# Patient Record
Sex: Male | Born: 1965 | Hispanic: Yes | Marital: Married | Smoking: Never smoker
Health system: Southern US, Community
[De-identification: ages and names within clinical notes are randomized; demographics above are authoritative.]

## PROBLEM LIST (undated history)

## (undated) DIAGNOSIS — I272 Pulmonary hypertension, unspecified: Secondary | ICD-10-CM

## (undated) DIAGNOSIS — E871 Hypo-osmolality and hyponatremia: Secondary | ICD-10-CM

## (undated) DIAGNOSIS — Z5111 Encounter for antineoplastic chemotherapy: Secondary | ICD-10-CM

## (undated) DIAGNOSIS — R918 Other nonspecific abnormal finding of lung field: Secondary | ICD-10-CM

## (undated) DIAGNOSIS — C419 Malignant neoplasm of bone and articular cartilage, unspecified: Secondary | ICD-10-CM

## (undated) DIAGNOSIS — I314 Cardiac tamponade: Secondary | ICD-10-CM

## (undated) DIAGNOSIS — Z923 Personal history of irradiation: Secondary | ICD-10-CM

## (undated) DIAGNOSIS — I313 Pericardial effusion (noninflammatory): Secondary | ICD-10-CM

## (undated) DIAGNOSIS — C3490 Malignant neoplasm of unspecified part of unspecified bronchus or lung: Secondary | ICD-10-CM

## (undated) DIAGNOSIS — E119 Type 2 diabetes mellitus without complications: Secondary | ICD-10-CM

## (undated) DIAGNOSIS — C7951 Secondary malignant neoplasm of bone: Secondary | ICD-10-CM

## (undated) DIAGNOSIS — E46 Unspecified protein-calorie malnutrition: Secondary | ICD-10-CM

## (undated) HISTORY — DX: Encounter for antineoplastic chemotherapy: Z51.11

---

## 2009-07-09 ENCOUNTER — Emergency Department (HOSPITAL_COMMUNITY): Admission: EM | Admit: 2009-07-09 | Discharge: 2009-07-09 | Payer: Self-pay | Admitting: Emergency Medicine

## 2014-10-13 ENCOUNTER — Ambulatory Visit (INDEPENDENT_AMBULATORY_CARE_PROVIDER_SITE_OTHER): Payer: Self-pay | Admitting: Internal Medicine

## 2014-10-13 ENCOUNTER — Encounter: Payer: Self-pay | Admitting: Internal Medicine

## 2014-10-13 ENCOUNTER — Other Ambulatory Visit: Payer: Self-pay

## 2014-10-13 VITALS — BP 100/58 | HR 64 | Temp 98.0°F | Ht 68.0 in | Wt 193.6 lb

## 2014-10-13 DIAGNOSIS — R918 Other nonspecific abnormal finding of lung field: Secondary | ICD-10-CM | POA: Insufficient documentation

## 2014-10-13 HISTORY — DX: Other nonspecific abnormal finding of lung field: R91.8

## 2014-10-13 NOTE — Progress Notes (Signed)
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    10/13/2014 1st [facility] office visit/ Steven Roberts   Chief Complaint  Patient presents with  . Pulmonary Consult    Referred by Dr. Herbert Moors. Pt c/o cough x 5 month- non prod. He went to ED at [facility] this am due to left side CP and was dxed with PNA. He also c/o SOB when he lies down.   onset of symptoms was insidious, pattern is progressive dry cough and then cp first post then ant on L assoc with about 15 lb wt loss  Prev neg resp to rx for gerd per outpt records   No obvious other patterns in day to day or daytime variabilty or assoc sob or chest tightness, subjective wheeze overt sinus or hb symptoms. No unusual exp hx or h/o childhood pna/ asthma or knowledge of premature birth.  Sleeping ok without nocturnal  or early am exacerbation  of respiratory  c/o's or need for noct saba. Also denies any obvious fluctuation of symptoms with weather or environmental changes or other aggravating or alleviating factors except as outlined above   Current Medications, Allergies, Complete Past Medical History, Past Surgical History, Family History, and Social History were reviewed in [employer] record.             Review of Systems  Constitutional: Negative for fever, chills, activity change, appetite change and unexpected weight change.  HENT: Positive for sore throat. Negative for congestion, dental problem, postnasal drip, rhinorrhea, sneezing, trouble swallowing and voice change.   Eyes: Negative for visual  disturbance.  Respiratory: Positive for cough and shortness of breath. Negative for choking.   Cardiovascular: Positive for chest pain. Negative for leg swelling.  Gastrointestinal: Negative for nausea, vomiting and abdominal pain.  Genitourinary: Negative for difficulty urinating.  Musculoskeletal: Negative for arthralgias.  Skin: Negative for rash.  Psychiatric/Behavioral: Negative for behavioral problems and confusion.       Objective:   Physical Exam   amb [place] male nad  Wt Readings from Last 3 Encounters:  10/13/14 193 lb 9.6 oz (87.816 kg)    Vital signs reviewed    HEENT: nl dentition, turbinates, and orophanx. Nl external ear canals without cough reflex   NECK :  without JVD/Nodes/TM/ nl carotid upstrokes bilaterally   LUNGS: no acc muscle use, clear to A and P bilaterally without cough on insp or exp maneuvers   CV:  RRR  no s3 or murmur or increase in P2, no edema   ABD:  soft and nontender with nl excursion in the supine position. No bruits or organomegaly, bowel sounds nl  MS:  warm without deformities, calf tenderness, cyanosis or clubbing  SKIN: warm and dry without lesions    NEURO:  alert, approp, no deficits     See pulmonary infiltrates for outside cxr / CT reports reviewed s discs avail at [facility]  Assessment & Plan:

## 2014-10-13 NOTE — Patient Instructions (Addendum)
Please remember to go to the lab  department downstairs for your tests - we will call you with the results when they are available.  Finish the zmax/ take the pain pill will also work as a cough medication   Return in one week with CD from [place]

## 2014-10-14 ENCOUNTER — Encounter: Payer: Self-pay | Admitting: Internal Medicine

## 2014-10-14 NOTE — Assessment & Plan Note (Addendum)
It is difficult to believe that he could have a neoplasm arise in an area where he had a normal chest x-ray in June of this year and he is a never smoker. My concern is that he is developed tuberculosis in the upper lobe and needs first to rule this out. Since he does not have any sputum production the best option therefore is to do a QuantiFERON Gold now and if positive refer to the health department. If not, proceed with bronchoscopy.  In the interim is certainly reasonable to suppress the cough and chest pain with narcotic containing cough medications  Discussed in detail all the  indications, usual  risks and alternatives  relative to the benefits with patient who agrees to proceed with w/u as outlined and need to wear mask in public until we sort this out

## 2014-10-15 LAB — QUANTIFERON TB GOLD ASSAY (BLOOD)
INTERFERON GAMMA RELEASE ASSAY: NEGATIVE
Mitogen value: >10 IU/mL
QUANTIFERON TB AG MINUS NIL: -0.01 [IU]/mL
Quantiferon Nil Value: 0.03 IU/mL
TB AG VALUE: 0.02 [IU]/mL

## 2014-10-16 NOTE — Progress Notes (Signed)
Quick Note:  LVM for pt to return call ______ 

## 2014-10-20 ENCOUNTER — Encounter: Payer: Self-pay | Admitting: Internal Medicine

## 2014-10-20 ENCOUNTER — Ambulatory Visit (INDEPENDENT_AMBULATORY_CARE_PROVIDER_SITE_OTHER)
Admission: RE | Admit: 2014-10-20 | Discharge: 2014-10-20 | Disposition: A | Discharge status: Home / Self Care | Payer: Self-pay | Source: Ambulatory Visit | Attending: Internal Medicine | Admitting: Internal Medicine

## 2014-10-20 ENCOUNTER — Ambulatory Visit (INDEPENDENT_AMBULATORY_CARE_PROVIDER_SITE_OTHER): Payer: Self-pay | Admitting: Internal Medicine

## 2014-10-20 DIAGNOSIS — R918 Other nonspecific abnormal finding of lung field: Secondary | ICD-10-CM

## 2014-10-20 MED ORDER — ACETAMINOPHEN-CODEINE #3 300-30 MG PO TABS: 1.0000 | ORAL_TABLET | ORAL | Status: DC | PRN

## 2014-10-20 NOTE — Assessment & Plan Note (Signed)
-   symptom onset march 2016 - Baseline cxr on July 20 2014 reported to be nl  > then CT chest 10/13/2014 LUL as dz  Assoc with sup LUL nodular density/ mild adenopathy - Quantiferon Gold 10/13/2014 > neg  Cough since 04/2014 with nl cxr 07/20/14 but now progressive vol loss LUL suggests endobronchial process now with obst of the LUL with cp worrisome for lung ca, though he has never smoked .  Discussed in detail all the  indications, usual  risks and alternatives  relative to the benefits with patient who agrees to proceed with bronchoscopy with biopsy as the next step.

## 2014-10-20 NOTE — Progress Notes (Signed)
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   History of Present Illness  10/13/2014 1st [name] office visit/ Wert   Chief Complaint  Patient presents with  . Pulmonary Consult    Referred by Dr. Herbert Moors. Pt c/o cough x 5 month- non prod. He went to ED at [facility] this am due to left side CP and was dxed with PNA. He also c/o SOB when he lies down.   onset of symptoms was insidious, pattern is progressive dry cough and then cp first post then ant on L assoc with about 15 lb wt loss Prev neg resp to rx for gerd per outpt records  Please remember to go to the lab  department [place] the zmax/ take the pain pill will also work as a cough medication  Return in one week with CD from [facility]    10/20/2014 f/u ov/Wert re: new L perihilar infiltrates since nl cxr 07/20/14  Chief Complaint  Patient presents with  . Follow-up    Pt states cough and CP are unchanged. He also still c/o HA.     Min sweats / cough is dry/ doe x across the parking lot. All better on tylenol #3   No obvious day to day or daytime variability or assoc   chest tightness, subjective wheeze or overt sinus or hb symptoms. No unusual exp hx or h/o childhood pna/ asthma or knowledge of premature birth.  Sleeping ok without nocturnal  or early am exacerbation  of respiratory  c/o's or need for noct saba. Also denies any obvious fluctuation of symptoms with weather or environmental changes or other aggravating or alleviating factors except as outlined above   Current  Medications, Allergies, Complete Past Medical History, Past Surgical History, Family History, and Social History were reviewed in [employer] record.  ROS  The following are not active complaints unless bolded sore throat, dysphagia, dental problems, itching, sneezing,  nasal congestion or excess/ purulent secretions, ear ache,   fever, chills, sweats, unintended wt loss, classically   exertional cp, hemoptysis,  orthopnea pnd or leg swelling, presyncope, palpitations, abdominal pain, anorexia, nausea, vomiting, diarrhea  or change in bowel or bladder habits, change in stools or urine, dysuria,hematuria,  rash, arthralgias, visual complaints, headache, numbness, weakness or ataxia or problems with walking or coordination,  change in mood/affect or memory.                                Objective:   Physical Exam   amb [place] male nad  Wt Readings from Last 3 Encounters:  10/20/14 189 lb (85.73 kg)  10/13/14 193 lb 9.6 oz (87.816 kg)    Vital signs reviewed    HEENT: nl dentition, turbinates, and orophanx. Nl external ear canals without cough reflex   NECK :  without JVD/Nodes/TM/ nl carotid upstrokes bilaterally  LUNGS: no acc muscle use, clear to A and P bilaterally without cough on insp or exp maneuvers   CV:  RRR  no s3 or murmur or increase in P2, no edema   ABD:  soft and nontender with nl excursion in the supine position. No bruits or organomegaly, bowel sounds nl  MS:  warm without deformities, calf tenderness, cyanosis or clubbing  SKIN: warm and dry without lesions    NEURO:  alert, approp, no deficits   CxR  07/20/14 completely nl per report   Outside CT reviewed 10/13/14 C/w L perihilar infitrates vs mass    CXR PA and Lateral:   10/20/2014 :     I personally reviewed images and agree with radiology impression as follows:    abnormal perihilar masslike density. There is surrounding atelectatic left upper lobe. The left lower  lobe is clear. The right lung is clear. The heart and pulmonary vascularity are normal. The trachea is midline. The bony thorax exhibits no acute abnormality.             Assessment & Plan:

## 2014-10-20 NOTE — Patient Instructions (Addendum)
For pain or cough take Tylenol #3 one every 4 hours as needed   Please remember to go to the   x-ray department downstairs for your tests - we will call you with the results when they are available.     Para el dolor o la tos tomar Tylenol # 3 uno cada 4 horas segn sea necesario  Por favor, recuerde que ir al departamento de rayos X de la planta [place] para las pruebas - nosotros le llamamos con los resultados cuando estn disponibles

## 2014-10-22 NOTE — H&P (Signed)
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   History of Present Illness  10/13/2014 1st [facility] office visit/ Jadence Kinlaw  Chief Complaint  Patient presents with  . Pulmonary Consult    Referred by Dr. Herbert Moors. Pt c/o cough x 5 month- non prod. He went to ED at [facility] this am due to left side CP and was dxed with PNA. He also c/o SOB when he lies down.   onset of symptoms was insidious, pattern is progressive dry cough and then cp first post then ant on L assoc with about 15 lb wt loss Prev neg resp to rx for gerd per outpt records  Please remember to go to the lab department [place] the zmax/ take the pain pill will also work as a cough medication  Return in one week with CD from [facility]    10/20/2014 f/u ov/Khaiden Segreto re: new L perihilar infiltrates since nl cxr 07/20/14  Chief Complaint  Patient presents with  . Follow-up    Pt states cough and CP are unchanged. He also still c/o HA.     Min sweats / cough is dry/ doe x across the parking lot. All better on tylenol #3   No obvious day to day or daytime variability or assoc chest tightness, subjective wheeze or overt sinus or hb symptoms. No unusual exp hx or h/o childhood pna/ asthma or knowledge of premature birth.  Sleeping ok without nocturnal or early am exacerbation of respiratory c/o's or need for noct saba. Also denies any obvious fluctuation of symptoms with weather or environmental changes or other aggravating or alleviating factors except as outlined above   Current Medications, Allergies, Complete Past Medical History, Past Surgical History,  Family History, and Social History were reviewed in [employer] record.  ROS The following are not active complaints unless bolded sore throat, dysphagia, dental problems, itching, sneezing, nasal congestion or excess/ purulent secretions, ear ache, fever, chills, sweats, unintended wt loss, classically exertional cp, hemoptysis, orthopnea pnd or leg swelling, presyncope, palpitations, abdominal pain, anorexia, nausea, vomiting, diarrhea or change in bowel or bladder habits, change in stools or urine, dysuria,hematuria, rash, arthralgias, visual complaints, headache, numbness, weakness or ataxia or problems with walking or coordination, change in mood/affect or memory.                    Objective:  Physical Exam   amb [place] male nad  Wt Readings from Last 3 Encounters:  10/20/14 189 lb (85.73 kg)  10/13/14 193 lb 9.6 oz (87.816 kg)    Vital signs reviewed    HEENT: nl dentition, turbinates, and orophanx. Nl external ear canals without cough reflex   NECK : without JVD/Nodes/TM/ nl carotid upstrokes bilaterally   LUNGS: no acc muscle use, clear to A and P bilaterally without cough on insp or exp maneuvers   CV: RRR no s3 or murmur or increase in P2, no edema   ABD: soft and nontender with nl excursion in the supine position. No bruits or organomegaly, bowel sounds nl  MS: warm without deformities, calf  tenderness, cyanosis or clubbing  SKIN: warm and dry without lesions   NEURO: alert, approp, no deficits   CxR 07/20/14 completely nl per report   Outside CT reviewed 10/13/14 C/w L perihilar infitrates vs mass    CXR PA and Lateral: 10/20/2014 :  I personally reviewed images and agree with radiology impression as follows:  abnormal perihilar masslike density. There is surrounding atelectatic left upper lobe. The left lower lobe is clear. The right lung is clear. The heart and pulmonary  vascularity are normal. The trachea is midline. The bony thorax exhibits no acute abnormality.         Assessment & Plan:              Pulmonary infiltrates - Tanda Rockers, MD at 10/20/2014 8:48 PM     Status: Written Related Problem: Pulmonary infiltrates   Expand All Collapse All   - symptom onset march 2016 - Baseline cxr on July 20 2014 reported to be nl > then CT chest 10/13/2014 LUL as dz Assoc with sup LUL nodular density/ mild adenopathy - Quantiferon Gold 10/13/2014 > neg  Cough since 04/2014 with nl cxr 07/20/14 but now progressive vol loss LUL suggests endobronchial process now with obst of the LUL with cp worrisome for lung ca, though he has never smoked .  Discussed in detail all the indications, usual risks and alternatives relative to the benefits with patient who agrees to proceed with bronchoscopy with biopsy as the next step.          10/23/2014   Day of fob/ no change hx or exam   Christinia Gully, MD Pulmonary and [place] [phone] After 5:30 PM or weekends, call [phone]

## 2014-10-22 NOTE — Progress Notes (Signed)
Quick Note:  Called and spoke with pt. Reviewed results and recs. Pt voiced understanding and had no further question. ______

## 2014-10-23 ENCOUNTER — Ambulatory Visit (HOSPITAL_COMMUNITY)
Admission: RE | Admit: 2014-10-23 | Discharge: 2014-10-23 | Disposition: A | Discharge status: Home / Self Care | Payer: Self-pay | Source: Ambulatory Visit | Attending: Internal Medicine | Admitting: Internal Medicine

## 2014-10-23 ENCOUNTER — Other Ambulatory Visit: Payer: Self-pay

## 2014-10-23 ENCOUNTER — Encounter (HOSPITAL_COMMUNITY)
Admission: RE | Disposition: A | Discharge status: Home / Self Care | Payer: Self-pay | Source: Ambulatory Visit | Attending: Internal Medicine

## 2014-10-23 DIAGNOSIS — R918 Other nonspecific abnormal finding of lung field: Secondary | ICD-10-CM | POA: Diagnosis present

## 2014-10-23 DIAGNOSIS — C3490 Malignant neoplasm of unspecified part of unspecified bronchus or lung: Secondary | ICD-10-CM

## 2014-10-23 DIAGNOSIS — C3412 Malignant neoplasm of upper lobe, left bronchus or lung: Secondary | ICD-10-CM | POA: Insufficient documentation

## 2014-10-23 DIAGNOSIS — R0602 Shortness of breath: Secondary | ICD-10-CM

## 2014-10-23 HISTORY — DX: Malignant neoplasm of unspecified part of unspecified bronchus or lung: C34.90

## 2014-10-23 HISTORY — PX: VIDEO BRONCHOSCOPY: SHX5072

## 2014-10-23 LAB — GLUCOSE, CAPILLARY: GLUCOSE-CAPILLARY: 145 mg/dL — AB (ref 65–99)

## 2014-10-23 SURGERY — VIDEO BRONCHOSCOPY WITHOUT FLUORO
Anesthesia: Moderate Sedation | Laterality: Bilateral

## 2014-10-23 MED ORDER — MEPERIDINE HCL 25 MG/ML IJ SOLN
INTRAMUSCULAR | Status: DC | PRN
  Administered 2014-10-23 (×2): 50 mg via INTRAVENOUS

## 2014-10-23 MED ORDER — MIDAZOLAM HCL 10 MG/2ML IJ SOLN
INTRAMUSCULAR | Status: DC | PRN
  Administered 2014-10-23: 5 mg via INTRAVENOUS
  Administered 2014-10-23 (×2): 2.5 mg via INTRAVENOUS

## 2014-10-23 MED ORDER — SODIUM CHLORIDE 0.9 % IV SOLN
INTRAVENOUS | Status: DC
  Administered 2014-10-23: 08:00:00 via INTRAVENOUS

## 2014-10-23 MED ORDER — MEPERIDINE HCL 100 MG/ML IJ SOLN
INTRAMUSCULAR | Status: AC
  Filled 2014-10-23: qty 2

## 2014-10-23 MED ORDER — MEPERIDINE HCL 100 MG/ML IJ SOLN: 100.0000 mg | Freq: Once | INTRAMUSCULAR | Status: DC

## 2014-10-23 MED ORDER — LIDOCAINE HCL 2 % EX GEL
CUTANEOUS | Status: DC | PRN
  Administered 2014-10-23: 1

## 2014-10-23 MED ORDER — NALOXONE HCL 0.4 MG/ML IJ SOLN
INTRAMUSCULAR | Status: DC | PRN
  Administered 2014-10-23: 0.4 mg via INTRAVENOUS

## 2014-10-23 MED ORDER — PHENYLEPHRINE HCL 0.25 % NA SOLN
NASAL | Status: DC | PRN
Start: 2014-10-23 — End: 2014-10-23
  Administered 2014-10-23: 2 via NASAL

## 2014-10-23 MED ORDER — LIDOCAINE HCL 1 % IJ SOLN
INTRAMUSCULAR | Status: DC | PRN
  Administered 2014-10-23: 6 mL via RESPIRATORY_TRACT

## 2014-10-23 MED ORDER — MIDAZOLAM HCL 5 MG/ML IJ SOLN: 1.0000 mg | Freq: Once | INTRAMUSCULAR | Status: DC

## 2014-10-23 MED ORDER — LIDOCAINE HCL 2 % EX GEL: 1.0000 "application " | Freq: Once | CUTANEOUS | Status: DC

## 2014-10-23 MED ORDER — PHENYLEPHRINE HCL 0.25 % NA SOLN: 1.0000 | Freq: Four times a day (QID) | NASAL | Status: DC | PRN

## 2014-10-23 MED ORDER — MIDAZOLAM HCL 5 MG/ML IJ SOLN
INTRAMUSCULAR | Status: AC
  Filled 2014-10-23: qty 2

## 2014-10-23 NOTE — Progress Notes (Signed)
Video bronchoscopy performed Intervention bronchial washings Intervention bronchial biopsies  Kathie Dike RRT

## 2014-10-23 NOTE — Op Note (Signed)
Given narcan p procedure for desats assoc with upper airway obst requiring jaw lift

## 2014-10-23 NOTE — Progress Notes (Signed)
Following bronchoscopy pt begin obstructing airway at [phone]. SpO2 fell to 45% on 4L [place]. Jaw lift performed & 100% NRB placed on pt. SpO2 began to improve, but pt was still unarousable. Dr Melvyn Novas present in the room made decision to reverse with Narcan. Narcan given at [phone] & pt began to maintain SpO2 96% on his own, but still not very arousable. Dr Melvyn Novas already gone to office was called about pt status & questioned if pt needed Romazicon as well. Dr Elsworth Soho was called by Dr Melvyn Novas to come assess. Dr Elsworth Soho arrived at 0904 & decided Romazicon not needed as pt was coughing well & maintaining SpO2. O2 slowly weaned down to RA. At 0930 pt much more arousable & transported to Endo for recovery in stable condition

## 2014-10-23 NOTE — Discharge Instructions (Signed)
Broncoscopia flexible: cuidados posteriores (Flexible Bronchoscopy, Care After) Estas indicaciones le proporcionan informacin general acerca de cmo deber cuidarse despus del procedimiento. El mdico tambin podr darle instrucciones especficas. Comunquese con el mdico si tiene algn problema o tiene preguntas despus del procedimiento. QU ESPERAR DESPUS DEL PROCEDIMIENTO Es normal tener ms tos o ronquera despus del procedimiento. CUIDADOS EN EL HOGAR  No coma ni beba nada durante las 2horas posteriores al procedimiento. Si trata de comer o beber antes de que desaparezca ese efecto, la comida o la bebida podran pasar a los pulmones. Tambin podra quemarse.  Despus de que hayan transcurrido 2horas y cuando pueda toser y sienta nuseas normalmente, podr comer alimentos blandos y beber lquido lentamente.  El da despus del estudio, podr retomar su dieta habitual.  Podr hacer sus actividades habituales.  Cumpla con los controles mdicos segn las indicaciones. SOLICITE AYUDA DE INMEDIATO SI:  La dificultad para respirar es [place].  Se siente mareado.  Siente como si se fuera a desmayar.  Siente dolor en el pecho.  Experimenta problemas nuevos que lo preocupan.  Escupe ms que una pequea cantidad de sangre al toser.  Escupe ms sangre que antes al toser. ASEGRESE DE QUE:  Comprende estas instrucciones.  Controlar su afeccin.  Recibir ayuda de inmediato si no mejora o si empeora. Document Released: 03/04/2010 Document Revised: 11/20/2012 [facility] Patient Information 2015 [place]. This information is not intended to replace advice given to you by your health care provider. Make sure you discuss any questions you have with your health care provider.  Do not eat or drink until after 1100 today 10/23/2014

## 2014-10-23 NOTE — Op Note (Signed)
Bronchoscopy Procedure Note  Date of Operation: 10/23/2014   Pre-op Diagnosis: pulmonary infiltrates  Post-op Diagnosis: same  Surgeon: Christinia Gully  Anesthesia: Monitored Local Anesthesia with Sedation  Operation: Video Flexible fiberoptic bronchoscopy, diagnostic   Findings: severe cobblestoning and swelling of LUL orifices esp beyond the lingula  Specimen: washings/endobronchial bx's  Estimated Blood Loss: min  Complications:severe coughing requiring 100 demerol/10 mg IV versed to complete the procedure and 02 requirements post   Indications and History: See updated H and P same date. The risks, benefits, complications, treatment options and expected outcomes were discussed with the patient.  The possibilities of reaction to medication, pulmonary aspiration, perforation of a viscus, bleeding, failure to diagnose a condition and creating a complication requiring transfusion or operation were discussed with the patient who freely signed the consent.    Description of Procedure: The patient was re-examined in the bronchoscopy suite and the site of surgery properly noted/marked.  The patient was identified  and the procedure verified as Flexible Fiberoptic Bronchoscopy.  A Time Out was held and the above information confirmed.   After the induction of topical nasopharyngeal anesthesia, the patient was positioned  and the bronchoscope was passed through the R naris. The vocal cords were visualized and  1% buffered lidocaine 5 ml was topically placed onto the cords. The cords were nl . The scope was then passed into the trachea.  1% buffered lidocaine given topically. Airways inspected bilaterally to the subsegmental level with the following findings:  Airways nl except for  severe cobblestoning and swelling of LUL orifices esp beyond the lingula   Interventions:  Washings rul Endobronchial bx x 6      The Patient was taken to the Endoscopy Recovery area in satisfactory  condition.  Attestation: I performed the procedure.  Christinia Gully, MD Pulmonary and [place] [phone] After 5:30 PM or weekends, call [phone]

## 2014-10-26 ENCOUNTER — Telehealth: Payer: Self-pay | Admitting: Internal Medicine

## 2014-10-26 DIAGNOSIS — C3490 Malignant neoplasm of unspecified part of unspecified bronchus or lung: Secondary | ICD-10-CM

## 2014-10-26 MED ORDER — HYDROCODONE-ACETAMINOPHEN 5-325 MG PO TABS: 1.0000 | ORAL_TABLET | ORAL | Status: DC | PRN

## 2014-10-26 NOTE — Telephone Encounter (Signed)
I scheduled the PET scan for 9/20 and scheduled him with MW for 9/21. Pt's wife is aware of both appointments.

## 2014-10-26 NOTE — Telephone Encounter (Signed)
vicodin 5 1-2 q 4h prn cough or pain #40  Needs PET scan dx adenoca lung and f/u with me one day later

## 2014-10-26 NOTE — Telephone Encounter (Signed)
Per 10/20/14 OV: Patient Instructions       For pain or cough take Tylenol #3 one every 4 hours as needed   ---  Called spoke with spouse Marcela. She reports pt took Tylenol #3 and had rash on neck. She though it was b/c pt was sweating. Pt then took this again on Thursday and then Friday night. Then the worse showed up on neck/arm/stomach. Pt stopped the medication. Pt is still coughing a lot and is worse from visit. Please advise MW thanks

## 2014-10-26 NOTE — Telephone Encounter (Signed)
Called and spoke to pt's wife. Informed her of the recs per Dr. Melvyn Novas. Rx placed on Dr. Gustavus Bryant desk to be signed then will need to be placed up front for pick up. Will forward to [place] to follow as this is a controlled medication.   PCC's please advise when PET scan is scheduled, pt needs f/u OV with MW.

## 2014-10-26 NOTE — Telephone Encounter (Signed)
Rx signed and placed up front for pick up. Nothing further needed at this time.

## 2014-10-27 ENCOUNTER — Encounter (HOSPITAL_COMMUNITY): Payer: Self-pay | Admitting: Internal Medicine

## 2014-10-29 ENCOUNTER — Telehealth: Payer: Self-pay | Admitting: Acute Care

## 2014-10-29 NOTE — Telephone Encounter (Signed)
Opened in error

## 2014-10-30 ENCOUNTER — Encounter (HOSPITAL_COMMUNITY): Payer: Self-pay

## 2014-11-02 ENCOUNTER — Encounter (HOSPITAL_COMMUNITY): Payer: Self-pay

## 2014-11-03 ENCOUNTER — Ambulatory Visit (HOSPITAL_COMMUNITY)
Admission: RE | Admit: 2014-11-03 | Discharge: 2014-11-03 | Disposition: A | Discharge status: Home / Self Care | Payer: Self-pay | Source: Ambulatory Visit | Attending: Internal Medicine | Admitting: Internal Medicine

## 2014-11-03 DIAGNOSIS — R918 Other nonspecific abnormal finding of lung field: Secondary | ICD-10-CM

## 2014-11-03 DIAGNOSIS — R591 Generalized enlarged lymph nodes: Secondary | ICD-10-CM | POA: Insufficient documentation

## 2014-11-03 DIAGNOSIS — I313 Pericardial effusion (noninflammatory): Secondary | ICD-10-CM | POA: Insufficient documentation

## 2014-11-03 DIAGNOSIS — C3412 Malignant neoplasm of upper lobe, left bronchus or lung: Secondary | ICD-10-CM | POA: Insufficient documentation

## 2014-11-03 DIAGNOSIS — C3490 Malignant neoplasm of unspecified part of unspecified bronchus or lung: Secondary | ICD-10-CM

## 2014-11-03 DIAGNOSIS — M899 Disorder of bone, unspecified: Secondary | ICD-10-CM | POA: Insufficient documentation

## 2014-11-03 LAB — GLUCOSE, CAPILLARY: Glucose-Capillary: 151 mg/dL — ABNORMAL HIGH (ref 65–99)

## 2014-11-03 MED ORDER — FLUDEOXYGLUCOSE F - 18 (FDG) INJECTION
9.4200 | Freq: Once | INTRAVENOUS | Status: DC | PRN
  Administered 2014-11-03: 9.42 via INTRAVENOUS
  Filled 2014-11-03: qty 9.42

## 2014-11-04 ENCOUNTER — Ambulatory Visit: Payer: Self-pay | Admitting: Internal Medicine

## 2014-11-04 ENCOUNTER — Other Ambulatory Visit: Payer: Self-pay | Admitting: Internal Medicine

## 2014-11-04 DIAGNOSIS — C801 Malignant (primary) neoplasm, unspecified: Secondary | ICD-10-CM

## 2014-11-04 MED ORDER — ACETAMINOPHEN-CODEINE #3 300-30 MG PO TABS: 1.0000 | ORAL_TABLET | ORAL | Status: DC | PRN

## 2014-11-05 ENCOUNTER — Other Ambulatory Visit: Payer: Self-pay | Admitting: Internal Medicine

## 2014-11-05 ENCOUNTER — Encounter: Payer: Self-pay | Admitting: *Deleted

## 2014-11-05 ENCOUNTER — Telehealth: Payer: Self-pay | Admitting: Internal Medicine

## 2014-11-05 DIAGNOSIS — R918 Other nonspecific abnormal finding of lung field: Secondary | ICD-10-CM

## 2014-11-05 NOTE — Progress Notes (Signed)
Oncology Nurse Navigator Documentation  Oncology Nurse Navigator Flowsheets 11/05/2014  Referral date to RadOnc/MedOnc 11/05/2014  Navigator Encounter Type Other  Interventions Coordination of Care/I received a referral today from Dr. Gustavus Bryant office.  I asked Tiffany to schedule next week 11/12/14, labs ordered.   Coordination of Care MD Appointments  Time Spent with Patient 15

## 2014-11-05 NOTE — Telephone Encounter (Signed)
New patient appt-s/w patient wife and gave np appt for 09/29 @ 12:45 w/Dr. Julien Nordmann

## 2014-11-12 ENCOUNTER — Encounter: Payer: Self-pay | Admitting: Internal Medicine

## 2014-11-12 ENCOUNTER — Telehealth: Payer: Self-pay | Admitting: Internal Medicine

## 2014-11-12 ENCOUNTER — Other Ambulatory Visit (HOSPITAL_BASED_OUTPATIENT_CLINIC_OR_DEPARTMENT_OTHER): Payer: Self-pay

## 2014-11-12 ENCOUNTER — Ambulatory Visit (HOSPITAL_BASED_OUTPATIENT_CLINIC_OR_DEPARTMENT_OTHER): Payer: Self-pay | Admitting: Internal Medicine

## 2014-11-12 ENCOUNTER — Ambulatory Visit: Payer: Self-pay

## 2014-11-12 VITALS — BP 116/67 | HR 92 | Temp 98.8°F | Resp 16 | Ht 68.0 in | Wt 180.7 lb

## 2014-11-12 DIAGNOSIS — C3492 Malignant neoplasm of unspecified part of left bronchus or lung: Secondary | ICD-10-CM

## 2014-11-12 DIAGNOSIS — R918 Other nonspecific abnormal finding of lung field: Secondary | ICD-10-CM

## 2014-11-12 DIAGNOSIS — C3490 Malignant neoplasm of unspecified part of unspecified bronchus or lung: Secondary | ICD-10-CM

## 2014-11-12 DIAGNOSIS — C3412 Malignant neoplasm of upper lobe, left bronchus or lung: Secondary | ICD-10-CM | POA: Insufficient documentation

## 2014-11-12 HISTORY — DX: Malignant neoplasm of unspecified part of unspecified bronchus or lung: C34.90

## 2014-11-12 LAB — CBC WITH DIFFERENTIAL/PLATELET
BASO%: 0.5 % (ref 0.0–2.0)
BASOS ABS: 0 10*3/uL (ref 0.0–0.1)
EOS ABS: 0.2 10*3/uL (ref 0.0–0.5)
EOS%: 1.8 % (ref 0.0–7.0)
HEMATOCRIT: 38.6 % (ref 38.4–49.9)
HEMOGLOBIN: 12.8 g/dL — AB (ref 13.0–17.1)
LYMPH#: 1.2 10*3/uL (ref 0.9–3.3)
LYMPH%: 13.6 % — ABNORMAL LOW (ref 14.0–49.0)
MCH: 27.7 pg (ref 27.2–33.4)
MCHC: 33.1 g/dL (ref 32.0–36.0)
MCV: 83.6 fL (ref 79.3–98.0)
MONO#: 0.8 10*3/uL (ref 0.1–0.9)
MONO%: 9.3 % (ref 0.0–14.0)
NEUT#: 6.8 10*3/uL — ABNORMAL HIGH (ref 1.5–6.5)
NEUT%: 74.8 % (ref 39.0–75.0)
Platelets: 429 10*3/uL — ABNORMAL HIGH (ref 140–400)
RBC: 4.62 10*6/uL (ref 4.20–5.82)
RDW: 12.9 % (ref 11.0–14.6)
WBC: 9 10*3/uL (ref 4.0–10.3)

## 2014-11-12 LAB — COMPREHENSIVE METABOLIC PANEL (CC13)
ALBUMIN: 3 g/dL — AB (ref 3.5–5.0)
ALK PHOS: 119 U/L (ref 40–150)
ALT: 29 U/L (ref 0–55)
AST: 18 U/L (ref 5–34)
Anion Gap: 8 mEq/L (ref 3–11)
BILIRUBIN TOTAL: 0.48 mg/dL (ref 0.20–1.20)
BUN: 11 mg/dL (ref 7.0–26.0)
CALCIUM: 9.6 mg/dL (ref 8.4–10.4)
CO2: 28 mEq/L (ref 22–29)
Chloride: 100 mEq/L (ref 98–109)
Creatinine: 0.9 mg/dL (ref 0.7–1.3)
EGFR: >90 mL/min/{1.73_m2} (ref >90)
Glucose: 296 mg/dl — ABNORMAL HIGH (ref 70–140)
POTASSIUM: 3.9 meq/L (ref 3.5–5.1)
Sodium: 136 mEq/L (ref 136–145)
TOTAL PROTEIN: 7.3 g/dL (ref 6.4–8.3)

## 2014-11-12 MED ORDER — ERLOTINIB HCL 150 MG PO TABS: 150.0000 mg | ORAL_TABLET | Freq: Every day | ORAL | Status: DC

## 2014-11-12 NOTE — Telephone Encounter (Signed)
Pt confirmed labs/ov per 09/29 POF, gave pt AVS and Calendar.Cherylann Banas, scheduled for chemo edu, gave information for Dr. Enrique Sack and sent msg to have radiation to call to schedule.Marland KitchenMarland Kitchen

## 2014-11-12 NOTE — Progress Notes (Signed)
[facility] Telephone:(336) [phone]   Fax:(336) [phone]  CONSULT NOTE  REFERRING PHYSICIAN: Dr. Christinia Gully  REASON FOR CONSULTATION:  49 years old Hispanic male recently diagnosed with lung cancer.  HPI Steven Roberts is a 49 y.o. male a never smoker with no significant past medical history. The patient mentioned that 6 months ago he started having persistent dry cough. He was seen by his primary care physician for evaluation. He was treated for questionable pneumonia with a course of antibiotics with minimal improvement. CT scan of the chest performed at that time showed left upper lobe lung mass with mediastinal lymphadenopathy. He was referred to Dr. Melvyn Novas for evaluation of his persistent cough and left-sided chest pain. Chest x-ray was performed on 10/20/2014 and it showed findings worrisome for a left hilar or suprahilar mass with postobstructive atelectatic changes in the left upper lobe. On 10/23/2014 the patient underwent a video flexible fiberoptic bronchoscopy under the care of Dr. Melvyn Novas with washing and endobronchial biopsy of the left upper lobe. The final pathology (Accession: [id number]) showed adenocarcinoma. The molecular study showed positive EGFR mutation (L858R) in exon 21 but negative for ALK gene translocation and ROS 1. A PET scan performed on 11/03/2014 showed the dominant left upper lobe mass and extensive adenopathy involving the mediastinum, hilar and supraclavicular stations are oral hypermetabolic consistent with metastatic disease. There was a dominant sub-solid left lower lobe nodule that was hypermetabolic questionable for inflammatory artery second focus of adenocarcinoma. There was numerous small pulmonary nodules bilaterally too small to characterize with PET. There was suspected osseous metastases in the left sixth rib, the right acetabulum and right femoral neck. When seen today the patient continues to complain of the dry cough as well as pain on  the left side of the chest and right hip area. He lost around 30 pounds over the last 3 months. He also complains of persistent headache and shortness of breath with exertion. He denied having any significant nausea or vomiting except after severe cough. Family history significant for a father who diagnosed with prostate cancer in his 59s, mother still alive at age 72 has diabetes mellitus and hypertension. The patient is married and has one son. He was accompanied by his wife Steven Roberts. 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 He has no history of smoking, alcohol or drug abuse.  HPI  History reviewed. No pertinent past medical history.  Past Surgical History  Procedure Laterality Date  . Video bronchoscopy Bilateral 10/23/2014    Procedure: VIDEO BRONCHOSCOPY WITHOUT FLUORO;  Surgeon: Tanda Rockers, MD;  Location: [facility];  Service: Cardiopulmonary;  Laterality: Bilateral;    History reviewed. No pertinent family history.  Social History Social History  Substance Use Topics  . Smoking status: Never Smoker   . Smokeless tobacco: Never Used  . Alcohol Use: No    No Known Allergies  Current Outpatient Prescriptions  Medication Sig Dispense Refill  . acetaminophen-codeine (TYLENOL #3) 300-30 MG per tablet TAKE ONE TABLET BY MOUTH EVERY 4 HOURS AS NEEDED FOR MODERATE PAIN 40 tablet 0  . HYDROcodone-acetaminophen (NORCO/VICODIN) 5-325 MG per tablet Take 1-2 tablets by mouth every 4 (four) hours as needed for moderate pain (or cough). 40 tablet 0  . ibuprofen (ADVIL,MOTRIN) 200 MG tablet Take 200 mg by mouth every 6 (six) hours as needed.     No current facility-administered medications for this visit.  Review of Systems  Constitutional: positive for anorexia, fatigue and weight loss Eyes: negative Ears, nose, mouth, throat, and face: negative Respiratory: positive for cough, dyspnea on exertion and  pleurisy/chest pain Cardiovascular: negative Gastrointestinal: negative Genitourinary:negative Integument/breast: negative Hematologic/lymphatic: negative Musculoskeletal:positive for bone pain Neurological: positive for headaches Behavioral/Psych: negative Endocrine: negative Allergic/Immunologic: negative  Physical Exam  PJA:SNKNL, healthy, no distress, well nourished and well developed SKIN: skin color, texture, turgor are normal, no rashes or significant lesions HEAD: Normocephalic, No masses, lesions, tenderness or abnormalities EYES: normal, PERRLA, Conjunctiva are pink and non-injected EARS: External ears normal, Canals clear OROPHARYNX:no exudate, no erythema and lips, buccal mucosa, and tongue normal  NECK: supple, no adenopathy, no JVD LYMPH:  no palpable lymphadenopathy, no hepatosplenomegaly LUNGS: expiratory wheezes bilaterally, scattered rales bilaterally HEART: regular rate & rhythm, no murmurs and no gallops ABDOMEN:abdomen soft, non-tender, normal bowel sounds and no masses or organomegaly BACK: Back symmetric, no curvature., No CVA tenderness EXTREMITIES:no joint deformities, effusion, or inflammation, no edema, no skin discoloration  NEURO: alert & oriented x 3 with fluent speech, no focal motor/sensory deficits  PERFORMANCE STATUS: ECOG 1  LABORATORY DATA: Lab Results  Component Value Date   WBC 9.0 11/12/2014   HGB 12.8* 11/12/2014   HCT 38.6 11/12/2014   MCV 83.6 11/12/2014   PLT 429* 11/12/2014      Chemistry      Component Value Date/Time   NA 136 11/12/2014 1241   K 3.9 11/12/2014 1241   CO2 28 11/12/2014 1241   BUN 11.0 11/12/2014 1241   CREATININE 0.9 11/12/2014 1241      Component Value Date/Time   CALCIUM 9.6 11/12/2014 1241   ALKPHOS 119 11/12/2014 1241   AST 18 11/12/2014 1241   ALT 29 11/12/2014 1241   BILITOT 0.48 11/12/2014 1241       RADIOGRAPHIC STUDIES: Dg Chest 2 View  10/20/2014   CLINICAL DATA:  Nonproductive cough  since March of 2016, shortness of breath, left-sided chest pain  EXAM: CHEST  2 VIEW  COMPARISON:  None in PACs  FINDINGS: There is an abnormal perihilar masslike density. There is surrounding atelectatic left upper lobe. The left lower lobe is clear. The right lung is clear. The heart and pulmonary vascularity are normal. The trachea is midline. The bony thorax exhibits no acute abnormality.  IMPRESSION: Findings worrisome for a left hilar or suprahilar mass with postobstructive atelectatic change in the left upper lobe. Uncomplicated pneumonia is felt less likely. Chest CT scanning now is recommended.  These results will be called to the ordering clinician or representative by the Radiologist Assistant, and communication documented in the PACS or zVision Dashboard.   Electronically Signed   By: David  Martinique M.D.   On: 10/20/2014 16:21   Nm Pet Image Initial (pi) Skull Base To Thigh  11/03/2014   CLINICAL DATA:  Initial treatment strategy for left upper lobe adenocarcinoma.  EXAM: NUCLEAR MEDICINE PET SKULL BASE TO THIGH  TECHNIQUE: 9.42 mCi F-18 FDG was injected intravenously. Full-ring PET imaging was performed from the skull base to thigh after the radiotracer. CT data was obtained and used for attenuation correction and anatomic localization.  FASTING BLOOD GLUCOSE:  Value: 151 mg/dl  COMPARISON:  Outside chest CT 10/23/2014-no report. Chest radiographs 10/20/2014.  FINDINGS: NECK  There are bilateral hypermetabolic enlarged level 4 lymph nodes bilaterally. On the left, these measure up to 2.4 cm on image 49 and have an SUV max of 15.2. No hypermetabolic upper cervical lymph nodes identified.  There is mildly prominent activity within the lymphoid tissue of Waldeyer's ring and the soft palate, but no definite focal lesions of the pharyngeal mucosal space.  CHEST  There is extensive hypermetabolic mediastinal and hilar adenopathy bilaterally. Approximately 1.6 cm subcarinal node has an SUV max of 9.1. The  large central left upper lobe mass is also hypermetabolic with an SUV max of 14.6. This hypermetabolic activity is fairly homogeneous without definite necrosis. A 2.0 x 1.3 cm sub solid left lower lobe nodule on image 45 is hypermetabolic with an SUV max of 4.8. There are several other small pulmonary nodules bilaterally, too small to characterize with PET. There is a small pericardial effusion without abnormal metabolic activity. No significant pleural effusion.  ABDOMEN/PELVIS  There is no hypermetabolic activity within the liver, adrenal glands, spleen or pancreas. There is no hypermetabolic nodal activity.  SKELETON  There is a small lucent hypermetabolic lesion laterally in the left sixth rib, demonstrating an SUV max of 5.2, suspicious for metastatic disease. There is also prominent metabolic activity around the right hip (SUV max 10.5), primarily within the acetabulum, but also within the anterior aspect of the greater trochanter. Some of this activity could be synovial although there appears to be a small lucent lesion in the femoral neck. No focal lesion identified within the acetabulum on the CT images.  IMPRESSION: 1. The dominant left upper lobe mass and extensive adenopathy involving the mediastinum, hilar and supraclavicular stations are all hypermetabolic consistent with metastatic disease. 2. Dominant sub solid left lower lobe nodule is hypermetabolic, and not clearly seen on recent outside CT, probably inflammatory rather than a second focus of adenocarcinoma. 3. Numerous small pulmonary nodules bilaterally, too small to characterize with PET, although suspicious for metastatic disease. 4. Suspected osseous metastases in the left 6th rib, the right acetabulum and right femoral neck. The activity around the right hip is somewhat atypical and potentially stress mediated or secondary to synovitis. This activity could be more definitively characterized with MRI to include post-contrast imaging if  clinically warranted.   Electronically Signed   By: Richardean Sale M.D.   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    PLAN: I had a lengthy discussion with the patient and his wife today about his current disease stage, prognosis and treatment options. I will complete the staging workup by ordering a MRI of the brain to rule out brain metastasis. I reviewed the imaging studies with the patient and his wife. I discussed with the patient has treatment options including palliative care and hospice referral versus consideration of treatment with targeted therapy with single agent Tarceva as the patient has positive EGFR mutation in exon 21 versus systemic chemotherapy. After discussion of further options, the patient is interested in proceeding with treatment with the targeted therapy. He will be treated with Tarceva 150 mg by mouth daily. I discussed with the patient the adverse effect of this treatment including but not limited to skin rash, diarrhea, interstitial lung disease, liver, or renal dysfunction. The patient does not have insurance coverage for medication. We will send a prescription for assistant program to help him with his medication. For  the metastatic bone disease, I would consider the patient for treatment with Xgeva. I referred him for dental evaluation before starting this treatment. For pain management, I referred the patient to radiation oncology for consideration of palliative radiotherapy to the metastatic bone lesions. He will continue on Vicodin for pain management. The patient would come back for  follow-up visit in 3 weeks for reevaluation and repeat blood work. He was advised to call immediately if he has any concerning symptoms in the interval. I gave the patient and his wife the time to ask questions and I answered them completely to their satisfaction. The patient voices understanding of current disease status and treatment options and is in agreement with the current care plan.  All questions were answered. The patient knows to call the clinic with any problems, questions or concerns. We can certainly see the patient much sooner if necessary.  Thank you so much for allowing me to participate in the care of Deliah Boston. I will continue to follow up the patient with you and assist in his care.  I spent 55 minutes counseling the patient face to face. The total time spent in the appointment was 80 minutes.  Disclaimer: This note was dictated with voice recognition software. Similar sounding words can inadvertently be transcribed and may not be corrected upon review.   MOHAMED,MOHAMED K. November 12, 2014, 1:53 PM

## 2014-11-12 NOTE — Progress Notes (Signed)
Applied to Steven Roberts for assistance for [name].  Faxed application today, waiting for approval.

## 2014-11-17 ENCOUNTER — Encounter: Payer: Self-pay | Admitting: Internal Medicine

## 2014-11-17 NOTE — Progress Notes (Signed)
Called [employer] to check status on pt's application.  Rep mailed pt a form to fill out.  Once they receive it they will continue processing the application.

## 2014-11-18 ENCOUNTER — Encounter (HOSPITAL_COMMUNITY): Payer: Self-pay | Admitting: Dentistry

## 2014-11-18 ENCOUNTER — Other Ambulatory Visit: Payer: Self-pay | Admitting: Medical Oncology

## 2014-11-18 ENCOUNTER — Ambulatory Visit (HOSPITAL_COMMUNITY): Payer: Self-pay | Admitting: Dentistry

## 2014-11-18 ENCOUNTER — Telehealth: Payer: Self-pay | Admitting: Internal Medicine

## 2014-11-18 VITALS — BP 115/58 | HR 76 | Temp 98.6°F

## 2014-11-18 DIAGNOSIS — K08409 Partial loss of teeth, unspecified cause, unspecified class: Secondary | ICD-10-CM

## 2014-11-18 DIAGNOSIS — K036 Deposits [accretions] on teeth: Secondary | ICD-10-CM

## 2014-11-18 DIAGNOSIS — M264 Malocclusion, unspecified: Secondary | ICD-10-CM

## 2014-11-18 DIAGNOSIS — K0530 Chronic periodontitis, unspecified: Secondary | ICD-10-CM

## 2014-11-18 DIAGNOSIS — Z01818 Encounter for other preprocedural examination: Secondary | ICD-10-CM

## 2014-11-18 DIAGNOSIS — C3492 Malignant neoplasm of unspecified part of left bronchus or lung: Secondary | ICD-10-CM

## 2014-11-18 MED ORDER — HYDROCODONE-ACETAMINOPHEN 5-325 MG PO TABS: 1.0000 | ORAL_TABLET | ORAL | Status: DC | PRN

## 2014-11-18 NOTE — Progress Notes (Signed)
DENTAL CONSULTATION  Date of Consultation:  11/18/2014 Patient Name:   Steven Roberts Date of Birth:   [birth year] Medical Record Number: [id number]  VITALS: BP 115/58 mmHg  Pulse 76  Temp(Src) 98.6 F (37 C) (Oral)  CHIEF COMPLAINT: Patient referred by Dr. Julien Nordmann for a medically necessary Pre-Xgeva dental consultation.  HPI: Steven Roberts is a 49 year old male recently diagnosed with non-small cell lung cancer. Patient with anticipated Tarceva therapy along with possible Xgeva therapy for the bony metastases. The patient is now seen as part of a medically necessary pre-Xgeva dental protocol examination.  The patient currently denies acute toothaches, swellings, or abscesses. The patient was last seen by a dentist in [place] 5-6 years ago. Patient had several fillings placed at that time. Patient does not have a primary dentist in [place], [place]. Patient has been in [place] for 23 years. Patient denies having any partial dentures.  PROBLEM LIST: Patient Active Problem List   Diagnosis Date Noted  . Non-small cell carcinoma of lung, stage 4 ([place]) 11/12/2014    Priority: High  . Pulmonary infiltrates  c/w [place]/ Stage IV  10/13/2014    PMH: History reviewed. No pertinent past medical history.  PSH: Past Surgical History  Procedure Laterality Date  . Video bronchoscopy Bilateral 10/23/2014    Procedure: VIDEO BRONCHOSCOPY WITHOUT FLUORO;  Surgeon: Tanda Rockers, MD;  Location: [facility];  Service: Cardiopulmonary;  Laterality: Bilateral;    ALLERGIES: No Known Allergies  MEDICATIONS: Current Outpatient Prescriptions  Medication Sig Dispense Refill  . acetaminophen-codeine (TYLENOL #3) 300-30 MG per tablet TAKE ONE TABLET BY MOUTH EVERY 4 HOURS AS NEEDED FOR MODERATE PAIN 40 tablet 0  . HYDROcodone-acetaminophen (NORCO/VICODIN) 5-325 MG tablet Take 1-2 tablets by mouth every 4 (four) hours as needed for moderate pain (or cough). 40 tablet 0  . ibuprofen  (ADVIL,MOTRIN) 200 MG tablet Take 200 mg by mouth every 6 (six) hours as needed.    . erlotinib (TARCEVA) 150 MG tablet Take 1 tablet (150 mg total) by mouth daily. Take on an empty stomach 1 hour before meals or 2 hours after. (Patient not taking: Reported on 11/18/2014) 30 tablet 2   No current facility-administered medications for this visit.    LABS: Lab Results  Component Value Date   WBC 9.0 11/12/2014   HGB 12.8* 11/12/2014   HCT 38.6 11/12/2014   MCV 83.6 11/12/2014   PLT 429* 11/12/2014      Component Value Date/Time   NA 136 11/12/2014 1241   K 3.9 11/12/2014 1241   CO2 28 11/12/2014 1241   GLUCOSE 296* 11/12/2014 1241   BUN 11.0 11/12/2014 1241   CREATININE 0.9 11/12/2014 1241   CALCIUM 9.6 11/12/2014 1241   No results found for: INR, PROTIME No results found for: PTT  SOCIAL HISTORY: Social History   Social History  . Marital Status: Married    Spouse Name: N/A  . Number of Children: 1  . Years of Education: N/A   Occupational History  . [employer]    Social History Main Topics  . Smoking status: Never Smoker   . Smokeless tobacco: Never Used  . Alcohol Use: No  . Drug Use: No  . Sexual Activity: Not on file   Other Topics Concern  . Not on file   Social History Narrative    FAMILY HISTORY: Family History  Problem Relation Age of Onset  . Cancer Father     Prostate cancer  . Diabetes Mother  REVIEW OF SYSTEMS: Constitutional: positive for anorexia, fatigue and weight loss Eyes: negative Ears, nose, mouth, throat, and face: negative Respiratory: positive for cough, dyspnea on exertion and pleurisy/chest pain Cardiovascular: negative Gastrointestinal: negative Genitourinary:negative Integument/breast: negative Hematologic/lymphatic: negative Musculoskeletal:positive for bone pain Neurological: positive for headaches Behavioral/Psych: negative Endocrine: History of diabetes mellitus with previous metformin therapy. Patient now using  diet to control his diabetes.  Allergic/Immunologic: negative  DENTAL HISTORY: CHIEF COMPLAINT: Patient referred by Dr. Julien Nordmann for a medically necessary Pre-Xgeva dental consultation.  HPI: Steven Roberts is a 49 year old male recently diagnosed with non-small cell lung cancer. Patient with anticipated Tarceva therapy along with possible Xgeva therapy for the bony metastases. The patient is now seen as part of a medically necessary pre-Xgeva dental protocol examination.  The patient currently denies acute toothaches, swellings, or abscesses. The patient was last seen by a dentist in [place] 5-6 years ago. Patient had several fillings placed at that time. Patient does not have a primary dentist in [place], [place]. Patient has been in [place] for 23 years. Patient denies having any partial dentures.  DENTAL EXAMINATION: GENERAL: The patient is a well-developed, well-nourished male in no acute distress. HEAD AND NECK: There is no palpable submandibular lymphadenopathy. The patient denies acute TMJ symptoms. INTRAORAL EXAM: The patient has normal saliva. The patient has a deep palatal vault. Patient has small bilateral mandibular tori. There is no evidence of oral abscess formation. DENTITION: Patient is missing tooth numbers 3, 16, 19, and 30. Tooth #32 is lingually inclined. There is supra-eruption and drifting of the unopposed teeth into the edentulous areas. PERIODONTAL: Patient has chronic periodontitis with plaque and calculus Relations, selective areas of gingival recession, and no obvious tooth mobility. DENTAL CARIES/SUBOPTIMAL RESTORATIONS: No obvious dental caries are noted. ENDODONTIC: Patient currently denies acute pulpitis symptoms. I do not see any evidence of periapical pathology. CROWN AND BRIDGE: There are no crown or bridge restorations. PROSTHODONTIC: There are no partial dentures. OCCLUSION: Patient has a poor occlusal scheme secondary to multiple missing teeth,  supra-eruption and drifting of the unopposed teeth into the edentulous areas, and lack of replacement of all missing teeth with dental prostheses.  RADIOGRAPHIC INTERPRETATION: An orthopantogram was taken and supplemented with a full series of dental radiographs. There are multiple missing teeth. There is supra-eruption and drifting of the unopposed teeth into the edentulous areas. Multiple amalgam restorations are noted. There is incipient bone loss noted. Multiple diastemas are noted. The maxillary sinuses are noted and are well aerated.  ASSESSMENTS: 1. Non-small cell lung cancer with bony metastases 2. Prechemotherapy with Tarceva 3. Pre-Xgeva therapy dental protocol 4. Chronic periodontitis with bone loss 5. Accretions 6. Selective areas of gingival recession 7. Multiple missing teeth 8. Supra-eruption and drifting of the unopposed teeth into the edentulous areas 9. Multiple diastemas 10. Poor occlusal scheme but a stable occlusion 11. Potential Risk for drug-induced osteonecrosis of the jaw with invasive dental procedures with Xgeva therapy  PLAN/RECOMMENDATIONS: 1. I discussed the risks, benefits, and complications of various treatment options with the patient in relationship to his medical and dental conditions, anticipated Tarceva therapy, and anticipated Xgeva therapy and the potential for drug-induced osteonecrosis of the jaw with invasive dental procedures. We discussed various treatment options to include no treatment, multiple extractions with alveoloplasty, pre-prosthetic surgery as indicated, periodontal therapy, dental restorations, root canal therapy, crown and bridge therapy, implant therapy, and replacement of missing teeth as indicated. The patient currently wishes to defer any dental extractions at this time. We  did discuss possible extraction of tooth numbers 1 and 32 by an oral surgeon, but the patient refused this referral as well. Patient did agree to proceed with full  mouth debridement of remaining dentition tomorrow. Patient will then follow-up with the primary dentist of his choice for routine periodontal maintenance and oral examinations.   2. Discussion of findings with medical team and coordination of future medical and dental care as needed.  I spent in excess of  120 minutes during the conduct of this consultation and >50% of this time involved direct face-to-face encounter for counseling and/or coordination of the patient's care.    Lenn Cal, DDS

## 2014-11-18 NOTE — Patient Instructions (Signed)
Return to clinic as scheduled for gross debridement or remaining dentition. Dr. Enrique Sack

## 2014-11-18 NOTE — Telephone Encounter (Signed)
Spoke with the pt's spouse  She was requesting refill on tylenol # 3  I see in the chart he had been also taking viocodin  She states he is out of vicodin, but still has 2 tabs left of tylenol # 3  Per MW- ok to refill which ever med helps control pain the most, and then have Dr Julien Nordmann refill from now on  She chose to have vicodin refilled  MW signed rx and she is aware to have Mohamed refill from now on  Nothing further needed

## 2014-11-19 ENCOUNTER — Ambulatory Visit (HOSPITAL_COMMUNITY): Payer: Self-pay | Admitting: Dentistry

## 2014-11-19 ENCOUNTER — Encounter (HOSPITAL_COMMUNITY): Payer: Self-pay | Admitting: Dentistry

## 2014-11-19 ENCOUNTER — Ambulatory Visit (HOSPITAL_COMMUNITY)
Admission: RE | Admit: 2014-11-19 | Discharge: 2014-11-19 | Disposition: A | Discharge status: Home / Self Care | Payer: Self-pay | Source: Ambulatory Visit | Attending: Internal Medicine | Admitting: Internal Medicine

## 2014-11-19 VITALS — BP 115/66 | HR 78 | Temp 98.4°F

## 2014-11-19 DIAGNOSIS — Q283 Other malformations of cerebral vessels: Secondary | ICD-10-CM | POA: Insufficient documentation

## 2014-11-19 DIAGNOSIS — C3492 Malignant neoplasm of unspecified part of left bronchus or lung: Secondary | ICD-10-CM

## 2014-11-19 DIAGNOSIS — K0530 Chronic periodontitis, unspecified: Secondary | ICD-10-CM

## 2014-11-19 DIAGNOSIS — Z01818 Encounter for other preprocedural examination: Secondary | ICD-10-CM

## 2014-11-19 DIAGNOSIS — K036 Deposits [accretions] on teeth: Secondary | ICD-10-CM

## 2014-11-19 MED ORDER — GADOBENATE DIMEGLUMINE 529 MG/ML IV SOLN
20.0000 mL | Freq: Once | INTRAVENOUS | Status: AC | PRN
  Administered 2014-11-19: 17 mL via INTRAVENOUS

## 2014-11-19 NOTE — Patient Instructions (Addendum)
Plan/recommendations 1. Patient to brush after meals and at bedtime. Floss at bedtime. 2. Follow-up with a new primary dentist for an exam and cleaning every 4-6 month as indicated. 3. Patient is currently cleared for use of Xgeva therapy.  Lenn Cal, DDS

## 2014-11-19 NOTE — Progress Notes (Signed)
11/19/2014  Patient:            Steven Roberts Date of Birth:  03/28/1965 MRN:                [id number]   BP 115/66 mmHg  Pulse 78  Temp(Src) 98.4 F (36.9 C) (Oral)   [name] presents for periodontal therapy. Discussed risks, benefits, complications of the proposed treatment. Patient agrees to proceed with treatment as planned.  Procedure: Adult prophylaxis  of remaining dentition with a KaVo scaler. Further accretions removed with a series of curettes and scaler's. KaVo sonic scaler used again to further refine removal of accretions. [place] Oral hygiene instructions. Floss. The patient tolerated the procedure well.  Plan/recommendations 1. Patient to brush after meals and at bedtime. Floss at bedtime. 2. Follow-up with a new primary dentist for an exam and cleaning every 4-6 month as indicated. 3. Patient is currently cleared for use of Xgeva therapy.  Lenn Cal, DDS

## 2014-11-20 LAB — FUNGUS CULTURE W SMEAR
Fungal Smear: NONE SEEN
SPECIAL REQUESTS: NORMAL

## 2014-11-23 ENCOUNTER — Encounter: Payer: Self-pay | Admitting: Radiation Oncology

## 2014-11-23 ENCOUNTER — Encounter: Payer: Self-pay | Admitting: Internal Medicine

## 2014-11-23 NOTE — Progress Notes (Signed)
Pt is approved thru [place] to receive Tarceva.  A shipment of Tarceva will be coordinated with the pt.  The approval will remain effective for 1 year or until therapy is discontinued, the pt obtains insurance coverage for Tarceva, or the pt fails to meet other program requirements.

## 2014-11-23 NOTE — Progress Notes (Addendum)
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 Legrand Como Wert,MD Endobronchial biopsy, LUL orifice - ADENOCARCINOMA.Diagnosis BRONCHIAL WASHING, LUL (SPECIMEN 1 OF 1 COLLECTED 10/23/14): MALIGNANT CELLS CONSISTENT WITH ADENOCARCINOMA  Sites of Visceral and Bony Metastatic Disease: Left 6th rib, right hip-acetabulum and right femoral neck.  Location(s) of Symptomatic Metastases: patient had a progressive dry cough since March which became worse in August.  Left post CP x 2 months and new pleuritic pain anteriorly, new night sweats and unintended weight loss > 30 lbs and SOB.  Past/Anticipated chemotherapy by medical oncology, if any: Dr. Julien Nordmann to start [place] tomorrow and Delton See, for metastatic bone disease. Referral to Dr. Enrique Sack for pre-Xgeva on 11/18/14.  Pain on a scale of 0-10 is: 5/10 in left shoulder and right hip.  Taking Vicodin q 4 hours and needs a refill.  Ambulatory status? Walker? Wheelchair?:  Ambulatory - does have trouble walking due to right hip pain.  SAFETY ISSUES:  Prior radiation? no  Pacemaker/ICD? No  Current Complaints / other details:  Married, 1 son, non smoker, no alcohol or drug use, lost over 30 lbs over last 3 months.  His father had prostate cancer,(60's) Mother =HTN,DM  Allergies:NKA  Needs a refill on Vicodin - previously filled by Dr. Melvyn Novas.  BP 111/72 mmHg  Pulse 89  Temp(Src) 99 F (37.2 C) (Oral)  Resp 20  Ht '5\' 8"'$  (1.727 m)  Wt 180 lb (81.647 kg)  BMI 27.38 kg/m2  SpO2 96%

## 2014-11-25 ENCOUNTER — Ambulatory Visit
Admission: RE | Admit: 2014-11-25 | Discharge: 2014-11-25 | Disposition: A | Discharge status: Home / Self Care | Payer: Medicaid Other | Source: Ambulatory Visit | Attending: Radiation Oncology | Admitting: Radiation Oncology

## 2014-11-25 VITALS — BP 111/72 | HR 89 | Temp 99.0°F | Resp 20 | Ht 68.0 in | Wt 180.0 lb

## 2014-11-25 DIAGNOSIS — Z833 Family history of diabetes mellitus: Secondary | ICD-10-CM | POA: Insufficient documentation

## 2014-11-25 DIAGNOSIS — C3412 Malignant neoplasm of upper lobe, left bronchus or lung: Secondary | ICD-10-CM | POA: Diagnosis present

## 2014-11-25 DIAGNOSIS — Z809 Family history of malignant neoplasm, unspecified: Secondary | ICD-10-CM | POA: Insufficient documentation

## 2014-11-25 DIAGNOSIS — Z510 Encounter for antineoplastic radiation therapy: Secondary | ICD-10-CM | POA: Diagnosis present

## 2014-11-25 DIAGNOSIS — C3492 Malignant neoplasm of unspecified part of left bronchus or lung: Secondary | ICD-10-CM

## 2014-11-25 DIAGNOSIS — C7951 Secondary malignant neoplasm of bone: Secondary | ICD-10-CM

## 2014-11-25 HISTORY — DX: Malignant neoplasm of bone and articular cartilage, unspecified: C41.9

## 2014-11-25 HISTORY — DX: Secondary malignant neoplasm of bone: C79.51

## 2014-11-25 HISTORY — DX: Malignant neoplasm of unspecified part of unspecified bronchus or lung: C34.90

## 2014-11-25 MED ORDER — HYDROCODONE-ACETAMINOPHEN 5-325 MG PO TABS: 1.0000 | ORAL_TABLET | ORAL | Status: DC | PRN

## 2014-11-25 NOTE — Addendum Note (Signed)
Encounter addended by: Jacqulyn Liner, RN on: 11/25/2014  3:37 PM<BR>     Documentation filed: Charges VN

## 2014-11-25 NOTE — Progress Notes (Signed)
Radiation Oncology         [phone] ________________________________  Name: Steven Roberts MRN: [id number]  Date: 11/25/2014  DOB: [birth year]  CC:Pcp Not In System  Curt Bears, MD     REFERRING PHYSICIAN: Curt Bears, MD   DIAGNOSIS: The primary encounter diagnosis was Non-small cell carcinoma of lung, stage 4, left ([place]). A diagnosis of Bone metastasis ([place]) was also pertinent to this visit.   HISTORY OF PRESENT ILLNESS::Steven Roberts is a 50 y.o. 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 Since March 2016, the patient began experiencing a dry persistent cough. He was subsequently treated with antibiotics from his primary care physician with minimal improvement in symptoms.   At that time, a CT scan of his chest was performed and revealed a left upper lobe lung mass with mediastinal lymphadenopathy. He was referred to Dr. Melvyn Novas for evaluation of his persistent cough and left-sided chest pain. On 10/20/14, a chest X-ray showed worrisome findings for a left hilar or suprahilar mass with postobstructive atelectatic change in the left upper lobe. Dr. Melvyn Novas, on 10/23/14, performed video flexible fiberoptic bronchoscopy with washing and endobronchial biopsy of the left upper lobe.The final pathology (Accession: [id number]) showed adenocarcinoma.   PET scan on 11/03/14 showed hypermetabolic dominant left upper lobe mass and extensive adenopathy involving the mediastinum, hilar and supraclavicular stations. A sub solid left lower lobe nodule was also hypermetabolic however this was not seen on outside CT, likely inflammatory rather than second focus of adenocarcinoma. Numerous small pulmonary nodules bilaterally where seen but imaging was too small to characterize this as additional metastatic disease. There was suspected osseous metastases in the left sixth rib, the right acetabulum and right femoral  neck.  The patient presents with current symptoms of dry cough with pain on the left side of the chest and right hip area, shortness of breath, headaches, night sweats and unintended weight loss of 30 pounds over 3 months. Patient notes that hip pain started about 3 weeks ago. 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 Patient takes Vicodin with some alleviation of hip pain and cough. Patient has also noted left shoulder pain.   Family history significant for a father who diagnosed with prostate cancer in his 42s, mother still alive at age 68 has diabetes mellitus and hypertension.     PREVIOUS RADIATION THERAPY: No  PAST MEDICAL HISTORY:  has a past medical history of Bone cancer ([place]) (11/03/14 Pet scan) and Lung cancer ([place]) (10/23/14).     PAST SURGICAL HISTORY: Past Surgical History  Procedure Laterality Date  . Video bronchoscopy Bilateral 10/23/2014    Procedure: VIDEO BRONCHOSCOPY WITHOUT FLUORO;  Surgeon: Tanda Rockers, MD;  Location: [facility];  Service: Cardiopulmonary;  Laterality: Bilateral;     FAMILY HISTORY: family history includes Cancer in his father; Diabetes in his mother.   SOCIAL HISTORY:  reports that he has never smoked. He has never used smokeless tobacco. He reports that he does not drink alcohol or use illicit drugs.   ALLERGIES: Review of patient's allergies indicates no known allergies.   MEDICATIONS:  Current Outpatient Prescriptions  Medication Sig Dispense Refill  . HYDROcodone-acetaminophen (NORCO/VICODIN) 5-325 MG tablet Take 1-2 tablets by mouth every 4 (four) hours as needed for moderate pain (or cough). 90 tablet 0  . erlotinib (TARCEVA) 150 MG tablet Take 1 tablet (150  mg total) by mouth daily. Take on an empty stomach 1 hour before meals or 2 hours after. (Patient not taking: Reported on 11/18/2014) 30 tablet 2  . ibuprofen (ADVIL,MOTRIN) 200 MG tablet Take 200 mg by mouth every 6 (six) hours as needed.      No current facility-administered medications for this encounter.     REVIEW OF SYSTEMS:  A 15 point review of systems is documented in the electronic medical record. This was obtained by the nursing staff. However, I reviewed this with the patient to discuss relevant findings and make appropriate changes.  Pertinent items are noted in HPI.    PHYSICAL EXAM:  height is '5\' 8"'$  (1.727 m) and weight is 180 lb (81.647 kg). His oral temperature is 99 F (37.2 C). His blood pressure is 111/72 and his pulse is 89. His respiration is 20 and oxygen saturation is 96%.   Lungs: Clear to auscultation bilaterally Heart: Regular rate and rhythm   Patient was tender in left clavicular region.   ECOG = 2  0 - Asymptomatic (Fully active, able to carry on all predisease activities without restriction)  1 - Symptomatic but completely ambulatory (Restricted in physically strenuous activity but ambulatory and able to carry out work of a light or sedentary nature. For example, light housework, office work)  2 - Symptomatic, <50% in bed during the day (Ambulatory and capable of all self care but unable to carry out any work activities. Up and about more than 50% of waking hours)  3 - Symptomatic, >50% in bed, but not bedbound (Capable of only limited self-care, confined to bed or chair 50% or more of waking hours)  4 - Bedbound (Completely disabled. Cannot carry on any self-care. Totally confined to bed or chair)  5 - Death   Eustace Pen MM, Creech RH, Tormey DC, et al. [phone]). "Toxicity and response criteria of the [facility] Group". [place] Oncol. 5 (6): 649-55  _   LABORATORY DATA:  Lab Results  Component Value Date   WBC 9.0 11/12/2014   HGB 12.8* 11/12/2014   HCT 38.6 11/12/2014   MCV 83.6 11/12/2014   PLT 429* 11/12/2014   Lab Results  Component Value Date   NA 136 11/12/2014   K 3.9 11/12/2014   CO2 28 11/12/2014   Lab Results  Component Value Date   ALT 29  11/12/2014   AST 18 11/12/2014   ALKPHOS 119 11/12/2014   BILITOT 0.48 11/12/2014      RADIOGRAPHY: Mr Jeri Cos Wo Contrast  11/19/2014  CLINICAL DATA:  Stage IV non-small cell lung cancer. Left-sided headaches for 3 months. EXAM: MRI HEAD WITHOUT AND WITH CONTRAST TECHNIQUE: Multiplanar, multiecho pulse sequences of the brain and surrounding structures were obtained without and with intravenous contrast. CONTRAST:  17 mL MultiHance. COMPARISON:  None. FINDINGS: No acute infarct, hemorrhage, or mass lesion is present. The ventricles are of normal size. No significant extraaxial fluid collection is present. No significant white matter changes are present. The internal auditory canals and brainstem are normal. Flow is present in the major intracranial arteries. The globes orbits are intact. Mild mucosal thickening is present in the maxillary sinuses and ethmoid air cells bilaterally. There is minimal mucosal thickening in the right frontal sinus. The sphenoid sinuses and mastoid air cells are clear. Skullbase is within normal limits.  Midline images are unremarkable. Postcontrast images demonstrate no pathologic enhancement to suggest metastatic disease the brain or meninges. Linear enhancement in the right temporal  lobe is compatible with a developmental venous anomaly. IMPRESSION: 1. No evidence for metastatic disease to the brain or meninges. 2. 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 3. Minimal sinus disease. Electronically Signed   By: San Morelle M.D.   On: 11/19/2014 09:03   Nm Pet Image Initial (pi) Skull Base To Thigh  11/03/2014  CLINICAL DATA:  Initial treatment strategy for left upper lobe adenocarcinoma. EXAM: NUCLEAR MEDICINE PET SKULL BASE TO THIGH TECHNIQUE: 9.42 mCi F-18 FDG was injected intravenously. Full-ring PET imaging was performed from the skull base to thigh after the radiotracer. CT data was obtained and used for attenuation correction and  anatomic localization. FASTING BLOOD GLUCOSE:  Value: 151 mg/dl COMPARISON:  Outside chest CT 10/23/2014-no report. Chest radiographs 10/20/2014. FINDINGS: NECK There are bilateral hypermetabolic enlarged level 4 lymph nodes bilaterally. On the left, these measure up to 2.4 cm on image 49 and have an SUV max of 15.2. No hypermetabolic upper cervical lymph nodes identified. There is mildly prominent activity within the lymphoid tissue of Waldeyer's ring and the soft palate, but no definite focal lesions of the pharyngeal mucosal space. CHEST There is extensive hypermetabolic mediastinal and hilar adenopathy bilaterally. Approximately 1.6 cm subcarinal node has an SUV max of 9.1. The large central left upper lobe mass is also hypermetabolic with an SUV max of 14.6. This hypermetabolic activity is fairly homogeneous without definite necrosis. A 2.0 x 1.3 cm sub solid left lower lobe nodule on image 45 is hypermetabolic with an SUV max of 4.8. There are several other small pulmonary nodules bilaterally, too small to characterize with PET. There is a small pericardial effusion without abnormal metabolic activity. No significant pleural effusion. ABDOMEN/PELVIS There is no hypermetabolic activity within the liver, adrenal glands, spleen or pancreas. There is no hypermetabolic nodal activity. SKELETON There is a small lucent hypermetabolic lesion laterally in the left sixth rib, demonstrating an SUV max of 5.2, suspicious for metastatic disease. There is also prominent metabolic activity around the right hip (SUV max 10.5), primarily within the acetabulum, but also within the anterior aspect of the greater trochanter. Some of this activity could be synovial although there appears to be a small lucent lesion in the femoral neck. No focal lesion identified within the acetabulum on the CT images. IMPRESSION: 1. The dominant left upper lobe mass and extensive adenopathy involving the mediastinum, hilar and supraclavicular  stations are all hypermetabolic consistent with metastatic disease. 2. Dominant sub solid left lower lobe nodule is hypermetabolic, and not clearly seen on recent outside CT, probably inflammatory rather than a second focus of adenocarcinoma. 3. Numerous small pulmonary nodules bilaterally, too small to characterize with PET, although suspicious for metastatic disease. 4. Suspected osseous metastases in the left 6th rib, the right acetabulum and right femoral neck. The activity around the right hip is somewhat atypical and potentially stress mediated or secondary to synovitis. This activity could be more definitively characterized with MRI to include post-contrast imaging if clinically warranted. Electronically Signed   By: Richardean Sale M.D.   On: 11/03/2014 15:36      IMPRESSION:  The patient has been diagnosed with non-small cell lung cancer (LUL) with bone metastases. The patient is experiencing significant pain in the left side of the chest and right hip area with persistent dry cough. The patient would benefit from palliative radiation treatment to the right hip and left chest/rib. Upon review of imaging, left lung tumor extends towards chest wall and upper chest area  but no evidence of disease is seen in his left shoulder where patient has noted additional pain.    PLAN: We discussed the possible side effects and risks of treatment in addition to the possible benefits of treatment. We discussed the protocol for radiation treatment.  All of the patient's questions were answered. The patient does wish to proceed with this treatment. A simulation will be scheduled such that we can proceed with treatment planning as soon as possible. I anticipate a 3 week course of treatment.   I refilled patient's pain medication.   Patient to start Tarceva tomorrow with Dr.Mohamed.       ________________________________   Jodelle Gross, MD, PhD   **Disclaimer: This note was dictated with voice  recognition software. Similar sounding words can inadvertently be transcribed and this note may contain transcription errors which may not have been corrected upon publication of note.**  This document serves as a record of services personally performed by Kyung Rudd, MD. It was created on his behalf by Derek Mound, a trained medical scribe. The creation of this record is based on the scribe's personal observations and the provider's statements to them. This document has been checked and approved by the attending provider.

## 2014-11-25 NOTE — Progress Notes (Signed)
Please see the Nurse Progress Note in the MD Initial Consult Encounter for this patient. 

## 2014-11-26 ENCOUNTER — Encounter: Payer: Self-pay | Admitting: *Deleted

## 2014-11-26 ENCOUNTER — Other Ambulatory Visit: Payer: Self-pay

## 2014-11-26 NOTE — Progress Notes (Signed)
Patient a no show for education class.  Chris, oral navigator with get in touch with patient to discuss Tarceva.

## 2014-11-27 ENCOUNTER — Ambulatory Visit
Admission: RE | Admit: 2014-11-27 | Discharge: 2014-11-27 | Disposition: A | Discharge status: Home / Self Care | Payer: Medicaid Other | Source: Ambulatory Visit | Attending: Radiation Oncology | Admitting: Radiation Oncology

## 2014-11-27 ENCOUNTER — Encounter: Payer: Self-pay | Admitting: Pharmacist

## 2014-11-27 DIAGNOSIS — C7951 Secondary malignant neoplasm of bone: Secondary | ICD-10-CM

## 2014-11-27 DIAGNOSIS — Z5111 Encounter for antineoplastic chemotherapy: Secondary | ICD-10-CM | POA: Insufficient documentation

## 2014-11-27 DIAGNOSIS — Z510 Encounter for antineoplastic radiation therapy: Secondary | ICD-10-CM | POA: Diagnosis not present

## 2014-11-27 NOTE — Progress Notes (Signed)
Oral Chemotherapy Pharmacist Encounter  I spoke with patient's wife Electrical engineer) over the phone for overview of new oral chemotherapy medication for Lung cancer: Tarceva (Erlotinib). Pt is doing well. The prescription is being covered through [place] assistance program as patient does not have insurance. Pt is doing well with no complaints  Pt received and started the medication on 11/25/14  Counseled patient on administration, dosing, side effects, safe handling, and monitoring. Side effects include but not limited to: fatigue, GI toxicity, diarrhea, rash, cough, lung disease, and infection.  Patient's wife, Linton Ham, voiced understanding and appreciation.   All questions answered.  Will follow up in 1-2 weeks for adherence and toxicity management.   Thank you,  Montel Clock, PharmD, [facility]

## 2014-11-30 ENCOUNTER — Ambulatory Visit: Payer: Medicaid Other

## 2014-12-01 ENCOUNTER — Ambulatory Visit: Payer: Self-pay | Admitting: Internal Medicine

## 2014-12-01 ENCOUNTER — Ambulatory Visit: Payer: Medicaid Other

## 2014-12-01 ENCOUNTER — Other Ambulatory Visit: Payer: Self-pay

## 2014-12-02 ENCOUNTER — Other Ambulatory Visit (HOSPITAL_BASED_OUTPATIENT_CLINIC_OR_DEPARTMENT_OTHER): Payer: Self-pay

## 2014-12-02 ENCOUNTER — Telehealth: Payer: Self-pay

## 2014-12-02 ENCOUNTER — Ambulatory Visit (HOSPITAL_COMMUNITY)
Admission: RE | Admit: 2014-12-02 | Discharge: 2014-12-02 | Disposition: A | Discharge status: Home / Self Care | Payer: Self-pay | Source: Ambulatory Visit | Attending: Nurse Practitioner | Admitting: Nurse Practitioner

## 2014-12-02 ENCOUNTER — Encounter: Payer: Self-pay | Admitting: Skilled Nursing Facility1

## 2014-12-02 ENCOUNTER — Ambulatory Visit (HOSPITAL_BASED_OUTPATIENT_CLINIC_OR_DEPARTMENT_OTHER): Payer: Self-pay | Admitting: Nurse Practitioner

## 2014-12-02 ENCOUNTER — Ambulatory Visit: Payer: Medicaid Other

## 2014-12-02 VITALS — BP 120/74 | HR 115 | Temp 98.4°F | Resp 20 | Ht 68.0 in | Wt 189.8 lb

## 2014-12-02 DIAGNOSIS — R1084 Generalized abdominal pain: Secondary | ICD-10-CM

## 2014-12-02 DIAGNOSIS — C3490 Malignant neoplasm of unspecified part of unspecified bronchus or lung: Secondary | ICD-10-CM

## 2014-12-02 DIAGNOSIS — R609 Edema, unspecified: Secondary | ICD-10-CM

## 2014-12-02 DIAGNOSIS — R0681 Apnea, not elsewhere classified: Secondary | ICD-10-CM

## 2014-12-02 DIAGNOSIS — R740 Nonspecific elevation of levels of transaminase and lactic acid dehydrogenase [LDH]: Secondary | ICD-10-CM

## 2014-12-02 DIAGNOSIS — R739 Hyperglycemia, unspecified: Secondary | ICD-10-CM

## 2014-12-02 DIAGNOSIS — E46 Unspecified protein-calorie malnutrition: Secondary | ICD-10-CM

## 2014-12-02 DIAGNOSIS — R7401 Elevation of levels of liver transaminase levels: Secondary | ICD-10-CM | POA: Insufficient documentation

## 2014-12-02 DIAGNOSIS — R600 Localized edema: Secondary | ICD-10-CM | POA: Insufficient documentation

## 2014-12-02 DIAGNOSIS — E8809 Other disorders of plasma-protein metabolism, not elsewhere classified: Secondary | ICD-10-CM

## 2014-12-02 DIAGNOSIS — R109 Unspecified abdominal pain: Secondary | ICD-10-CM | POA: Insufficient documentation

## 2014-12-02 DIAGNOSIS — R1030 Lower abdominal pain, unspecified: Secondary | ICD-10-CM | POA: Insufficient documentation

## 2014-12-02 DIAGNOSIS — L271 Localized skin eruption due to drugs and medicaments taken internally: Secondary | ICD-10-CM

## 2014-12-02 DIAGNOSIS — Z510 Encounter for antineoplastic radiation therapy: Secondary | ICD-10-CM | POA: Diagnosis not present

## 2014-12-02 DIAGNOSIS — R21 Rash and other nonspecific skin eruption: Secondary | ICD-10-CM | POA: Insufficient documentation

## 2014-12-02 DIAGNOSIS — E860 Dehydration: Secondary | ICD-10-CM

## 2014-12-02 HISTORY — DX: Other disorders of phosphorus metabolism: E83.39

## 2014-12-02 HISTORY — DX: Other disorders of plasma-protein metabolism, not elsewhere classified: E88.09

## 2014-12-02 HISTORY — DX: Unspecified protein-calorie malnutrition: E46

## 2014-12-02 LAB — CBC WITH DIFFERENTIAL/PLATELET
BASO%: 0.6 % (ref 0.0–2.0)
BASOS ABS: 0.1 10*3/uL (ref 0.0–0.1)
EOS ABS: 0.1 10*3/uL (ref 0.0–0.5)
EOS%: 0.8 % (ref 0.0–7.0)
HEMATOCRIT: 29.3 % — AB (ref 38.4–49.9)
HEMOGLOBIN: 9.8 g/dL — AB (ref 13.0–17.1)
LYMPH#: 1.3 10*3/uL (ref 0.9–3.3)
LYMPH%: 12.5 % — ABNORMAL LOW (ref 14.0–49.0)
MCH: 27.9 pg (ref 27.2–33.4)
MCHC: 33.3 g/dL (ref 32.0–36.0)
MCV: 83.7 fL (ref 79.3–98.0)
MONO#: 0.9 10*3/uL (ref 0.1–0.9)
MONO%: 8.5 % (ref 0.0–14.0)
NEUT#: 8.1 10*3/uL — ABNORMAL HIGH (ref 1.5–6.5)
NEUT%: 77.6 % — AB (ref 39.0–75.0)
Platelets: 629 10*3/uL — ABNORMAL HIGH (ref 140–400)
RBC: 3.5 10*6/uL — ABNORMAL LOW (ref 4.20–5.82)
RDW: 13.6 % (ref 11.0–14.6)
WBC: 10.4 10*3/uL — ABNORMAL HIGH (ref 4.0–10.3)

## 2014-12-02 LAB — COMPREHENSIVE METABOLIC PANEL (CC13)
ALBUMIN: 2.9 g/dL — AB (ref 3.5–5.0)
ALK PHOS: 293 U/L — AB (ref 40–150)
ALT: 140 U/L — ABNORMAL HIGH (ref 0–55)
AST: 38 U/L — ABNORMAL HIGH (ref 5–34)
Anion Gap: 9 mEq/L (ref 3–11)
BUN: 17.3 mg/dL (ref 7.0–26.0)
CALCIUM: 9.6 mg/dL (ref 8.4–10.4)
CHLORIDE: 100 meq/L (ref 98–109)
CO2: 25 mEq/L (ref 22–29)
Creatinine: 0.9 mg/dL (ref 0.7–1.3)
Glucose: 267 mg/dl — ABNORMAL HIGH (ref 70–140)
POTASSIUM: 4.6 meq/L (ref 3.5–5.1)
Sodium: 134 mEq/L — ABNORMAL LOW (ref 136–145)
Total Bilirubin: 1.07 mg/dL (ref 0.20–1.20)
Total Protein: 6.8 g/dL (ref 6.4–8.3)

## 2014-12-02 MED ORDER — ONDANSETRON HCL 8 MG PO TABS: 8.0000 mg | ORAL_TABLET | Freq: Three times a day (TID) | ORAL | Status: DC | PRN

## 2014-12-02 NOTE — Progress Notes (Signed)
VASCULAR LAB PRELIMINARY  PRELIMINARY  PRELIMINARY  PRELIMINARY  Left lower extremity venous duplex completed.    Preliminary report:  Left:  No evidence of DVT, superficial thrombosis, or Baker's cyst.  Warner Laduca, RVS 12/02/2014, 3:49 PM

## 2014-12-02 NOTE — Progress Notes (Signed)
Subjective:     Patient ID: Steven Roberts, male   DOB: April 22, 1965, 49 y.o.   MRN: [id number]  HPI   Review of Systems     Objective:   Physical Exam To assist the pt in identifying some dietary strategies to gain some lost weight back.    Assessment:     Pt identified as being malnourished due to weight loss. Pt was contacted via the telephone at [phone]. Pt was unavailable. Pts wife called Ernestene Kiel and states her husband is very sick so she will be bringing him in to see her today (12/02/2014).    Plan:     Dietitian left a message prompting the pt to call Ernestene Kiel CSO, RD,LDN at [phone].

## 2014-12-02 NOTE — Assessment & Plan Note (Addendum)
Patient initiated Tarceva oral chemotherapy on 11/26/2014.  His wife reports that patient is having periods of apnea; specifically  At night when he is asleep.  Patient's wife states that patient awakes from sleep coughing ; and sit straight up in the bed.  He appears to gag when he has coughing; and has a tendency 15 second period of apnea. During this episode- patient does not appear aware of his surroundings.  Patient's wife states that she will pat him on the back; and he will awaken and take a deep breath.  This is happening for the last 3 nights in a row.   Patient does deal with chronic pain; and takes hydrocodone 2 tablets every 4 hours on a regular basis.    exam reveals lung sounds clear bilaterally; with no wheeze.  Patient does have a dry cough.    blood counts obtained today revealed a WBC of 10.4, ANC 8.1, hemoglobin 9.8, and platelet count 629.    Dr. Julien Nordmann and to review all details with the patient and his wife as well; and he feels that these episodes may be secondary to the increased pain medication. Patient was advised to try taking the pain medications every 6-8 hours instead; and also try alternating with ibuprofen.    Patient was advised to call/return if symptoms continue; as patient may very well need to be evaluated per his pulmonologist Dr. Melvyn Novas.   Also, patient is scheduled to initiate radiation treatments on Monday, 12/07/2014.   Patient was advised to return to the [facility] for labs and a follow-up  Visit next Wednesday, 12/09/2014.

## 2014-12-02 NOTE — Telephone Encounter (Signed)
Steven Roberts called stating that for the past three days Steven Roberts is more SOB.  He can only take 2-4 steps and is very SOB. She also noticed beginning on Sunday 11-29-14 that he is having periods of apnea and she has to shake him to start breathing.  He some times is just starring and not breathing.  These episodes are occuring during the day and at night.  He is in a recliner while these episodes occur. Wife would like for him to be seen and evaluated. Reviewed with Dr Julien Nordmann.  Will bring Steven Roberts  In to see Selena Lesser, NP at 1300.  Wife verbalized understanding.

## 2014-12-03 ENCOUNTER — Ambulatory Visit: Payer: Medicaid Other

## 2014-12-03 ENCOUNTER — Telehealth: Payer: Self-pay | Admitting: *Deleted

## 2014-12-03 ENCOUNTER — Telehealth: Payer: Self-pay | Admitting: Nurse Practitioner

## 2014-12-03 ENCOUNTER — Encounter: Payer: Self-pay | Admitting: Nurse Practitioner

## 2014-12-03 NOTE — Assessment & Plan Note (Signed)
Patient complains of some generalized abdominal discomfort within the past several days.  He says he feels slightly bloated as well.  He states he had a somewhat normal; but dry bowel movement earlier today.  Patient denies any nausea or vomiting.  Whatsoever. He also denies any recent fevers or chills.  Patient states that he takes 2 hydrocodone tablets every 4 hours on an around-the-clock basis. He does not take any stool softeners or laxity.  Exam reveals bowel sounds positive in all 4 quads; and no [place] tenderness with palpation.  There is also no rebound tenderness.   KUB obtained today revealed: ADDENDUM REPORT: 12/02/2014 16:24 ADDENDUM: Not mentioned above is small-moderate amount of stool in the ascending colon. No evidence of rectal fecal impaction. Electronically Signed  By: Kathreen Devoid  On: 12/02/2014 16:24     Study Result     CLINICAL DATA: The mid and low abdominal pain  EXAM: ABDOMEN - 1 VIEW  COMPARISON: PET-CT 11/03/2014  FINDINGS: The bowel gas pattern is normal. No radio-opaque calculi or other significant radiographic abnormality are seen.  IMPRESSION: Negative.    Most likely,  Patient is moderately constipated.  Patient was advised to take stool softeners twice daily; and also try Miralax laxative.

## 2014-12-03 NOTE — Assessment & Plan Note (Signed)
Patient with mild hyponatremia with sodium 134 today.  Patient was encouraged to push fluids at home is much as possible.

## 2014-12-03 NOTE — Assessment & Plan Note (Signed)
Liver enzymes elevated with AST 38, and ALT 140 today.   Most likely, elevated liver enzymes are secondary to the increased Tylenol patient is consuming with his hydrocodone.We'll continue to monitor closely.

## 2014-12-03 NOTE — Assessment & Plan Note (Signed)
Alkaline phosphatase has increased from 119 up to 293.  Will continue to monitor closely.

## 2014-12-03 NOTE — Telephone Encounter (Signed)
TC to pt wife to give xray and doppler results. Pt wife advised to give stool softeners BID. Wife verbalized an understanding.

## 2014-12-03 NOTE — Assessment & Plan Note (Signed)
Patient has +1 bilateral peripheral edema 2.  His lower ankles and feet. The left ankle is slightly more edematous than the right.  All  Pulses are palpable in all extremities are warm.  There is no calf tenderness.   Doppler ultrasound obtained today of the left lower extremity was negative for DVT.  Patient was advised to elevate his legs above the level of his heart as often as possible; and to remain active.

## 2014-12-03 NOTE — Assessment & Plan Note (Signed)
Albumen has decreased to 2.9.  Patient was encouraged to push protein in his diet is much as possible in the form of meat, , protein shakes, or protein bars.

## 2014-12-03 NOTE — Telephone Encounter (Signed)
Wife is aware of 10/27 appointment as Clarise Cruz is full 10/26

## 2014-12-03 NOTE — Progress Notes (Signed)
SYMPTOM MANAGEMENT CLINIC   HPI: Steven Roberts 49 y.o. male diagnosed with lung cancer with bone metastasis.  Currently undergoing Tarceva oral therapy.plans to initiate radiation treatments on 12/07/2014.  Patient initiated Tarceva oral chemotherapy on 11/26/2014.  His wife reports that patient is having periods of apnea; specifically  At night when he is asleep.  Patient's wife states that patient awakes from sleep coughing ; and sit straight up in the bed.  He appears to gag when he has coughing; and has a tendency 15 second period of apnea. During this episode- patient does not appear aware of his surroundings.  Patient's wife states that she will pat him on the back; and he will awaken and take a deep breath.  This is happening for the last 3 nights in a row.   Patient does deal with chronic pain; and takes hydrocodone 2 tablets every 4 hours on a regular basis.   HPI  ROS  Past Medical History  Diagnosis Date  . Bone cancer ([place]) 11/03/14 Pet scan    left 6th rib, right hip  . Lung cancer ([place]) 10/23/14    LUL adenocarcinoma, non-small cell    Past Surgical History  Procedure Laterality Date  . Video bronchoscopy Bilateral 10/23/2014    Procedure: VIDEO BRONCHOSCOPY WITHOUT FLUORO;  Surgeon: Tanda Rockers, MD;  Location: [facility];  Service: Cardiopulmonary;  Laterality: Bilateral;    has Pulmonary infiltrates  c/w [place]/ Stage IV ; Non-small cell carcinoma of lung, stage 4 (HCC); Bone metastasis ([place]); Encounter for chemotherapy management; Hyperglycemia; Transaminitis; Hyperphosphatemia; Peripheral edema; Rash; Dehydration; Hypoalbuminemia due to protein-calorie malnutrition ([place]); and Pain in the abdomen on his problem list.    has No Known Allergies.    Medication List       This list is accurate as of: 12/02/14 11:59 PM.  Always use your most recent med list.               erlotinib 150 MG tablet  Commonly known as:  TARCEVA  Take 1 tablet (150 mg total) by  mouth daily. Take on an empty stomach 1 hour before meals or 2 hours after.     HYDROcodone-acetaminophen 5-325 MG tablet  Commonly known as:  NORCO/VICODIN  Take 1-2 tablets by mouth every 4 (four) hours as needed for moderate pain (or cough).     ibuprofen 200 MG tablet  Commonly known as:  ADVIL,MOTRIN  Take 200 mg by mouth every 6 (six) hours as needed.     ondansetron 8 MG tablet  Commonly known as:  ZOFRAN  Take 1 tablet (8 mg total) by mouth every 8 (eight) hours as needed for nausea or vomiting.         PHYSICAL EXAMINATION  Oncology Vitals 12/02/2014 11/25/2014 11/19/2014 11/18/2014 11/12/2014 10/23/2014 10/23/2014  Height 173 cm 173 cm - - 173 cm - -  Weight 86.093 kg 81.647 kg - - 81.965 kg - -  Weight (lbs) 189 lbs 13 oz 180 lbs - - 180 lbs 11 oz - -  BMI (kg/m2) 28.86 kg/m2 27.37 kg/m2 - - 27.48 kg/m2 - -  Temp 98.4 99 98.4 98.6 98.8 - -  Pulse 115 89 78 76 92 82 80  Resp 20 20 - - 16 21 26   SpO2 97 96 - - 99 94 94  BSA (m2) 2.03 m2 1.98 m2 - - 1.98 m2 - -   BP Readings from Last 3 Encounters:  12/02/14 120/74  11/25/14 111/72  11/19/14  115/66    Physical Exam  Constitutional: He is oriented to person, place, and time and well-developed, well-nourished, and in no distress.  HENT:  Head: Normocephalic and atraumatic.  Mouth/Throat: Oropharynx is clear and moist.  Eyes: Conjunctivae and EOM are normal. Pupils are equal, round, and reactive to light. Right eye exhibits no discharge. Left eye exhibits no discharge. No scleral icterus.  Neck: Normal range of motion. Neck supple. No JVD present. No tracheal deviation present. No thyromegaly present.  Cardiovascular: Normal rate, regular rhythm, normal heart sounds and intact distal pulses.   Pulmonary/Chest: Effort normal and breath sounds normal. No respiratory distress. He has no wheezes. He has no rales. He exhibits no tenderness.  Dry cough.  Abdominal: Soft. Bowel sounds are normal. He exhibits no distension and no  mass. There is no tenderness. There is no rebound and no guarding.  Patient with no abdominal distention; but, abdomen feels slightly firm.  Musculoskeletal: Normal range of motion. He exhibits edema. He exhibits no tenderness.  +1 edema to bilateral lower extremities; with the left slightly greater than the right.  Lymphadenopathy:    He has no cervical adenopathy.  Neurological: He is alert and oriented to person, place, and time.  Skin: Skin is warm and dry. Rash noted. No erythema.  Trace rash beginning to face and upper portion of anterior chest.  Psychiatric: Affect normal.  Nursing note and vitals reviewed.   LABORATORY DATA:. Appointment on 12/02/2014  Component Date Value Ref Range Status  . WBC 12/02/2014 10.4* 4.0 - 10.3 10e3/uL Final  . NEUT# 12/02/2014 8.1* 1.5 - 6.5 10e3/uL Final  . HGB 12/02/2014 9.8* 13.0 - 17.1 g/dL Final  . HCT 12/02/2014 29.3* 38.4 - 49.9 % Final  . Platelets 12/02/2014 629* 140 - 400 10e3/uL Final  . MCV 12/02/2014 83.7  79.3 - 98.0 fL Final  . MCH 12/02/2014 27.9  27.2 - 33.4 pg Final  . MCHC 12/02/2014 33.3  32.0 - 36.0 g/dL Final  . RBC 12/02/2014 3.50* 4.20 - 5.82 10e6/uL Final  . RDW 12/02/2014 13.6  11.0 - 14.6 % Final  . lymph# 12/02/2014 1.3  0.9 - 3.3 10e3/uL Final  . MONO# 12/02/2014 0.9  0.1 - 0.9 10e3/uL Final  . Eosinophils Absolute 12/02/2014 0.1  0.0 - 0.5 10e3/uL Final  . Basophils Absolute 12/02/2014 0.1  0.0 - 0.1 10e3/uL Final  . NEUT% 12/02/2014 77.6* 39.0 - 75.0 % Final  . LYMPH% 12/02/2014 12.5* 14.0 - 49.0 % Final  . MONO% 12/02/2014 8.5  0.0 - 14.0 % Final  . EOS% 12/02/2014 0.8  0.0 - 7.0 % Final  . BASO% 12/02/2014 0.6  0.0 - 2.0 % Final  . Sodium 12/02/2014 134* 136 - 145 mEq/L Final  . Potassium 12/02/2014 4.6  3.5 - 5.1 mEq/L Final  . Chloride 12/02/2014 100  98 - 109 mEq/L Final  . CO2 12/02/2014 25  22 - 29 mEq/L Final  . Glucose 12/02/2014 267* 70 - 140 mg/dl Final   Glucose reference range is for nonfasting  patients. Fasting glucose reference range is 70- 100.  Marland Kitchen BUN 12/02/2014 17.3  7.0 - 26.0 mg/dL Final  . Creatinine 12/02/2014 0.9  0.7 - 1.3 mg/dL Final  . Total Bilirubin 12/02/2014 1.07  0.20 - 1.20 mg/dL Final  . Alkaline Phosphatase 12/02/2014 293* 40 - 150 U/L Final  . AST 12/02/2014 38* 5 - 34 U/L Final  . ALT 12/02/2014 140* 0 - 55 U/L Final  . Total Protein 12/02/2014  6.8  6.4 - 8.3 g/dL Final  . Albumin 12/02/2014 2.9* 3.5 - 5.0 g/dL Final  . Calcium 12/02/2014 9.6  8.4 - 10.4 mg/dL Final  . Anion Gap 12/02/2014 9  3 - 11 mEq/L Final  . EGFR 12/02/2014 >90  >90 ml/min/1.73 m2 Final   eGFR is calculated using the CKD-EPI Creatinine Equation (2009)   Doppler US: VASCULAR LAB PRELIMINARY PRELIMINARY PRELIMINARY PRELIMINARY  Left lower extremity venous duplex completed.   Preliminary report: Left: No evidence of DVT, superficial thrombosis, or Baker's cyst.  RADIOGRAPHIC STUDIES: Dg Abd 1 View  12/02/2014  ADDENDUM REPORT: 12/02/2014 16:24 ADDENDUM: Not mentioned above is small-moderate amount of stool in the ascending colon. No evidence of rectal fecal impaction. Electronically Signed   By: Kathreen Devoid   On: 12/02/2014 16:24  12/02/2014  CLINICAL DATA:  The mid and low abdominal pain EXAM: ABDOMEN - 1 VIEW COMPARISON:  PET-CT 11/03/2014 FINDINGS: The bowel gas pattern is normal. No radio-opaque calculi or other significant radiographic abnormality are seen. IMPRESSION: Negative. Electronically Signed: By: Kathreen Devoid On: 12/02/2014 15:39    ASSESSMENT/PLAN:    Non-small cell carcinoma of lung, stage 4 ([place]) Patient initiated Tarceva oral chemotherapy on 11/26/2014.  His wife reports that patient is having periods of apnea; specifically  At night when he is asleep.  Patient's wife states that patient awakes from sleep coughing ; and sit straight up in the bed.  He appears to gag when he has coughing; and has a tendency 15 second period of apnea. During this episode-  patient does not appear aware of his surroundings.  Patient's wife states that she will pat him on the back; and he will awaken and take a deep breath.  This is happening for the last 3 nights in a row.   Patient does deal with chronic pain; and takes hydrocodone 2 tablets every 4 hours on a regular basis.    exam reveals lung sounds clear bilaterally; with no wheeze.  Patient does have a dry cough.    blood counts obtained today revealed a WBC of 10.4, ANC 8.1, hemoglobin 9.8, and platelet count 629.    Dr. Julien Nordmann and to review all details with the patient and his wife as well; and he feels that these episodes may be secondary to the increased pain medication. Patient was advised to try taking the pain medications every 6-8 hours instead; and also try alternating with ibuprofen.    Patient was advised to call/return if symptoms continue; as patient may very well need to be evaluated per his pulmonologist Dr. Melvyn Novas.   Also, patient is scheduled to initiate radiation treatments on Monday, 12/07/2014.   Patient was advised to return to the [place] for labs and a follow-up  Visit next Wednesday, 12/09/2014.  Hyperglycemia  Blood sugar was elevated to 267 today.  Previously, blood sugar was elevated to 296. Patient does not take any dexamethasone or other steroidal medication at this time. Patient's wife states that he has been diagnosed as prediabetic in the past. We'll continue to monitor blood sugars closely.  Patient may need to follow with his primary care provider for glucose control if hyperglycemia continues.   Transaminitis  Liver enzymes elevated with AST 38, and ALT 140 today.   Most likely, elevated liver enzymes are secondary to the increased Tylenol patient is consuming with his hydrocodone.We'll continue to monitor closely.  Hyperphosphatemia  Alkaline phosphatase has increased from 119 up to 293.  Will continue to monitor  closely.  Peripheral edema  Patient has +1  bilateral peripheral edema 2.  His lower ankles and feet. The left ankle is slightly more edematous than the right.  All  Pulses are palpable in all extremities are warm.  There is no calf tenderness.   Doppler ultrasound obtained today of the left lower extremity was negative for DVT.  Patient was advised to elevate his legs above the level of his heart as often as possible; and to remain active.  Rash  Patient is on day number 7 of his Tarceva oral therapy.  He has very faint initial rash to his face and his upper anterior chest. Patient was advised that rash is a common side effect with Tarceva; and to let us know if it continues or worsens.  Dehydration  Patient with mild hyponatremia with sodium 134 today.  Patient was encouraged to push fluids at home is much as possible.  Hypoalbuminemia due to protein-calorie malnutrition ([place])  Albumen has decreased to 2.9.  Patient was encouraged to push protein in his diet is much as possible in the form of meat, , protein shakes, or protein bars.  Pain in the abdomen  Patient complains of some generalized abdominal discomfort within the past several days.  He says he feels slightly bloated as well.  He states he had a somewhat normal; but dry bowel movement earlier today.  Patient denies any nausea or vomiting.  Whatsoever. He also denies any recent fevers or chills.  Patient states that he takes 2 hydrocodone tablets every 4 hours on an around-the-clock basis. He does not take any stool softeners or laxity.  Exam reveals bowel sounds positive in all 4 quads; and no [place] tenderness with palpation.  There is also no rebound tenderness.   KUB obtained today revealed: ADDENDUM REPORT: 12/02/2014 16:24 ADDENDUM: Not mentioned above is small-moderate amount of stool in the ascending colon. No evidence of rectal fecal impaction. Electronically Signed  By: Kathreen Devoid  On: 12/02/2014 16:24     Study Result     CLINICAL DATA: The mid  and low abdominal pain  EXAM: ABDOMEN - 1 VIEW  COMPARISON: PET-CT 11/03/2014  FINDINGS: The bowel gas pattern is normal. No radio-opaque calculi or other significant radiographic abnormality are seen.  IMPRESSION: Negative.    Most likely,  Patient is moderately constipated.  Patient was advised to take stool softeners twice daily; and also try Miralax laxative.     Patient stated understanding of all instructions; and was in agreement with this plan of care. The patient knows to call the clinic with any problems, questions or concerns.    This was a shared visit with Dr. Julien Nordmann.   Total time spent with patient was 40 minutes;  with greater than 75 percent of that time spent in face to face counseling regarding patient's symptoms,  and coordination of care and follow up.  Disclaimer:This dictation was prepared with Dragon/digital dictation along with [employer]. Any transcriptional errors that result from this process are unintentional.  Drue Second, NP 12/03/2014   ADDENDUM: Hematology/Oncology Attending: I had a face to face encounter with the patient today. I recommended his care plan. This is a very pleasant 49 years old Hispanic male recently diagnosed with metastatic non-small cell lung cancer, adenocarcinoma with positive EGFR mutation. The patient was started on treatment with targeted therapy with Tarceva 150 mg by mouth daily on 11/26/2014. He is tolerating his treatment well except for mild skin rash on the face. He denied  having any significant diarrhea. He came today to the clinic for evaluation of few episodes of apnea specially during sleep. This was felt to be secondary to overmedication with his pain medicine. I strongly recommended for the patient not to take his pain medication on a scheduled basis and take it only as needed every 6-8 hours. He was also advised to take some ibuprofen if needed for pain management. He also has edema of  the lower extremity and Doppler of the lower extremity showed no evidence for the venous thrombosis. We'll continue to monitor him closely and the patient would come back for follow-up visit next week for reevaluation and repeat blood work. He was advised to call immediately if he has any concerning symptoms in the interval.  Disclaimer: This note was dictated with voice recognition software. Similar sounding words can inadvertently be transcribed and may be missed upon review. Eilleen Kempf., MD 12/03/2014

## 2014-12-03 NOTE — Assessment & Plan Note (Signed)
Patient is on day number 7 of his Tarceva oral therapy.  He has very faint initial rash to his face and his upper anterior chest. Patient was advised that rash is a common side effect with Tarceva; and to let us know if it continues or worsens.

## 2014-12-03 NOTE — Assessment & Plan Note (Signed)
Blood sugar was elevated to 267 today.  Previously, blood sugar was elevated to 296. Patient does not take any dexamethasone or other steroidal medication at this time. Patient's wife states that he has been diagnosed as prediabetic in the past. We'll continue to monitor blood sugars closely.  Patient may need to follow with his primary care provider for glucose control if hyperglycemia continues.

## 2014-12-04 ENCOUNTER — Ambulatory Visit
Admission: RE | Admit: 2014-12-04 | Discharge: 2014-12-04 | Disposition: A | Discharge status: Home / Self Care | Payer: Medicaid Other | Source: Ambulatory Visit | Attending: Radiation Oncology | Admitting: Radiation Oncology

## 2014-12-04 ENCOUNTER — Ambulatory Visit: Admission: RE | Admit: 2014-12-04 | Payer: Medicaid Other | Source: Ambulatory Visit | Admitting: Radiation Oncology

## 2014-12-04 ENCOUNTER — Ambulatory Visit: Payer: Medicaid Other

## 2014-12-07 ENCOUNTER — Encounter: Payer: Self-pay | Admitting: Radiation Oncology

## 2014-12-07 ENCOUNTER — Ambulatory Visit
Admission: RE | Admit: 2014-12-07 | Discharge: 2014-12-07 | Disposition: A | Discharge status: Home / Self Care | Payer: Self-pay | Source: Ambulatory Visit | Attending: Radiation Oncology | Admitting: Radiation Oncology

## 2014-12-07 ENCOUNTER — Ambulatory Visit
Admission: RE | Admit: 2014-12-07 | Discharge: 2014-12-07 | Disposition: A | Discharge status: Home / Self Care | Payer: Medicaid Other | Source: Ambulatory Visit | Attending: Radiation Oncology | Admitting: Radiation Oncology

## 2014-12-07 VITALS — BP 109/73 | HR 103 | Temp 98.3°F | Resp 20

## 2014-12-07 DIAGNOSIS — C3490 Malignant neoplasm of unspecified part of unspecified bronchus or lung: Secondary | ICD-10-CM

## 2014-12-07 DIAGNOSIS — Z510 Encounter for antineoplastic radiation therapy: Secondary | ICD-10-CM | POA: Diagnosis not present

## 2014-12-07 DIAGNOSIS — C7951 Secondary malignant neoplasm of bone: Secondary | ICD-10-CM

## 2014-12-07 NOTE — Progress Notes (Signed)
  Radiation Oncology         [phone] ________________________________  Name: Steven Roberts MRN: [id number]  Date: 11/27/2014  DOB: 28-Oct-1965  SIMULATION AND TREATMENT PLANNING NOTE  DIAGNOSIS:  Metastatic lung cancer with bony metastasis  Site:   1.  Right hip 2.  Left lung/rib metastasis   NARRATIVE:  The patient was brought to the [place].  Identity was confirmed.  All relevant records and images related to the planned course of therapy were reviewed.   Written consent to proceed with treatment was confirmed which was freely given after reviewing the details related to the planned course of therapy had been reviewed with the patient.  Then, the patient was set-up in a stable reproducible  supine position for radiation therapy.  CT images were obtained.  Surface markings were placed.    Medically necessary complex treatment device(s) for immobilization:   Customized  vac lock bag .   The CT images were loaded into the planning software.  Then the target and avoidance structures were contoured.  Treatment planning then occurred.  The radiation prescription was entered and confirmed.  A total of 6plex treatment devices were fabricated which relate to the designed radiation treatment fields:  2 APPA fields for the right hip and 4 fields for the left lung/rib including a reduced field to improve dose homogeneity. Each of these customized fields/ complex treatment devices will be used on a daily basis during the radiation course. I have requested : 3D Simulation  I have requested a DVH of the following structures: Target volume, spinal cord, lungs.   The patient will undergo daily image guidance to ensure accurate localization of the target, and adequate minimize dose to the normal surrounding structures in close proximity to the target.   PLAN:  The patient will receive 37.5y in 15 fractions.    Special treatment procedure The patient will also receive concurrent  chemotherapy during the treatment. The patient may therefore experience increased toxicity or side effects and the patient will be monitored for such problems. This may require extra lab work as necessary. This therefore constitutes a special treatment procedure.  ________________________________   Jodelle Gross, MD, PhD

## 2014-12-07 NOTE — Progress Notes (Signed)
Department of Radiation Oncology  Phone:  [phone] Fax:        [phone]  Weekly Treatment Note    Name: Steven Roberts Date: 12/07/2014 MRN: [id number] DOB: November 07, 1965   Current dose: 2.5 Gy  Current fraction: 1   MEDICATIONS: Current Outpatient Prescriptions  Medication Sig Dispense Refill  . docusate sodium (COLACE) 100 MG capsule Take 100 mg by mouth 2 (two) times daily.    Marland Kitchen erlotinib (TARCEVA) 150 MG tablet Take 1 tablet (150 mg total) by mouth daily. Take on an empty stomach 1 hour before meals or 2 hours after. 30 tablet 2  . HYDROcodone-acetaminophen (NORCO/VICODIN) 5-325 MG tablet Take 1-2 tablets by mouth every 4 (four) hours as needed for moderate pain (or cough). 90 tablet 0  . ibuprofen (ADVIL,MOTRIN) 200 MG tablet Take 200 mg by mouth every 6 (six) hours as needed.    . ondansetron (ZOFRAN) 8 MG tablet Take 1 tablet (8 mg total) by mouth every 8 (eight) hours as needed for nausea or vomiting. 20 tablet 2   No current facility-administered medications for this encounter.    ALLERGIES: Review of patient's allergies indicates no known allergies.  LABORATORY DATA:  Lab Results  Component Value Date   WBC 10.4* 12/02/2014   HGB 9.8* 12/02/2014   HCT 29.3* 12/02/2014   MCV 83.7 12/02/2014   PLT 629* 12/02/2014   Lab Results  Component Value Date   NA 134* 12/02/2014   K 4.6 12/02/2014   CO2 25 12/02/2014   Lab Results  Component Value Date   ALT 140* 12/02/2014   AST 38* 12/02/2014   ALKPHOS 293* 12/02/2014   BILITOT 1.07 12/02/2014    NARRATIVE: Steven Roberts was seen today for weekly treatment management. The chart was checked and the patient's films were reviewed.  Patient has had 1 / 20 rad tx left rung/rib area abd right hip,c/o sob and left leg swelling and bloating abdomen, took vitals 100% room air, taking dulcolax for constipation however is not very effective, abdomnen taut ,says small bowel movements, nop apin, no nausea, will do  patient education tomorrow. Last bowel movement yesterday, a small amount. Spends most of his day in his wheelchair.   PHYSICAL EXAMINATION: oral temperature is 98.3 F (36.8 C). His blood pressure is 109/73 and his pulse is 103. His respiration is 20 and oxygen saturation is 100%.       General: Well-developed, in no acute distress HEENT: Normocephalic, atraumatic Cardiovascular: Regular rate and rhythm Respiratory: Clear to auscultation bilaterally GI: Some apparent bloating in the abdomen. Pain upon soft palpation. Tenderness in epigastric region.  Extremities: 2 + edema present in lower extremities bilaterally   ASSESSMENT: The patient is experiencing significant swelling, constipation and bloating.   PLAN: Patient is having swelling which multifactorial. Patient is unable to raise his legs due to SOB, we discussed that this will improve as he proceeds with treatment  We will continue with the patient's radiation treatment as planned. I advised the patient to begin taking Miralax twice a day and a stool softener. Advised the patient to elevate his feet and reduce the amount of time he spends sitting. Stressed importance of protein in his diet.    ------------------------------------------------  Jodelle Gross, MD, PhD  This document serves as a record of services personally performed by Kyung Rudd, MD. It was created on his behalf by Derek Mound, a trained medical scribe. The creation of this record is based on the scribe's personal observations and  the provider's statements to them. This document has been checked and approved by the attending provider.

## 2014-12-07 NOTE — Progress Notes (Signed)
Patient has had 1 / 20 rad tx left rung/rib area abd right hip,c/o sob and left leg swelling and bloating abdomen, took vitals 100% room air, , taking dulcolax for constipation, abdomnen taut , says small bowel movements, nop apin, no nuaaea, will do patient education tomorrow BP 109/73 mmHg  Pulse 103  Temp(Src) 98.3 F (36.8 C) (Oral)  Resp 20  SpO2 100%  Wt Readings from Last 3 Encounters:  12/02/14 189 lb 12.8 oz (86.093 kg)  11/25/14 180 lb (81.647 kg)  11/12/14 180 lb 11.2 oz (81.965 kg)

## 2014-12-08 ENCOUNTER — Ambulatory Visit
Admission: RE | Admit: 2014-12-08 | Discharge: 2014-12-08 | Disposition: A | Discharge status: Home / Self Care | Payer: Self-pay | Source: Ambulatory Visit | Attending: Radiation Oncology | Admitting: Radiation Oncology

## 2014-12-08 ENCOUNTER — Ambulatory Visit
Admission: RE | Admit: 2014-12-08 | Discharge: 2014-12-08 | Disposition: A | Discharge status: Home / Self Care | Payer: Medicaid Other | Source: Ambulatory Visit | Attending: Radiation Oncology | Admitting: Radiation Oncology

## 2014-12-08 DIAGNOSIS — D6481 Anemia due to antineoplastic chemotherapy: Secondary | ICD-10-CM | POA: Insufficient documentation

## 2014-12-08 DIAGNOSIS — C7951 Secondary malignant neoplasm of bone: Secondary | ICD-10-CM | POA: Insufficient documentation

## 2014-12-08 DIAGNOSIS — R609 Edema, unspecified: Secondary | ICD-10-CM | POA: Insufficient documentation

## 2014-12-08 DIAGNOSIS — Z510 Encounter for antineoplastic radiation therapy: Secondary | ICD-10-CM | POA: Diagnosis not present

## 2014-12-08 DIAGNOSIS — E46 Unspecified protein-calorie malnutrition: Secondary | ICD-10-CM | POA: Insufficient documentation

## 2014-12-08 DIAGNOSIS — E860 Dehydration: Secondary | ICD-10-CM | POA: Insufficient documentation

## 2014-12-08 DIAGNOSIS — C3490 Malignant neoplasm of unspecified part of unspecified bronchus or lung: Secondary | ICD-10-CM | POA: Insufficient documentation

## 2014-12-08 MED ORDER — RADIAPLEXRX EX GEL
Freq: Once | CUTANEOUS | Status: AC
  Administered 2014-12-08: 13:00:00 via TOPICAL

## 2014-12-08 NOTE — Progress Notes (Signed)
Pt education done, radiation therapy and you book, my business card, radiaplex gel cream given,  with patient and wife interpreting, discussed ways to manage side effects, fatigue, wesophus changes, skin irritation,pain, diff swallowing,/burning in throat, , loss appetite, may need to eat smaller meals and snacks, drink plenty water, eat foods hig in calries and protein,,verbal understanding,teach backgiven by wife and patient 1:14 PM

## 2014-12-09 ENCOUNTER — Ambulatory Visit
Admission: RE | Admit: 2014-12-09 | Discharge: 2014-12-09 | Disposition: A | Discharge status: Home / Self Care | Payer: Medicaid Other | Source: Ambulatory Visit | Attending: Radiation Oncology | Admitting: Radiation Oncology

## 2014-12-09 DIAGNOSIS — Z510 Encounter for antineoplastic radiation therapy: Secondary | ICD-10-CM | POA: Diagnosis not present

## 2014-12-10 ENCOUNTER — Telehealth: Payer: Self-pay | Admitting: *Deleted

## 2014-12-10 ENCOUNTER — Encounter: Payer: Self-pay | Admitting: Physician Assistant

## 2014-12-10 ENCOUNTER — Emergency Department (HOSPITAL_COMMUNITY): Payer: Medicaid Other

## 2014-12-10 ENCOUNTER — Other Ambulatory Visit: Payer: Self-pay

## 2014-12-10 ENCOUNTER — Ambulatory Visit (HOSPITAL_BASED_OUTPATIENT_CLINIC_OR_DEPARTMENT_OTHER): Payer: Self-pay | Admitting: Nurse Practitioner

## 2014-12-10 ENCOUNTER — Ambulatory Visit (HOSPITAL_BASED_OUTPATIENT_CLINIC_OR_DEPARTMENT_OTHER): Payer: Self-pay | Admitting: Physician Assistant

## 2014-12-10 ENCOUNTER — Other Ambulatory Visit (HOSPITAL_BASED_OUTPATIENT_CLINIC_OR_DEPARTMENT_OTHER): Payer: Self-pay

## 2014-12-10 ENCOUNTER — Encounter (HOSPITAL_COMMUNITY): Payer: Self-pay | Admitting: Emergency Medicine

## 2014-12-10 ENCOUNTER — Ambulatory Visit: Admission: RE | Admit: 2014-12-10 | Payer: Medicaid Other | Source: Ambulatory Visit

## 2014-12-10 ENCOUNTER — Telehealth: Payer: Self-pay | Admitting: Physician Assistant

## 2014-12-10 ENCOUNTER — Inpatient Hospital Stay (HOSPITAL_COMMUNITY): Payer: Medicaid Other

## 2014-12-10 ENCOUNTER — Ambulatory Visit
Admission: RE | Admit: 2014-12-10 | Discharge: 2014-12-10 | Disposition: A | Discharge status: Home / Self Care | Payer: Medicaid Other | Source: Ambulatory Visit | Attending: Radiation Oncology | Admitting: Radiation Oncology

## 2014-12-10 ENCOUNTER — Inpatient Hospital Stay (HOSPITAL_COMMUNITY)
Admission: EM | Admit: 2014-12-10 | Discharge: 2014-12-20 | DRG: 270 | Disposition: A | Discharge status: Home / Self Care | Payer: Medicaid Other | Attending: Internal Medicine | Admitting: Internal Medicine

## 2014-12-10 VITALS — BP 160/79 | HR 75 | Temp 97.7°F | Resp 18 | Ht 68.0 in | Wt 164.8 lb

## 2014-12-10 DIAGNOSIS — D62 Acute posthemorrhagic anemia: Secondary | ICD-10-CM | POA: Diagnosis not present

## 2014-12-10 DIAGNOSIS — E860 Dehydration: Secondary | ICD-10-CM | POA: Diagnosis present

## 2014-12-10 DIAGNOSIS — E861 Hypovolemia: Secondary | ICD-10-CM | POA: Diagnosis present

## 2014-12-10 DIAGNOSIS — I314 Cardiac tamponade: Secondary | ICD-10-CM | POA: Diagnosis present

## 2014-12-10 DIAGNOSIS — T451X5A Adverse effect of antineoplastic and immunosuppressive drugs, initial encounter: Secondary | ICD-10-CM | POA: Diagnosis present

## 2014-12-10 DIAGNOSIS — Z4682 Encounter for fitting and adjustment of non-vascular catheter: Secondary | ICD-10-CM

## 2014-12-10 DIAGNOSIS — R740 Nonspecific elevation of levels of transaminase and lactic acid dehydrogenase [LDH]: Secondary | ICD-10-CM

## 2014-12-10 DIAGNOSIS — E8809 Other disorders of plasma-protein metabolism, not elsewhere classified: Secondary | ICD-10-CM

## 2014-12-10 DIAGNOSIS — G9340 Encephalopathy, unspecified: Secondary | ICD-10-CM | POA: Diagnosis not present

## 2014-12-10 DIAGNOSIS — C787 Secondary malignant neoplasm of liver and intrahepatic bile duct: Secondary | ICD-10-CM | POA: Diagnosis present

## 2014-12-10 DIAGNOSIS — J9811 Atelectasis: Secondary | ICD-10-CM

## 2014-12-10 DIAGNOSIS — R609 Edema, unspecified: Secondary | ICD-10-CM

## 2014-12-10 DIAGNOSIS — R109 Unspecified abdominal pain: Secondary | ICD-10-CM | POA: Diagnosis present

## 2014-12-10 DIAGNOSIS — R601 Generalized edema: Secondary | ICD-10-CM

## 2014-12-10 DIAGNOSIS — K8000 Calculus of gallbladder with acute cholecystitis without obstruction: Secondary | ICD-10-CM | POA: Diagnosis present

## 2014-12-10 DIAGNOSIS — E8770 Fluid overload, unspecified: Secondary | ICD-10-CM | POA: Diagnosis not present

## 2014-12-10 DIAGNOSIS — E876 Hypokalemia: Secondary | ICD-10-CM | POA: Diagnosis not present

## 2014-12-10 DIAGNOSIS — R7401 Elevation of levels of liver transaminase levels: Secondary | ICD-10-CM

## 2014-12-10 DIAGNOSIS — D696 Thrombocytopenia, unspecified: Secondary | ICD-10-CM | POA: Diagnosis present

## 2014-12-10 DIAGNOSIS — R918 Other nonspecific abnormal finding of lung field: Secondary | ICD-10-CM | POA: Diagnosis present

## 2014-12-10 DIAGNOSIS — Z833 Family history of diabetes mellitus: Secondary | ICD-10-CM | POA: Diagnosis not present

## 2014-12-10 DIAGNOSIS — Z809 Family history of malignant neoplasm, unspecified: Secondary | ICD-10-CM | POA: Diagnosis not present

## 2014-12-10 DIAGNOSIS — E46 Unspecified protein-calorie malnutrition: Secondary | ICD-10-CM

## 2014-12-10 DIAGNOSIS — C3490 Malignant neoplasm of unspecified part of unspecified bronchus or lung: Secondary | ICD-10-CM

## 2014-12-10 DIAGNOSIS — E875 Hyperkalemia: Secondary | ICD-10-CM | POA: Diagnosis not present

## 2014-12-10 DIAGNOSIS — I3139 Other pericardial effusion (noninflammatory): Secondary | ICD-10-CM | POA: Diagnosis present

## 2014-12-10 DIAGNOSIS — C7951 Secondary malignant neoplasm of bone: Secondary | ICD-10-CM | POA: Diagnosis present

## 2014-12-10 DIAGNOSIS — E222 Syndrome of inappropriate secretion of antidiuretic hormone: Secondary | ICD-10-CM | POA: Diagnosis present

## 2014-12-10 DIAGNOSIS — J939 Pneumothorax, unspecified: Secondary | ICD-10-CM

## 2014-12-10 DIAGNOSIS — R748 Abnormal levels of other serum enzymes: Secondary | ICD-10-CM | POA: Diagnosis present

## 2014-12-10 DIAGNOSIS — I313 Pericardial effusion (noninflammatory): Secondary | ICD-10-CM | POA: Diagnosis present

## 2014-12-10 DIAGNOSIS — R21 Rash and other nonspecific skin eruption: Secondary | ICD-10-CM | POA: Diagnosis present

## 2014-12-10 DIAGNOSIS — E119 Type 2 diabetes mellitus without complications: Secondary | ICD-10-CM

## 2014-12-10 DIAGNOSIS — W19XXXA Unspecified fall, initial encounter: Secondary | ICD-10-CM

## 2014-12-10 DIAGNOSIS — R1013 Epigastric pain: Secondary | ICD-10-CM | POA: Diagnosis present

## 2014-12-10 DIAGNOSIS — D72829 Elevated white blood cell count, unspecified: Secondary | ICD-10-CM | POA: Diagnosis present

## 2014-12-10 DIAGNOSIS — C3412 Malignant neoplasm of upper lobe, left bronchus or lung: Secondary | ICD-10-CM | POA: Diagnosis present

## 2014-12-10 DIAGNOSIS — C3492 Malignant neoplasm of unspecified part of left bronchus or lung: Secondary | ICD-10-CM

## 2014-12-10 DIAGNOSIS — J90 Pleural effusion, not elsewhere classified: Secondary | ICD-10-CM

## 2014-12-10 DIAGNOSIS — D630 Anemia in neoplastic disease: Secondary | ICD-10-CM | POA: Diagnosis present

## 2014-12-10 DIAGNOSIS — E1165 Type 2 diabetes mellitus with hyperglycemia: Secondary | ICD-10-CM | POA: Diagnosis present

## 2014-12-10 DIAGNOSIS — E871 Hypo-osmolality and hyponatremia: Secondary | ICD-10-CM

## 2014-12-10 DIAGNOSIS — Z9689 Presence of other specified functional implants: Secondary | ICD-10-CM

## 2014-12-10 DIAGNOSIS — D6481 Anemia due to antineoplastic chemotherapy: Secondary | ICD-10-CM

## 2014-12-10 DIAGNOSIS — R739 Hyperglycemia, unspecified: Secondary | ICD-10-CM | POA: Diagnosis present

## 2014-12-10 DIAGNOSIS — Z510 Encounter for antineoplastic radiation therapy: Secondary | ICD-10-CM | POA: Diagnosis present

## 2014-12-10 HISTORY — DX: Hypo-osmolality and hyponatremia: E87.1

## 2014-12-10 HISTORY — DX: Cardiac tamponade: I31.4

## 2014-12-10 HISTORY — DX: Secondary malignant neoplasm of bone: C79.51

## 2014-12-10 HISTORY — DX: Other disorders of phosphorus metabolism: E83.39

## 2014-12-10 HISTORY — DX: Other nonspecific abnormal finding of lung field: R91.8

## 2014-12-10 HISTORY — DX: Pericardial effusion (noninflammatory): I31.3

## 2014-12-10 HISTORY — DX: Unspecified protein-calorie malnutrition: E46

## 2014-12-10 HISTORY — DX: Malignant neoplasm of unspecified part of unspecified bronchus or lung: C34.90

## 2014-12-10 LAB — COMPREHENSIVE METABOLIC PANEL (CC13)
ALT: 540 U/L (ref 0–55)
Albumin: 3 g/dL — ABNORMAL LOW (ref 3.5–5.0)
Alkaline Phosphatase: 227 U/L — ABNORMAL HIGH (ref 40–150)
Anion Gap: 8 mEq/L (ref 3–11)
BUN: 41.3 mg/dL — AB (ref 7.0–26.0)
CHLORIDE: 95 meq/L — AB (ref 98–109)
CO2: 23 mEq/L (ref 22–29)
Calcium: 8.9 mg/dL (ref 8.4–10.4)
Creatinine: 1.2 mg/dL (ref 0.7–1.3)
EGFR: 71 mL/min/{1.73_m2} — ABNORMAL LOW (ref >90)
GLUCOSE: 289 mg/dL — AB (ref 70–140)
POTASSIUM: 4.8 meq/L (ref 3.5–5.1)
SODIUM: 127 meq/L — AB (ref 136–145)
Total Bilirubin: 3.13 mg/dL — ABNORMAL HIGH (ref 0.20–1.20)
Total Protein: 6.5 g/dL (ref 6.4–8.3)

## 2014-12-10 LAB — CBC WITH DIFFERENTIAL/PLATELET
BASO%: 0.2 % (ref 0.0–2.0)
BASOS ABS: 0 10*3/uL (ref 0.0–0.1)
Basophils Absolute: 0 10*3/uL (ref 0.0–0.1)
Basophils Relative: 0 %
EOS PCT: 0 %
EOS%: 0.2 % (ref 0.0–7.0)
Eosinophils Absolute: 0 10*3/uL (ref 0.0–0.5)
Eosinophils Absolute: 0 10*3/uL (ref 0.0–0.7)
HCT: 33.1 % — ABNORMAL LOW (ref 38.4–49.9)
HCT: 33.7 % — ABNORMAL LOW (ref 39.0–52.0)
HGB: 10.8 g/dL — ABNORMAL LOW (ref 13.0–17.1)
Hemoglobin: 11.1 g/dL — ABNORMAL LOW (ref 13.0–17.0)
LYMPH%: 6.2 % — AB (ref 14.0–49.0)
LYMPHS ABS: 1 10*3/uL (ref 0.7–4.0)
LYMPHS PCT: 7 %
MCH: 27.8 pg (ref 27.2–33.4)
MCH: 28 pg (ref 26.0–34.0)
MCHC: 32.6 g/dL (ref 32.0–36.0)
MCHC: 32.9 g/dL (ref 30.0–36.0)
MCV: 85.3 fL (ref 79.3–98.0)
MCV: 84.9 fL (ref 78.0–100.0)
MONO ABS: 1.3 10*3/uL — AB (ref 0.1–1.0)
MONO#: 1.1 10*3/uL — ABNORMAL HIGH (ref 0.1–0.9)
MONO%: 8.6 % (ref 0.0–14.0)
Monocytes Relative: 9 %
NEUT#: 11.3 10*3/uL — ABNORMAL HIGH (ref 1.5–6.5)
NEUT%: 84.8 % — AB (ref 39.0–75.0)
NEUTROS ABS: 11.6 10*3/uL — AB (ref 1.7–7.7)
Neutrophils Relative %: 84 %
PLATELETS: 546 10*3/uL — AB (ref 150–400)
Platelets: 532 10*3/uL — ABNORMAL HIGH (ref 140–400)
RBC: 3.88 10*6/uL — ABNORMAL LOW (ref 4.20–5.82)
RBC: 3.97 MIL/uL — AB (ref 4.22–5.81)
RDW: 16.1 % — ABNORMAL HIGH (ref 11.0–14.6)
RDW: 15.8 % — ABNORMAL HIGH (ref 11.5–15.5)
WBC: 13.3 10*3/uL — ABNORMAL HIGH (ref 4.0–10.3)
WBC: 13.9 10*3/uL — AB (ref 4.0–10.5)
lymph#: 0.8 10*3/uL — ABNORMAL LOW (ref 0.9–3.3)

## 2014-12-10 LAB — URINALYSIS, ROUTINE W REFLEX MICROSCOPIC
Bilirubin Urine: NEGATIVE
GLUCOSE, UA: NEGATIVE mg/dL
Ketones, ur: NEGATIVE mg/dL
LEUKOCYTES UA: NEGATIVE
Nitrite: NEGATIVE
PH: 6 (ref 5.0–8.0)
Protein, ur: 100 mg/dL — AB
SPECIFIC GRAVITY, URINE: 1.022 (ref 1.005–1.030)
Urobilinogen, UA: 0.2 mg/dL (ref 0.0–1.0)

## 2014-12-10 LAB — COMPREHENSIVE METABOLIC PANEL
ALT: 505 U/L — AB (ref 17–63)
AST: 169 U/L — ABNORMAL HIGH (ref 15–41)
Albumin: 3.4 g/dL — ABNORMAL LOW (ref 3.5–5.0)
Alkaline Phosphatase: 218 U/L — ABNORMAL HIGH (ref 38–126)
Anion gap: 10 (ref 5–15)
BILIRUBIN TOTAL: 3.3 mg/dL — AB (ref 0.3–1.2)
BUN: 42 mg/dL — AB (ref 6–20)
CALCIUM: 8.7 mg/dL — AB (ref 8.9–10.3)
CO2: 23 mmol/L (ref 22–32)
CREATININE: 1.11 mg/dL (ref 0.61–1.24)
Chloride: 93 mmol/L — ABNORMAL LOW (ref 101–111)
Glucose, Bld: 231 mg/dL — ABNORMAL HIGH (ref 65–99)
Potassium: 4.6 mmol/L (ref 3.5–5.1)
Sodium: 126 mmol/L — ABNORMAL LOW (ref 135–145)
TOTAL PROTEIN: 7.3 g/dL (ref 6.5–8.1)

## 2014-12-10 LAB — SODIUM, URINE, RANDOM: Sodium, Ur: <10 mmol/L

## 2014-12-10 LAB — URINE MICROSCOPIC-ADD ON

## 2014-12-10 LAB — LIPASE, BLOOD: LIPASE: 51 U/L (ref 11–51)

## 2014-12-10 LAB — ACETAMINOPHEN LEVEL

## 2014-12-10 LAB — PROTIME-INR
INR: 1.46 (ref 0.00–1.49)
Prothrombin Time: 17.9 seconds — ABNORMAL HIGH (ref 11.6–15.2)

## 2014-12-10 LAB — LACTIC ACID, PLASMA: LACTIC ACID, VENOUS: 2.8 mmol/L — AB (ref 0.5–2.0)

## 2014-12-10 LAB — CREATININE, URINE, RANDOM: Creatinine, Urine: 167.68 mg/dL

## 2014-12-10 MED ORDER — ONDANSETRON HCL 4 MG/2ML IJ SOLN
4.0000 mg | Freq: Four times a day (QID) | INTRAMUSCULAR | Status: DC | PRN
  Administered 2014-12-11: 4 mg via INTRAVENOUS
  Filled 2014-12-10: qty 2

## 2014-12-10 MED ORDER — OXYCODONE HCL 5 MG PO TABS
5.0000 mg | ORAL_TABLET | ORAL | Status: DC | PRN
  Administered 2014-12-12 – 2014-12-20 (×22): 5 mg via ORAL
  Filled 2014-12-10 (×22): qty 1

## 2014-12-10 MED ORDER — MORPHINE SULFATE (PF) 2 MG/ML IV SOLN
1.0000 mg | INTRAVENOUS | Status: DC | PRN
  Administered 2014-12-10 – 2014-12-11 (×2): 1 mg via INTRAVENOUS
  Filled 2014-12-10 (×2): qty 1

## 2014-12-10 MED ORDER — HEPARIN SODIUM (PORCINE) 5000 UNIT/ML IJ SOLN
5000.0000 [IU] | Freq: Three times a day (TID) | INTRAMUSCULAR | Status: DC
  Filled 2014-12-10 (×3): qty 1

## 2014-12-10 MED ORDER — INSULIN ASPART 100 UNIT/ML ~~LOC~~ SOLN
0.0000 [IU] | Freq: Three times a day (TID) | SUBCUTANEOUS | Status: DC
  Administered 2014-12-11: 3 [IU] via SUBCUTANEOUS
  Administered 2014-12-12: 2 [IU] via SUBCUTANEOUS
  Administered 2014-12-12 (×2): 3 [IU] via SUBCUTANEOUS
  Administered 2014-12-13 (×2): 2 [IU] via SUBCUTANEOUS
  Administered 2014-12-13: 1 [IU] via SUBCUTANEOUS
  Administered 2014-12-14: 3 [IU] via SUBCUTANEOUS
  Administered 2014-12-14 – 2014-12-15 (×4): 2 [IU] via SUBCUTANEOUS
  Administered 2014-12-16: 1 [IU] via SUBCUTANEOUS
  Administered 2014-12-16: 2 [IU] via SUBCUTANEOUS
  Administered 2014-12-16: 1 [IU] via SUBCUTANEOUS
  Administered 2014-12-17: 5 [IU] via SUBCUTANEOUS
  Administered 2014-12-17: 3 [IU] via SUBCUTANEOUS
  Administered 2014-12-18: 1 [IU] via SUBCUTANEOUS
  Administered 2014-12-18: 2 [IU] via SUBCUTANEOUS
  Administered 2014-12-19 – 2014-12-20 (×3): 1 [IU] via SUBCUTANEOUS
  Administered 2014-12-20: 3 [IU] via SUBCUTANEOUS

## 2014-12-10 MED ORDER — ONDANSETRON HCL 4 MG PO TABS: 4.0000 mg | ORAL_TABLET | Freq: Four times a day (QID) | ORAL | Status: DC | PRN

## 2014-12-10 MED ORDER — SODIUM CHLORIDE 0.9 % IJ SOLN: 3.0000 mL | INTRAMUSCULAR | Status: DC | PRN

## 2014-12-10 MED ORDER — AMPICILLIN-SULBACTAM SODIUM 3 (2-1) G IJ SOLR
3.0000 g | Freq: Four times a day (QID) | INTRAMUSCULAR | Status: DC
  Administered 2014-12-10 – 2014-12-11 (×3): 3 g via INTRAVENOUS
  Filled 2014-12-10 (×7): qty 3

## 2014-12-10 MED ORDER — SODIUM CHLORIDE 0.9 % IV SOLN
1.5000 g | Freq: Once | INTRAVENOUS | Status: AC
  Administered 2014-12-10: 1.5 g via INTRAVENOUS
  Filled 2014-12-10: qty 1.5

## 2014-12-10 MED ORDER — SODIUM CHLORIDE 0.9 % IV SOLN: 250.0000 mL | INTRAVENOUS | Status: DC | PRN

## 2014-12-10 MED ORDER — SODIUM CHLORIDE 0.9 % IV SOLN
1000.0000 mL | INTRAVENOUS | Status: DC
  Administered 2014-12-10: 1000 mL via INTRAVENOUS

## 2014-12-10 MED ORDER — SODIUM CHLORIDE 0.9 % IJ SOLN
3.0000 mL | Freq: Two times a day (BID) | INTRAMUSCULAR | Status: DC
  Administered 2014-12-10 – 2014-12-20 (×15): 3 mL via INTRAVENOUS

## 2014-12-10 MED ORDER — DOCUSATE SODIUM 100 MG PO CAPS
100.0000 mg | ORAL_CAPSULE | Freq: Two times a day (BID) | ORAL | Status: DC
  Administered 2014-12-10 – 2014-12-20 (×13): 100 mg via ORAL
  Filled 2014-12-10 (×15): qty 1

## 2014-12-10 MED ORDER — BOOST / RESOURCE BREEZE PO LIQD: 1.0000 | Freq: Three times a day (TID) | ORAL | Status: DC

## 2014-12-10 MED ORDER — SODIUM CHLORIDE 0.9 % IV SOLN
1000.0000 mL | Freq: Once | INTRAVENOUS | Status: AC
  Administered 2014-12-10: 1000 mL via INTRAVENOUS

## 2014-12-10 NOTE — Telephone Encounter (Signed)
Sharene Butters, NP called from Dr. Julien Nordmann office, statsing, abnormal labs liver functions from the [facility] pateint is taking requested he come up to their office ,patient  needs to go to the ED and possibly will be admitted,"escorted patient via w/c  and wife at side  to symptom management , handed them to Sharene Butters 12:13 PM

## 2014-12-10 NOTE — Telephone Encounter (Signed)
Addendum from my previous telephone note , I meant to write Jethro Bolus, LPN,  That had called earlier to get patient to see Med/Onc and go to the ED for abnomal liver functions, not Sharene Butters, NO, apologized to Bascom Levels, for the error in the name 3:03 PM'

## 2014-12-10 NOTE — Progress Notes (Signed)
Office Follow Up  HPI   Steven Roberts 49 y.o. male diagnosed with lung cancer with bone metastasis. Currently undergoing Tarceva oral therapy since 10/13. He has began radiation treatments on 12/08/2014 . He has reported generalized fatigue since beginning radiation. He also reports mild facial rash, without open lesions. He denies mucositis. He reports increased leg swelling . He has not been ambulating due to fatigue. He denies any nausea or vomiting. He has occasional loosse stools. His appetite is normal. He denies any bleeding issues. Patient does deal with chronic pain; and takes hydrocodone 2 tablets every 4 hours on a regular basis. He has been following instructions n frequency and dosing, without any further apneic episodes.   ROS  Past Medical History  Diagnosis Date  . Bone cancer ([place]) 11/03/14 Pet scan    left 6th rib, right hip  . Lung cancer ([place]) 10/23/14    LUL adenocarcinoma, non-small cell    Past Surgical History  Procedure Laterality Date  . Video bronchoscopy Bilateral 10/23/2014    Procedure: VIDEO BRONCHOSCOPY WITHOUT FLUORO;  Surgeon: Tanda Rockers, MD;  Location: [facility];  Service: Cardiopulmonary;  Laterality: Bilateral;    has No Known Allergies.    Medication List       This list is accurate as of: 12/10/14 10:36 AM.  Always use your most recent med list.               docusate sodium 100 MG capsule  Commonly known as:  COLACE  Take 100 mg by mouth 2 (two) times daily.     erlotinib 150 MG tablet  Commonly known as:  TARCEVA  Take 1 tablet (150 mg total) by mouth daily. Take on an empty stomach 1 hour before meals or 2 hours after.     hyaluronate sodium Gel  Apply 1 application topically daily.     HYDROcodone-acetaminophen 5-325 MG tablet  Commonly known as:  NORCO/VICODIN  Take 1-2 tablets by mouth every 4 (four) hours as needed for moderate pain (or cough).     ibuprofen 200 MG tablet  Commonly known as:  ADVIL,MOTRIN  Take 200  mg by mouth every 6 (six) hours as needed.     ondansetron 8 MG tablet  Commonly known as:  ZOFRAN  Take 1 tablet (8 mg total) by mouth every 8 (eight) hours as needed for nausea or vomiting.         PHYSICAL EXAMINATION  Oncology Vitals 12/10/2014 12/07/2014 12/02/2014 11/25/2014 11/19/2014 11/18/2014 11/12/2014  Height 173 cm - 173 cm 173 cm - - 173 cm  Weight 89.948 kg - 86.093 kg 81.647 kg - - 81.965 kg  Weight (lbs) 198 lbs 5 oz - 189 lbs 13 oz 180 lbs - - 180 lbs 11 oz  BMI (kg/m2) 30.15 kg/m2 - 28.86 kg/m2 27.37 kg/m2 - - 27.48 kg/m2  Temp 98.3 98.3 98.4 99 98.4 98.6 98.8  Pulse 101 103 115 89 78 76 92  Resp _0 - - 16  SpO2 97 100 97 96 - - 99  BSA (m2) 2.08 m2 - 2.03 m2 1.98 m2 - - 1.98 m2   BP Readings from Last 3 Encounters:  12/10/14 114/75  12/07/14 109/73  12/02/14 120/74    Physical Exam  Constitutional: He is oriented to person, place, and time and well-developed, well-nourished, and in no distress.  Chronically ill appearig  HENT:  Head: Normocephalic and atraumatic.  Mouth/Throat: Oropharynx is clear and moist.  Eyes:  Conjunctivae and EOM are normal. Pupils are equal, round, and reactive to light. Right eye exhibits no discharge. Left eye exhibits no discharge. No scleral icterus.  Neck: Normal range of motion. Neck supple. No JVD present. No tracheal deviation present. No thyromegaly present.  Cardiovascular: Normal rate, regular rhythm, normal heart sounds and intact distal pulses.   Pulmonary/Chest: Effort normal and breath sounds normal. No respiratory distress. He has no decreased breath sounds. He has no wheezes. He has no rhonchi. He has no rales. He exhibits no tenderness.  Dry cough.  Abdominal: Soft. Bowel sounds are normal. He exhibits no distension and no mass. There is no tenderness. There is no rebound and no guarding.  Patient with no abdominal distention; but, abdomen feels slightly firm.  Musculoskeletal: Normal range of motion. He  exhibits edema. He exhibits no tenderness.  +1 edema to bilateral lower extremities; with the left slightly greater than the right.  Lymphadenopathy:    He has no cervical adenopathy.  Neurological: He is alert and oriented to person, place, and time.  Skin: Skin is warm and dry. Rash noted. No erythema.  Trace rash beginning to face and upper portion of anterior chest.  Psychiatric: Affect normal.  Nursing note and vitals reviewed.   LABORATORY DATA:.  CBC Latest Ref Rng 12/02/2014 11/12/2014  WBC 4.0 - 10.3 10e3/uL 10.4(H) 9.0  Hemoglobin 13.0 - 17.1 g/dL 9.8(L) 12.8(L)  Hematocrit 38.4 - 49.9 % 29.3(L) 38.6  Platelets 140 - 400 10e3/uL 629(H) 429(H)   CMP Latest Ref Rng 12/02/2014 11/12/2014  Glucose 70 - 140 mg/dl 267(H) 296(H)  BUN 7.0 - 26.0 mg/dL 17.3 11.0  Creatinine 0.7 - 1.3 mg/dL 0.9 0.9  Sodium 136 - 145 mEq/L 134(L) 136  Potassium 3.5 - 5.1 mEq/L 4.6 3.9  CO2 22 - 29 mEq/L 25 28  Calcium 8.4 - 10.4 mg/dL 9.6 9.6  Total Protein 6.4 - 8.3 g/dL 6.8 7.3  Total Bilirubin 0.20 - 1.20 mg/dL 1.07 0.48  Alkaline Phos 40 - 150 U/L 293(H) 119  AST 5 - 34 U/L 38(H) 18  ALT 0 - 55 U/L 140(H) 29     RADIOGRAPHIC STUDIES: No results found.  ASSESSMENT/PLAN:   This is a very pleasant 49 years old Hispanic male recently diagnosed with  Metastatic non-small cell lung cancer, adenocarcinoma with positive EGFR mutation.  The patient was started on treatment with targeted therapy with Tarceva 150 mg by mouth daily on 11/26/2014. He was tolerating his treatment well except for mild skin rash on the face. He denied having any significant diarrhea. He also has edema of the lower extremity and Doppler of the lower extremity showed no evidence for the venous thrombosis. Will provide a prescription for Lasix to take daily as needed for edema. His vitals are stable. His creatinine is normal He was instructed to call the [facility] in the morning if symptoms do not improve or worsen, at  which time he will be seen at the symptom management clinic His known rash will be treated topically as priorly instructed We'll continue to monitor him closely and the patient would come back for follow-up visit next week with Dr. Julien Nordmann for reevaluation and repeat blood work. He was advised to call immediately if he has any concerning symptoms in the interval.   Kyrin Gratz E, PA-C 12/10/2014

## 2014-12-10 NOTE — ED Notes (Signed)
Pt presents from the [facility] with lung CA. Was supposed to start radiation treatment today but BUN and WBC found to be elevated. MD thinks possible biliary obstruction and possible need for surgery. No other c/c.

## 2014-12-10 NOTE — ED Notes (Signed)
Bed: LN79 Expected date:  Expected time:  Means of arrival:  Comments: Cancer center

## 2014-12-10 NOTE — ED Notes (Signed)
Patient transported to X-ray 

## 2014-12-10 NOTE — ED Provider Notes (Signed)
CSN: [id number]     Arrival date & time 12/10/14  1434 History   First MD Initiated Contact with Patient 12/10/14 1448     Chief Complaint  Patient presents with  . Possible biliary obstruction   . Lung Cancer  . Cough  . Abnormal Labs      (Consider location/radiation/quality/duration/timing/severity/associated sxs/prior Treatment) Patient is a 49 y.o. male presenting with abdominal pain.  Abdominal Pain Pain location:  Generalized Pain quality: bloating   Pain quality: no pressure   Pain radiates to:  Back Pain severity:  Mild Onset quality:  Gradual Timing:  Constant Progression:  Worsening Chronicity:  New Context: not alcohol use and not previous surgeries   Relieved by:  None tried Worsened by:  Nothing tried Ineffective treatments:  None tried Associated symptoms: constipation, diarrhea and fatigue   Associated symptoms: no chills, no fever, no nausea, no shortness of breath and no vomiting     Past Medical History  Diagnosis Date  . Bone cancer ([place]) 11/03/14 Pet scan    left 6th rib, right hip  . Lung cancer ([place]) 10/23/14    LUL adenocarcinoma, non-small cell   Past Surgical History  Procedure Laterality Date  . Video bronchoscopy Bilateral 10/23/2014    Procedure: VIDEO BRONCHOSCOPY WITHOUT FLUORO;  Surgeon: Tanda Rockers, MD;  Location: [facility];  Service: Cardiopulmonary;  Laterality: Bilateral;   Family History  Problem Relation Age of Onset  . Cancer Father     Prostate cancer  . Diabetes Mother    Social History  Substance Use Topics  . Smoking status: Never Smoker   . Smokeless tobacco: Never Used  . Alcohol Use: No    Review of Systems  Constitutional: Positive for fatigue. Negative for fever and chills.  Eyes: Negative for photophobia and pain.  Respiratory: Negative for shortness of breath.   Gastrointestinal: Positive for abdominal pain, diarrhea, constipation and abdominal distention. Negative for nausea, vomiting and blood in stool.   Musculoskeletal: Positive for back pain. Negative for neck pain.  All other systems reviewed and are negative.     Allergies  Review of patient's allergies indicates no known allergies.  Home Medications   Prior to Admission medications   Medication Sig Start Date End Date Taking? Authorizing Provider  docusate sodium (COLACE) 100 MG capsule Take 100 mg by mouth 2 (two) times daily.    Historical Provider, MD  erlotinib (TARCEVA) 150 MG tablet Take 1 tablet (150 mg total) by mouth daily. Take on an empty stomach 1 hour before meals or 2 hours after. 11/12/14   Curt Bears, MD  hyaluronate sodium (RADIAPLEXRX) GEL Apply 1 application topically daily. 12/08/14   Kyung Rudd, MD  HYDROcodone-acetaminophen (NORCO/VICODIN) 5-325 MG tablet Take 1-2 tablets by mouth every 4 (four) hours as needed for moderate pain (or cough). 11/25/14   Kyung Rudd, MD  ibuprofen (ADVIL,MOTRIN) 200 MG tablet Take 200 mg by mouth every 6 (six) hours as needed.    Historical Provider, MD  ondansetron (ZOFRAN) 8 MG tablet Take 1 tablet (8 mg total) by mouth every 8 (eight) hours as needed for nausea or vomiting. 12/02/14   Susanne Borders, NP   BP 117/77 mmHg  Pulse 98  Temp(Src) 97.8 F (36.6 C) (Oral)  Resp 24  SpO2 98% Physical Exam  Constitutional: He is oriented to person, place, and time. He appears well-developed and well-nourished.  HENT:  Head: Normocephalic and atraumatic.  Eyes: Conjunctivae and EOM are normal.  Neck: Normal range  of motion. Neck supple.  Cardiovascular: Normal rate and regular rhythm.   Pulmonary/Chest: Effort normal. Tachypnea noted. No respiratory distress.  Abdominal: Soft. There is tenderness (ruq, epigastric, suprapubic).  Musculoskeletal: Normal range of motion. He exhibits no edema or tenderness.  Neurological: He is alert and oriented to person, place, and time.  Skin: Skin is warm and dry.  Nursing note and vitals reviewed.   ED Course  Procedures (including  critical care time) Labs Review Labs Reviewed  CBC WITH DIFFERENTIAL/PLATELET - Abnormal; Notable for the following:    WBC 13.9 (*)    RBC 3.97 (*)    Hemoglobin 11.1 (*)    HCT 33.7 (*)    RDW 15.8 (*)    Platelets 546 (*)    Neutro Abs 11.6 (*)    Monocytes Absolute 1.3 (*)    All other components within normal limits  COMPREHENSIVE METABOLIC PANEL - Abnormal; Notable for the following:    Sodium 126 (*)    Chloride 93 (*)    Glucose, Bld 231 (*)    BUN 42 (*)    Calcium 8.7 (*)    Albumin 3.4 (*)    AST 169 (*)    ALT 505 (*)    Alkaline Phosphatase 218 (*)    Total Bilirubin 3.3 (*)    All other components within normal limits  LACTIC ACID, PLASMA - Abnormal; Notable for the following:    Lactic Acid, Venous 2.8 (*)    All other components within normal limits  PROTIME-INR - Abnormal; Notable for the following:    Prothrombin Time 17.9 (*)    All other components within normal limits  URINALYSIS, ROUTINE W REFLEX MICROSCOPIC (NOT AT [facility]) - Abnormal; Notable for the following:    Color, Urine AMBER (*)    APPearance CLOUDY (*)    Hgb urine dipstick TRACE (*)    Protein, ur 100 (*)    All other components within normal limits  URINE MICROSCOPIC-ADD ON - Abnormal; Notable for the following:    Casts HYALINE CASTS (*)    All other components within normal limits  ACETAMINOPHEN LEVEL - Abnormal; Notable for the following:    Acetaminophen (Tylenol), Serum <5 (*)    All other components within normal limits  LIPASE, BLOOD  SODIUM, URINE, RANDOM  CREATININE, URINE, RANDOM  HEMOGLOBIN A1C  COMPREHENSIVE METABOLIC PANEL  CBC    Imaging Review Dg Chest 2 View  12/10/2014  CLINICAL DATA:  Cough and shortness of breath. Current diagnosis of lung cancer. EXAM: CHEST  2 VIEW COMPARISON:  PET-CT dated 11/03/2014 FINDINGS: Cardiomediastinal silhouette is enlarged. Mediastinal contours is obscured by a large left perihilar mass. There are bilateral pleural effusions with  associated subsegmental bibasilar atelectasis. Osseous structures are without acute abnormality. Soft tissues are grossly normal. IMPRESSION: Large left perihilar mass, consistent with known primary lung malignancy. Bilateral pleural effusions, moderate in size. Enlarged cardiac silhouette. Pericardial effusion cannot be excluded. Electronically Signed   By: Fidela Salisbury M.D.   On: 12/10/2014 15:21   US Abdomen Limited  12/10/2014  CLINICAL DATA:  Evaluate for cholecystitis/cause of hepatitis EXAM: US ABDOMEN LIMITED - RIGHT UPPER QUADRANT COMPARISON:  None. FINDINGS: Gallbladder: Multiple gallstones within the partially full gallbladder. The wall is circumferentially thickened to 6 mm. No pericholecystic edema. Sonographer reports focal tenderness. Common bile duct: Diameter: 2 mm Liver: Three echogenic masses within the liver, ranging between 16 and 24 mm in maximal diameter, without hypoechoic halo. Antegrade flow in the imaged  portal venous system. Right pleural effusion, small by chest radiography. IMPRESSION: 1. At least 3 liver masses, primarily concerning for lung cancer metastases. 2. Cholelithiasis with conflicting findings for acute cholecystitis. There is gallbladder wall thickening and focal tenderness, but the gallbladder is not dilated as usually seen with cystic duct obstruction and acute cholecystitis. 3. Right pleural effusion. Electronically Signed   By: Monte Fantasia M.D.   On: 12/10/2014 16:12   I have personally reviewed and evaluated these images and lab results as part of my medical decision-making.   EKG Interpretation None      MDM   Final diagnoses:  Elevated transaminase level  Abdominal pain   Patient with mild abdominal pain and significant epigastric right upper quadrant suprapubic abdominal tenderness. Found to have elevated transaminases and came here for evaluation. Still elevated here ultrasound equivocal for cholecystitis likely need a HIDA scan.  Discussed case with hospitalist who will admit the patient to get a HIDA scan and requested surgery consult and Unasyn dose. Patient stable prior to admission.      Merrily Pew, MD 12/10/14 [phone]

## 2014-12-10 NOTE — Progress Notes (Signed)
Addendum: Transaminases returned severely abnormal. AST 179, ALT 540 He is symptomatic He was instructed to return to office, will likely direct admit from the [facility] for inpatient symptom management Patient informed and agreed with plan

## 2014-12-10 NOTE — H&P (Signed)
[facility] History and Physical  Durant Scibilia FMB:[id number] DOB: 02/02/1966 DOA: 12/10/2014  Referring physician: ED PCP: Pcp Not In System   Chief Complaint: abdominal pain/   HPI: Steven Roberts is a 49 y.o. male Hispanic, with recent diagnosis of lung cancer 10-2014, metastasis diseases to lung. He has been receiving tarceva and radiation treatments. He went to see his oncologist today for regular follow up and was found to have abnormal labs and was refer to ED for further evaluation. He has been having abdominal pain, epigastric,RU quadrant  and supra pubic pain for 1 week. He has had poor oral intake. He report epigastric pain with meals and early satiety. Denies vomiting, diarrhea. Had small Bowel movement yesterday.   He report Lower extremity edema and abdominal distension for 1 week. He also report orthopnea and dyspnea on exertion.   Review of Systems:  Negative, except as per HPI  Past Medical History  Diagnosis Date  . Bone cancer ([place]) 11/03/14 Pet scan    left 6th rib, right hip  . Lung cancer ([place]) 10/23/14    LUL adenocarcinoma, non-small cell   Past Surgical History  Procedure Laterality Date  . Video bronchoscopy Bilateral 10/23/2014    Procedure: VIDEO BRONCHOSCOPY WITHOUT FLUORO;  Surgeon: Tanda Rockers, MD;  Location: [facility];  Service: Cardiopulmonary;  Laterality: Bilateral;   Social History:  reports that he has never smoked. He has never used smokeless tobacco. He reports that he does not drink alcohol or use illicit drugs. he is a Curator.   Allergies  Allergen Reactions  . Hydrocodone-Acetaminophen Nausea Only and Other (See Comments)    Numbness     Family History  Problem Relation Age of Onset  . Cancer Father     Prostate cancer  . Diabetes Mother     Prior to Admission medications   Medication Sig Start Date End Date Taking? Authorizing Provider  erlotinib (TARCEVA) 150 MG tablet Take 1 tablet (150 mg total) by mouth daily. Take on  an empty stomach 1 hour before meals or 2 hours after. 11/12/14  Yes Curt Bears, MD  hyaluronate sodium (RADIAPLEXRX) GEL Apply 1 application topically daily. 12/08/14  Yes Kyung Rudd, MD  ibuprofen (ADVIL,MOTRIN) 200 MG tablet Take 200 mg by mouth every 6 (six) hours as needed for moderate pain.    Yes Historical Provider, MD  HYDROcodone-acetaminophen (NORCO/VICODIN) 5-325 MG tablet Take 1-2 tablets by mouth every 4 (four) hours as needed for moderate pain (or cough). Patient not taking: Reported on 12/10/2014 11/25/14   Kyung Rudd, MD  ondansetron (ZOFRAN) 8 MG tablet Take 1 tablet (8 mg total) by mouth every 8 (eight) hours as needed for nausea or vomiting. Patient not taking: Reported on 12/10/2014 12/02/14   Susanne Borders, NP   Physical Exam: Filed Vitals:   12/10/14 1442 12/10/14 1554  BP: 117/77 117/75  Pulse: 98 105  Temp: 97.8 F (36.6 C)   TempSrc: Oral   Resp: 24 22  SpO2: 98% 99%    Wt Readings from Last 3 Encounters:  12/10/14 74.753 kg (164 lb 12.8 oz)  12/02/14 86.093 kg (189 lb 12.8 oz)  11/25/14 81.647 kg (180 lb)    General:  Appears calm and comfortable Eyes: PERRL, normal lids, irises & conjunctiva, icteric ENT: grossly normal hearing, lips & tongue Neck: no LAD, masses or thyromegaly Cardiovascular: RRR, no m/r/g. Plus 2 LE edema. Respiratory: Bilateral Crackles, Normal respiratory effort. Abdomen: soft, distended, tenderness epigastrium and RUQ Skin: no rash  or induration seen on limited exam Musculoskeletal: grossly normal tone BUE/BLE Psychiatric: grossly normal mood and affect, speech fluent and appropriate Neurologic: grossly non-focal.          Labs on Admission:  Basic Metabolic Panel:  Recent Labs Lab 12/10/14 1017 12/10/14 1508  NA 127* 126*  K 4.8 4.6  CL  --  93*  CO2 23 23  GLUCOSE 289* 231*  BUN 41.3* 42*  CREATININE 1.2 1.11  CALCIUM 8.9 8.7*   Liver Function Tests:  Recent Labs Lab 12/10/14 1017 12/10/14 1508   AST 179 Repeated and Verified* 169*  ALT 540 Repeated and Verified* 505*  ALKPHOS 227* 218*  BILITOT 3.13* 3.3*  PROT 6.5 7.3  ALBUMIN 3.0* 3.4*    Recent Labs Lab 12/10/14 1508  LIPASE 51   No results for input(s): AMMONIA in the last 168 hours. CBC:  Recent Labs Lab 12/10/14 1017 12/10/14 1508  WBC 13.3* 13.9*  NEUTROABS 11.3* 11.6*  HGB 10.8* 11.1*  HCT 33.1* 33.7*  MCV 85.3 84.9  PLT 532* 546*   Cardiac Enzymes: No results for input(s): CKTOTAL, CKMB, CKMBINDEX, TROPONINI in the last 168 hours.  BNP (last 3 results) No results for input(s): BNP in the last 8760 hours.  ProBNP (last 3 results) No results for input(s): PROBNP in the last 8760 hours.  CBG: No results for input(s): GLUCAP in the last 168 hours.  Radiological Exams on Admission: Dg Chest 2 View  12/10/2014  CLINICAL DATA:  Cough and shortness of breath. Current diagnosis of lung cancer. EXAM: CHEST  2 VIEW COMPARISON:  PET-CT dated 11/03/2014 FINDINGS: Cardiomediastinal silhouette is enlarged. Mediastinal contours is obscured by a large left perihilar mass. There are bilateral pleural effusions with associated subsegmental bibasilar atelectasis. Osseous structures are without acute abnormality. Soft tissues are grossly normal. IMPRESSION: Large left perihilar mass, consistent with known primary lung malignancy. Bilateral pleural effusions, moderate in size. Enlarged cardiac silhouette. Pericardial effusion cannot be excluded. Electronically Signed   By: Fidela Salisbury M.D.   On: 12/10/2014 15:21   US Abdomen Limited  12/10/2014  CLINICAL DATA:  Evaluate for cholecystitis/cause of hepatitis EXAM: US ABDOMEN LIMITED - RIGHT UPPER QUADRANT COMPARISON:  None. FINDINGS: Gallbladder: Multiple gallstones within the partially full gallbladder. The wall is circumferentially thickened to 6 mm. No pericholecystic edema. Sonographer reports focal tenderness. Common bile duct: Diameter: 2 mm Liver: Three  echogenic masses within the liver, ranging between 16 and 24 mm in maximal diameter, without hypoechoic halo. Antegrade flow in the imaged portal venous system. Right pleural effusion, small by chest radiography. IMPRESSION: 1. At least 3 liver masses, primarily concerning for lung cancer metastases. 2. Cholelithiasis with conflicting findings for acute cholecystitis. There is gallbladder wall thickening and focal tenderness, but the gallbladder is not dilated as usually seen with cystic duct obstruction and acute cholecystitis. 3. Right pleural effusion. Electronically Signed   By: Monte Fantasia M.D.   On: 12/10/2014 16:12    EKG: will order EKG   Assessment/Plan Active Problems:   Hyperglycemia   Hyperbilirubinemia   Abnormal transaminases   Anasarca   Leukocytosis   Hyponatremia   1-Transaminases, Hyperbilirubinemia, Abdominal Pain:  This could be related to cholelithiasis,cholecystitis vs adverse effect from chemo Tarceva, vs metastasis diseases.  IV Unasyn to cover for infection.  MRCP// HIDA  Scan.  Surgery consulted  Clear diet and NPO after midnight for studies.  Hold tarceva  2-Hyponatremia;  Could be related to hypovolemia, but also patient has elevated BUN,component  of dehydration. . Will check urine sodium, urine cr.  Hold on IV fluids to avoid pulmonary edema.   3-Pleural effusion, Lower extremity edema;  Could be related to liver failure, will also order ECHO to evaluate for heart failure.  He will probably need Lasix if renal function remain stable and no hypotension due to infectious process.   4-Lung Cancer, metastasis ;  Dr Julien Nordmann added to rounding team.  New 3 liver lesions.   5-Hyperglycemia SSI.  Check HBA1c.   Code Status: full code.  DVT Prophylaxis: heparin.  Family Communication: care discussed with patient and wife who was at bedside.  Disposition Plan: expect 3 to 4 days in patient.   Time spent: 75 minutes.   Niel Hummer A [facility] Pager [phone]

## 2014-12-10 NOTE — Telephone Encounter (Signed)
per pof to sch pt appt w/MDr Julien Nordmann no openings-sch pt w/Lisa thomas she had appt closer to pt radiaition appt

## 2014-12-10 NOTE — Progress Notes (Signed)
Patient brought by wheel chair to the ED. Report was given to [facility]. Pt assisted to the bed in room 19. Wife at bedside.

## 2014-12-10 NOTE — ED Notes (Signed)
Ultrasound tech at bedside

## 2014-12-10 NOTE — Consult Note (Addendum)
Re:   Steven Roberts DOB:   [birth year] MRN:   [id number]    ASSESSMENT AND PLAN: 1.  Gall bladder disease - unclear how much of his symptoms are due to the gall bladder.  Abnormal [place] - stones and thickened gall bladder wall  MRI has motion artifact - final report still pending  HIDA scan pending. Final recommendations pending above test and exam of patient. Dr. Marlou Starks is our surgeon of the week and will follow up with him in the AM.  2.  Metastatic lung cancer - adenoca  On Tarceva  Followed by Dr. Julien Nordmann  Getting radiation to chest that started this week - Dr. Lisbeth Renshaw.  3.  Mets to liver by [place] - these were not known before. 4.  Rash of face, chest, back thought to be fromTarceva 5.  Staring episodes - nurses worried "seizures" but CT of head non diagnositc 6.  [place] out of bed last PM and hit head - but CT okay  Chief Complaint  Patient presents with  . Possible biliary obstruction   . Lung Cancer  . Cough  . Abnormal Labs    REFERRING PHYSICIAN: Pcp Not In System  HISTORY OF PRESENT ILLNESS: Steven Roberts is a 49 y.o. (DOB: [birth year])  hispanic  male whose primary care physician is Pcp Not In System and comes to [facility] ER today for abdominal pain and leg swelling. His wife, Steven Roberts, and son are in the room.  The patient has had about a week of ill defined abdominal pain.  He points to the epigastrium and lower abdomen.  The pain does not seem to lateralize well. He has no history of stomach, liver, colon disease.  He has had no prior abdominal surgery.  His appetite has been poor.  He has lost about 25 pounds over the last couple of months.  He is taking Tarceva - and has had a reaction involving a rash of face, chest and back.  He just started chest radiation therapy Monday, 10/24.  His wife thinks that his cough is worse since starting the radiation tx.  An abdominal US - 12/10/2014 - 1. At least 3 liver masses, primarily concerning for lung cancer metastases.  2.  Cholelithiasis with conflicting findings for acute cholecystitis.    There is gallbladder wall thickening and focal tenderness, but the gallbladder is not dilated as usually seen with cystic duct obstruction and acute cholecystitis.  3. Right pleural effusion.  WBC - 13,900 - 12/10/2014 T. Bili - 3.3, Alk Phos - 218, ALT - 505, AST - 169 - 12/10/2014 PT/INR - 17.9/1.46 - 12/10/2014   Past Medical History  Diagnosis Date  . Bone cancer ([place]) 11/03/14 Pet scan    left 6th rib, right hip  . Lung cancer ([place]) 10/23/14    LUL adenocarcinoma, non-small cell      Past Surgical History  Procedure Laterality Date  . Video bronchoscopy Bilateral 10/23/2014    Procedure: VIDEO BRONCHOSCOPY WITHOUT FLUORO;  Surgeon: Tanda Rockers, MD;  Location: [facility];  Service: Cardiopulmonary;  Laterality: Bilateral;      Current Facility-Administered Medications  Medication Dose Route Frequency Provider Last Rate Last Dose  . 0.9 %  sodium chloride infusion  1,000 mL Intravenous Continuous Merrily Pew, MD 125 mL/hr at 12/10/14 1559 1,000 mL at 12/10/14 1559  . ampicillin-sulbactam (UNASYN) 1.5 g in sodium chloride 0.9 % 50 mL IVPB  1.5 g Intravenous Once Merrily Pew, MD 100 mL/hr at 12/10/14 1832 1.5 g at  12/10/14 1832  . Ampicillin-Sulbactam (UNASYN) 3 g in sodium chloride 0.9 % 100 mL IVPB  3 g Intravenous Q6H Thomes Lolling, RPH      . [START ON 12/11/2014] insulin aspart (novoLOG) injection 0-9 Units  0-9 Units Subcutaneous TID WC Belkys A Regalado, MD       Current Outpatient Prescriptions  Medication Sig Dispense Refill  . erlotinib (TARCEVA) 150 MG tablet Take 1 tablet (150 mg total) by mouth daily. Take on an empty stomach 1 hour before meals or 2 hours after. 30 tablet 2  . hyaluronate sodium (RADIAPLEXRX) GEL Apply 1 application topically daily.    Marland Kitchen ibuprofen (ADVIL,MOTRIN) 200 MG tablet Take 200 mg by mouth every 6 (six) hours as needed for moderate pain.     Marland Kitchen  HYDROcodone-acetaminophen (NORCO/VICODIN) 5-325 MG tablet Take 1-2 tablets by mouth every 4 (four) hours as needed for moderate pain (or cough). (Patient not taking: Reported on 12/10/2014) 90 tablet 0  . ondansetron (ZOFRAN) 8 MG tablet Take 1 tablet (8 mg total) by mouth every 8 (eight) hours as needed for nausea or vomiting. (Patient not taking: Reported on 12/10/2014) 20 tablet 2      Allergies  Allergen Reactions  . Hydrocodone-Acetaminophen Nausea Only and Other (See Comments)    Numbness     REVIEW OF SYSTEMS: Skin:  No history of rash.  No history of abnormal moles. Infection:  No history of hepatitis or HIV.  Marland Kitchen Neurologic:  No history of stroke.  No history of seizure.  No history of headaches. Cardiac:  No history of hypertension. No history of heart disease.  No history of prior cardiac catheterization.    Pulmonary:  History of lung cancer.  Dr. Earlie Server is his oncologist.  Dr. Lisbeth Renshaw for rad onc.  Originally seen by Dr. Melvyn Novas 10/13/2014.  Adenoca in LUL on bronchoscopy 10/23/2014.  PET scan 11/03/2014 - 1. The dominant left upper lobe mass and extensive adenopathy involving the mediastinum, hilar and supraclavicular stations are all hypermetabolic consistent with metastatic disease.      2. Dominant sub solid left lower lobe nodule is hypermetabolic, and not clearly seen on recent outside CT, probably inflammatory rather than a second focus of adenocarcinoma.   3. Numerous small pulmonary nodules bilaterally, too small to characterize with PET, although suspicious for metastatic disease.    4. Suspected osseous metastases in the left 6th rib, the right acetabulum and right femoral neck.  Endocrine:  No diabetes. No thyroid disease. Gastrointestinal:  See HPI Urologic:  No history of kidney stones.  No history of bladder infections. Musculoskeletal:  No history of joint or back disease. Hematologic:  No bleeding disorder.  No history of anemia.  Not anticoagulated.  SOCIAL and FAMILY  HISTORY:  Married. His wife, Steven Roberts, and son are in the room. 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 He has one child, a son.  He had another son who died within the last year.  PHYSICAL EXAM: BP 117/75 mmHg  Pulse 105  Temp(Src) 97.8 F (36.6 C) (Oral)  Resp 22  SpO2 99%  General: WN Hispanic male who is alert. HEENT: Normal. Pupils equal. Good dentition.  Rash of face. Neck: Supple. No mass.  No thyroid mass.   Lymph Nodes:  No supraclavicular or cervical nodes.  Lungs: Clear to auscultation and symmetric breath sounds. Heart:  RRR. No murmur or rub.  Abdomen: Soft. No mass.  No hernia. Normal bowel sounds.  No abdominal scars.  He does not have an impressive exam for abdominal pain. Rectal: Not done. Extremities:  Good strength and ROM  in upper and lower extremities. Neurologic:  Grossly intact to motor and sensory function.  DATA REVIEWED: Epic notes.  Alphonsa Overall, MD,  [facility], [address]  [place], [place]    [place] Phone:  [phone] FAX:  [phone]

## 2014-12-10 NOTE — Progress Notes (Signed)
ANTIBIOTIC CONSULT NOTE  Pharmacy Consult for Unasyn Indication: intra-abdominal infection  Allergies  Allergen Reactions  . Hydrocodone-Acetaminophen Nausea Only and Other (See Comments)    Numbness     Patient Measurements:   Wt 74.8kg  Vital Signs: Temp: 97.8 F (36.6 C) (10/27 1442) Temp Source: Oral (10/27 1442) BP: 117/75 mmHg (10/27 1554) Pulse Rate: 105 (10/27 1554) Intake/Output from previous day:   Intake/Output from this shift:    Labs:  Recent Labs  12/10/14 1017 12/10/14 1017 12/10/14 1508  WBC 13.3*  --  13.9*  HGB 10.8*  --  11.1*  PLT 532*  --  546*  CREATININE  --  1.2 1.11   Estimated Creatinine Clearance: 79.6 mL/min (by C-G formula based on Cr of 1.11). No results for input(s): VANCOTROUGH, VANCOPEAK, VANCORANDOM, GENTTROUGH, GENTPEAK, GENTRANDOM, TOBRATROUGH, TOBRAPEAK, TOBRARND, AMIKACINPEAK, AMIKACINTROU, AMIKACIN in the last 72 hours.   Microbiology: No results found for this or any previous visit (from the past 720 hour(s)).  Anti-infectives    Start     Dose/Rate Route Frequency Ordered Stop   12/10/14 1715  ampicillin-sulbactam (UNASYN) 1.5 g in sodium chloride 0.9 % 50 mL IVPB     1.5 g 100 mL/hr over 30 Minutes Intravenous  Once 12/10/14 1713        Assessment: 49 yo M with lung CA who was started on Tarceva ~ 2 weeks ago and was suppose to start radiation therapy today.  He was sent to ED from cancer center with elevated LFTs & WBC.  Lactic acid also elevated.  Concern for biliary obstruction.  Pharmacy asked to initiate empiric antibiotic therapy with Unasyn.  No cx data collected.  Scr slightly elevated from baseline, but estimated CrCl ~ 7m/min.  Unasyn 1.'5mg'$  IV x1 already ordered in ED.   Unasyn 10/27>>  Goal of Therapy:  Eradicate infection.  Plan:  Unasyn 3gm IV q6h Monitor renal function and cx data   LBiagio Borg10/27/2016,6:07 PM

## 2014-12-11 ENCOUNTER — Inpatient Hospital Stay (HOSPITAL_COMMUNITY): Payer: Medicaid Other

## 2014-12-11 ENCOUNTER — Ambulatory Visit: Payer: Medicaid Other

## 2014-12-11 ENCOUNTER — Other Ambulatory Visit (HOSPITAL_COMMUNITY): Payer: Self-pay

## 2014-12-11 ENCOUNTER — Ambulatory Visit: Payer: Medicaid Other | Admitting: Radiation Oncology

## 2014-12-11 ENCOUNTER — Inpatient Hospital Stay (HOSPITAL_COMMUNITY): Payer: No Typology Code available for payment source

## 2014-12-11 ENCOUNTER — Encounter (HOSPITAL_COMMUNITY)
Admission: EM | Disposition: A | Discharge status: Home / Self Care | Payer: Self-pay | Source: Home / Self Care | Attending: Internal Medicine

## 2014-12-11 ENCOUNTER — Inpatient Hospital Stay (HOSPITAL_COMMUNITY): Payer: Medicaid Other | Admitting: Anesthesiology

## 2014-12-11 ENCOUNTER — Encounter (HOSPITAL_COMMUNITY): Payer: Self-pay | Admitting: Thoracic Surgery (Cardiothoracic Vascular Surgery)

## 2014-12-11 DIAGNOSIS — I319 Disease of pericardium, unspecified: Secondary | ICD-10-CM

## 2014-12-11 DIAGNOSIS — I3139 Other pericardial effusion (noninflammatory): Secondary | ICD-10-CM

## 2014-12-11 DIAGNOSIS — I313 Pericardial effusion (noninflammatory): Secondary | ICD-10-CM | POA: Diagnosis present

## 2014-12-11 DIAGNOSIS — R4182 Altered mental status, unspecified: Secondary | ICD-10-CM

## 2014-12-11 DIAGNOSIS — D72829 Elevated white blood cell count, unspecified: Secondary | ICD-10-CM

## 2014-12-11 DIAGNOSIS — C787 Secondary malignant neoplasm of liver and intrahepatic bile duct: Secondary | ICD-10-CM

## 2014-12-11 DIAGNOSIS — E871 Hypo-osmolality and hyponatremia: Secondary | ICD-10-CM

## 2014-12-11 DIAGNOSIS — R600 Localized edema: Secondary | ICD-10-CM

## 2014-12-11 DIAGNOSIS — D696 Thrombocytopenia, unspecified: Secondary | ICD-10-CM

## 2014-12-11 DIAGNOSIS — R0600 Dyspnea, unspecified: Secondary | ICD-10-CM

## 2014-12-11 DIAGNOSIS — Z510 Encounter for antineoplastic radiation therapy: Secondary | ICD-10-CM | POA: Diagnosis not present

## 2014-12-11 DIAGNOSIS — I314 Cardiac tamponade: Secondary | ICD-10-CM

## 2014-12-11 HISTORY — PX: TEE WITHOUT CARDIOVERSION: SHX5443

## 2014-12-11 HISTORY — PX: SUBXYPHOID PERICARDIAL WINDOW: SHX5075

## 2014-12-11 HISTORY — DX: Pericardial effusion (noninflammatory): I31.3

## 2014-12-11 HISTORY — DX: Cardiac tamponade: I31.4

## 2014-12-11 HISTORY — DX: Other pericardial effusion (noninflammatory): I31.39

## 2014-12-11 LAB — CBC
HEMATOCRIT: 32.3 % — AB (ref 39.0–52.0)
HEMOGLOBIN: 10.5 g/dL — AB (ref 13.0–17.0)
MCH: 27.9 pg (ref 26.0–34.0)
MCHC: 32.5 g/dL (ref 30.0–36.0)
MCV: 85.9 fL (ref 78.0–100.0)
Platelets: 516 10*3/uL — ABNORMAL HIGH (ref 150–400)
RBC: 3.76 MIL/uL — ABNORMAL LOW (ref 4.22–5.81)
RDW: 16.2 % — AB (ref 11.5–15.5)
WBC: 12.7 10*3/uL — AB (ref 4.0–10.5)

## 2014-12-11 LAB — GLUCOSE, CAPILLARY
GLUCOSE-CAPILLARY: 203 mg/dL — AB (ref 65–99)
GLUCOSE-CAPILLARY: 211 mg/dL — AB (ref 65–99)
GLUCOSE-CAPILLARY: 238 mg/dL — AB (ref 65–99)

## 2014-12-11 LAB — BODY FLUID CELL COUNT WITH DIFFERENTIAL
EOS FL: 1 %
LYMPHS FL: 34 %
Monocyte-Macrophage-Serous Fluid: 13 % — ABNORMAL LOW (ref 50–90)
NEUTROPHIL FLUID: 52 % — AB (ref 0–25)
Total Nucleated Cell Count, Fluid: 7650 cu mm — ABNORMAL HIGH (ref 0–1000)

## 2014-12-11 LAB — BLOOD PRODUCT ORDER (VERBAL) VERIFICATION

## 2014-12-11 LAB — COMPREHENSIVE METABOLIC PANEL
ALBUMIN: 3 g/dL — AB (ref 3.5–5.0)
ALT: 422 U/L — ABNORMAL HIGH (ref 17–63)
ANION GAP: 11 (ref 5–15)
AST: 117 U/L — AB (ref 15–41)
Alkaline Phosphatase: 192 U/L — ABNORMAL HIGH (ref 38–126)
BILIRUBIN TOTAL: 3.2 mg/dL — AB (ref 0.3–1.2)
BUN: 37 mg/dL — AB (ref 6–20)
CHLORIDE: 97 mmol/L — AB (ref 101–111)
CO2: 21 mmol/L — ABNORMAL LOW (ref 22–32)
Calcium: 8.7 mg/dL — ABNORMAL LOW (ref 8.9–10.3)
Creatinine, Ser: 1.04 mg/dL (ref 0.61–1.24)
GFR calc Af Amer: >60 mL/min (ref >60)
Glucose, Bld: 222 mg/dL — ABNORMAL HIGH (ref 65–99)
POTASSIUM: 4.3 mmol/L (ref 3.5–5.1)
Sodium: 129 mmol/L — ABNORMAL LOW (ref 135–145)
TOTAL PROTEIN: 6.6 g/dL (ref 6.5–8.1)

## 2014-12-11 LAB — MRSA PCR SCREENING: MRSA BY PCR: NEGATIVE

## 2014-12-11 LAB — TYPE AND SCREEN
ABO/RH(D): O POS
ANTIBODY SCREEN: NEGATIVE

## 2014-12-11 LAB — AMMONIA: Ammonia: 36 umol/L — ABNORMAL HIGH (ref 9–35)

## 2014-12-11 LAB — GLUCOSE, SEROUS FLUID: GLUCOSE FL: 131 mg/dL

## 2014-12-11 LAB — HEMOGLOBIN A1C
Hgb A1c MFr Bld: 8 % — ABNORMAL HIGH (ref 4.8–5.6)
Mean Plasma Glucose: 183 mg/dL

## 2014-12-11 LAB — GRAM STAIN

## 2014-12-11 LAB — LACTATE DEHYDROGENASE, PLEURAL OR PERITONEAL FLUID: LD, Fluid: 1013 U/L — ABNORMAL HIGH (ref 3–23)

## 2014-12-11 LAB — ABO/RH: ABO/RH(D): O POS

## 2014-12-11 SURGERY — CREATION, PERICARDIAL WINDOW, SUBXIPHOID APPROACH
Anesthesia: General | Site: Chest

## 2014-12-11 MED ORDER — ACETAMINOPHEN 500 MG PO TABS
1000.0000 mg | ORAL_TABLET | Freq: Four times a day (QID) | ORAL | Status: AC
  Administered 2014-12-12 – 2014-12-16 (×16): 1000 mg via ORAL
  Filled 2014-12-11 (×21): qty 2

## 2014-12-11 MED ORDER — SODIUM CHLORIDE 0.9 % IJ SOLN
INTRAMUSCULAR | Status: AC
  Filled 2014-12-11: qty 10

## 2014-12-11 MED ORDER — HYDROMORPHONE HCL 1 MG/ML IJ SOLN
0.2500 mg | INTRAMUSCULAR | Status: DC | PRN
  Administered 2014-12-11 (×4): 0.5 mg via INTRAVENOUS

## 2014-12-11 MED ORDER — SUCCINYLCHOLINE CHLORIDE 20 MG/ML IJ SOLN
INTRAMUSCULAR | Status: DC | PRN
  Administered 2014-12-11: 120 mg via INTRAVENOUS

## 2014-12-11 MED ORDER — DEXTROSE 5 % IV SOLN
1.5000 g | Freq: Two times a day (BID) | INTRAVENOUS | Status: AC
  Administered 2014-12-11 – 2014-12-12 (×2): 1.5 g via INTRAVENOUS
  Filled 2014-12-11 (×2): qty 1.5

## 2014-12-11 MED ORDER — ROCURONIUM BROMIDE 50 MG/5ML IV SOLN
INTRAVENOUS | Status: AC
  Filled 2014-12-11: qty 1

## 2014-12-11 MED ORDER — TECHNETIUM TC 99M MEBROFENIN IV KIT
7.4000 | PACK | Freq: Once | INTRAVENOUS | Status: DC | PRN
  Administered 2014-12-11: 7 via INTRAVENOUS
  Filled 2014-12-11: qty 8

## 2014-12-11 MED ORDER — MEPERIDINE HCL 25 MG/ML IJ SOLN: 6.2500 mg | INTRAMUSCULAR | Status: DC | PRN

## 2014-12-11 MED ORDER — PROPOFOL 10 MG/ML IV BOLUS
INTRAVENOUS | Status: DC | PRN
  Administered 2014-12-11: 20 mg via INTRAVENOUS
  Administered 2014-12-11: 120 mg via INTRAVENOUS

## 2014-12-11 MED ORDER — ESMOLOL HCL 100 MG/10ML IV SOLN
INTRAVENOUS | Status: DC | PRN
  Administered 2014-12-11: 20 mg via INTRAVENOUS

## 2014-12-11 MED ORDER — MIDAZOLAM HCL 2 MG/2ML IJ SOLN
INTRAMUSCULAR | Status: AC
Start: 2014-12-11 — End: 2014-12-11
  Filled 2014-12-11: qty 2

## 2014-12-11 MED ORDER — SODIUM CHLORIDE 0.9 % IJ SOLN
INTRAMUSCULAR | Status: DC | PRN
  Administered 2014-12-11: 4 mL via TOPICAL

## 2014-12-11 MED ORDER — EPHEDRINE SULFATE 50 MG/ML IJ SOLN
INTRAMUSCULAR | Status: DC | PRN
  Administered 2014-12-11: 5 mg via INTRAVENOUS

## 2014-12-11 MED ORDER — EPHEDRINE SULFATE 50 MG/ML IJ SOLN
INTRAMUSCULAR | Status: AC
  Filled 2014-12-11: qty 1

## 2014-12-11 MED ORDER — POTASSIUM CHLORIDE 10 MEQ/50ML IV SOLN
10.0000 meq | Freq: Every day | INTRAVENOUS | Status: DC | PRN
Start: 2014-12-11 — End: 2014-12-18
  Filled 2014-12-11: qty 50

## 2014-12-11 MED ORDER — NEOSTIGMINE METHYLSULFATE 10 MG/10ML IV SOLN
INTRAVENOUS | Status: DC | PRN
  Administered 2014-12-11: 4 mg via INTRAVENOUS

## 2014-12-11 MED ORDER — ENSURE ENLIVE PO LIQD: 237.0000 mL | Freq: Two times a day (BID) | ORAL | Status: DC

## 2014-12-11 MED ORDER — ACETAMINOPHEN 160 MG/5ML PO SOLN
1000.0000 mg | Freq: Four times a day (QID) | ORAL | Status: AC
Start: 2014-12-12 — End: 2014-12-16
  Administered 2014-12-11: 1000 mg via ORAL
  Filled 2014-12-11 (×20): qty 40

## 2014-12-11 MED ORDER — SUCCINYLCHOLINE CHLORIDE 20 MG/ML IJ SOLN
INTRAMUSCULAR | Status: AC
  Filled 2014-12-11: qty 1

## 2014-12-11 MED ORDER — PROPOFOL 10 MG/ML IV BOLUS
INTRAVENOUS | Status: AC
  Filled 2014-12-11: qty 20

## 2014-12-11 MED ORDER — GLYCOPYRROLATE 0.2 MG/ML IJ SOLN
INTRAMUSCULAR | Status: AC
  Filled 2014-12-11: qty 3

## 2014-12-11 MED ORDER — NEOSTIGMINE METHYLSULFATE 10 MG/10ML IV SOLN
INTRAVENOUS | Status: AC
  Filled 2014-12-11: qty 1

## 2014-12-11 MED ORDER — ONDANSETRON HCL 4 MG/2ML IJ SOLN
INTRAMUSCULAR | Status: AC
  Filled 2014-12-11: qty 2

## 2014-12-11 MED ORDER — HYDROMORPHONE HCL 1 MG/ML IJ SOLN
INTRAMUSCULAR | Status: AC
  Administered 2014-12-11: 0.5 mg via INTRAVENOUS
  Filled 2014-12-11: qty 2

## 2014-12-11 MED ORDER — DEXAMETHASONE SODIUM PHOSPHATE 10 MG/ML IJ SOLN
INTRAMUSCULAR | Status: DC | PRN
  Administered 2014-12-11: 10 mg via INTRAVENOUS

## 2014-12-11 MED ORDER — 0.9 % SODIUM CHLORIDE (POUR BTL) OPTIME
TOPICAL | Status: DC | PRN
  Administered 2014-12-11: 2000 mL

## 2014-12-11 MED ORDER — ARTIFICIAL TEARS OP OINT
TOPICAL_OINTMENT | OPHTHALMIC | Status: AC
  Filled 2014-12-11: qty 3.5

## 2014-12-11 MED ORDER — PROMETHAZINE HCL 25 MG/ML IJ SOLN: 6.2500 mg | INTRAMUSCULAR | Status: DC | PRN

## 2014-12-11 MED ORDER — FENTANYL CITRATE (PF) 100 MCG/2ML IJ SOLN
INTRAMUSCULAR | Status: DC | PRN
  Administered 2014-12-11 (×2): 50 ug via INTRAVENOUS
  Administered 2014-12-11: 150 ug via INTRAVENOUS

## 2014-12-11 MED ORDER — FENTANYL CITRATE (PF) 250 MCG/5ML IJ SOLN
INTRAMUSCULAR | Status: AC
  Filled 2014-12-11: qty 5

## 2014-12-11 MED ORDER — PHENYLEPHRINE 40 MCG/ML (10ML) SYRINGE FOR IV PUSH (FOR BLOOD PRESSURE SUPPORT)
PREFILLED_SYRINGE | INTRAVENOUS | Status: AC
  Filled 2014-12-11: qty 10

## 2014-12-11 MED ORDER — LACTATED RINGERS IV SOLN
INTRAVENOUS | Status: DC
  Administered 2014-12-11 (×2): via INTRAVENOUS

## 2014-12-11 MED ORDER — GLYCOPYRROLATE 0.2 MG/ML IJ SOLN
INTRAMUSCULAR | Status: DC | PRN
  Administered 2014-12-11: 0.6 mg via INTRAVENOUS

## 2014-12-11 MED ORDER — PHENYLEPHRINE HCL 10 MG/ML IJ SOLN
INTRAMUSCULAR | Status: DC | PRN
  Administered 2014-12-11 (×2): 40 ug via INTRAVENOUS

## 2014-12-11 MED ORDER — POTASSIUM CHLORIDE IN NACL 20-0.9 MEQ/L-% IV SOLN
INTRAVENOUS | Status: DC
  Administered 2014-12-11: 23:00:00 via INTRAVENOUS
  Filled 2014-12-11 (×5): qty 1000

## 2014-12-11 MED ORDER — ROCURONIUM BROMIDE 100 MG/10ML IV SOLN
INTRAVENOUS | Status: DC | PRN
  Administered 2014-12-11: 30 mg via INTRAVENOUS

## 2014-12-11 MED ORDER — PHENYLEPHRINE HCL 10 MG/ML IJ SOLN
10.0000 mg | INTRAMUSCULAR | Status: DC | PRN
  Administered 2014-12-11: 25 ug/min via INTRAVENOUS

## 2014-12-11 MED ORDER — LIDOCAINE HCL (CARDIAC) 20 MG/ML IV SOLN
INTRAVENOUS | Status: AC
  Filled 2014-12-11: qty 5

## 2014-12-11 MED ORDER — MIDAZOLAM HCL 5 MG/5ML IJ SOLN
INTRAMUSCULAR | Status: DC | PRN
  Administered 2014-12-11: 2 mg via INTRAVENOUS

## 2014-12-11 SURGICAL SUPPLY — 49 items
ATTRACTOMAT 16X20 MAGNETIC DRP (DRAPES) IMPLANT
BENZOIN TINCTURE PRP APPL 2/3 (GAUZE/BANDAGES/DRESSINGS) ×3 IMPLANT
CANISTER SUCTION 2500CC (MISCELLANEOUS) ×3 IMPLANT
CATH THORACIC 28FR (CATHETERS) IMPLANT
CATH THORACIC 28FR RT ANG (CATHETERS) IMPLANT
CATH THORACIC 36FR (CATHETERS) IMPLANT
CATH THORACIC 36FR RT ANG (CATHETERS) IMPLANT
CLIP TI MEDIUM 24 (CLIP) ×3 IMPLANT
CLIP TI WIDE RED SMALL 24 (CLIP) ×3 IMPLANT
CONN ST 1/4X3/8  BEN (MISCELLANEOUS) ×2
CONN ST 1/4X3/8 BEN (MISCELLANEOUS) ×4 IMPLANT
CONT SPEC 4OZ CLIKSEAL STRL BL (MISCELLANEOUS) IMPLANT
COVER SURGICAL LIGHT HANDLE (MISCELLANEOUS) ×3 IMPLANT
DRAIN CHANNEL 28F RND 3/8 FF (WOUND CARE) ×6 IMPLANT
DRAPE LAPAROSCOPIC ABDOMINAL (DRAPES) ×3 IMPLANT
ELECT BLADE 4.0 EZ CLEAN MEGAD (MISCELLANEOUS) ×3
ELECT REM PT RETURN 9FT ADLT (ELECTROSURGICAL) ×3
ELECTRODE BLDE 4.0 EZ CLN MEGD (MISCELLANEOUS) ×2 IMPLANT
ELECTRODE REM PT RTRN 9FT ADLT (ELECTROSURGICAL) ×2 IMPLANT
GAUZE SPONGE 4X4 12PLY STRL (GAUZE/BANDAGES/DRESSINGS) ×3 IMPLANT
GLOVE ORTHO TXT STRL SZ7.5 (GLOVE) ×6 IMPLANT
GOWN STRL REUS W/ TWL XL LVL3 (GOWN DISPOSABLE) ×4 IMPLANT
GOWN STRL REUS W/TWL XL LVL3 (GOWN DISPOSABLE) ×2
HEMOSTAT POWDER SURGIFOAM 1G (HEMOSTASIS) ×3 IMPLANT
KIT BASIN OR (CUSTOM PROCEDURE TRAY) ×3 IMPLANT
KIT ROOM TURNOVER OR (KITS) ×3 IMPLANT
NS IRRIG 1000ML POUR BTL (IV SOLUTION) ×3 IMPLANT
PACK CHEST (CUSTOM PROCEDURE TRAY) ×3 IMPLANT
PAD ARMBOARD 7.5X6 YLW CONV (MISCELLANEOUS) ×6 IMPLANT
PAD ELECT DEFIB RADIOL ZOLL (MISCELLANEOUS) ×3 IMPLANT
STRIP CLOSURE SKIN 1/2X4 (GAUZE/BANDAGES/DRESSINGS) ×3 IMPLANT
SUT MNCRL AB 3-0 PS2 18 (SUTURE) ×6 IMPLANT
SUT PDS AB 1 CTX 36 (SUTURE) ×3 IMPLANT
SUT SILK  1 MH (SUTURE) ×1
SUT SILK 1 MH (SUTURE) ×2 IMPLANT
SUT VIC AB 1 CTX 18 (SUTURE) ×3 IMPLANT
SUT VIC AB 2-0 CT1 36 (SUTURE) ×3 IMPLANT
SUT VIC AB 3-0 SH 8-18 (SUTURE) ×6 IMPLANT
SWAB COLLECTION DEVICE MRSA (MISCELLANEOUS) IMPLANT
SYR 50ML SLIP (SYRINGE) IMPLANT
SYR 5ML LL (SYRINGE) ×3 IMPLANT
SYSTEM SAHARA CHEST DRAIN ATS (WOUND CARE) IMPLANT
TAPE CLOTH SURG 4X10 WHT LF (GAUZE/BANDAGES/DRESSINGS) ×3 IMPLANT
TOWEL OR 17X24 6PK STRL BLUE (TOWEL DISPOSABLE) ×3 IMPLANT
TOWEL OR 17X26 10 PK STRL BLUE (TOWEL DISPOSABLE) ×3 IMPLANT
TRAP SPECIMEN MUCOUS 40CC (MISCELLANEOUS) ×9 IMPLANT
TRAY FOLEY CATH 14FRSI W/METER (CATHETERS) ×3 IMPLANT
TUBE ANAEROBIC SPECIMEN COL (MISCELLANEOUS) IMPLANT
WATER STERILE IRR 1000ML POUR (IV SOLUTION) ×6 IMPLANT

## 2014-12-11 NOTE — Anesthesia Preprocedure Evaluation (Addendum)
Anesthesia Evaluation  Patient identified by MRN, date of birth, ID band Patient awake    Reviewed: Allergy & Precautions, NPO status , Patient's Chart, lab work & pertinent test results  Airway Mallampati: II  TM Distance: >3 FB Neck ROM: Full    Dental no notable dental hx.    Pulmonary neg pulmonary ROS,    Pulmonary exam normal breath sounds clear to auscultation       Cardiovascular negative cardio ROS Normal cardiovascular exam Rhythm:Regular Rate:Normal     Neuro/Psych negative neurological ROS  negative psych ROS   GI/Hepatic negative GI ROS, Neg liver ROS,   Endo/Other  negative endocrine ROS  Renal/GU negative Renal ROS     Musculoskeletal negative musculoskeletal ROS (+)   Abdominal   Peds  Hematology negative hematology ROS (+)   Anesthesia Other Findings   Reproductive/Obstetrics negative OB ROS                            Anesthesia Physical Anesthesia Plan  ASA: III  Anesthesia Plan: General   Post-op Pain Management:    Induction: Intravenous  Airway Management Planned: Oral ETT  Additional Equipment: TEE and Arterial line  Intra-op Plan:   Post-operative Plan: Extubation in OR  Informed Consent: I have reviewed the patients History and Physical, chart, labs and discussed the procedure including the risks, benefits and alternatives for the proposed anesthesia with the patient or authorized representative who has indicated his/her understanding and acceptance.   Dental advisory given  Plan Discussed with: CRNA  Anesthesia Plan Comments: (2 x PIV, +/- CVL)        Anesthesia Quick Evaluation

## 2014-12-11 NOTE — Progress Notes (Signed)
Primary Physician: Primary Cardiologist:  new   HPI: 49 yo with no known cardiac history Does have a history of adenocarcinoma of the lung, metastatic  Diagnosed in September 2016.  Receiveing XRT and tarceva.  Seen in clinic ysterday.  Abnormal labs.  Sent to ER  Complaints of Abdominal/RUQ discomfort.  Poor po intake  \ This AM had spell of blank look on face.  Unresponsive  Coughed.  Didn't remember anthing else.  Dusky  Rapid respons called but pt quickly responsive.  Wife reports that he has had several spellls since chemo started  Tx to step down.       Echo this AM shows huge pericardial effusion with echo evid of tamponade.      Past Medical History  Diagnosis Date  . Bone cancer ([place]) 11/03/14 Pet scan    left 6th rib, right hip  . Lung cancer ([place]) 10/23/14    LUL adenocarcinoma, non-small cell    Medications Prior to Admission  Medication Sig Dispense Refill  . erlotinib (TARCEVA) 150 MG tablet Take 1 tablet (150 mg total) by mouth daily. Take on an empty stomach 1 hour before meals or 2 hours after. 30 tablet 2  . hyaluronate sodium (RADIAPLEXRX) GEL Apply 1 application topically daily.    Marland Kitchen ibuprofen (ADVIL,MOTRIN) 200 MG tablet Take 200 mg by mouth every 6 (six) hours as needed for moderate pain.     Marland Kitchen HYDROcodone-acetaminophen (NORCO/VICODIN) 5-325 MG tablet Take 1-2 tablets by mouth every 4 (four) hours as needed for moderate pain (or cough). (Patient not taking: Reported on 12/10/2014) 90 tablet 0  . ondansetron (ZOFRAN) 8 MG tablet Take 1 tablet (8 mg total) by mouth every 8 (eight) hours as needed for nausea or vomiting. (Patient not taking: Reported on 12/10/2014) 20 tablet 2     . ampicillin-sulbactam (UNASYN) IV  3 g Intravenous Q6H  . docusate sodium  100 mg Oral BID  . feeding supplement  1 Container Oral TID BM  . feeding supplement (ENSURE ENLIVE)  237 mL Oral BID BM  . heparin  5,000 Units Subcutaneous 3 times per day  . insulin aspart  0-9 Units  Subcutaneous TID WC  . sodium chloride  3 mL Intravenous Q12H    Infusions:    Allergies  Allergen Reactions  . Hydrocodone-Acetaminophen Nausea Only and Other (See Comments)    Numbness     Social History   Social History  . Marital Status: Married    Spouse Name: N/A  . Number of Children: 1  . Years of Education: N/A   Occupational History  . [employer]    Social History Main Topics  . Smoking status: Never Smoker   . Smokeless tobacco: Never Used  . Alcohol Use: No  . Drug Use: No  . Sexual Activity: Not on file   Other Topics Concern  . Not on file   Social History Narrative    Family History  Problem Relation Age of Onset  . Cancer Father     Prostate cancer  . Diabetes Mother     REVIEW OF SYSTEMS:  All systems reviewed  Negative to the above problem except as noted above.    PHYSICAL EXAM: Filed Vitals:   12/11/14 0900  BP: 120/69  Pulse: 95  Temp: 97.8 F (36.6 C)  Resp: 22     Intake/Output Summary (Last 24 hours) at 12/11/14 1026 Last data filed at 12/11/14 0700  Gross per 24 hour  Intake  440 ml  Output      0 ml  Net    440 ml    General:  Thin 49 yo in NAD   Sl jaundiced   HEENT: normal Neck: supple.JVP is increased Cor: PMI nondisplaced. Regular rate & rhythm. No rubs, gallops or murmurs. Lungs  Clear anterior   Abdomen  Deferred   :Extremities:  1+ edema Neuro:  Alert and oriented x 3  Moving extremities    ECG:SR 100 bpm    Results for orders placed or performed during the hospital encounter of 12/10/14 (from the past 24 hour(s))  CBC WITH DIFFERENTIAL     Status: Abnormal   Collection Time: 12/10/14  3:08 PM  Result Value Ref Range   WBC 13.9 (H) 4.0 - 10.5 K/uL   RBC 3.97 (L) 4.22 - 5.81 MIL/uL   Hemoglobin 11.1 (L) 13.0 - 17.0 g/dL   HCT 33.7 (L) 39.0 - 52.0 %   MCV 84.9 78.0 - 100.0 fL   MCH 28.0 26.0 - 34.0 pg   MCHC 32.9 30.0 - 36.0 g/dL   RDW 15.8 (H) 11.5 - 15.5 %   Platelets 546 (H) 150 - 400 K/uL    Neutrophils Relative % 84 %   Neutro Abs 11.6 (H) 1.7 - 7.7 K/uL   Lymphocytes Relative 7 %   Lymphs Abs 1.0 0.7 - 4.0 K/uL   Monocytes Relative 9 %   Monocytes Absolute 1.3 (H) 0.1 - 1.0 K/uL   Eosinophils Relative 0 %   Eosinophils Absolute 0.0 0.0 - 0.7 K/uL   Basophils Relative 0 %   Basophils Absolute 0.0 0.0 - 0.1 K/uL  Comprehensive metabolic panel     Status: Abnormal   Collection Time: 12/10/14  3:08 PM  Result Value Ref Range   Sodium 126 (L) 135 - 145 mmol/L   Potassium 4.6 3.5 - 5.1 mmol/L   Chloride 93 (L) 101 - 111 mmol/L   CO2 23 22 - 32 mmol/L   Glucose, Bld 231 (H) 65 - 99 mg/dL   BUN 42 (H) 6 - 20 mg/dL   Creatinine, Ser 1.11 0.61 - 1.24 mg/dL   Calcium 8.7 (L) 8.9 - 10.3 mg/dL   Total Protein 7.3 6.5 - 8.1 g/dL   Albumin 3.4 (L) 3.5 - 5.0 g/dL   AST 169 (H) 15 - 41 U/L   ALT 505 (H) 17 - 63 U/L   Alkaline Phosphatase 218 (H) 38 - 126 U/L   Total Bilirubin 3.3 (H) 0.3 - 1.2 mg/dL   GFR calc non Af Amer >60 >60 mL/min   GFR calc Af Amer >60 >60 mL/min   Anion gap 10 5 - 15  Lactic acid, plasma     Status: Abnormal   Collection Time: 12/10/14  3:08 PM  Result Value Ref Range   Lactic Acid, Venous 2.8 (HH) 0.5 - 2.0 mmol/L  Lipase, blood     Status: None   Collection Time: 12/10/14  3:08 PM  Result Value Ref Range   Lipase 51 11 - 51 U/L  Protime-INR     Status: Abnormal   Collection Time: 12/10/14  3:08 PM  Result Value Ref Range   Prothrombin Time 17.9 (H) 11.6 - 15.2 seconds   INR 1.46 0.00 - 1.49  Hemoglobin A1c     Status: Abnormal   Collection Time: 12/10/14  3:08 PM  Result Value Ref Range   Hgb A1c MFr Bld 8.0 (H) 4.8 - 5.6 %   Mean Plasma  Glucose 183 mg/dL  Urinalysis, Routine w reflex microscopic (not at [facility])     Status: Abnormal   Collection Time: 12/10/14  3:53 PM  Result Value Ref Range   Color, Urine AMBER (A) YELLOW   APPearance CLOUDY (A) CLEAR   Specific Gravity, Urine 1.022 1.005 - 1.030   pH 6.0 5.0 - 8.0   Glucose, UA NEGATIVE  NEGATIVE mg/dL   Hgb urine dipstick TRACE (A) NEGATIVE   Bilirubin Urine NEGATIVE NEGATIVE   Ketones, ur NEGATIVE NEGATIVE mg/dL   Protein, ur 100 (A) NEGATIVE mg/dL   Urobilinogen, UA 0.2 0.0 - 1.0 mg/dL   Nitrite NEGATIVE NEGATIVE   Leukocytes, UA NEGATIVE NEGATIVE  Urine microscopic-add on     Status: Abnormal   Collection Time: 12/10/14  3:53 PM  Result Value Ref Range   Squamous Epithelial / LPF RARE RARE   WBC, UA 3-6 <3 WBC/hpf   RBC / HPF 0-2 <3 RBC/hpf   Casts HYALINE CASTS (A) NEGATIVE   Urine-Other MUCOUS PRESENT   Sodium, urine, random     Status: None   Collection Time: 12/10/14  5:49 PM  Result Value Ref Range   Sodium, Ur <10 mmol/L  Creatinine, urine, random     Status: None   Collection Time: 12/10/14  5:49 PM  Result Value Ref Range   Creatinine, Urine 167.68 mg/dL  Acetaminophen level     Status: Abnormal   Collection Time: 12/10/14  6:35 PM  Result Value Ref Range   Acetaminophen (Tylenol), Serum <5 (L) 10 - 30 ug/mL  Glucose, capillary     Status: Abnormal   Collection Time: 12/10/14 10:06 PM  Result Value Ref Range   Glucose-Capillary 203 (H) 65 - 99 mg/dL  Comprehensive metabolic panel     Status: Abnormal   Collection Time: 12/11/14  5:37 AM  Result Value Ref Range   Sodium 129 (L) 135 - 145 mmol/L   Potassium 4.3 3.5 - 5.1 mmol/L   Chloride 97 (L) 101 - 111 mmol/L   CO2 21 (L) 22 - 32 mmol/L   Glucose, Bld 222 (H) 65 - 99 mg/dL   BUN 37 (H) 6 - 20 mg/dL   Creatinine, Ser 1.04 0.61 - 1.24 mg/dL   Calcium 8.7 (L) 8.9 - 10.3 mg/dL   Total Protein 6.6 6.5 - 8.1 g/dL   Albumin 3.0 (L) 3.5 - 5.0 g/dL   AST 117 (H) 15 - 41 U/L   ALT 422 (H) 17 - 63 U/L   Alkaline Phosphatase 192 (H) 38 - 126 U/L   Total Bilirubin 3.2 (H) 0.3 - 1.2 mg/dL   GFR calc non Af Amer >60 >60 mL/min   GFR calc Af Amer >60 >60 mL/min   Anion gap 11 5 - 15  CBC     Status: Abnormal   Collection Time: 12/11/14  5:37 AM  Result Value Ref Range   WBC 12.7 (H) 4.0 - 10.5 K/uL    RBC 3.76 (L) 4.22 - 5.81 MIL/uL   Hemoglobin 10.5 (L) 13.0 - 17.0 g/dL   HCT 32.3 (L) 39.0 - 52.0 %   MCV 85.9 78.0 - 100.0 fL   MCH 27.9 26.0 - 34.0 pg   MCHC 32.5 30.0 - 36.0 g/dL   RDW 16.2 (H) 11.5 - 15.5 %   Platelets 516 (H) 150 - 400 K/uL  Ammonia     Status: Abnormal   Collection Time: 12/11/14  7:50 AM  Result Value Ref Range   Ammonia 36 (  H) 9 - 35 umol/L  Glucose, capillary     Status: Abnormal   Collection Time: 12/11/14  7:54 AM  Result Value Ref Range   Glucose-Capillary 211 (H) 65 - 99 mg/dL   Dg Chest 2 View  12/10/2014  CLINICAL DATA:  Cough and shortness of breath. Current diagnosis of lung cancer. EXAM: CHEST  2 VIEW COMPARISON:  PET-CT dated 11/03/2014 FINDINGS: Cardiomediastinal silhouette is enlarged. Mediastinal contours is obscured by a large left perihilar mass. There are bilateral pleural effusions with associated subsegmental bibasilar atelectasis. Osseous structures are without acute abnormality. Soft tissues are grossly normal. IMPRESSION: Large left perihilar mass, consistent with known primary lung malignancy. Bilateral pleural effusions, moderate in size. Enlarged cardiac silhouette. Pericardial effusion cannot be excluded. Electronically Signed   By: Fidela Salisbury M.D.   On: 12/10/2014 15:21   Ct Head Wo Contrast  12/11/2014  CLINICAL DATA:  Rolled out of bed and hit right upper posterior head. Initial encounter. EXAM: CT HEAD WITHOUT CONTRAST TECHNIQUE: Contiguous axial images were obtained from the base of the skull through the vertex without intravenous contrast. COMPARISON:  MRI of the brain performed 11/19/2014 FINDINGS: There is no evidence of acute infarction, mass lesion, or intra- or extra-axial hemorrhage on CT. The posterior fossa, including the cerebellum, brainstem and fourth ventricle, is within normal limits. The third and lateral ventricles, and basal ganglia are unremarkable in appearance. The cerebral hemispheres are symmetric in  appearance, with normal gray-white differentiation. No mass effect or midline shift is seen. There is no evidence of fracture; visualized osseous structures are unremarkable in appearance. The orbits are within normal limits. The paranasal sinuses and mastoid air cells are well-aerated. No significant soft tissue abnormalities are seen. IMPRESSION: No evidence of traumatic intracranial injury or fracture Electronically Signed   By: Garald Balding M.D.   On: 12/11/2014 05:23   Mr Abdomen Mrcp Wo Cm  12/11/2014  CLINICAL DATA:  Abdominal pain. Left upper lobe adenocarcinoma, metastatic. EXAM: MRI ABDOMEN WITHOUT CONTRAST  (INCLUDING MRCP) TECHNIQUE: Multiplanar multisequence MR imaging of the abdomen was performed. Heavily T2-weighted images of the biliary and pancreatic ducts were obtained, and three-dimensional MRCP images were rendered by post processing. COMPARISON:  11/03/2014 ; 12/10/2014 FINDINGS: Despite efforts by the technologist and patient, motion artifact is present on today's exam and could not be eliminated. This reduces exam sensitivity and specificity. Lower chest: Massive pericardial effusion, new compared to 11/03/14. Considerable bilateral pleural effusions. Hepatobiliary: Multiple T2 hyperintense liver lesions are present. This includes a 1.9 by 1.3 cm lesion in segment 2; a 2.8 by 1.8 cm lesion in segment 3; a 3.0 by 2.4 cm lesion posteriorly in segment 4A near the IVC ; and several other smaller right hepatic lobe lesions. Periportal edema is present. This is nonspecific. Attempts at at diagnostic MRCP images worth awarded by motion artifact. There is likely some gallbladder wall thickening and suspected gallstones. No definite biliary dilatation. Pancreas: Grossly unremarkable. Spleen: Unremarkable Adrenals/Urinary Tract: Unremarkable Stomach/Bowel: Unremarkable Vascular/Lymphatic: Unremarkable Other: Upper abdominal ascites especially tracking around the spleen and in the paracolic gutters.  Musculoskeletal: Unremarkable where visualized. IMPRESSION: 1. Multiple hepatic lesions measuring up to 3 cm in long axis are T2 hyperintense. On sonography these appeared echogenic. On outside CT angiogram of the chest dated 10/13/2014 there was some arterial phase enhancement associated with some of these lesions. On the nuclear medicine PET-CT from 66/44/0347, no hypermetabolic liver lesion is observed. Accordingly, the possibility is certainly raised that these lesions represent hemangiomas  rather than necessarily being due to metastatic disease. Unfortunately IV contrast could not be administered to confirm this. As part of future staging workups when the patient is less acutely ill, contrast-enhanced hepatic protocol CT or MRI would be suggested. 2. Massive pericardial effusion, new compared to 11/03/2014. Correlation with Beck's triad or other signs of tamponade recommended. 3. Bilateral pleural effusions and ascites. 4. Thick-walled gallbladder, nonspecific, but cholecystitis is not excluded. Cholelithiasis. No definite biliary dilatation ; MRCP sequences or on feasible due to motion artifact. 5. Nonspecific periportal edema. These results will be called to the ordering clinician or representative by the Radiologist Assistant, and communication documented in the PACS or zVision Dashboard. Electronically Signed   By: Van Clines M.D.   On: 12/11/2014 07:53   Mr 3d Recon At Scanner  12/11/2014  CLINICAL DATA:  Abdominal pain. Left upper lobe adenocarcinoma, metastatic. EXAM: MRI ABDOMEN WITHOUT CONTRAST  (INCLUDING MRCP) TECHNIQUE: Multiplanar multisequence MR imaging of the abdomen was performed. Heavily T2-weighted images of the biliary and pancreatic ducts were obtained, and three-dimensional MRCP images were rendered by post processing. COMPARISON:  11/03/2014 ; 12/10/2014 FINDINGS: Despite efforts by the technologist and patient, motion artifact is present on today's exam and could not be  eliminated. This reduces exam sensitivity and specificity. Lower chest: Massive pericardial effusion, new compared to 11/03/14. Considerable bilateral pleural effusions. Hepatobiliary: Multiple T2 hyperintense liver lesions are present. This includes a 1.9 by 1.3 cm lesion in segment 2; a 2.8 by 1.8 cm lesion in segment 3; a 3.0 by 2.4 cm lesion posteriorly in segment 4A near the IVC ; and several other smaller right hepatic lobe lesions. Periportal edema is present. This is nonspecific. Attempts at at diagnostic MRCP images worth awarded by motion artifact. There is likely some gallbladder wall thickening and suspected gallstones. No definite biliary dilatation. Pancreas: Grossly unremarkable. Spleen: Unremarkable Adrenals/Urinary Tract: Unremarkable Stomach/Bowel: Unremarkable Vascular/Lymphatic: Unremarkable Other: Upper abdominal ascites especially tracking around the spleen and in the paracolic gutters. Musculoskeletal: Unremarkable where visualized. IMPRESSION: 1. Multiple hepatic lesions measuring up to 3 cm in long axis are T2 hyperintense. On sonography these appeared echogenic. On outside CT angiogram of the chest dated 10/13/2014 there was some arterial phase enhancement associated with some of these lesions. On the nuclear medicine PET-CT from 60/63/0160, no hypermetabolic liver lesion is observed. Accordingly, the possibility is certainly raised that these lesions represent hemangiomas rather than necessarily being due to metastatic disease. Unfortunately IV contrast could not be administered to confirm this. As part of future staging workups when the patient is less acutely ill, contrast-enhanced hepatic protocol CT or MRI would be suggested. 2. Massive pericardial effusion, new compared to 11/03/2014. Correlation with Beck's triad or other signs of tamponade recommended. 3. Bilateral pleural effusions and ascites. 4. Thick-walled gallbladder, nonspecific, but cholecystitis is not excluded.  Cholelithiasis. No definite biliary dilatation ; MRCP sequences or on feasible due to motion artifact. 5. Nonspecific periportal edema. These results will be called to the ordering clinician or representative by the Radiologist Assistant, and communication documented in the PACS or zVision Dashboard. Electronically Signed   By: Van Clines M.D.   On: 12/11/2014 07:53   US Abdomen Limited  12/10/2014  CLINICAL DATA:  Evaluate for cholecystitis/cause of hepatitis EXAM: US ABDOMEN LIMITED - RIGHT UPPER QUADRANT COMPARISON:  None. FINDINGS: Gallbladder: Multiple gallstones within the partially full gallbladder. The wall is circumferentially thickened to 6 mm. No pericholecystic edema. Sonographer reports focal tenderness. Common bile duct: Diameter: 2 mm Liver:  Three echogenic masses within the liver, ranging between 16 and 24 mm in maximal diameter, without hypoechoic halo. Antegrade flow in the imaged portal venous system. Right pleural effusion, small by chest radiography. IMPRESSION: 1. At least 3 liver masses, primarily concerning for lung cancer metastases. 2. Cholelithiasis with conflicting findings for acute cholecystitis. There is gallbladder wall thickening and focal tenderness, but the gallbladder is not dilated as usually seen with cystic duct obstruction and acute cholecystitis. 3. Right pleural effusion. Electronically Signed   By: Monte Fantasia M.D.   On: 12/10/2014 16:12     ASSESSMENT: 49 yo with history of lung CA Pt getting HIDA scan at time of my evaluation at  [facility].  BP 120s  HR 90s Reviewed echo  Massive pericardial effusion  Discussed with CV surgery service.  Plan for Tx to [facility] for pericardial window placement  Discussed surgery with pt and wife  Most likely effusion is etiology of spells he has been having.

## 2014-12-11 NOTE — Progress Notes (Signed)
Echocardiogram Echocardiogram Transesophageal has been performed.  Steven Roberts 12/11/2014, 3:00 PM

## 2014-12-11 NOTE — Progress Notes (Signed)
Pt unavail for EEG today. Will attempt tomorrow

## 2014-12-11 NOTE — Op Note (Addendum)
CARDIOTHORACIC SURGERY OPERATIVE NOTE  Date of Procedure:   12/11/2014  Preoperative Diagnosis:  Pericardial Effusion with Cardiac Tamponade  Postoperative Diagnosis:  same  Procedure:    [place] Pericardial Window  Surgeon:    Valentina Gu. Roxy Manns, MD  Assistant:    Lars Pinks, PA-C  Anesthesia:    Nolon Nations, MD  Operative Findings:   Large hemorrhagic pericardial effusion, total volume approx 1500 mL  Patient is not a candidate for pharmacologic anticoagulation at this time     BRIEF CLINICAL NOTE AND INDICATIONS FOR SURGERY  Patient is a 49 year old male with recently diagnosed stage IV adenocarcinoma of the lung who has been referred for urgent surgical consultation to assist with management of newly discovered large pericardial effusion with cardiac tamponade. Patient states that he has been healthy all of his life until recently. He is a nonsmoker. A proximally 7 months he first began to experience a persistent nonproductive cough. He was eventually diagnosed with stage IV (T1b, N3, M1b) adenocarcinoma of the lung for which she has been recently under the care of Dr. Julien Nordmann. He has been receiving Tarceva chemotherapy and radiation therapy. He was admitted to the hospital yesterday through the cancer center after percent in with worsening symptoms of abdominal discomfort, anorexia, shortness of breath, orthopnea, and lower extremity edema. Echocardiogram performed earlier today demonstrates a large pericardial effusion with early signs of cardiac tamponade. Cardiothoracic surgical consultation was requested.  The patient has been seen in consultation and counseled at length regarding the indications, risks and potential benefits of surgery.  All questions have been answered, and the patient provides full informed consent for the operation as described.      DETAILS OF THE OPERATIVE PROCEDURE  The patient is brought to the operating room on the above mentioned  date and placed in the supine position on the operating table. A radial arterial line and central venous catheter placed by the anesthesia team. Intravenous antibiotics are administered. Pneumatic sequential compression boots are placed on both lower extremities. General endotracheal anesthesia is induced uneventfully. A Foley catheter is placed. The patient's anterior chest abdomen and both groins are prepared and draped in a sterile manner.  A time out procedure is performed. A small vertical incision is made in the midline beginning at the xiphoid process and extending towards the umbilicus. The incision is completed through the subcutaneous tissue and linea alba using electrocautery. The left body of rectus abdominis muscle is retracted in a cephalad direction and the subjacent pre-peritoneal fat and fascia retracted inferiorly until the anterior surface of the pericardium was exposed. A small incision is made in the pericardium. A large amount of bloody fluid is immediately evacuated. Portions of the fluid are trapped to be sent for routine laboratory data, culture, and cytology. A total of 1500 mL of fluid is evacuated. Transesophageal echocardiogram was performed confirming evacuation of the majority of the pericardial effusion. The pericardial space and the left pleural space are each drained with 28 French Bard chest tubes placed through a separate stab incisions. The abdominal fascia is closed in the midline using interrupted #1 Vicryl suture. Subcutaneous tissues are closed in layers and the skin is closed with a running subcuticular skin closure.  The patient tolerated the procedure well, was extubated in the operating room, and transported to the postanesthesia care unit in stable condition. Estimated blood loss was trivial. There are no intraoperative complications. All sponge instrument and needle counts are verified correct at completion the operation.  Valentina Gu. Roxy Manns  MD 12/11/2014 1:24 PM

## 2014-12-11 NOTE — Progress Notes (Signed)
   12/11/14 0430  What Happened  Was fall witnessed? No  Was patient injured? Yes  Patient found on floor  Found by Staff-comment Jackquline Denmark heard fall and came to room. Roger Kill in room ne)  Stated prior activity other (comment) (sitting on side of bed)  Follow Up  MD notified T. Rogue Bussing, NP  Time MD notified [phone]  Family notified Yes-comment (wife in room with patient)  Time family notified 4 (family in room)  Additional tests Yes-comment (Stat head CT)  Simple treatment Other (comment) (pt refused ice for head)  Adult Fall Risk Assessment  Risk Factor Category (scoring not indicated) Fall has occurred during this admission (document High fall risk)  Age 49  Fall History: Fall within 6 months prior to admission 0  Elimination; Bowel and/or Urine Incontinence 0  Elimination; Bowel and/or Urine Urgency/Frequency 0  Medications: includes PCA/Opiates, Anti-convulsants, Anti-hypertensives, Diuretics, Hypnotics, Laxatives, Sedatives, and Psychotropics 3  Patient Care Equipment 1  Mobility-Assistance 2  Mobility-Gait 0  Mobility-Sensory Deficit 0  Cognition-Awareness 0  Cognition-Impulsiveness 0  Cognition-Limitations 0  Total Score 6  Patient's Fall Risk High Fall Risk (>13 points)  Adult Fall Risk Interventions  Required Bundle Interventions *See Row Information* High fall risk - low, moderate, and high requirements implemented  Additional Interventions Fall risk signage;Individualized elimination schedule;Secure all tubes/drains;Use of appropriate toileting equipment (bedpan, BSC, etc.)  Fall with Injury Screening  Risk For Fall Injury- See Row Information  Nurse judgement  Intervention(s) for 2 or more risk criteria identified Gait Belt  Pain Assessment  Pain Assessment 0-10  Pain Score 5  Pain Type Chronic pain  Pain Location Back (denies any head pain)  Pain Descriptors / Indicators Aching  Pain Frequency Intermittent  Pain Onset On-going  Patients Stated Pain  Goal 3  Pain Intervention(s) Repositioned;Emotional support (RN and MD aware)  Multiple Pain Sites No  PCA/Epidural/Spinal Assessment  Respiratory Pattern Regular  Neurological  Neuro (WDL) WDL  Level of Consciousness Alert  Orientation Level Oriented X4  Speech Clear  Pupil Assessment  Yes  R Pupil Size (mm) 3  R Pupil Shape Round  R Pupil Reaction Brisk  L Pupil Size (mm) 3  L Pupil Shape Round  L Pupil Reaction Brisk  Additional Pupil Assessments No  Neuro Additional Assessments Yes  Musculoskeletal  Musculoskeletal (WDL) WDL  Integumentary  Integumentary (WDL) X  Skin Integrity Abrasion  Abrasion Location Head  Abrasion Location Orientation Right;Posterior  Abrasion Intervention Other (Comment) (pt refused ice)

## 2014-12-11 NOTE — Transfer of Care (Signed)
Immediate Anesthesia Transfer of Care Note  Patient: Steven Roberts  Procedure(s) Performed: Procedure(s): SUBXYPHOID PERICARDIAL WINDOW (N/A) TRANSESOPHAGEAL ECHOCARDIOGRAM (TEE) (N/A)  Patient Location: PACU  Anesthesia Type:General  Level of Consciousness: patient cooperative and responds to stimulation  Airway & Oxygen Therapy: Patient Spontanous Breathing and Patient connected to face mask oxygen  Post-op Assessment: Report given to RN and Post -op Vital signs reviewed and stable  Post vital signs: Reviewed and stable  Last Vitals:  Filed Vitals:   12/11/14 1336  BP:   Pulse:   Temp: 36.4 C  Resp:     Complications: No apparent anesthesia complications

## 2014-12-11 NOTE — Progress Notes (Signed)
Steven Roberts   DOB:30-Dec-1965   KV#:[id number]   FIE#:[id number]  INPATIENT PROGRESS NOTE  Patient Care Team: Pcp Not In System as PCP - General (Emergency Medicine)  Subjective: Patient seen and examined. He is a 49 year old man with a recent diagnosis of Non Small Cell Carcinoma, Adenocarcinoma, on Tarceva and concurrent radiation, admitted on 10/27 after presenting to the office of the [place] with multiple complaints, including generalized  fatigue, abdominal swelling with non specific pain, peripheral edema, mild facial rash while on the med. He was afebrile, without chills or night sweats. He denied vision changes, or mucositis. Denies any chest pain or palpitations. No bleeding issues were reported. Status was complicated by acute mental status changes in the setting of abnormal liver functions and liver mets. He was transferred to the stepdown unit. He is feeling better since admission by the Hospitalist team. On admission, abdominal ultrasound revealed 3 liver masses likely metastatic, cholelithiasis and right pleural effusion. HIDA is pending, and MRCP is negative for biliary dilatation. He was initiated on IV Unasyn, with cultures pending. He is NPO. Surgery on board. LFTs and WBCs are now trending down slowly .Other studies included 2 D echo after massive pericardial effusion was noted on the MRI abdomen, results pending. We were kindly requested to see the patient.    Scheduled Meds: . ampicillin-sulbactam (UNASYN) IV  3 g Intravenous Q6H  . docusate sodium  100 mg Oral BID  . feeding supplement  1 Container Oral TID BM  . heparin  5,000 Units Subcutaneous 3 times per day  . insulin aspart  0-9 Units Subcutaneous TID WC  . sodium chloride  3 mL Intravenous Q12H   Continuous Infusions:  PRN Meds:.sodium chloride, morphine injection, ondansetron **OR** ondansetron (ZOFRAN) IV, oxyCODONE, sodium chloride  Objective:  Filed Vitals:   12/11/14 0900  BP: 120/69  Pulse: 95  Temp:  97.8 F (36.6 C)  Resp: 22      Intake/Output Summary (Last 24 hours) at 12/11/14 1000 Last data filed at 12/11/14 0700  Gross per 24 hour  Intake    440 ml  Output      0 ml  Net    440 ml    ECOG PERFORMANCE STATUS:3-4  GENERAL:alert, ill appearing, no acute distress EYES: normal, conjunctiva are pink and non-injected, sclera clear OROPHARYNX:no exudate, no erythema and lips, buccal mucosa, and tongue normal  LUNGS: decreased breath sounds at the bases HEART: regular rate & rhythm and no murmurs and 2+ lower extremity edema ABDOMEN: somewhat distended, RUQ tenderness, and normal bowel sounds Musculoskeletal:no cyanosis of digits and no clubbing  NEURO: no focal motor/sensory deficits. Had one episode of blank stare on admission, evaluation ongoing.    CBG (last 3)   Recent Labs  12/10/14 2206 12/11/14 0754  GLUCAP 203* 211*     Labs:   Recent Labs Lab 12/10/14 1017 12/10/14 1508 12/11/14 0537  WBC 13.3* 13.9* 12.7*  HGB 10.8* 11.1* 10.5*  HCT 33.1* 33.7* 32.3*  PLT 532* 546* 516*  MCV 85.3 84.9 85.9  MCH 27.8 28.0 27.9  MCHC 32.6 32.9 32.5  RDW 16.1* 15.8* 16.2*  LYMPHSABS 0.8* 1.0  --   MONOABS 1.1* 1.3*  --   EOSABS 0.0 0.0  --   BASOSABS 0.0 0.0  --      Chemistries:    Recent Labs Lab 12/10/14 1017 12/10/14 1508 12/11/14 0537  NA 127* 126* 129*  K 4.8 4.6 4.3  CL  --  93*  97*  CO2 23 23 21*  GLUCOSE 289* 231* 222*  BUN 41.3* 42* 37*  CREATININE 1.2 1.11 1.04  CALCIUM 8.9 8.7* 8.7*  AST 179 Repeated and Verified* 169* 117*  ALT 540 Repeated and Verified* 505* 422*  ALKPHOS 227* 218* 192*  BILITOT 3.13* 3.3* 3.2*    GFR Estimated Creatinine Clearance: 94.4 mL/min (by C-G formula based on Cr of 1.04).  Liver Function Tests:  Recent Labs Lab 12/10/14 1017 12/10/14 1508 12/11/14 0537  AST 179 Repeated and Verified* 169* 117*  ALT 540 Repeated and Verified* 505* 422*  ALKPHOS 227* 218* 192*  BILITOT 3.13* 3.3* 3.2*  PROT 6.5  7.3 6.6  ALBUMIN 3.0* 3.4* 3.0*    Recent Labs Lab 12/10/14 1508  LIPASE 51    Recent Labs Lab 12/11/14 0750  AMMONIA 36*    Urine Studies     Component Value Date/Time   COLORURINE AMBER* 12/10/2014 1553   APPEARANCEUR CLOUDY* 12/10/2014 1553   LABSPEC 1.022 12/10/2014 1553   PHURINE 6.0 12/10/2014 1553   GLUCOSEU NEGATIVE 12/10/2014 1553   HGBUR TRACE* 12/10/2014 1553   BILIRUBINUR NEGATIVE 12/10/2014 1553   KETONESUR NEGATIVE 12/10/2014 1553   PROTEINUR 100* 12/10/2014 1553   UROBILINOGEN 0.2 12/10/2014 1553   NITRITE NEGATIVE 12/10/2014 1553   LEUKOCYTESUR NEGATIVE 12/10/2014 1553    Coagulation profile  Recent Labs Lab 12/10/14 1508  INR 1.46    Cardiac Enzymes: No results for input(s): CKTOTAL, CKMB, CKMBINDEX, TROPONINI in the last 168 hours. BNP: Invalid input(s): POCBNP CBG:  Recent Labs Lab 12/10/14 2206 12/11/14 0754  GLUCAP 203* 211*   D-Dimer No results for input(s): DDIMER in the last 72 hours. Hgb A1c  Recent Labs  12/10/14 1508  HGBA1C 8.0*       Imaging Studies:  Dg Chest 2 View  12/10/2014  CLINICAL DATA:  Cough and shortness of breath. Current diagnosis of lung cancer. EXAM: CHEST  2 VIEW COMPARISON:  PET-CT dated 11/03/2014 FINDINGS: Cardiomediastinal silhouette is enlarged. Mediastinal contours is obscured by a large left perihilar mass. There are bilateral pleural effusions with associated subsegmental bibasilar atelectasis. Osseous structures are without acute abnormality. Soft tissues are grossly normal. IMPRESSION: Large left perihilar mass, consistent with known primary lung malignancy. Bilateral pleural effusions, moderate in size. Enlarged cardiac silhouette. Pericardial effusion cannot be excluded. Electronically Signed   By: Fidela Salisbury M.D.   On: 12/10/2014 15:21   Ct Head Wo Contrast  12/11/2014  CLINICAL DATA:  Rolled out of bed and hit right upper posterior head. Initial encounter. EXAM: CT HEAD WITHOUT  CONTRAST TECHNIQUE: Contiguous axial images were obtained from the base of the skull through the vertex without intravenous contrast. COMPARISON:  MRI of the brain performed 11/19/2014 FINDINGS: There is no evidence of acute infarction, mass lesion, or intra- or extra-axial hemorrhage on CT. The posterior fossa, including the cerebellum, brainstem and fourth ventricle, is within normal limits. The third and lateral ventricles, and basal ganglia are unremarkable in appearance. The cerebral hemispheres are symmetric in appearance, with normal gray-white differentiation. No mass effect or midline shift is seen. There is no evidence of fracture; visualized osseous structures are unremarkable in appearance. The orbits are within normal limits. The paranasal sinuses and mastoid air cells are well-aerated. No significant soft tissue abnormalities are seen. IMPRESSION: No evidence of traumatic intracranial injury or fracture Electronically Signed   By: Garald Balding M.D.   On: 12/11/2014 05:23   Mr Abdomen Mrcp Wo Cm  12/11/2014  CLINICAL  DATA:  Abdominal pain. Left upper lobe adenocarcinoma, metastatic. EXAM: MRI ABDOMEN WITHOUT CONTRAST  (INCLUDING MRCP) TECHNIQUE: Multiplanar multisequence MR imaging of the abdomen was performed. Heavily T2-weighted images of the biliary and pancreatic ducts were obtained, and three-dimensional MRCP images were rendered by post processing. COMPARISON:  11/03/2014 ; 12/10/2014 FINDINGS: Despite efforts by the technologist and patient, motion artifact is present on today's exam and could not be eliminated. This reduces exam sensitivity and specificity. Lower chest: Massive pericardial effusion, new compared to 11/03/14. Considerable bilateral pleural effusions. Hepatobiliary: Multiple T2 hyperintense liver lesions are present. This includes a 1.9 by 1.3 cm lesion in segment 2; a 2.8 by 1.8 cm lesion in segment 3; a 3.0 by 2.4 cm lesion posteriorly in segment 4A near the IVC ; and  several other smaller right hepatic lobe lesions. Periportal edema is present. This is nonspecific. Attempts at at diagnostic MRCP images worth awarded by motion artifact. There is likely some gallbladder wall thickening and suspected gallstones. No definite biliary dilatation. Pancreas: Grossly unremarkable. Spleen: Unremarkable Adrenals/Urinary Tract: Unremarkable Stomach/Bowel: Unremarkable Vascular/Lymphatic: Unremarkable Other: Upper abdominal ascites especially tracking around the spleen and in the paracolic gutters. Musculoskeletal: Unremarkable where visualized. IMPRESSION: 1. Multiple hepatic lesions measuring up to 3 cm in long axis are T2 hyperintense. On sonography these appeared echogenic. On outside CT angiogram of the chest dated 10/13/2014 there was some arterial phase enhancement associated with some of these lesions. On the nuclear medicine PET-CT from 17/71/1657, no hypermetabolic liver lesion is observed. Accordingly, the possibility is certainly raised that these lesions represent hemangiomas rather than necessarily being due to metastatic disease. Unfortunately IV contrast could not be administered to confirm this. As part of future staging workups when the patient is less acutely ill, contrast-enhanced hepatic protocol CT or MRI would be suggested. 2. Massive pericardial effusion, new compared to 11/03/2014. Correlation with Beck's triad or other signs of tamponade recommended. 3. Bilateral pleural effusions and ascites. 4. Thick-walled gallbladder, nonspecific, but cholecystitis is not excluded. Cholelithiasis. No definite biliary dilatation ; MRCP sequences or on feasible due to motion artifact. 5. Nonspecific periportal edema. These results will be called to the ordering clinician or representative by the Radiologist Assistant, and communication documented in the PACS or zVision Dashboard. Electronically Signed   By: Van Clines M.D.   On: 12/11/2014 07:53   Mr 3d Recon At  Scanner  12/11/2014  CLINICAL DATA:  Abdominal pain. Left upper lobe adenocarcinoma, metastatic. EXAM: MRI ABDOMEN WITHOUT CONTRAST  (INCLUDING MRCP) TECHNIQUE: Multiplanar multisequence MR imaging of the abdomen was performed. Heavily T2-weighted images of the biliary and pancreatic ducts were obtained, and three-dimensional MRCP images were rendered by post processing. COMPARISON:  11/03/2014 ; 12/10/2014 FINDINGS: Despite efforts by the technologist and patient, motion artifact is present on today's exam and could not be eliminated. This reduces exam sensitivity and specificity. Lower chest: Massive pericardial effusion, new compared to 11/03/14. Considerable bilateral pleural effusions. Hepatobiliary: Multiple T2 hyperintense liver lesions are present. This includes a 1.9 by 1.3 cm lesion in segment 2; a 2.8 by 1.8 cm lesion in segment 3; a 3.0 by 2.4 cm lesion posteriorly in segment 4A near the IVC ; and several other smaller right hepatic lobe lesions. Periportal edema is present. This is nonspecific. Attempts at at diagnostic MRCP images worth awarded by motion artifact. There is likely some gallbladder wall thickening and suspected gallstones. No definite biliary dilatation. Pancreas: Grossly unremarkable. Spleen: Unremarkable Adrenals/Urinary Tract: Unremarkable Stomach/Bowel: Unremarkable Vascular/Lymphatic: Unremarkable Other: Upper abdominal  ascites especially tracking around the spleen and in the paracolic gutters. Musculoskeletal: Unremarkable where visualized. IMPRESSION: 1. Multiple hepatic lesions measuring up to 3 cm in long axis are T2 hyperintense. On sonography these appeared echogenic. On outside CT angiogram of the chest dated 10/13/2014 there was some arterial phase enhancement associated with some of these lesions. On the nuclear medicine PET-CT from 99/37/1696, no hypermetabolic liver lesion is observed. Accordingly, the possibility is certainly raised that these lesions represent  hemangiomas rather than necessarily being due to metastatic disease. Unfortunately IV contrast could not be administered to confirm this. As part of future staging workups when the patient is less acutely ill, contrast-enhanced hepatic protocol CT or MRI would be suggested. 2. Massive pericardial effusion, new compared to 11/03/2014. Correlation with Beck's triad or other signs of tamponade recommended. 3. Bilateral pleural effusions and ascites. 4. Thick-walled gallbladder, nonspecific, but cholecystitis is not excluded. Cholelithiasis. No definite biliary dilatation ; MRCP sequences or on feasible due to motion artifact. 5. Nonspecific periportal edema. These results will be called to the ordering clinician or representative by the Radiologist Assistant, and communication documented in the PACS or zVision Dashboard. Electronically Signed   By: Van Clines M.D.   On: 12/11/2014 07:53   US Abdomen Limited  12/10/2014  CLINICAL DATA:  Evaluate for cholecystitis/cause of hepatitis EXAM: US ABDOMEN LIMITED - RIGHT UPPER QUADRANT COMPARISON:  None. FINDINGS: Gallbladder: Multiple gallstones within the partially full gallbladder. The wall is circumferentially thickened to 6 mm. No pericholecystic edema. Sonographer reports focal tenderness. Common bile duct: Diameter: 2 mm Liver: Three echogenic masses within the liver, ranging between 16 and 24 mm in maximal diameter, without hypoechoic halo. Antegrade flow in the imaged portal venous system. Right pleural effusion, small by chest radiography. IMPRESSION: 1. At least 3 liver masses, primarily concerning for lung cancer metastases. 2. Cholelithiasis with conflicting findings for acute cholecystitis. There is gallbladder wall thickening and focal tenderness, but the gallbladder is not dilated as usually seen with cystic duct obstruction and acute cholecystitis. 3. Right pleural effusion. Electronically Signed   By: Monte Fantasia M.D.   On: 12/10/2014 16:12     Assessment/Plan: 49 y.o.   Metastatic non-small cell lung cancer, adenocarcinoma with positive EGFR mutation.  The patient was started on treatment with targeted therapy with Tarceva 150 mg by mouth daily on 11/26/2014 with concurrent radiation.  MRI abdomen shows suspicious liver lesions. Tarceva is on hold due to current hospitalization for elevated transaminases, will likely resume when normalized  Elevated Transaminases Hyperbilirubinemia Likely due to Tarceva, currently on hold Transaminases are slowly improving  Abdominal Ultrasound was equivocal for cholecystitis. Surgery evaluating MRCP is negative for biliary dilatation.and WBCs are now trending down slowly . Burnett Harry is pending On IV Unasyn Appreciate all the care provided by the Hospitalist team  Hyponatremia In the setting of malignancy and hypovolemia Fluids on hold to avoid pulmonary edema Urine sodium is pending.   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, malnutrition,  No transfusion is indicated at this time Monitor counts closely Transfuse blood to maintain a Hb of 8 g or if the patient is acutely bleeding  Thrombocytopenia This is due to malignancy, dilution, infection, blood loss  No bleeding issues reported at this time Monitor counts closely No  transfusion is indicated at this time Transfuse 1 unit of platelets if count is less or equal than 10,000 or 20,000 if the patient is acutely bleeding Hold Lovenox if  platelets drop to less than 50,000  Leukocytosis This is likely reactive No intervention is indicated at this time WBCs slowly trending to normal Will continue to monitor  Acute mental status  changes In the setting of malignancy, liver failure Patient had episodes of blank stare overnight This is being evaluated EEG was ordered  Malnutrition Appreciate Nutrition evaluation  DVT prophylaxis On heparin  Full Code   Other medical issues as per admitting team     [facility] E, PA-C 12/11/2014  10:00 AM ---------------------------------------------------------------------------------------------------------------------------------------------------------- HEMATOLOGY-ONCOLOGY PROGRESS NOTE  SUBJECTIVE: S/P pericardial window for Tamponade. Feeling much better. Sitting in chair. Breathing improved.  OBJECTIVE: PHYSICAL EXAMINATION: ECOG PERFORMANCE STATUS: 2 - Symptomatic, <50% confined to bed  Filed Vitals:   12/12/14 0755  BP: 106/55  Pulse: 89  Temp: 97.6 F (36.4 C)  Resp: 20   Filed Weights   12/10/14 2103 12/11/14 0438 12/11/14 2020  Weight: 164 lb (74.39 kg) 192 lb 10.9 oz (87.4 kg) 217 lb 9.5 oz (98.7 kg)    GENERAL:alert, no distress and comfortable LUNGS: clear to auscultation HEART: regular rate & rhythm ABDOMEN:abdomen soft, non-tender and normal bowel sounds Musculoskeletal:no cyanosis of digits and no clubbing  NEURO: alert & oriented x 3 with fluent speech, no focal motor/sensory deficits  LABORATORY DATA:  I have reviewed the data as listed CMP Latest Ref Rng 12/12/2014 12/11/2014 12/10/2014  Glucose 65 - 99 mg/dL 212(H) 222(H) 231(H)  BUN 6 - 20 mg/dL 23(H) 37(H) 42(H)  Creatinine 0.61 - 1.24 mg/dL 0.84 1.04 1.11  Sodium 135 - 145 mmol/L 127(L) 129(L) 126(L)  Potassium 3.5 - 5.1 mmol/L 4.5 4.3 4.6  Chloride 101 - 111 mmol/L 96(L) 97(L) 93(L)  CO2 22 - 32 mmol/L 26 21(L) 23  Calcium 8.9 - 10.3 mg/dL 8.2(L) 8.7(L) 8.7(L)  Total Protein 6.5 - 8.1 g/dL - 6.6 7.3  Total Bilirubin 0.3 - 1.2 mg/dL - 3.2(H) 3.3(H)  Alkaline Phos 38 - 126 U/L - 192(H) 218(H)  AST 15 - 41 U/L - 117(H) 169(H)  ALT 17 - 63 U/L - 422(H) 505(H)    Lab  Results  Component Value Date   WBC 10.8* 12/12/2014   HGB 10.3* 12/12/2014   HCT 31.2* 12/12/2014   MCV 85.7 12/12/2014   PLT 401* 12/12/2014   NEUTROABS 11.6* 12/10/2014    ASSESSMENT AND PLAN: 1. Metastatic lung cancer with bone mets: Was on Tarceva. Being held currently for elevated LFTs. 2. Liver MRI: May not be mets based on the MRI. 3. Elevated LFTS: will need to monitor daily. He may be able to resume Tarceva once LFTs improve 4. 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 Now on hold. He may be able to resume after he stabilizes Dr. Julien Nordmann will follow from Monday.

## 2014-12-11 NOTE — Progress Notes (Signed)
Around 0620 RN saw pt in room with a blank look on face, blue color to skin, non-responsive. Pt said he started to cough and doesn't remember anything else that happened after that until he came responsive again. Rapid was called, MD notified. Pt vital signs are normal, LOC good, alert and oriented X 4, neuro checks normal and reactive. Pt wife stated that this is not the first time pt has had an episode like this since starting medicine for cancer. RN coming on has been notified and updated on pt. Pt is being transferred to step down for closer observation. Carmela Hurt, RN 7:46 AM 12/11/2014

## 2014-12-11 NOTE — Consult Note (Signed)
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  Patient states that he has been healthy all of his life until recently. He is a nonsmoker. A proximally 7 months he first began to experience a persistent nonproductive cough. He was eventually diagnosed with stage IV (T1b, N3, M1b) adenocarcinoma of the lung for which she has been recently under the care of Dr. Julien Nordmann. He has been receiving Tarceva chemotherapy and radiation therapy.  He was admitted to the hospital yesterday through the cancer center after percent in with worsening symptoms of abdominal discomfort, anorexia, shortness of breath, orthopnea, and lower extremity edema. Echocardiogram performed earlier today demonstrates a large pericardial effusion with early signs of cardiac tamponade. Cardiothoracic surgical consultation was requested.  The patient is married and lives locally in [name] with his wife. He has a 60 year old son. He has worked as a Curator until recently. He describes a rapid decline over the last several months with 30 pound weight loss, persistent cough, fatigue, anorexia, and recent development of abdominal discomfort, shortness of breath, and orthopnea. He denies any fevers chills or productive cough. Bowel function has been normal.  Past Medical History  Diagnosis Date  . Bone cancer ([place]) 11/03/14 Pet scan    left 6th rib, right hip  . Lung cancer ([facility]) 10/23/14    LUL adenocarcinoma, non-small cell  . Pericardial effusion with cardiac  tamponade 12/11/2014  . Pulmonary infiltrates  c/w Adenoca/ Stage IV  10/13/2014    - symptom onset march 2016 - Baseline cxr on July 20 2014 reported to be nl  > then CT chest 10/13/2014 LUL as dz  Assoc with   LUL nodular density/ mild adenopathy - Quantiferon Gold 10/13/2014 > neg  - FOB 10/23/14 > cobblestoning beyond Lingular orifice with diffuse narrowing x 50% of LUL beyond lingula> adenoca - PET 11/03/14 1. The dominant left upper lobe mass and extensive adenopathy involving th  . Non-small cell carcinoma of lung, stage 4 ([place]) 11/12/2014  . Hypoalbuminemia due to protein-calorie malnutrition ([facility]) 12/02/2014  . Hyponatremia 12/10/2014  . Hyperphosphatemia 12/02/2014  . Bone metastasis ([place]) 11/25/2014    Past Surgical History  Procedure Laterality Date  . Video bronchoscopy Bilateral 10/23/2014    Procedure: VIDEO BRONCHOSCOPY WITHOUT FLUORO;  Surgeon: Tanda Rockers, MD;  Location: [facility];  Service: Cardiopulmonary;  Laterality: Bilateral;    Family History  Problem Relation Age of Onset  . Cancer Father     Prostate cancer  . Diabetes Mother     Social History   Social History  . Marital Status: Married    Spouse Name: N/A  . Number of Children: 1  . Years of Education: N/A   Occupational History  . [employer]    Social History Main Topics  . Smoking status: Never Smoker   . Smokeless tobacco: Never Used  . Alcohol Use: No  . Drug Use: No  . Sexual Activity: Not on file  Other Topics Concern  . Not on file   Social History Narrative    Prior to Admission medications   Medication Sig Start Date End Date Taking? Authorizing Provider  erlotinib (TARCEVA) 150 MG tablet Take 1 tablet (150 mg total) by mouth daily. Take on an empty stomach 1 hour before meals or 2 hours after. 11/12/14  Yes Curt Bears, MD  hyaluronate sodium (RADIAPLEXRX) GEL Apply 1 application topically daily. 12/08/14  Yes Kyung Rudd, MD  ibuprofen (ADVIL,MOTRIN) 200 MG tablet Take 200 mg by  mouth every 6 (six) hours as needed for moderate pain.    Yes Historical Provider, MD  HYDROcodone-acetaminophen (NORCO/VICODIN) 5-325 MG tablet Take 1-2 tablets by mouth every 4 (four) hours as needed for moderate pain (or cough). Patient not taking: Reported on 12/10/2014 11/25/14   Kyung Rudd, MD  ondansetron (ZOFRAN) 8 MG tablet Take 1 tablet (8 mg total) by mouth every 8 (eight) hours as needed for nausea or vomiting. Patient not taking: Reported on 12/10/2014 12/02/14   Susanne Borders, NP    Current Facility-Administered Medications  Medication Dose Route Frequency Provider Last Rate Last Dose  . [MAR Hold] 0.9 %  sodium chloride infusion  250 mL Intravenous PRN Belkys A Regalado, MD      . Doug Sou Hold] Ampicillin-Sulbactam (UNASYN) 3 g in sodium chloride 0.9 % 100 mL IVPB  3 g Intravenous Q6H Thomes Lolling, RPH   3 g at 12/11/14 0316  . [MAR Hold] docusate sodium (COLACE) capsule 100 mg  100 mg Oral BID Belkys A Regalado, MD   100 mg at 12/10/14 2209  . [MAR Hold] feeding supplement (BOOST / RESOURCE BREEZE) liquid 1 Container  1 Container Oral TID BM Elmarie Shiley, MD   1 Container at 12/10/14 2105  . [MAR Hold] feeding supplement (ENSURE ENLIVE) (ENSURE ENLIVE) liquid 237 mL  237 mL Oral BID BM Maricela Bo Ostheim, RD   237 mL at 12/11/14 1030  . [MAR Hold] heparin injection 5,000 Units  5,000 Units Subcutaneous 3 times per day Elmarie Shiley, MD   5,000 Units at 12/10/14 2209  . [MAR Hold] insulin aspart (novoLOG) injection 0-9 Units  0-9 Units Subcutaneous TID WC Belkys A Regalado, MD   3 Units at 12/11/14 0800  . lactated ringers infusion   Intravenous Continuous Nolon Nations, MD 10 mL/hr at 12/11/14 1120    . [MAR Hold] morphine 2 MG/ML injection 1 mg  1 mg Intravenous Q4H PRN Belkys A Regalado, MD   1 mg at 12/11/14 0317  . [MAR Hold] ondansetron (ZOFRAN) tablet 4 mg  4 mg Oral Q6H PRN Belkys A Regalado, MD       Or  . Doug Sou Hold] ondansetron (ZOFRAN) injection 4 mg  4 mg  Intravenous Q6H PRN Belkys A Regalado, MD      . Doug Sou Hold] oxyCODONE (Oxy IR/ROXICODONE) immediate release tablet 5 mg  5 mg Oral Q4H PRN Belkys A Regalado, MD      . Doug Sou Hold] sodium chloride 0.9 % injection 3 mL  3 mL Intravenous Q12H Belkys A Regalado, MD   3 mL at 12/10/14 2209  . [MAR Hold] sodium chloride 0.9 % injection 3 mL  3 mL Intravenous PRN Belkys A Regalado, MD        Allergies  Allergen Reactions  . Hydrocodone-Acetaminophen Nausea Only and Other (See Comments)    Numbness       Review of Systems:   As per HPI - remainder  negative    Physical Exam:   BP 113/77 mmHg  Pulse 96  Temp(Src) 97.8 F (36.6 C) (Oral)  Resp 18  Ht '5\' 8"'$  (1.727 m)  Wt 87.4 kg (192 lb 10.9 oz)  BMI 29.30 kg/m2  SpO2 94%  General:  Thin male NAD  HEENT:  Unremarkable   Neck:   no JVD, no bruits, no adenopathy   Chest:   clear to auscultation, symmetrical breath sounds, no wheezes, no rhonchi   CV:   RRR, no  murmur   Abdomen:  soft, non-tender, no masses   Extremities:  warm, well-perfused, pulses thready but palpable, severe bilateral lower extremity edema  Rectal/GU  Deferred  Neuro:   Grossly non-focal and symmetrical throughout  Skin:   Clean and dry, diffuse erythematous rash, no breakdown  Diagnostic Tests:  Transthoracic Echocardiography  Patient:  Steven, Roberts MR #:    [id number] Study Date: 12/11/2014 Gender:   M Age:    34 Height:   172.7 cm Weight:   87.1 kg BSA:    2.07 m^2 Pt. Status: Room:    [place], Belkys A ATTENDING  Regalado, Belkys A ORDERING   Regalado, Belkys A REFERRING  Regalado, Belkys A PERFORMING  [facility], Inpatient SONOGRAPHER Mikki Santee  cc:  ------------------------------------------------------------------- LV EF: 55% -  60%  ------------------------------------------------------------------- Indications:   Dyspnea  786.09.  ------------------------------------------------------------------- History:  PMH: Lung cancer. Bone cancer.  ------------------------------------------------------------------- Study Conclusions  - Left ventricle: The cavity size was normal. Wall thickness was normal. Systolic function was normal. The estimated ejection fraction was in the range of 55% to 60%. Wall motion was normal; there were no regional wall motion abnormalities. - Aortic valve: There was mild regurgitation. - Pericardium, extracardiac: A large, free-flowing pericardial effusion was identified circumferential to the heart.  Impressions:  - Probable normal LV function although difficult to assess; large pericardial effusion with RV/RA collapse, respiratory variation and dilated IVC; findings consistent with tamponade physiology. Findings discussed with Dr Harrington Challenger.  Transthoracic echocardiography. M-mode, complete 2D, spectral Doppler, and color Doppler. Birthdate: Patient birthdate: Aug 31, 1965. Age: Patient is 49 yr old. Sex: Gender: male. BMI: 29.2 kg/m^2. Blood pressure:   103/68 Patient status: Inpatient. Study date: Study date: 12/11/2014. Study time: 08:48 AM. Location: ICU/CCU  -------------------------------------------------------------------  ------------------------------------------------------------------- Left ventricle: The cavity size was normal. Wall thickness was normal. Systolic function was normal. The estimated ejection fraction was in the range of 55% to 60%. Wall motion was normal; there were no regional wall motion abnormalities.  ------------------------------------------------------------------- Aortic valve:  Trileaflet; mildly calcified leaflets. Mobility was not restricted. Doppler: Transvalvular velocity was within the normal range. There was no stenosis. There was mild  regurgitation.  ------------------------------------------------------------------- Aorta: Aortic root: The aortic root was normal in size.  ------------------------------------------------------------------- Mitral valve:  Structurally normal valve.  Mobility was not restricted. Doppler: Transvalvular velocity was within the normal range. There was no evidence for stenosis. There was trivial regurgitation.  Peak gradient (D): 2 mm Hg.  ------------------------------------------------------------------- Left atrium: The atrium was normal in size.  ------------------------------------------------------------------- Right ventricle: The cavity size was normal. Systolic function was normal.  ------------------------------------------------------------------- Tricuspid valve:  Structurally normal valve.  Doppler: Transvalvular velocity was within the normal range. There was trivial regurgitation.  ------------------------------------------------------------------- Right atrium: The atrium was normal in size.  ------------------------------------------------------------------- Pericardium: A large, free-flowing pericardial effusion was identified circumferential to the heart.  ------------------------------------------------------------------- Systemic veins: Inferior vena cava: The vessel was dilated.  ------------------------------------------------------------------- Measurements  Left ventricle  Value    Reference LV ID, ED, PLAX chordal         45.4 mm   43 - 52 LV ID, ES, PLAX chordal         24.6 mm   23 - 38 LV fx shortening, PLAX chordal      46  %   >=29 LV PW thickness, ED           10.6 mm   --------- IVS/LV PW ratio, ED           0.81     <=1.3  Ventricular septum            Value    Reference IVS thickness, ED             8.55 mm   ---------  LVOT                   Value    Reference LVOT ID, S                25  mm   --------- LVOT area                4.91 cm^2  ---------  Aorta                  Value    Reference Aortic root ID, ED            27  mm   ---------  Left atrium               Value    Reference LA ID, A-P, ES              28  mm   --------- LA ID/bsa, A-P              1.36 cm/m^2 <=2.2 LA volume, S               30.5 ml   --------- LA volume/bsa, S             14.8 ml/m^2 --------- LA volume, ES, 1-p A4C          30.1 ml   --------- LA volume/bsa, ES, 1-p A4C        14.6 ml/m^2 --------- LA volume, ES, 1-p A2C          28.6 ml   --------- LA volume/bsa, ES, 1-p A2C        13.8 ml/m^2 ---------  Mitral valve               Value    Reference Mitral E-wave peak velocity       71.3 cm/s  --------- Mitral A-wave peak velocity       63.6 cm/s  --------- Mitral deceleration time         165  ms   150 - 230 Mitral peak gradient, D         2   mm Hg --------- Mitral E/A ratio, peak          1.1     ---------  Right ventricle             Value    Reference RV s&', lateral, S            20.9 cm/s  ---------  Legend: (L) and (H) mark values outside specified reference range.  ------------------------------------------------------------------- Prepared and Electronically Authenticated by  Kirk Ruths 2016-10-28T09:56:33   Impression:  I have personally  reviewed the patient's transthoracic echocardiogram and recent chest radiograph. He has known metastatic lung cancer and presents with a very large pericardial effusion and early  echocardiographic signs of cardiac tamponade.  The patient is hemodynamically stable without signs of hypotension or shock. I agree he would best be treated with definitive surgical drainage of his pericardial effusion.  Plan:  I have discussed the nature of the patient's current problem as well as the indications, risks, and potential benefits of drainage of his pericardial effusion with the patient at the bedside. We plan to proceed directly to the operating room for subxiphoid pericardial window.  He understands and accepts all potential risks of surgery and desires to proceed. All questions answered.   I spent in excess of 60 minutes during the conduct of this hospital consultation and >50% of this time involved direct face-to-face encounter for counseling and/or coordination of the patient's care.   Valentina Gu. Roxy Manns, MD 12/11/2014 11:42 AM

## 2014-12-11 NOTE — Progress Notes (Signed)
  Echocardiogram 2D Echocardiogram has been performed.  Jennette Dubin 12/11/2014, 9:28 AM

## 2014-12-11 NOTE — Progress Notes (Signed)
[facility] BRIEF PROGRESS NOTE  Day of Surgery  S/P Procedure(s) (LRB): SUBXYPHOID PERICARDIAL WINDOW (N/A) TRANSESOPHAGEAL ECHOCARDIOGRAM (TEE) (N/A)   Doing well in PACU.  Mild soreness.  Otherwise feels better NSR w/ stable BP O2 sats 96% on 3 L/min UOP adequate Minimal chest tube output since OR  Plan: D/C A-line.  Waiting for bed on 3S stepdown unit  Rexene Alberts, MD 12/11/2014 3:47 PM

## 2014-12-11 NOTE — Brief Op Note (Addendum)
12/10/2014 - 12/11/2014  1:23 PM  PATIENT:  Steven Roberts  49 y.o. male  PRE-OPERATIVE DIAGNOSIS:  1. LARGE PERICARDIAL EFFUSION with TAMPONADE 2. STAGE IV ADENOCARCINOMA of LEFT LUNG  POST-OPERATIVE DIAGNOSIS:  1.LARGE PERICARDIAL EFFUSION with TAMPONADE 2. STAGE IV ADENOCARCINOMA of LEFT LUNG  PROCEDURE: TRANSESOPHAGEAL ECHOCARDIOGRAM (TEE), SUBXYPHOID PERICARDIAL WINDOW   FINDINGS: Approximately 1500 cc of bloody fluid removed    SURGEON:  Surgeon(s) and Role:    * Rexene Alberts, MD - Primary  PHYSICIAN ASSISTANT: Lars Pinks PA-C  ANESTHESIA:   general  EBL:  Total I/O In: 300 [I.V.:300] Out: 1600 [Urine:100; Other:1500]  BLOOD ADMINISTERED:none  DRAINS: 43 Blake drain in the pericardial space and a 28 Blake drain in the left pleural space   SPECIMEN:  Source of Specimen:  Pericardial fluid and biopsy of the pericardium  DISPOSITION OF SPECIMEN:  Pathology and cytology  COUNTS CORRECT:  YES  DICTATION: .Dragon Dictation  PLAN OF CARE: Admit to inpatient   PATIENT DISPOSITION:  PACU - hemodynamically stable.   Delay start of Pharmacological VTE agent (>24hrs) due to surgical blood loss or risk of bleeding: yes  Rexene Alberts, MD 12/11/2014 1:32 PM

## 2014-12-11 NOTE — Progress Notes (Signed)
Initial Nutrition Assessment  DOCUMENTATION CODES:   Not applicable  INTERVENTION:  - Will order Ensure Enlive BID for when diet advanced, each supplement provides 350 kcal and 20 grams of protein - Complete physical assessment at follow-up - RD will continue to monitor for needs  NUTRITION DIAGNOSIS:   Increased nutrient needs related to catabolic illness, cancer and cancer related treatments as evidenced by estimated needs.  GOAL:   Patient will meet greater than or equal to 90% of their needs  MONITOR:   Diet advancement, Supplement acceptance, Weight trends, Labs, Skin, I & O's  REASON FOR ASSESSMENT:   Malnutrition Screening Tool  ASSESSMENT:   49 y.o. male Hispanic, with recent diagnosis of lung cancer 10-2014, metastasis diseases to lung. He has been receiving tarceva and radiation treatments. He went to see his oncologist today for regular follow up and was found to have abnormal labs and was refer to ED for further evaluation. He has been having abdominal pain, epigastric,RU quadrant and supra pubic pain for 1 week. He has had poor oral intake. He report epigastric pain with meals and early satiety. Denies vomiting, diarrhea. Had small Bowel movement yesterday.   Pt seen for MST. BMI indicates normal weight status. Pt out of the room and in Echo lab at time of RD visit. Spoke with pt's wife who was in his room and provided all information at this time.   She states that for the past 2 weeks pt has mainly been consuming and requesting soft foods and liquids such as popsicles, jello, and applesauce. Wife states that pt has had a few episodes of abdominal pain with PO intakes but no complaints of nausea. He was drinking protein shakes at times and she feels he would be open to receiving Ensure during hospitalization.   He has had a decreased appetite the past few months and had told his wife that he lost 30 lbs in this time frame; she is unable to give further specifics on  time frame. Per chart review; pt lost 29 lbs (15% body weight) in the 2 months PTA. Unable to do physical assessment at this time but suspect some decree of malnutrition is present although unable to confirm at this time.   Notes indicate that pt was having difficulty swallowing and burning sensation in throat PTA but wife denies this/is unaware of this.  Not able to meet needs at this time. Will order Ensure for when diet advanced. Medications reviewed. Labs reviewed; Na: 129 mmol/L, Cl: 97 mmol/L, BUN elevated, Ca: 8.7 mg/dL, LFTs elevated.   Diet Order:  Diet NPO time specified  Skin:  Reviewed, no issues  Last BM:  10/27  Height:   Ht Readings from Last 1 Encounters:  12/11/14 '5\' 8"'$  (1.727 m)    Weight:   Wt Readings from Last 1 Encounters:  12/11/14 192 lb 10.9 oz (87.4 kg)    Ideal Body Weight:  70 kg (kg)  BMI:  Body mass index is 29.3 kg/(m^2).  Estimated Nutritional Needs:   Kcal:  2500-2700  Protein:  90-105 grams  Fluid:  2-2.2 L/day  EDUCATION NEEDS:         [name], RD, LDN Inpatient Clinical Dietitian Pager # [phone] After hours/weekend pager # [phone]

## 2014-12-11 NOTE — Progress Notes (Addendum)
[facility] PROGRESS NOTE  Steven Roberts FUX:[id number] DOB: 1965/08/08 DOA: 12/10/2014 PCP: Pcp Not In System  Assessment/Plan: 1-Transaminases, Hyperbilirubinemia, Abdominal Pain:  This could be related to cholelithiasis,cholecystitis vs adverse effect from chemo Tarceva, vs metastasis diseases.  IV Unasyn to cover for infection.  MRCP no biliary dilation// HIDA Scan pending.  Surgery consulted and following.  Clear diet and NPO after midnight for studies.  Hold tarceva LFT trending down, WBC trending down.   2-AMS, encephalopathy ?  Had blank spare. Will order MRI brain to rule out metastasis to brain. Will also order EEG.  Monitor closely in the step down unit.   3-Pericardial effusion: massive by MRI. Will change ECHO to STAT.  Cardiology consulted. SBP in the 100 range.   Hyponatremia;  Could be related to hypervolemia, but also patient has elevated BUN,component of dehydration.  urine sodium less than 10 , awaiting urine osmolality.  Hold on IV fluids to avoid pulmonary edema in setting of bilateral pleural effusion.  Improved. Follow trend.   4-Pleural effusion, Lower extremity edema;  Could be related to liver failure, ECHO ordered to evaluate for heart failure.  He will probably need Lasix if renal function remain stable and no hypotension due to infectious process.   5-Lung Cancer, metastasis ;  Dr Julien Nordmann added to rounding team.  New 3 liver lesions.   6-Diabetes: Hyperglycemia SSI.  HBA1c at 8, new diagnosis of diabetes.  Diabetes educator , consulted.    Code Status: full code.  Family Communication: care discussed with wife Disposition Plan: remain inpatient.    Consultants: surgery  Procedures:  HIDA  ECHO  Antibiotics:  Unasyn.   HPI/Subjective: He is feeling ok, he is alert. Had a difficult night. Doesn't remember how he fell. He denies headaches, dyspnea, or chest pain. He had an episode of blank spare. Non responsive. Vitals  stable. Per wife he has had this episodes at home/   Objective: Filed Vitals:   12/11/14 0701  BP: 117/75  Pulse: 94  Temp: 97.9 F (36.6 C)  Resp: 16    Intake/Output Summary (Last 24 hours) at 12/11/14 0747 Last data filed at 12/11/14 0700  Gross per 24 hour  Intake    440 ml  Output      0 ml  Net    440 ml   Filed Weights   12/10/14 2103 12/11/14 0438  Weight: 74.39 kg (164 lb) 87.4 kg (192 lb 10.9 oz)    Exam:   General:  Alert in no distress  Cardiovascular: S 1, S 2 distant  Respiratory: CTA  Abdomen: BS present, soft, nt  Musculoskeletal: plus 2 edema  Data Reviewed: Basic Metabolic Panel:  Recent Labs Lab 12/10/14 1017 12/10/14 1508 12/11/14 0537  NA 127* 126* 129*  K 4.8 4.6 4.3  CL  --  93* 97*  CO2 23 23 21*  GLUCOSE 289* 231* 222*  BUN 41.3* 42* 37*  CREATININE 1.2 1.11 1.04  CALCIUM 8.9 8.7* 8.7*   Liver Function Tests:  Recent Labs Lab 12/10/14 1017 12/10/14 1508 12/11/14 0537  AST 179 Repeated and Verified* 169* 117*  ALT 540 Repeated and Verified* 505* 422*  ALKPHOS 227* 218* 192*  BILITOT 3.13* 3.3* 3.2*  PROT 6.5 7.3 6.6  ALBUMIN 3.0* 3.4* 3.0*    Recent Labs Lab 12/10/14 1508  LIPASE 51   No results for input(s): AMMONIA in the last 168 hours. CBC:  Recent Labs Lab 12/10/14 1017 12/10/14 1508 12/11/14 0537  WBC 13.3* 13.9*  12.7*  NEUTROABS 11.3* 11.6*  --   HGB 10.8* 11.1* 10.5*  HCT 33.1* 33.7* 32.3*  MCV 85.3 84.9 85.9  PLT 532* 546* 516*   Cardiac Enzymes: No results for input(s): CKTOTAL, CKMB, CKMBINDEX, TROPONINI in the last 168 hours. BNP (last 3 results) No results for input(s): BNP in the last 8760 hours.  ProBNP (last 3 results) No results for input(s): PROBNP in the last 8760 hours.  CBG: No results for input(s): GLUCAP in the last 168 hours.  No results found for this or any previous visit (from the past 240 hour(s)).   Studies: Dg Chest 2 View  12/10/2014  CLINICAL DATA:  Cough  and shortness of breath. Current diagnosis of lung cancer. EXAM: CHEST  2 VIEW COMPARISON:  PET-CT dated 11/03/2014 FINDINGS: Cardiomediastinal silhouette is enlarged. Mediastinal contours is obscured by a large left perihilar mass. There are bilateral pleural effusions with associated subsegmental bibasilar atelectasis. Osseous structures are without acute abnormality. Soft tissues are grossly normal. IMPRESSION: Large left perihilar mass, consistent with known primary lung malignancy. Bilateral pleural effusions, moderate in size. Enlarged cardiac silhouette. Pericardial effusion cannot be excluded. Electronically Signed   By: Fidela Salisbury M.D.   On: 12/10/2014 15:21   Ct Head Wo Contrast  12/11/2014  CLINICAL DATA:  Rolled out of bed and hit right upper posterior head. Initial encounter. EXAM: CT HEAD WITHOUT CONTRAST TECHNIQUE: Contiguous axial images were obtained from the base of the skull through the vertex without intravenous contrast. COMPARISON:  MRI of the brain performed 11/19/2014 FINDINGS: There is no evidence of acute infarction, mass lesion, or intra- or extra-axial hemorrhage on CT. The posterior fossa, including the cerebellum, brainstem and fourth ventricle, is within normal limits. The third and lateral ventricles, and basal ganglia are unremarkable in appearance. The cerebral hemispheres are symmetric in appearance, with normal gray-white differentiation. No mass effect or midline shift is seen. There is no evidence of fracture; visualized osseous structures are unremarkable in appearance. The orbits are within normal limits. The paranasal sinuses and mastoid air cells are well-aerated. No significant soft tissue abnormalities are seen. IMPRESSION: No evidence of traumatic intracranial injury or fracture Electronically Signed   By: Garald Balding M.D.   On: 12/11/2014 05:23   US Abdomen Limited  12/10/2014  CLINICAL DATA:  Evaluate for cholecystitis/cause of hepatitis EXAM: US  ABDOMEN LIMITED - RIGHT UPPER QUADRANT COMPARISON:  None. FINDINGS: Gallbladder: Multiple gallstones within the partially full gallbladder. The wall is circumferentially thickened to 6 mm. No pericholecystic edema. Sonographer reports focal tenderness. Common bile duct: Diameter: 2 mm Liver: Three echogenic masses within the liver, ranging between 16 and 24 mm in maximal diameter, without hypoechoic halo. Antegrade flow in the imaged portal venous system. Right pleural effusion, small by chest radiography. IMPRESSION: 1. At least 3 liver masses, primarily concerning for lung cancer metastases. 2. Cholelithiasis with conflicting findings for acute cholecystitis. There is gallbladder wall thickening and focal tenderness, but the gallbladder is not dilated as usually seen with cystic duct obstruction and acute cholecystitis. 3. Right pleural effusion. Electronically Signed   By: Monte Fantasia M.D.   On: 12/10/2014 16:12    Scheduled Meds: . ampicillin-sulbactam (UNASYN) IV  3 g Intravenous Q6H  . docusate sodium  100 mg Oral BID  . feeding supplement  1 Container Oral TID BM  . heparin  5,000 Units Subcutaneous 3 times per day  . insulin aspart  0-9 Units Subcutaneous TID WC  . sodium chloride  3  mL Intravenous Q12H   Continuous Infusions:   Active Problems:   Hyperglycemia   Hyperbilirubinemia   Abnormal transaminases   Anasarca   Leukocytosis   Hyponatremia    Time spent: 35 minutes.     Niel Hummer A  [facility] Pager [phone]. If 7PM-7AM, please contact night-coverage at [email or web address], password [facility] 12/11/2014, 7:47 AM  LOS: 1 day

## 2014-12-11 NOTE — Anesthesia Procedure Notes (Signed)
Procedure Name: Intubation Date/Time: 12/11/2014 12:28 PM Performed by: Judeth Cornfield T Pre-anesthesia Checklist: Patient identified, Emergency Drugs available, Suction available, Patient being monitored and Timeout performed Patient Re-evaluated:Patient Re-evaluated prior to inductionOxygen Delivery Method: Circle system utilized Preoxygenation: Pre-oxygenation with 100% oxygen Intubation Type: IV induction Ventilation: Mask ventilation without difficulty Laryngoscope Size: Mac and 3 Tube size: 8.0 mm Number of attempts: 1 Intubation method: Pt w HOB at 90* for induction. Placement Confirmation: ETT inserted through vocal cords under direct vision,  breath sounds checked- equal and bilateral and positive ETCO2 Secured at: 22 cm Tube secured with: Tape Dental Injury: Teeth and Oropharynx as per pre-operative assessment

## 2014-12-12 ENCOUNTER — Inpatient Hospital Stay (HOSPITAL_COMMUNITY): Payer: Medicaid Other

## 2014-12-12 DIAGNOSIS — R739 Hyperglycemia, unspecified: Secondary | ICD-10-CM

## 2014-12-12 LAB — BLOOD GAS, ARTERIAL
ACID-BASE DEFICIT: 2.6 mmol/L — AB (ref 0.0–2.0)
BICARBONATE: 20.6 meq/L (ref 20.0–24.0)
O2 Content: 3 L/min
O2 Saturation: 99.3 %
PATIENT TEMPERATURE: 98.6
PCO2 ART: 29.3 mmHg — AB (ref 35.0–45.0)
PO2 ART: 133 mmHg — AB (ref 80.0–100.0)
TCO2: 21.5 mmol/L (ref 0–100)
pH, Arterial: 7.461 — ABNORMAL HIGH (ref 7.350–7.450)

## 2014-12-12 LAB — CBC
HEMATOCRIT: 31.2 % — AB (ref 39.0–52.0)
HEMOGLOBIN: 10.3 g/dL — AB (ref 13.0–17.0)
MCH: 28.3 pg (ref 26.0–34.0)
MCHC: 33 g/dL (ref 30.0–36.0)
MCV: 85.7 fL (ref 78.0–100.0)
Platelets: 401 10*3/uL — ABNORMAL HIGH (ref 150–400)
RBC: 3.64 MIL/uL — AB (ref 4.22–5.81)
RDW: 16.3 % — AB (ref 11.5–15.5)
WBC: 10.8 10*3/uL — AB (ref 4.0–10.5)

## 2014-12-12 LAB — BASIC METABOLIC PANEL
Anion gap: 5 (ref 5–15)
BUN: 23 mg/dL — AB (ref 6–20)
CALCIUM: 8.2 mg/dL — AB (ref 8.9–10.3)
CO2: 26 mmol/L (ref 22–32)
CREATININE: 0.84 mg/dL (ref 0.61–1.24)
Chloride: 96 mmol/L — ABNORMAL LOW (ref 101–111)
GFR calc non Af Amer: >60 mL/min (ref >60)
Glucose, Bld: 212 mg/dL — ABNORMAL HIGH (ref 65–99)
Potassium: 4.5 mmol/L (ref 3.5–5.1)
Sodium: 127 mmol/L — ABNORMAL LOW (ref 135–145)

## 2014-12-12 LAB — GLUCOSE, CAPILLARY
Glucose-Capillary: 173 mg/dL — ABNORMAL HIGH (ref 65–99)
Glucose-Capillary: 206 mg/dL — ABNORMAL HIGH (ref 65–99)
Glucose-Capillary: 202 mg/dL — ABNORMAL HIGH (ref 65–99)
Glucose-Capillary: 192 mg/dL — ABNORMAL HIGH (ref 65–99)

## 2014-12-12 LAB — AMYLASE, PLEURAL FLUID: Amylase, Pleural Fluid: 19 U/L

## 2014-12-12 LAB — OSMOLALITY, URINE: OSMOLALITY UR: 757 mosm/kg (ref 390–1090)

## 2014-12-12 LAB — PROTEIN, PERICARDIAL FLUID: Protein, Pericardial Fluid: 5.3 g/dL

## 2014-12-12 MED ORDER — SODIUM CHLORIDE 0.9 % IV SOLN
3.0000 g | Freq: Four times a day (QID) | INTRAVENOUS | Status: DC
  Administered 2014-12-12 – 2014-12-14 (×8): 3 g via INTRAVENOUS
  Filled 2014-12-12 (×10): qty 3

## 2014-12-12 MED ORDER — MORPHINE SULFATE (PF) 2 MG/ML IV SOLN
1.0000 mg | INTRAVENOUS | Status: DC | PRN
  Administered 2014-12-19 – 2014-12-20 (×2): 2 mg via INTRAVENOUS
  Filled 2014-12-12 (×2): qty 1

## 2014-12-12 NOTE — Progress Notes (Signed)
[facility]                                                                              Patient Demographics  Steven Roberts, is a 49 y.o. male, DOB - [birth year], BLT:[id number]  Admit date - 12/10/2014   Admitting Physician Elmarie Shiley, MD  Outpatient Primary MD for the patient is Pcp Not In System  LOS - 2   Chief Complaint  Patient presents with  . Possible biliary obstruction   . Lung Cancer  . Cough  . Abnormal Labs        Brief HPI  Per Dr. Tyrell Antonio due on 12/10/14 Steven Roberts is a 49 y.o. male Hispanic, with recent diagnosis of lung ca 10-2014, metastasis diseases to lung. He has been receiving tarceva and radiation treatments. He went to see his oncologist on the day of admission for regular follow up and was found to have abnormal labs and was refered to [facility] for further evaluation. He was having abdominal pain, epigastric,RU quadrant and supra pubic pain for 1 week. He has had poor oral intake. He reported epigastric pain with meals and early satiety. Denied vomiting, diarrhea. Had small Bowel movement yesterday. He reported Lower extremity edema and abdominal distension for 1 week. He also reported orthopnea and dyspnea on exertion.  On admission, abdominal ultrasound revealed 3 liver masses likely metastatic, cholelithiasis and right pleural effusion. HIDA is pending, and MRCP is negative for biliary dilatation. He was initiated on IV Unasyn, with cultures pending. Surgery was consulted for possible acute cholecystitis. HIDA scan was ordered but could not be completed .Other studies included 2 D echo after massive pericardial effusion was noted on the MRI abdomen. 2-D echo showed large free-flowing pericardial effusion with tamponade physiology. The patient was emergently transferred to [facility] with cardiothoracic consult. Patient underwent subxyphoid pericardial window on 10/28.  Assessment & Plan   Pericardial effusion with tamponade  physiology - MRI of the abdomen noted massive pericardial effusion, 2-D echo showed large free-flowing pericardial effusion with tamponade physiology. - Cardiothoracic surgery was consulted, patient underwent subxyphoid pericardial window on 10/28. - Highly appreciate cardiothoracic surgery assistance, management per [facility]  Transaminases, Hyperbilirubinemia, Abdominal Pain: This could be related to cholelithiasis,cholecystitis vs adverse effect from chemo Tarceva, vs metastasis diseases.  - Continue IV Unasyn - MRCP showed no biliary dilatation. HIDA scan could not be completed.  - Surgery following.  - Hold tarceva, follow LFTs  Acute encephalopathy with blank stare spell - Per documentation, patient apparently had unresponsive episode on 10/28 at [facility] with blank stare, patient did not remember. VS were normal neuro checks were normal and reactive. Possibly patient's effusion was causing the spells that he was having. - EEG ordered -Patient recently had an MRI of the brain on 10/6 which had not shown any evidence of metastatic disease to the brain or meningitis.   Hyponatremia;  Likely related to liver issues or hyperkalemia, SIADH, urine osmolarity 757. Currently on gentle hydration, was NPO this AM. Once patient on diet, will place on saline lock and fluid restriction.    Lower extremity edema;  Could be  related to liver failure, will probably need Lasix if renal function remain stable and no hypotension due to infectious process.   Lung Cancer, metastasis ; Oncology following   Diabetes mellitus: Hyperglycemia SSI.  HBA1c 8, new diagnosis of diabetes.    Code Status: full code  Family Communication: Discussed in detail with the patient, all imaging results, lab results explained to the patient and wife at the bedside  Disposition Plan:   Time Spent in minutes  25 minutes  Procedures  Pericardial window   Consults   Cardiothoracic surgery  Gen. surgery    Oncology   DVT Prophylaxis   SCD's  Medications  Scheduled Meds: . acetaminophen  1,000 mg Oral 4 times per day   Or  . acetaminophen (TYLENOL) oral liquid 160 mg/5 mL  1,000 mg Oral 4 times per day  . docusate sodium  100 mg Oral BID  . insulin aspart  0-9 Units Subcutaneous TID WC  . sodium chloride  3 mL Intravenous Q12H   Continuous Infusions: . 0.9 % NaCl with KCl 20 mEq / L 50 mL/hr at 12/12/14 0800   PRN Meds:.sodium chloride, morphine injection, ondansetron **OR** ondansetron (ZOFRAN) IV, oxyCODONE, potassium chloride, sodium chloride   Antibiotics   Anti-infectives    Start     Dose/Rate Route Frequency Ordered Stop   12/11/14 2100  cefUROXime (ZINACEF) 1.5 g in dextrose 5 % 50 mL IVPB     1.5 g 100 mL/hr over 30 Minutes Intravenous Every 12 hours 12/11/14 2052 12/12/14 0904   12/10/14 2200  Ampicillin-Sulbactam (UNASYN) 3 g in sodium chloride 0.9 % 100 mL IVPB  Status:  Discontinued     3 g 100 mL/hr over 60 Minutes Intravenous Every 6 hours 12/10/14 1820 12/11/14 2052   12/10/14 1715  ampicillin-sulbactam (UNASYN) 1.5 g in sodium chloride 0.9 % 50 mL IVPB     1.5 g 100 mL/hr over 30 Minutes Intravenous  Once 12/10/14 1713 12/10/14 1902        Subjective:   Steven Roberts was seen and examined today.  Still having incisional pain, sitting up in the chair, wife at the bedside. Shortness of breath is improving. Denies any dizziness, nausea or vomiting, afebrile.    Objective:   Blood pressure 106/55, pulse 89, temperature 97.6 F (36.4 C), temperature source Oral, resp. rate 20, height '5\' 8"'$  (1.727 m), weight 98.7 kg (217 lb 9.5 oz), SpO2 90 %.  Wt Readings from Last 3 Encounters:  12/11/14 98.7 kg (217 lb 9.5 oz)  12/10/14 74.753 kg (164 lb 12.8 oz)  12/10/14 74.39 kg (164 lb)     Intake/Output Summary (Last 24 hours) at 12/12/14 1031 Last data filed at 12/12/14 0834  Gross per 24 hour  Intake 961.67 ml  Output   4275 ml  Net -3313.33 ml     Exam  General: Alert and oriented x 3, NAD  HEENT:  PERRLA, EOMI, Anicteric Sclera, mucous membranes moist.   Neck: Supple, no JVD, no masses  CVS: S1 S2 auscultated, no rubs, murmurs or gallops. Regular rate and rhythm.  Respiratory:Decreased breath sounds at the bases   Abdomen: Soft, nontender, nondistended, + bowel sounds  Ext: no cyanosis clubbing, 1-2 + edema  Neuro: AAOx3, Cr N's II- XII. Strength 5/5 upper and lower extremities bilaterally  Skin: No rashes  Psych: Normal affect and demeanor, alert and oriented x3    Data Review   Micro Results Recent Results (from the past 240 hour(s))  MRSA PCR Screening  Status: None   Collection Time: 12/11/14  8:44 AM  Result Value Ref Range Status   MRSA by PCR NEGATIVE NEGATIVE Final    Comment:        The GeneXpert MRSA Assay (FDA approved for NASAL specimens only), is one component of a comprehensive MRSA colonization surveillance program. It is not intended to diagnose MRSA infection nor to guide or monitor treatment for MRSA infections.   Gram stain     Status: None   Collection Time: 12/11/14 12:54 PM  Result Value Ref Range Status   Specimen Description FLUID PERICARDIAL  Final   Special Requests NONE  Final   Gram Stain   Final    FEW WBC PRESENT,BOTH PMN AND MONONUCLEAR NO ORGANISMS SEEN    Report Status 12/11/2014 FINAL  Final    Radiology Reports Dg Chest 2 View  12/10/2014  CLINICAL DATA:  Cough and shortness of breath. Current diagnosis of lung cancer. EXAM: CHEST  2 VIEW COMPARISON:  PET-CT dated 11/03/2014 FINDINGS: Cardiomediastinal silhouette is enlarged. Mediastinal contours is obscured by a large left perihilar mass. There are bilateral pleural effusions with associated subsegmental bibasilar atelectasis. Osseous structures are without acute abnormality. Soft tissues are grossly normal. IMPRESSION: Large left perihilar mass, consistent with known primary lung malignancy. Bilateral  pleural effusions, moderate in size. Enlarged cardiac silhouette. Pericardial effusion cannot be excluded. Electronically Signed   By: Fidela Salisbury M.D.   On: 12/10/2014 15:21   Dg Abd 1 View  12/02/2014  ADDENDUM REPORT: 12/02/2014 16:24 ADDENDUM: Not mentioned above is small-moderate amount of stool in the ascending colon. No evidence of rectal fecal impaction. Electronically Signed   By: Kathreen Devoid   On: 12/02/2014 16:24  12/02/2014  CLINICAL DATA:  The mid and low abdominal pain EXAM: ABDOMEN - 1 VIEW COMPARISON:  PET-CT 11/03/2014 FINDINGS: The bowel gas pattern is normal. No radio-opaque calculi or other significant radiographic abnormality are seen. IMPRESSION: Negative. Electronically Signed: By: Kathreen Devoid On: 12/02/2014 15:39   Ct Head Wo Contrast  12/11/2014  CLINICAL DATA:  Rolled out of bed and hit right upper posterior head. Initial encounter. EXAM: CT HEAD WITHOUT CONTRAST TECHNIQUE: Contiguous axial images were obtained from the base of the skull through the vertex without intravenous contrast. COMPARISON:  MRI of the brain performed 11/19/2014 FINDINGS: There is no evidence of acute infarction, mass lesion, or intra- or extra-axial hemorrhage on CT. The posterior fossa, including the cerebellum, brainstem and fourth ventricle, is within normal limits. The third and lateral ventricles, and basal ganglia are unremarkable in appearance. The cerebral hemispheres are symmetric in appearance, with normal gray-white differentiation. No mass effect or midline shift is seen. There is no evidence of fracture; visualized osseous structures are unremarkable in appearance. The orbits are within normal limits. The paranasal sinuses and mastoid air cells are well-aerated. No significant soft tissue abnormalities are seen. IMPRESSION: No evidence of traumatic intracranial injury or fracture Electronically Signed   By: Garald Balding M.D.   On: 12/11/2014 05:23   Mr Jeri Cos YW  Contrast  11/19/2014  CLINICAL DATA:  Stage IV non-small cell lung cancer. Left-sided headaches for 3 months. EXAM: MRI HEAD WITHOUT AND WITH CONTRAST TECHNIQUE: Multiplanar, multiecho pulse sequences of the brain and surrounding structures were obtained without and with intravenous contrast. CONTRAST:  17 mL MultiHance. COMPARISON:  None. FINDINGS: No acute infarct, hemorrhage, or mass lesion is present. The ventricles are of normal size. No significant extraaxial fluid collection is present. No  significant white matter changes are present. The internal auditory canals and brainstem are normal. Flow is present in the major intracranial arteries. The globes orbits are intact. Mild mucosal thickening is present in the maxillary sinuses and ethmoid air cells bilaterally. There is minimal mucosal thickening in the right frontal sinus. The sphenoid sinuses and mastoid air cells are clear. Skullbase is within normal limits.  Midline images are unremarkable. Postcontrast images demonstrate no pathologic enhancement to suggest metastatic disease the brain or meninges. Linear enhancement in the right temporal lobe is compatible with a developmental venous anomaly. IMPRESSION: 1. No evidence for metastatic disease to the brain or meninges. 2. 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 3. Minimal sinus disease. Electronically Signed   By: San Morelle M.D.   On: 11/19/2014 09:03   Nm Hepatobiliary Liver Func  12/11/2014  CLINICAL DATA:  Right upper quadrant abdominal pain, nausea, vomiting. EXAM: NUCLEAR MEDICINE HEPATOBILIARY IMAGING TECHNIQUE: Sequential images of the abdomen were obtained out to 60 minutes following intravenous administration of radiopharmaceutical. RADIOPHARMACEUTICALS:  7.4 mCi Tc-71m Choletec IV COMPARISON:  MRI of December 10, 2014. FINDINGS: Normal uptake within hepatic parenchyma is noted. Imaging was only carried out to 40 minutes after isotope administration as  the patient had to go to emergency heart surgery. Filling of the duodenum is noted, but no definite gallbladder filling is noted. IMPRESSION: Exam was stopped at 40 minutes after radiotracer administration as patient had to go to surgery. No definite filling of the gallbladder is noted on the available images, but given that the exam did not go 1 hour, this exam is nondiagnostic for the evaluation for cholecystitis. Electronically Signed   By: JMarijo Conception M.D.   On: 12/11/2014 14:53   Mr Abdomen Mrcp Wo Cm  12/11/2014  CLINICAL DATA:  Abdominal pain. Left upper lobe adenocarcinoma, metastatic. EXAM: MRI ABDOMEN WITHOUT CONTRAST  (INCLUDING MRCP) TECHNIQUE: Multiplanar multisequence MR imaging of the abdomen was performed. Heavily T2-weighted images of the biliary and pancreatic ducts were obtained, and three-dimensional MRCP images were rendered by post processing. COMPARISON:  11/03/2014 ; 12/10/2014 FINDINGS: Despite efforts by the technologist and patient, motion artifact is present on today's exam and could not be eliminated. This reduces exam sensitivity and specificity. Lower chest: Massive pericardial effusion, new compared to 11/03/14. Considerable bilateral pleural effusions. Hepatobiliary: Multiple T2 hyperintense liver lesions are present. This includes a 1.9 by 1.3 cm lesion in segment 2; a 2.8 by 1.8 cm lesion in segment 3; a 3.0 by 2.4 cm lesion posteriorly in segment 4A near the IVC ; and several other smaller right hepatic lobe lesions. Periportal edema is present. This is nonspecific. Attempts at at diagnostic MRCP images worth awarded by motion artifact. There is likely some gallbladder wall thickening and suspected gallstones. No definite biliary dilatation. Pancreas: Grossly unremarkable. Spleen: Unremarkable Adrenals/Urinary Tract: Unremarkable Stomach/Bowel: Unremarkable Vascular/Lymphatic: Unremarkable Other: Upper abdominal ascites especially tracking around the spleen and in the  paracolic gutters. Musculoskeletal: Unremarkable where visualized. IMPRESSION: 1. Multiple hepatic lesions measuring up to 3 cm in long axis are T2 hyperintense. On sonography these appeared echogenic. On outside CT angiogram of the chest dated 10/13/2014 there was some arterial phase enhancement associated with some of these lesions. On the nuclear medicine PET-CT from 081/85/6314 no hypermetabolic liver lesion is observed. Accordingly, the possibility is certainly raised that these lesions represent hemangiomas rather than necessarily being due to metastatic disease. Unfortunately IV contrast could not be administered to confirm this. As  part of future staging workups when the patient is less acutely ill, contrast-enhanced hepatic protocol CT or MRI would be suggested. 2. Massive pericardial effusion, new compared to 11/03/2014. Correlation with Beck's triad or other signs of tamponade recommended. 3. Bilateral pleural effusions and ascites. 4. Thick-walled gallbladder, nonspecific, but cholecystitis is not excluded. Cholelithiasis. No definite biliary dilatation ; MRCP sequences or on feasible due to motion artifact. 5. Nonspecific periportal edema. These results will be called to the ordering clinician or representative by the Radiologist Assistant, and communication documented in the PACS or zVision Dashboard. Electronically Signed   By: Van Clines M.D.   On: 12/11/2014 07:53   Mr 3d Recon At Scanner  12/11/2014  CLINICAL DATA:  Abdominal pain. Left upper lobe adenocarcinoma, metastatic. EXAM: MRI ABDOMEN WITHOUT CONTRAST  (INCLUDING MRCP) TECHNIQUE: Multiplanar multisequence MR imaging of the abdomen was performed. Heavily T2-weighted images of the biliary and pancreatic ducts were obtained, and three-dimensional MRCP images were rendered by post processing. COMPARISON:  11/03/2014 ; 12/10/2014 FINDINGS: Despite efforts by the technologist and patient, motion artifact is present on today's exam and  could not be eliminated. This reduces exam sensitivity and specificity. Lower chest: Massive pericardial effusion, new compared to 11/03/14. Considerable bilateral pleural effusions. Hepatobiliary: Multiple T2 hyperintense liver lesions are present. This includes a 1.9 by 1.3 cm lesion in segment 2; a 2.8 by 1.8 cm lesion in segment 3; a 3.0 by 2.4 cm lesion posteriorly in segment 4A near the IVC ; and several other smaller right hepatic lobe lesions. Periportal edema is present. This is nonspecific. Attempts at at diagnostic MRCP images worth awarded by motion artifact. There is likely some gallbladder wall thickening and suspected gallstones. No definite biliary dilatation. Pancreas: Grossly unremarkable. Spleen: Unremarkable Adrenals/Urinary Tract: Unremarkable Stomach/Bowel: Unremarkable Vascular/Lymphatic: Unremarkable Other: Upper abdominal ascites especially tracking around the spleen and in the paracolic gutters. Musculoskeletal: Unremarkable where visualized. IMPRESSION: 1. Multiple hepatic lesions measuring up to 3 cm in long axis are T2 hyperintense. On sonography these appeared echogenic. On outside CT angiogram of the chest dated 10/13/2014 there was some arterial phase enhancement associated with some of these lesions. On the nuclear medicine PET-CT from 16/02/930, no hypermetabolic liver lesion is observed. Accordingly, the possibility is certainly raised that these lesions represent hemangiomas rather than necessarily being due to metastatic disease. Unfortunately IV contrast could not be administered to confirm this. As part of future staging workups when the patient is less acutely ill, contrast-enhanced hepatic protocol CT or MRI would be suggested. 2. Massive pericardial effusion, new compared to 11/03/2014. Correlation with Beck's triad or other signs of tamponade recommended. 3. Bilateral pleural effusions and ascites. 4. Thick-walled gallbladder, nonspecific, but cholecystitis is not excluded.  Cholelithiasis. No definite biliary dilatation ; MRCP sequences or on feasible due to motion artifact. 5. Nonspecific periportal edema. These results will be called to the ordering clinician or representative by the Radiologist Assistant, and communication documented in the PACS or zVision Dashboard. Electronically Signed   By: Van Clines M.D.   On: 12/11/2014 07:53   US Abdomen Limited  12/10/2014  CLINICAL DATA:  Evaluate for cholecystitis/cause of hepatitis EXAM: US ABDOMEN LIMITED - RIGHT UPPER QUADRANT COMPARISON:  None. FINDINGS: Gallbladder: Multiple gallstones within the partially full gallbladder. The wall is circumferentially thickened to 6 mm. No pericholecystic edema. Sonographer reports focal tenderness. Common bile duct: Diameter: 2 mm Liver: Three echogenic masses within the liver, ranging between 16 and 24 mm in maximal diameter, without hypoechoic halo. Antegrade  flow in the imaged portal venous system. Right pleural effusion, small by chest radiography. IMPRESSION: 1. At least 3 liver masses, primarily concerning for lung cancer metastases. 2. Cholelithiasis with conflicting findings for acute cholecystitis. There is gallbladder wall thickening and focal tenderness, but the gallbladder is not dilated as usually seen with cystic duct obstruction and acute cholecystitis. 3. Right pleural effusion. Electronically Signed   By: Monte Fantasia M.D.   On: 12/10/2014 16:12   Dg Chest Port 1 View  12/11/2014  CLINICAL DATA:  Atelectasis EXAM: PORTABLE CHEST 1 VIEW COMPARISON:  12/10/2014 FINDINGS: Marked cardiopericardial enlargement. There is hazy opacity over the bilateral chest compatible with layering pleural effusions. New placement of drains over the chest post subxiphoid window. Hazy mass in the left upper chest in this patient with known adenocarcinoma. IMPRESSION: 1. Decreased cardiopericardial enlargement after pericardial effusion drainage/drain placement. 2. Lower lung volumes  with bilateral layering pleural effusion. 3. Extensive left upper lobe mass. Electronically Signed   By: Monte Fantasia M.D.   On: 12/11/2014 14:23    CBC  Recent Labs Lab 12/10/14 1017 12/10/14 1508 12/11/14 0537 12/12/14 0546  WBC 13.3* 13.9* 12.7* 10.8*  HGB 10.8* 11.1* 10.5* 10.3*  HCT 33.1* 33.7* 32.3* 31.2*  PLT 532* 546* 516* 401*  MCV 85.3 84.9 85.9 85.7  MCH 27.8 28.0 27.9 28.3  MCHC 32.6 32.9 32.5 33.0  RDW 16.1* 15.8* 16.2* 16.3*  LYMPHSABS 0.8* 1.0  --   --   MONOABS 1.1* 1.3*  --   --   EOSABS 0.0 0.0  --   --   BASOSABS 0.0 0.0  --   --     Chemistries   Recent Labs Lab 12/10/14 1017 12/10/14 1508 12/11/14 0537 12/12/14 0546  NA 127* 126* 129* 127*  K 4.8 4.6 4.3 4.5  CL  --  93* 97* 96*  CO2 23 23 21* 26  GLUCOSE 289* 231* 222* 212*  BUN 41.3* 42* 37* 23*  CREATININE 1.2 1.11 1.04 0.84  CALCIUM 8.9 8.7* 8.7* 8.2*  AST 179 Repeated and Verified* 169* 117*  --   ALT 540 Repeated and Verified* 505* 422*  --   ALKPHOS 227* 218* 192*  --   BILITOT 3.13* 3.3* 3.2*  --    ------------------------------------------------------------------------------------------------------------------ estimated creatinine clearance is 123.8 mL/min (by C-G formula based on Cr of 0.84). ------------------------------------------------------------------------------------------------------------------  Recent Labs  12/10/14 1508  HGBA1C 8.0*   ------------------------------------------------------------------------------------------------------------------ No results for input(s): CHOL, HDL, LDLCALC, TRIG, CHOLHDL, LDLDIRECT in the last 72 hours. ------------------------------------------------------------------------------------------------------------------ No results for input(s): TSH, T4TOTAL, T3FREE, THYROIDAB in the last 72 hours.  Invalid input(s):  FREET3 ------------------------------------------------------------------------------------------------------------------ No results for input(s): VITAMINB12, FOLATE, FERRITIN, TIBC, IRON, RETICCTPCT in the last 72 hours.  Coagulation profile  Recent Labs Lab 12/10/14 1508  INR 1.46    No results for input(s): DDIMER in the last 72 hours.  Cardiac Enzymes No results for input(s): CKMB, TROPONINI, MYOGLOBIN in the last 168 hours.  Invalid input(s): CK ------------------------------------------------------------------------------------------------------------------ Invalid input(s): [place]  12/10/14 2206 12/11/14 0754 12/11/14 2137 12/12/14 0808  GLUCAP 203* 211* 238* 173*     RAI,RIPUDEEP M.D. [facility] 12/12/2014, 10:31 AM  Pager: [phone] Between 7am to 7pm - call Pager - [phone]  After 7pm go to [email or web address] - password [email or web address]  Call night coverage person covering after 7pm

## 2014-12-12 NOTE — Care Management Note (Signed)
Case Management Note  Patient Details  Name: Steven Roberts MRN: [id number] Date of Birth: 04-26-1965  Subjective/Objective:  Admitted with Pericardial effusion with tamponade, s/p percardial window 12/11/14. Recently diagnosed with  lung CA . He has been receiving tarceva and radiation treatments .Lives with wife, Steven Roberts. Independent with ADL's. No DME usage.   Action/Plan: Return to home when medically stable.CM to f/u with d/c disposition needs.  Expected Discharge Date:   (unknown)               Expected Discharge Plan:     In-House Referral:     Discharge planning Services     Post Acute Care Choice:    Choice offered to:     DME Arranged:    DME Agency:     HH Arranged:    [place] Agency:     Status of Service:     Medicare Important Message Given:    Date Medicare IM Given:    Medicare IM give by:    Date Additional Medicare IM Given:    Additional Medicare Important Message give by:     If discussed at [place] of Stay Meetings, dates discussed:    Additional Comments: CM spoke with pt regarding no PCP and medical insurance. CM offered [facility] to establish PCP, pt stated he is interested [facility] services. CM to f/u with [facility] on Monday to schedule appt.(clinic closed/ weekends). Also CM to f/u with financial counselor regarding pt with no medical insurance, pt states awaiting determination for medicaid approval, application in process.  Steven Roberts (Spouse)  [phone]      Sharin Mons, RN 12/12/2014, 4:03 PM

## 2014-12-12 NOTE — Progress Notes (Signed)
1 Day Post-Op  Subjective: Patient underwent pericardial window yesterday - feeling much better No abdominal pain, except at incision  Objective: Vital signs in last 24 hours: Temp:  [97 F (36.1 C)-97.9 F (36.6 C)] 97.9 F (36.6 C) (10/29 1132) Pulse Rate:  [78-95] 95 (10/29 1132) Resp:  [9-20] 20 (10/29 1132) BP: (99-124)/(55-72) 109/60 mmHg (10/29 1132) SpO2:  [90 %-99 %] 94 % (10/29 1132) Arterial Line BP: (90-148)/(46-63) 138/56 mmHg (10/28 2020) Weight:  [98.7 kg (217 lb 9.5 oz)] 98.7 kg (217 lb 9.5 oz) (10/28 2020) Last BM Date: 12/10/14  Intake/Output from previous day: 10/28 0701 - 10/29 0700 In: 811.7 [P.O.:120; I.V.:641.7; IV Piggyback:50] Out: 1443 [XVQMG:8676; Chest Tube:1150] Intake/Output this shift: Total I/O In: 730 [P.O.:480; I.V.:100; IV Piggyback:150] Out: 50 [Chest Tube:50]  GI: soft, tender only at incision; no RUQ tenderness  Lab Results:   Recent Labs  12/11/14 0537 12/12/14 0546  WBC 12.7* 10.8*  HGB 10.5* 10.3*  HCT 32.3* 31.2*  PLT 516* 401*   BMET  Recent Labs  12/11/14 0537 12/12/14 0546  NA 129* 127*  K 4.3 4.5  CL 97* 96*  CO2 21* 26  GLUCOSE 222* 212*  BUN 37* 23*  CREATININE 1.04 0.84  CALCIUM 8.7* 8.2*   PT/INR  Recent Labs  12/10/14 1508  LABPROT 17.9*  INR 1.46   ABG  Recent Labs  12/12/14 0529  PHART 7.461*  HCO3 20.6    Studies/Results: Dg Chest 2 View  12/10/2014  CLINICAL DATA:  Cough and shortness of breath. Current diagnosis of lung cancer. EXAM: CHEST  2 VIEW COMPARISON:  PET-CT dated 11/03/2014 FINDINGS: Cardiomediastinal silhouette is enlarged. Mediastinal contours is obscured by a large left perihilar mass. There are bilateral pleural effusions with associated subsegmental bibasilar atelectasis. Osseous structures are without acute abnormality. Soft tissues are grossly normal. IMPRESSION: Large left perihilar mass, consistent with known primary lung malignancy. Bilateral pleural effusions,  moderate in size. Enlarged cardiac silhouette. Pericardial effusion cannot be excluded. Electronically Signed   By: Fidela Salisbury M.D.   On: 12/10/2014 15:21   Ct Head Wo Contrast  12/11/2014  CLINICAL DATA:  Rolled out of bed and hit right upper posterior head. Initial encounter. EXAM: CT HEAD WITHOUT CONTRAST TECHNIQUE: Contiguous axial images were obtained from the base of the skull through the vertex without intravenous contrast. COMPARISON:  MRI of the brain performed 11/19/2014 FINDINGS: There is no evidence of acute infarction, mass lesion, or intra- or extra-axial hemorrhage on CT. The posterior fossa, including the cerebellum, brainstem and fourth ventricle, is within normal limits. The third and lateral ventricles, and basal ganglia are unremarkable in appearance. The cerebral hemispheres are symmetric in appearance, with normal gray-white differentiation. No mass effect or midline shift is seen. There is no evidence of fracture; visualized osseous structures are unremarkable in appearance. The orbits are within normal limits. The paranasal sinuses and mastoid air cells are well-aerated. No significant soft tissue abnormalities are seen. IMPRESSION: No evidence of traumatic intracranial injury or fracture Electronically Signed   By: Garald Balding M.D.   On: 12/11/2014 05:23   Nm Hepatobiliary Liver Func  12/11/2014  CLINICAL DATA:  Right upper quadrant abdominal pain, nausea, vomiting. EXAM: NUCLEAR MEDICINE HEPATOBILIARY IMAGING TECHNIQUE: Sequential images of the abdomen were obtained out to 60 minutes following intravenous administration of radiopharmaceutical. RADIOPHARMACEUTICALS:  7.4 mCi Tc-73m Choletec IV COMPARISON:  MRI of December 10, 2014. FINDINGS: Normal uptake within hepatic parenchyma is noted. Imaging was only carried out  to 40 minutes after isotope administration as the patient had to go to emergency heart surgery. Filling of the duodenum is noted, but no definite  gallbladder filling is noted. IMPRESSION: Exam was stopped at 40 minutes after radiotracer administration as patient had to go to surgery. No definite filling of the gallbladder is noted on the available images, but given that the exam did not go 1 hour, this exam is nondiagnostic for the evaluation for cholecystitis. Electronically Signed   By: Marijo Conception, M.D.   On: 12/11/2014 14:53   Mr Abdomen Mrcp Wo Cm  12/11/2014  CLINICAL DATA:  Abdominal pain. Left upper lobe adenocarcinoma, metastatic. EXAM: MRI ABDOMEN WITHOUT CONTRAST  (INCLUDING MRCP) TECHNIQUE: Multiplanar multisequence MR imaging of the abdomen was performed. Heavily T2-weighted images of the biliary and pancreatic ducts were obtained, and three-dimensional MRCP images were rendered by post processing. COMPARISON:  11/03/2014 ; 12/10/2014 FINDINGS: Despite efforts by the technologist and patient, motion artifact is present on today's exam and could not be eliminated. This reduces exam sensitivity and specificity. Lower chest: Massive pericardial effusion, new compared to 11/03/14. Considerable bilateral pleural effusions. Hepatobiliary: Multiple T2 hyperintense liver lesions are present. This includes a 1.9 by 1.3 cm lesion in segment 2; a 2.8 by 1.8 cm lesion in segment 3; a 3.0 by 2.4 cm lesion posteriorly in segment 4A near the IVC ; and several other smaller right hepatic lobe lesions. Periportal edema is present. This is nonspecific. Attempts at at diagnostic MRCP images worth awarded by motion artifact. There is likely some gallbladder wall thickening and suspected gallstones. No definite biliary dilatation. Pancreas: Grossly unremarkable. Spleen: Unremarkable Adrenals/Urinary Tract: Unremarkable Stomach/Bowel: Unremarkable Vascular/Lymphatic: Unremarkable Other: Upper abdominal ascites especially tracking around the spleen and in the paracolic gutters. Musculoskeletal: Unremarkable where visualized. IMPRESSION: 1. Multiple hepatic  lesions measuring up to 3 cm in long axis are T2 hyperintense. On sonography these appeared echogenic. On outside CT angiogram of the chest dated 10/13/2014 there was some arterial phase enhancement associated with some of these lesions. On the nuclear medicine PET-CT from 97/67/3419, no hypermetabolic liver lesion is observed. Accordingly, the possibility is certainly raised that these lesions represent hemangiomas rather than necessarily being due to metastatic disease. Unfortunately IV contrast could not be administered to confirm this. As part of future staging workups when the patient is less acutely ill, contrast-enhanced hepatic protocol CT or MRI would be suggested. 2. Massive pericardial effusion, new compared to 11/03/2014. Correlation with Beck's triad or other signs of tamponade recommended. 3. Bilateral pleural effusions and ascites. 4. Thick-walled gallbladder, nonspecific, but cholecystitis is not excluded. Cholelithiasis. No definite biliary dilatation ; MRCP sequences or on feasible due to motion artifact. 5. Nonspecific periportal edema. These results will be called to the ordering clinician or representative by the Radiologist Assistant, and communication documented in the PACS or zVision Dashboard. Electronically Signed   By: Van Clines M.D.   On: 12/11/2014 07:53   Mr 3d Recon At Scanner  12/11/2014  CLINICAL DATA:  Abdominal pain. Left upper lobe adenocarcinoma, metastatic. EXAM: MRI ABDOMEN WITHOUT CONTRAST  (INCLUDING MRCP) TECHNIQUE: Multiplanar multisequence MR imaging of the abdomen was performed. Heavily T2-weighted images of the biliary and pancreatic ducts were obtained, and three-dimensional MRCP images were rendered by post processing. COMPARISON:  11/03/2014 ; 12/10/2014 FINDINGS: Despite efforts by the technologist and patient, motion artifact is present on today's exam and could not be eliminated. This reduces exam sensitivity and specificity. Lower chest: Massive  pericardial effusion, new  compared to 11/03/14. Considerable bilateral pleural effusions. Hepatobiliary: Multiple T2 hyperintense liver lesions are present. This includes a 1.9 by 1.3 cm lesion in segment 2; a 2.8 by 1.8 cm lesion in segment 3; a 3.0 by 2.4 cm lesion posteriorly in segment 4A near the IVC ; and several other smaller right hepatic lobe lesions. Periportal edema is present. This is nonspecific. Attempts at at diagnostic MRCP images worth awarded by motion artifact. There is likely some gallbladder wall thickening and suspected gallstones. No definite biliary dilatation. Pancreas: Grossly unremarkable. Spleen: Unremarkable Adrenals/Urinary Tract: Unremarkable Stomach/Bowel: Unremarkable Vascular/Lymphatic: Unremarkable Other: Upper abdominal ascites especially tracking around the spleen and in the paracolic gutters. Musculoskeletal: Unremarkable where visualized. IMPRESSION: 1. Multiple hepatic lesions measuring up to 3 cm in long axis are T2 hyperintense. On sonography these appeared echogenic. On outside CT angiogram of the chest dated 10/13/2014 there was some arterial phase enhancement associated with some of these lesions. On the nuclear medicine PET-CT from 71/69/6789, no hypermetabolic liver lesion is observed. Accordingly, the possibility is certainly raised that these lesions represent hemangiomas rather than necessarily being due to metastatic disease. Unfortunately IV contrast could not be administered to confirm this. As part of future staging workups when the patient is less acutely ill, contrast-enhanced hepatic protocol CT or MRI would be suggested. 2. Massive pericardial effusion, new compared to 11/03/2014. Correlation with Beck's triad or other signs of tamponade recommended. 3. Bilateral pleural effusions and ascites. 4. Thick-walled gallbladder, nonspecific, but cholecystitis is not excluded. Cholelithiasis. No definite biliary dilatation ; MRCP sequences or on feasible due to motion  artifact. 5. Nonspecific periportal edema. These results will be called to the ordering clinician or representative by the Radiologist Assistant, and communication documented in the PACS or zVision Dashboard. Electronically Signed   By: Van Clines M.D.   On: 12/11/2014 07:53   US Abdomen Limited  12/10/2014  CLINICAL DATA:  Evaluate for cholecystitis/cause of hepatitis EXAM: US ABDOMEN LIMITED - RIGHT UPPER QUADRANT COMPARISON:  None. FINDINGS: Gallbladder: Multiple gallstones within the partially full gallbladder. The wall is circumferentially thickened to 6 mm. No pericholecystic edema. Sonographer reports focal tenderness. Common bile duct: Diameter: 2 mm Liver: Three echogenic masses within the liver, ranging between 16 and 24 mm in maximal diameter, without hypoechoic halo. Antegrade flow in the imaged portal venous system. Right pleural effusion, small by chest radiography. IMPRESSION: 1. At least 3 liver masses, primarily concerning for lung cancer metastases. 2. Cholelithiasis with conflicting findings for acute cholecystitis. There is gallbladder wall thickening and focal tenderness, but the gallbladder is not dilated as usually seen with cystic duct obstruction and acute cholecystitis. 3. Right pleural effusion. Electronically Signed   By: Monte Fantasia M.D.   On: 12/10/2014 16:12   Dg Chest Port 1 View  12/12/2014  CLINICAL DATA:  Pneumothorax, chest tube present. History of pericardial effusion with cardiac tamponade. Cancer. EXAM: PORTABLE CHEST 1 VIEW COMPARISON:  12/11/2014 FINDINGS: Low lung volumes. Left chest tube.  No pneumothorax is seen. Known left upper lobe mass with lymphangitic spread in the left upper lobe. Superimposed right perihilar edema and/or infection. Additional patchy opacity in the right lower lobe, atelectasis versus pneumonia. Suspected small bilateral pleural effusions. Cardiomegaly. IMPRESSION: Left chest tube.  No pneumothorax is seen. Known left upper lobe  mass with lymphangitic spread in the left upper lobe. Superimposed right perihilar edema and/or infection. Additional patchy right lower lobe opacity, atelectasis versus pneumonia. Small bilateral pleural effusions. Electronically Signed   By: Bertis Ruddy  [place] Pink M.D.   On: 12/12/2014 12:24   Dg Chest Port 1 View  12/11/2014  CLINICAL DATA:  Atelectasis EXAM: PORTABLE CHEST 1 VIEW COMPARISON:  12/10/2014 FINDINGS: Marked cardiopericardial enlargement. There is hazy opacity over the bilateral chest compatible with layering pleural effusions. New placement of drains over the chest post subxiphoid window. Hazy mass in the left upper chest in this patient with known adenocarcinoma. IMPRESSION: 1. Decreased cardiopericardial enlargement after pericardial effusion drainage/drain placement. 2. Lower lung volumes with bilateral layering pleural effusion. 3. Extensive left upper lobe mass. Electronically Signed   By: Monte Fantasia M.D.   On: 12/11/2014 14:23    Anti-infectives: Anti-infectives    Start     Dose/Rate Route Frequency Ordered Stop   12/12/14 1200  Ampicillin-Sulbactam (UNASYN) 3 g in sodium chloride 0.9 % 100 mL IVPB     3 g 100 mL/hr over 60 Minutes Intravenous Every 6 hours 12/12/14 1119     12/11/14 2100  cefUROXime (ZINACEF) 1.5 g in dextrose 5 % 50 mL IVPB     1.5 g 100 mL/hr over 30 Minutes Intravenous Every 12 hours 12/11/14 2052 12/12/14 0904   12/10/14 2200  Ampicillin-Sulbactam (UNASYN) 3 g in sodium chloride 0.9 % 100 mL IVPB  Status:  Discontinued     3 g 100 mL/hr over 60 Minutes Intravenous Every 6 hours 12/10/14 1820 12/11/14 2052   12/10/14 1715  ampicillin-sulbactam (UNASYN) 1.5 g in sodium chloride 0.9 % 50 mL IVPB     1.5 g 100 mL/hr over 30 Minutes Intravenous  Once 12/10/14 1713 12/10/14 1902      Assessment/Plan: s/p Procedure(s): SUBXYPHOID PERICARDIAL WINDOW (N/A) TRANSESOPHAGEAL ECHOCARDIOGRAM (TEE) (N/A) No indications of acute cholecystitis   Significant  medical comorbidities - would not pursue further gallbladder work-up unless symptoms return.  Will sign off for now.  Call us if needed.   LOS: 2 days    Wane Mollett K. 12/12/2014

## 2014-12-12 NOTE — Progress Notes (Signed)
ANTIBIOTIC CONSULT NOTE - INITIAL  Pharmacy Consult for Unasyn Indication: Intra-abdominal Infection   Allergies  Allergen Reactions  . Hydrocodone-Acetaminophen Nausea Only and Other (See Comments)    Numbness     Patient Measurements: Height: '5\' 8"'$  (172.7 cm) Weight: 217 lb 9.5 oz (98.7 kg) IBW/kg (Calculated) : 68.4  Vital Signs: Temp: 97.6 F (36.4 C) (10/29 0755) Temp Source: Oral (10/29 0755) BP: 106/55 mmHg (10/29 0755) Pulse Rate: 89 (10/29 0755) Intake/Output from previous day: 10/28 0701 - 10/29 0700 In: 811.7 [P.O.:120; I.V.:641.7; IV Piggyback:50] Out: 5643 [PIRJJ:8841; Chest Tube:1150] Intake/Output from this shift: Total I/O In: 150 [I.V.:100; IV Piggyback:50] Out: 50 [Chest Tube:50]  Labs:  Recent Labs  12/10/14 1508 12/10/14 1749 12/11/14 0537 12/12/14 0546  WBC 13.9*  --  12.7* 10.8*  HGB 11.1*  --  10.5* 10.3*  PLT 546*  --  516* 401*  LABCREA  --  167.68  --   --   CREATININE 1.11  --  1.04 0.84   Estimated Creatinine Clearance: 123.8 mL/min (by C-G formula based on Cr of 0.84). No results for input(s): VANCOTROUGH, VANCOPEAK, VANCORANDOM, GENTTROUGH, GENTPEAK, GENTRANDOM, TOBRATROUGH, TOBRAPEAK, TOBRARND, AMIKACINPEAK, AMIKACINTROU, AMIKACIN in the last 72 hours.   Microbiology: Recent Results (from the past 720 hour(s))  MRSA PCR Screening     Status: None   Collection Time: 12/11/14  8:44 AM  Result Value Ref Range Status   MRSA by PCR NEGATIVE NEGATIVE Final    Comment:        The GeneXpert MRSA Assay (FDA approved for NASAL specimens only), is one component of a comprehensive MRSA colonization surveillance program. It is not intended to diagnose MRSA infection nor to guide or monitor treatment for MRSA infections.   Gram stain     Status: None   Collection Time: 12/11/14 12:54 PM  Result Value Ref Range Status   Specimen Description FLUID PERICARDIAL  Final   Special Requests NONE  Final   Gram Stain   Final    FEW WBC  PRESENT,BOTH PMN AND MONONUCLEAR NO ORGANISMS SEEN    Report Status 12/11/2014 FINAL  Final    Medical History: Past Medical History  Diagnosis Date  . Bone cancer ([place]) 11/03/14 Pet scan    left 6th rib, right hip  . Lung cancer ([place]) 10/23/14    LUL adenocarcinoma, non-small cell  . Pericardial effusion with cardiac tamponade 12/11/2014  . Pulmonary infiltrates  c/w Adenoca/ Stage IV  10/13/2014    - symptom onset march 2016 - Baseline cxr on July 20 2014 reported to be nl  > then CT chest 10/13/2014 LUL as dz  Assoc with   LUL nodular density/ mild adenopathy - Quantiferon Gold 10/13/2014 > neg  - FOB 10/23/14 > cobblestoning beyond Lingular orifice with diffuse narrowing x 50% of LUL beyond lingula> adenoca - PET 11/03/14 1. The dominant left upper lobe mass and extensive adenopathy involving th  . Non-small cell carcinoma of lung, stage 4 ([place]) 11/12/2014  . Hypoalbuminemia due to protein-calorie malnutrition ([place]) 12/02/2014  . Hyponatremia 12/10/2014  . Hyperphosphatemia 12/02/2014  . Bone metastasis ([place]) 11/25/2014    Medications:  Scheduled:  . acetaminophen  1,000 mg Oral 4 times per day   Or  . acetaminophen (TYLENOL) oral liquid 160 mg/5 mL  1,000 mg Oral 4 times per day  . ampicillin-sulbactam (UNASYN) IV  3 g Intravenous Q6H  . docusate sodium  100 mg Oral BID  . insulin aspart  0-9 Units Subcutaneous  TID WC  . sodium chloride  3 mL Intravenous Q12H   Assessment: 31 yoM with recent diagnosis of lung ca 10-2014, metastasis diseases to lung. He has been receiving tarceva and radiation treatments. Admitted to [facility] and found to have liver masses likely metastatic, cholelithiasis and right pleural effusion. Pt was started on Unasyn at [facility]. The patient was emergently transferred to [facility] with cardiothoracic consult. Pharmacy consulted to resume Unasyn.   Goal of Therapy:  Irradiation of infection   Plan:  Resume Unasyn 3G q 6hr Will continue to follow renal function, culture  results, LOT, and antibiotic de-escalation plans   Darl Pikes, PharmD Clinical Pharmacist- Resident Pager: [phone]  Darl Pikes 12/12/2014,11:13 AM

## 2014-12-12 NOTE — Progress Notes (Addendum)
      Steven Roberts       [place] [address]             [phone]        1 Day Post-Op Procedure(s) (LRB): SUBXYPHOID PERICARDIAL WINDOW (N/A) TRANSESOPHAGEAL ECHOCARDIOGRAM (TEE) (N/A)  Subjective: Patient sitting in chair. He states he does not have a lot of incisional pain. His breathing is better.  Objective: Vital signs in last 24 hours: Temp:  [97 F (36.1 C)-97.9 F (36.6 C)] 97.6 F (36.4 C) (10/29 0755) Pulse Rate:  [78-96] 89 (10/29 0755) Cardiac Rhythm:  [-] Normal sinus rhythm (10/29 0800) Resp:  [9-23] 20 (10/29 0755) BP: (99-133)/(55-77) 106/55 mmHg (10/29 0755) SpO2:  [90 %-99 %] 90 % (10/29 0755) Arterial Line BP: (90-148)/(46-63) 138/56 mmHg (10/28 2020) Weight:  [217 lb 9.5 oz (98.7 kg)] 217 lb 9.5 oz (98.7 kg) (10/28 2020)   Current Weight  12/11/14 217 lb 9.5 oz (98.7 kg)      Intake/Output from previous day: 10/28 0701 - 10/29 0700 In: 811.7 [P.O.:120; I.V.:641.7; IV Piggyback:50] Out: 1856 [DJSHF:0263; Chest Tube:1150]   Physical Exam:  Cardiovascular: RRR Pulmonary: Slightly diminished at bases bilaterally; no rales, wheezes, or rhonchi. Abdomen: Soft, non tender, bowel sounds present. Extremities: SCDs in place Wounds: Dressing is clean and dry.  . Chest tubes: to suction, no air leak  Lab Results: CBC: Recent Labs  12/11/14 0537 12/12/14 0546  WBC 12.7* 10.8*  HGB 10.5* 10.3*  HCT 32.3* 31.2*  PLT 516* 401*   BMET:  Recent Labs  12/11/14 0537 12/12/14 0546  NA 129* 127*  K 4.3 4.5  CL 97* 96*  CO2 21* 26  GLUCOSE 222* 212*  BUN 37* 23*  CREATININE 1.04 0.84  CALCIUM 8.7* 8.2*    PT/INR:  Lab Results  Component Value Date   INR 1.46 12/10/2014   ABG:  INR: Will add last result for INR, ABG once components are confirmed Will add last 4 CBG results once components are confirmed  Assessment/Plan:  1. CV - SR in the 80's. 2.  Pulmonary - On 3-4 liters of oxygen via [place]. Chest tube and  pericardial tube with 1150 cc since surgery. Both tubes to remain today. Await today's CXR.  3. Hyponatremia-sodium is 127 4.  Acute blood loss anemia - H and H stable at 10.3 and 31.2 5. Remove a line 6. Will leave foley today 7. GI-advance diet as tolerates. Will decrease IVF once tolerating diet better    Roberts,Steven MPA-C 12/12/2014,9:17 AM  I have seen and examined the patient and agree with the assessment and plan as outlined above.  Abdominal pain has resolved other than expected soreness from surgery - this was likely related to his pericardial effusion.  In my opinion there is no evidence for ongoing "abdominal infection".  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  His long term prognosis is likely very poor.  Mobilize as much as possible Leave chest tubes in place until output minimal  Steven Roberts is not a candidate for pharmacologic anticoagulation at this time for any reason  Rexene Alberts, MD 12/12/2014 12:14 PM

## 2014-12-12 NOTE — Progress Notes (Signed)
Subjective:  Markedly improved since yesterday.  Chest and pericardial tube with significant drainage.  Slept much better last night and left-sided improved.  Minimal pain from chest tube.  Objective:  Vital Signs in the last 24 hours: BP 106/55 mmHg  Pulse 89  Temp(Src) 97.6 F (36.4 C) (Oral)  Resp 20  Ht '5\' 8"'$  (1.727 m)  Wt 98.7 kg (217 lb 9.5 oz)  BMI 33.09 kg/m2  SpO2 90%  Physical Exam: Pleasant Hispanic male in no acute distress Lungs:  Reduced breath sounds Cardiac:  Regular rhythm, normal S1 and S2, no S3 Chest tube with drainage Extremities:  No edema present  Intake/Output from previous day: 10/28 0701 - 10/29 0700 In: 811.7 [P.O.:120; I.V.:641.7; IV Piggyback:50] Out: 9407 [WKGSU:1103; Chest Tube:1150] Weight Filed Weights   12/10/14 2103 12/11/14 0438 12/11/14 2020  Weight: 74.39 kg (164 lb) 87.4 kg (192 lb 10.9 oz) 98.7 kg (217 lb 9.5 oz)    Lab Results: Basic Metabolic Panel:  Recent Labs  12/11/14 0537 12/12/14 0546  NA 129* 127*  K 4.3 4.5  CL 97* 96*  CO2 21* 26  GLUCOSE 222* 212*  BUN 37* 23*  CREATININE 1.04 0.84    CBC:  Recent Labs  12/10/14 1017  12/10/14 1508 12/11/14 0537 12/12/14 0546  WBC 13.3*  < > 13.9* 12.7* 10.8*  NEUTROABS 11.3*  --  11.6*  --   --   HGB 10.8*  < > 11.1* 10.5* 10.3*  HCT 33.1*  < > 33.7* 32.3* 31.2*  MCV 85.3  < > 84.9 85.9 85.7  PLT 532*  < > 546* 516* 401*  < > = values in this interval not displayed.   PROTIME: Lab Results  Component Value Date   INR 1.46 12/10/2014    Telemetry: Sinus rhythm  Assessment/Plan:  1.  Pericardial tamponade-resolved since pericardial window with resolution of symptoms 2.  Metastatic adenocarcinoma of the lung treated with radiation and Tarceva  Recommendations:  I think the EEG could be canceled.  Clinically doing well cardiac-wise now       W. Doristine Church  MD [facility]  12/12/2014, 10:43 AM

## 2014-12-12 NOTE — Progress Notes (Signed)
Received call from [place] in EEG lab, she stated pt has order for EEG that was placed when pt was at [facility]. She wanted to know if pt still needed EEG. Paged Dr Tana Coast. Waiting for call back. Consuelo Pandy RN

## 2014-12-13 ENCOUNTER — Inpatient Hospital Stay (HOSPITAL_COMMUNITY): Payer: Medicaid Other

## 2014-12-13 ENCOUNTER — Encounter: Payer: Self-pay | Admitting: Nurse Practitioner

## 2014-12-13 ENCOUNTER — Ambulatory Visit: Admit: 2014-12-13 | Payer: Medicaid Other

## 2014-12-13 LAB — COMPREHENSIVE METABOLIC PANEL
ALBUMIN: 2.1 g/dL — AB (ref 3.5–5.0)
ALT: 193 U/L — AB (ref 17–63)
AST: 40 U/L (ref 15–41)
Alkaline Phosphatase: 128 U/L — ABNORMAL HIGH (ref 38–126)
Anion gap: 8 (ref 5–15)
BUN: 18 mg/dL (ref 6–20)
CHLORIDE: 97 mmol/L — AB (ref 101–111)
CO2: 24 mmol/L (ref 22–32)
CREATININE: 0.72 mg/dL (ref 0.61–1.24)
Calcium: 8 mg/dL — ABNORMAL LOW (ref 8.9–10.3)
GFR calc Af Amer: >60 mL/min (ref >60)
Glucose, Bld: 152 mg/dL — ABNORMAL HIGH (ref 65–99)
POTASSIUM: 4.4 mmol/L (ref 3.5–5.1)
SODIUM: 129 mmol/L — AB (ref 135–145)
Total Bilirubin: 2.2 mg/dL — ABNORMAL HIGH (ref 0.3–1.2)
Total Protein: 4.7 g/dL — ABNORMAL LOW (ref 6.5–8.1)

## 2014-12-13 LAB — CBC
HCT: 32.1 % — ABNORMAL LOW (ref 39.0–52.0)
Hemoglobin: 10 g/dL — ABNORMAL LOW (ref 13.0–17.0)
MCH: 27.1 pg (ref 26.0–34.0)
MCHC: 31.2 g/dL (ref 30.0–36.0)
MCV: 87 fL (ref 78.0–100.0)
PLATELETS: 325 10*3/uL (ref 150–400)
RBC: 3.69 MIL/uL — AB (ref 4.22–5.81)
RDW: 16.5 % — AB (ref 11.5–15.5)
WBC: 10.7 10*3/uL — AB (ref 4.0–10.5)

## 2014-12-13 LAB — GLUCOSE, CAPILLARY
GLUCOSE-CAPILLARY: 135 mg/dL — AB (ref 65–99)
GLUCOSE-CAPILLARY: 178 mg/dL — AB (ref 65–99)
Glucose-Capillary: 166 mg/dL — ABNORMAL HIGH (ref 65–99)
Glucose-Capillary: 192 mg/dL — ABNORMAL HIGH (ref 65–99)

## 2014-12-13 MED ORDER — FUROSEMIDE 10 MG/ML IJ SOLN
20.0000 mg | Freq: Once | INTRAMUSCULAR | Status: AC
  Administered 2014-12-13: 20 mg via INTRAVENOUS
  Filled 2014-12-13: qty 2

## 2014-12-13 MED ORDER — FUROSEMIDE 10 MG/ML IJ SOLN
20.0000 mg | Freq: Once | INTRAMUSCULAR | Status: AC
  Administered 2014-12-14: 20 mg via INTRAVENOUS
  Filled 2014-12-13: qty 2

## 2014-12-13 NOTE — Assessment & Plan Note (Signed)
Patient has had some mild bilateral lower extremity peripheral edema; but both patient and his wife state that his peripheral edema has increased within this past week.

## 2014-12-13 NOTE — Assessment & Plan Note (Signed)
Bilirubin has increased to 3.13.  Patient is complaining of some increased generalized abdominal discomfort; as well, as abdominal distention.  He denies any nausea, vomiting, or diarrhea.  He has history of chronic constipation; although, he did have a small bowel movement just yesterday.  On exam.-Patient with abdomen is slightly firm and distended.  Bowel sounds decreased.  There is some mild tenderness to his entire abdomen with palpation.  There is no flank pain.  Also, alkaline phosphatase is elevated to 227; and liver enzymes are elevated with AST of 179 and ALT of 540.  Due to elevated liver enzymes and hyperbilirubinemia.-Patient will be transported to the emergency department for further evaluation and management.  Brief history.  Report was called to the emergency department charge nurse prior to patient being transported to the emergency department via wheelchair for the [place].

## 2014-12-13 NOTE — Assessment & Plan Note (Signed)
Bilirubin has increased to 3.13.  Patient is complaining of some increased generalized abdominal discomfort; as well, as abdominal distention.  He denies any nausea, vomiting, or diarrhea.  He has history of chronic constipation; although, he did have a small bowel movement just yesterday.  On exam.-Patient with abdomen is slightly firm and distended.  Bowel sounds decreased.  There is some mild tenderness to his entire abdomen with palpation.  There is no flank pain.  Also, alkaline phosphatase is elevated to 227; and liver enzymes are elevated with AST of 179 and ALT of 540.  Due to elevated liver enzymes and hyperbilirubinemia.-Patient will be transported to the emergency department for further evaluation and management.  Brief history.  Report was called to the emergency department charge nurse prior to patient being transported to the emergency department via wheelchair for the [place].

## 2014-12-13 NOTE — Progress Notes (Signed)
Quick Note:  Call patient with the result and ask him to hold his treatment with Tarceva for 5 days until I see him again later this week. ______

## 2014-12-13 NOTE — Progress Notes (Signed)
[facility]                                                                              Patient Demographics  Steven Roberts, is a 49 y.o. male, DOB - 02-17-1965, QVZ:[id number]  Admit date - 12/10/2014   Admitting Physician Elmarie Shiley, MD  Outpatient Primary MD for the patient is Pcp Not In System  LOS - 3   Chief Complaint  Patient presents with  . Possible biliary obstruction   . Lung Cancer  . Cough  . Abnormal Labs        Brief HPI   Per Dr. Tyrell Antonio due on 12/10/14 Steven Roberts is a 49 y.o. male Hispanic, with recent diagnosis of lung ca 10-2014, metastasis diseases to lung. He has been receiving tarceva and radiation treatments. He went to see his oncologist on the day of admission for regular follow up and was found to have abnormal labs and was refered to [facility] for further evaluation. He was having abdominal pain, epigastric,RU quadrant and supra pubic pain for 1 week. He has had poor oral intake. He reported epigastric pain with meals and early satiety. Denied vomiting, diarrhea. Had small Bowel movement yesterday. He reported Lower extremity edema and abdominal distension for 1 week. He also reported orthopnea and dyspnea on exertion.  On admission, abdominal ultrasound revealed 3 liver masses likely metastatic, cholelithiasis and right pleural effusion. HIDA is pending, and MRCP is negative for biliary dilatation. He was initiated on IV Unasyn, with cultures pending. Surgery was consulted for possible acute cholecystitis. HIDA scan was ordered but could not be completed .Other studies included 2 D echo after massive pericardial effusion was noted on the MRI abdomen. 2-D echo showed large free-flowing pericardial effusion with tamponade physiology. The patient was emergently transferred to [facility] with cardiothoracic consult. Patient underwent subxyphoid pericardial window on 10/28.  Assessment & Plan   Pericardial effusion with tamponade  physiology - MRI of the abdomen noted massive pericardial effusion, 2-D echo showed large free-flowing pericardial effusion with tamponade physiology. - Cardiothoracic surgery was consulted, patient underwent subxyphoid pericardial window on 10/28. - Highly appreciate cardiothoracic surgery assistance, management per [facility]  Transaminases, Hyperbilirubinemia, Abdominal Pain: This could be related to cholelithiasis,cholecystitis vs adverse effect from chemo Tarceva, vs metastasis diseases. Abdominal pain resolved - Continue IV Unasyn,  MRCP showed no biliary dilatation. HIDA scan could not be completed.  -  Hold tarceva, follow LFTs - Per general surgery, significant medical comorbidities, will not pursue gallbladder workup at this time unless the symptoms returned  Acute encephalopathy with blank stare spell- possibly due to tamponade  - Per documentation, patient apparently had unresponsive episode on 10/28 at [facility] with blank stare, patient did not remember. VS were normal neuro checks were normal and reactive. Possibly patient's effusion was causing the spells that he was having. -Patient recently had an MRI of the brain on 10/6 which had not shown any evidence of metastatic disease to the brain or meningitis.  - EEG consulted, patient has been stable with no recurrent symptoms  Hyponatremia; improving Likely related to liver issues or hyperkalemia, SIADH,  urine osmolarity 757. Currently on gentle hydration, was NPO this AM. Once patient on diet, will place on saline lock and fluid restriction.    Lower extremity edema;  Could be related to liver failure, will probably need Lasix if renal function remain stable and no hypotension due to infectious process.   Lung Cancer, metastasis ; Oncology following   Diabetes mellitus: Hyperglycemia SSI.  HBA1c 8, new diagnosis of diabetes. Will place on oral hypoglycemics at the time of discharge   Code Status: full code  Family  Communication: Discussed in detail with the patient, all imaging results, lab results explained to the patient and wife at the bedside  Disposition Plan:   Time Spent in minutes  25 minutes  Procedures  Pericardial window   Consults   Cardiothoracic surgery  Gen. surgery  Oncology   DVT Prophylaxis   SCD's  Medications  Scheduled Meds: . acetaminophen  1,000 mg Oral 4 times per day   Or  . acetaminophen (TYLENOL) oral liquid 160 mg/5 mL  1,000 mg Oral 4 times per day  . ampicillin-sulbactam (UNASYN) IV  3 g Intravenous Q6H  . docusate sodium  100 mg Oral BID  . insulin aspart  0-9 Units Subcutaneous TID WC  . sodium chloride  3 mL Intravenous Q12H   Continuous Infusions: . 0.9 % NaCl with KCl 20 mEq / L 50 mL/hr at 12/13/14 0747   PRN Meds:.sodium chloride, morphine injection, ondansetron **OR** ondansetron (ZOFRAN) IV, oxyCODONE, potassium chloride, sodium chloride   Antibiotics   Anti-infectives    Start     Dose/Rate Route Frequency Ordered Stop   12/12/14 1200  Ampicillin-Sulbactam (UNASYN) 3 g in sodium chloride 0.9 % 100 mL IVPB     3 g 100 mL/hr over 60 Minutes Intravenous Every 6 hours 12/12/14 1119     12/11/14 2100  cefUROXime (ZINACEF) 1.5 g in dextrose 5 % 50 mL IVPB     1.5 g 100 mL/hr over 30 Minutes Intravenous Every 12 hours 12/11/14 2052 12/12/14 0904   12/10/14 2200  Ampicillin-Sulbactam (UNASYN) 3 g in sodium chloride 0.9 % 100 mL IVPB  Status:  Discontinued     3 g 100 mL/hr over 60 Minutes Intravenous Every 6 hours 12/10/14 1820 12/11/14 2052   12/10/14 1715  ampicillin-sulbactam (UNASYN) 1.5 g in sodium chloride 0.9 % 50 mL IVPB     1.5 g 100 mL/hr over 30 Minutes Intravenous  Once 12/10/14 1713 12/10/14 1902        Subjective:   Steven Roberts was seen and examined today.  Incisional pain improving, sitting up in the chair, wife at the bedside, no acute issues overnight. Shortness of breath significantly improved. Denied any dizziness,  nausea, vomiting, abdominal pain, diarrhea. Afebrile. No new weakness or any staring spells.     Objective:   Blood pressure 100/56, pulse 85, temperature 97.6 F (36.4 C), temperature source Oral, resp. rate 25, height '5\' 8"'$  (1.727 m), weight 93.3 kg (205 lb 11 oz), SpO2 95 %.  Wt Readings from Last 3 Encounters:  12/13/14 93.3 kg (205 lb 11 oz)  12/10/14 74.753 kg (164 lb 12.8 oz)  12/10/14 74.39 kg (164 lb)     Intake/Output Summary (Last 24 hours) at 12/13/14 0918 Last data filed at 12/13/14 0747  Gross per 24 hour  Intake 2849.17 ml  Output   1800 ml  Net 1049.17 ml    Exam  General: Alert and oriented x 3, NAD  HEENT:  PERRLA, EOMI  Neck: Supple, no JVD, no masses  CVS: S1 S2 clear, RRR  Respiratory:Decreased breath sounds at the bases   Abdomen: Soft, nontender, nondistended, + bowel sounds  Ext: no cyanosis clubbing, 1+ edema  Neuro: no new deficits  Skin: No rashes  Psych: Normal affect and demeanor, alert and oriented x3    Data Review   Micro Results Recent Results (from the past 240 hour(s))  MRSA PCR Screening     Status: None   Collection Time: 12/11/14  8:44 AM  Result Value Ref Range Status   MRSA by PCR NEGATIVE NEGATIVE Final    Comment:        The GeneXpert MRSA Assay (FDA approved for NASAL specimens only), is one component of a comprehensive MRSA colonization surveillance program. It is not intended to diagnose MRSA infection nor to guide or monitor treatment for MRSA infections.   Fungus Culture with Smear     Status: None (Preliminary result)   Collection Time: 12/11/14 12:54 PM  Result Value Ref Range Status   Specimen Description FLUID PERICARDIAL  Final   Special Requests [place] B PT ON UNASYN  Final   Fungal Smear   Final    NO YEAST OR FUNGAL ELEMENTS SEEN Performed at [facility]    Culture   Final    CULTURE IN PROGRESS FOR FOUR WEEKS Performed at [facility]    Report Status PENDING   Incomplete  AFB culture with smear     Status: None (Preliminary result)   Collection Time: 12/11/14 12:54 PM  Result Value Ref Range Status   Specimen Description FLUID PERICARDIAL  Final   Special Requests SPEC B PT ON UNASYN  Final   Acid Fast Smear   Final    NO ACID FAST BACILLI SEEN Performed at [facility]    Culture   Final    CULTURE WILL BE EXAMINED FOR 6 WEEKS BEFORE ISSUING A FINAL REPORT Performed at [facility]    Report Status PENDING  Incomplete  Culture, body fluid-bottle     Status: None (Preliminary result)   Collection Time: 12/11/14 12:54 PM  Result Value Ref Range Status   Specimen Description FLUID PERICARDIAL  Final   Special Requests NONE  Final   Culture NO GROWTH 2 DAYS  Final   Report Status PENDING  Incomplete  Gram stain     Status: None   Collection Time: 12/11/14 12:54 PM  Result Value Ref Range Status   Specimen Description FLUID PERICARDIAL  Final   Special Requests NONE  Final   Gram Stain   Final    FEW WBC PRESENT,BOTH PMN AND MONONUCLEAR NO ORGANISMS SEEN    Report Status 12/11/2014 FINAL  Final    Radiology Reports Dg Chest 2 View  12/10/2014  CLINICAL DATA:  Cough and shortness of breath. Current diagnosis of lung cancer. EXAM: CHEST  2 VIEW COMPARISON:  PET-CT dated 11/03/2014 FINDINGS: Cardiomediastinal silhouette is enlarged. Mediastinal contours is obscured by a large left perihilar mass. There are bilateral pleural effusions with associated subsegmental bibasilar atelectasis. Osseous structures are without acute abnormality. Soft tissues are grossly normal. IMPRESSION: Large left perihilar mass, consistent with known primary lung malignancy. Bilateral pleural effusions, moderate in size. Enlarged cardiac silhouette. Pericardial effusion cannot be excluded. Electronically Signed   By: Fidela Salisbury M.D.   On: 12/10/2014 15:21   Dg Abd 1 View  12/02/2014  ADDENDUM REPORT: 12/02/2014 16:24 ADDENDUM: Not  mentioned above is small-moderate amount  of stool in the ascending colon. No evidence of rectal fecal impaction. Electronically Signed   By: Kathreen Devoid   On: 12/02/2014 16:24  12/02/2014  CLINICAL DATA:  The mid and low abdominal pain EXAM: ABDOMEN - 1 VIEW COMPARISON:  PET-CT 11/03/2014 FINDINGS: The bowel gas pattern is normal. No radio-opaque calculi or other significant radiographic abnormality are seen. IMPRESSION: Negative. Electronically Signed: By: Kathreen Devoid On: 12/02/2014 15:39   Ct Head Wo Contrast  12/11/2014  CLINICAL DATA:  Rolled out of bed and hit right upper posterior head. Initial encounter. EXAM: CT HEAD WITHOUT CONTRAST TECHNIQUE: Contiguous axial images were obtained from the base of the skull through the vertex without intravenous contrast. COMPARISON:  MRI of the brain performed 11/19/2014 FINDINGS: There is no evidence of acute infarction, mass lesion, or intra- or extra-axial hemorrhage on CT. The posterior fossa, including the cerebellum, brainstem and fourth ventricle, is within normal limits. The third and lateral ventricles, and basal ganglia are unremarkable in appearance. The cerebral hemispheres are symmetric in appearance, with normal gray-white differentiation. No mass effect or midline shift is seen. There is no evidence of fracture; visualized osseous structures are unremarkable in appearance. The orbits are within normal limits. The paranasal sinuses and mastoid air cells are well-aerated. No significant soft tissue abnormalities are seen. IMPRESSION: No evidence of traumatic intracranial injury or fracture Electronically Signed   By: Garald Balding M.D.   On: 12/11/2014 05:23   Mr Jeri Cos [place] Contrast  11/19/2014  CLINICAL DATA:  Stage IV non-small cell lung cancer. Left-sided headaches for 3 months. EXAM: MRI HEAD WITHOUT AND WITH CONTRAST TECHNIQUE: Multiplanar, multiecho pulse sequences of the brain and surrounding structures were obtained without and with  intravenous contrast. CONTRAST:  17 mL MultiHance. COMPARISON:  None. FINDINGS: No acute infarct, hemorrhage, or mass lesion is present. The ventricles are of normal size. No significant extraaxial fluid collection is present. No significant white matter changes are present. The internal auditory canals and brainstem are normal. Flow is present in the major intracranial arteries. The globes orbits are intact. Mild mucosal thickening is present in the maxillary sinuses and ethmoid air cells bilaterally. There is minimal mucosal thickening in the right frontal sinus. The sphenoid sinuses and mastoid air cells are clear. Skullbase is within normal limits.  Midline images are unremarkable. Postcontrast images demonstrate no pathologic enhancement to suggest metastatic disease the brain or meninges. Linear enhancement in the right temporal lobe is compatible with a developmental venous anomaly. IMPRESSION: 1. No evidence for metastatic disease to the brain or meninges. 2. 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 3. Minimal sinus disease. Electronically Signed   By: San Morelle M.D.   On: 11/19/2014 09:03   Nm Hepatobiliary Liver Func  12/11/2014  CLINICAL DATA:  Right upper quadrant abdominal pain, nausea, vomiting. EXAM: NUCLEAR MEDICINE HEPATOBILIARY IMAGING TECHNIQUE: Sequential images of the abdomen were obtained out to 60 minutes following intravenous administration of radiopharmaceutical. RADIOPHARMACEUTICALS:  7.4 mCi Tc-84m Choletec IV COMPARISON:  MRI of December 10, 2014. FINDINGS: Normal uptake within hepatic parenchyma is noted. Imaging was only carried out to 40 minutes after isotope administration as the patient had to go to emergency heart surgery. Filling of the duodenum is noted, but no definite gallbladder filling is noted. IMPRESSION: Exam was stopped at 40 minutes after radiotracer administration as patient had to go to surgery. No definite filling of the  gallbladder is noted on the available images, but given that  the exam did not go 1 hour, this exam is nondiagnostic for the evaluation for cholecystitis. Electronically Signed   By: Marijo Conception, M.D.   On: 12/11/2014 14:53   Mr Abdomen Mrcp Wo Cm  12/11/2014  CLINICAL DATA:  Abdominal pain. Left upper lobe adenocarcinoma, metastatic. EXAM: MRI ABDOMEN WITHOUT CONTRAST  (INCLUDING MRCP) TECHNIQUE: Multiplanar multisequence MR imaging of the abdomen was performed. Heavily T2-weighted images of the biliary and pancreatic ducts were obtained, and three-dimensional MRCP images were rendered by post processing. COMPARISON:  11/03/2014 ; 12/10/2014 FINDINGS: Despite efforts by the technologist and patient, motion artifact is present on today's exam and could not be eliminated. This reduces exam sensitivity and specificity. Lower chest: Massive pericardial effusion, new compared to 11/03/14. Considerable bilateral pleural effusions. Hepatobiliary: Multiple T2 hyperintense liver lesions are present. This includes a 1.9 by 1.3 cm lesion in segment 2; a 2.8 by 1.8 cm lesion in segment 3; a 3.0 by 2.4 cm lesion posteriorly in segment 4A near the IVC ; and several other smaller right hepatic lobe lesions. Periportal edema is present. This is nonspecific. Attempts at at diagnostic MRCP images worth awarded by motion artifact. There is likely some gallbladder wall thickening and suspected gallstones. No definite biliary dilatation. Pancreas: Grossly unremarkable. Spleen: Unremarkable Adrenals/Urinary Tract: Unremarkable Stomach/Bowel: Unremarkable Vascular/Lymphatic: Unremarkable Other: Upper abdominal ascites especially tracking around the spleen and in the paracolic gutters. Musculoskeletal: Unremarkable where visualized. IMPRESSION: 1. Multiple hepatic lesions measuring up to 3 cm in long axis are T2 hyperintense. On sonography these appeared echogenic. On outside CT angiogram of the chest dated 10/13/2014 there was some  arterial phase enhancement associated with some of these lesions. On the nuclear medicine PET-CT from 21/30/8657, no hypermetabolic liver lesion is observed. Accordingly, the possibility is certainly raised that these lesions represent hemangiomas rather than necessarily being due to metastatic disease. Unfortunately IV contrast could not be administered to confirm this. As part of future staging workups when the patient is less acutely ill, contrast-enhanced hepatic protocol CT or MRI would be suggested. 2. Massive pericardial effusion, new compared to 11/03/2014. Correlation with Beck's triad or other signs of tamponade recommended. 3. Bilateral pleural effusions and ascites. 4. Thick-walled gallbladder, nonspecific, but cholecystitis is not excluded. Cholelithiasis. No definite biliary dilatation ; MRCP sequences or on feasible due to motion artifact. 5. Nonspecific periportal edema. These results will be called to the ordering clinician or representative by the Radiologist Assistant, and communication documented in the PACS or zVision Dashboard. Electronically Signed   By: Van Clines M.D.   On: 12/11/2014 07:53   Mr 3d Recon At Scanner  12/11/2014  CLINICAL DATA:  Abdominal pain. Left upper lobe adenocarcinoma, metastatic. EXAM: MRI ABDOMEN WITHOUT CONTRAST  (INCLUDING MRCP) TECHNIQUE: Multiplanar multisequence MR imaging of the abdomen was performed. Heavily T2-weighted images of the biliary and pancreatic ducts were obtained, and three-dimensional MRCP images were rendered by post processing. COMPARISON:  11/03/2014 ; 12/10/2014 FINDINGS: Despite efforts by the technologist and patient, motion artifact is present on today's exam and could not be eliminated. This reduces exam sensitivity and specificity. Lower chest: Massive pericardial effusion, new compared to 11/03/14. Considerable bilateral pleural effusions. Hepatobiliary: Multiple T2 hyperintense liver lesions are present. This includes a 1.9  by 1.3 cm lesion in segment 2; a 2.8 by 1.8 cm lesion in segment 3; a 3.0 by 2.4 cm lesion posteriorly in segment 4A near the IVC ; and several other smaller right hepatic lobe lesions. Periportal edema is present. This  is nonspecific. Attempts at at diagnostic MRCP images worth awarded by motion artifact. There is likely some gallbladder wall thickening and suspected gallstones. No definite biliary dilatation. Pancreas: Grossly unremarkable. Spleen: Unremarkable Adrenals/Urinary Tract: Unremarkable Stomach/Bowel: Unremarkable Vascular/Lymphatic: Unremarkable Other: Upper abdominal ascites especially tracking around the spleen and in the paracolic gutters. Musculoskeletal: Unremarkable where visualized. IMPRESSION: 1. Multiple hepatic lesions measuring up to 3 cm in long axis are T2 hyperintense. On sonography these appeared echogenic. On outside CT angiogram of the chest dated 10/13/2014 there was some arterial phase enhancement associated with some of these lesions. On the nuclear medicine PET-CT from 77/82/4235, no hypermetabolic liver lesion is observed. Accordingly, the possibility is certainly raised that these lesions represent hemangiomas rather than necessarily being due to metastatic disease. Unfortunately IV contrast could not be administered to confirm this. As part of future staging workups when the patient is less acutely ill, contrast-enhanced hepatic protocol CT or MRI would be suggested. 2. Massive pericardial effusion, new compared to 11/03/2014. Correlation with Beck's triad or other signs of tamponade recommended. 3. Bilateral pleural effusions and ascites. 4. Thick-walled gallbladder, nonspecific, but cholecystitis is not excluded. Cholelithiasis. No definite biliary dilatation ; MRCP sequences or on feasible due to motion artifact. 5. Nonspecific periportal edema. These results will be called to the ordering clinician or representative by the Radiologist Assistant, and communication documented  in the PACS or zVision Dashboard. Electronically Signed   By: Van Clines M.D.   On: 12/11/2014 07:53   US Abdomen Limited  12/10/2014  CLINICAL DATA:  Evaluate for cholecystitis/cause of hepatitis EXAM: US ABDOMEN LIMITED - RIGHT UPPER QUADRANT COMPARISON:  None. FINDINGS: Gallbladder: Multiple gallstones within the partially full gallbladder. The wall is circumferentially thickened to 6 mm. No pericholecystic edema. Sonographer reports focal tenderness. Common bile duct: Diameter: 2 mm Liver: Three echogenic masses within the liver, ranging between 16 and 24 mm in maximal diameter, without hypoechoic halo. Antegrade flow in the imaged portal venous system. Right pleural effusion, small by chest radiography. IMPRESSION: 1. At least 3 liver masses, primarily concerning for lung cancer metastases. 2. Cholelithiasis with conflicting findings for acute cholecystitis. There is gallbladder wall thickening and focal tenderness, but the gallbladder is not dilated as usually seen with cystic duct obstruction and acute cholecystitis. 3. Right pleural effusion. Electronically Signed   By: Monte Fantasia M.D.   On: 12/10/2014 16:12   Dg Chest Port 1 View  12/13/2014  CLINICAL DATA:  Pneumothorax, chest tube.  Pericardial effusion EXAM: PORTABLE CHEST 1 VIEW COMPARISON:  Radiograph 12/12/2014 FINDINGS: LEFT chest tube in place. No appreciable pneumothorax. Prominent cardiac silhouette is similar prior. LEFT upper lobe mass is difficult to define. There is bilateral pleural effusions not changed. IMPRESSION: 1. No interval change. 2. LEFT chest tube in place without pneumothorax. 3. Central venous congestion and bilateral effusions. 4. Prominent cardiac silhouette is not changed. Electronically Signed   By: Suzy Bouchard M.D.   On: 12/13/2014 08:17   Dg Chest Port 1 View  12/12/2014  CLINICAL DATA:  Pneumothorax, chest tube present. History of pericardial effusion with cardiac tamponade. Cancer. EXAM:  PORTABLE CHEST 1 VIEW COMPARISON:  12/11/2014 FINDINGS: Low lung volumes. Left chest tube.  No pneumothorax is seen. Known left upper lobe mass with lymphangitic spread in the left upper lobe. Superimposed right perihilar edema and/or infection. Additional patchy opacity in the right lower lobe, atelectasis versus pneumonia. Suspected small bilateral pleural effusions. Cardiomegaly. IMPRESSION: Left chest tube.  No pneumothorax is seen. Known left  upper lobe mass with lymphangitic spread in the left upper lobe. Superimposed right perihilar edema and/or infection. Additional patchy right lower lobe opacity, atelectasis versus pneumonia. Small bilateral pleural effusions. Electronically Signed   By: Julian Hy M.D.   On: 12/12/2014 12:24   Dg Chest Port 1 View  12/11/2014  CLINICAL DATA:  Atelectasis EXAM: PORTABLE CHEST 1 VIEW COMPARISON:  12/10/2014 FINDINGS: Marked cardiopericardial enlargement. There is hazy opacity over the bilateral chest compatible with layering pleural effusions. New placement of drains over the chest post subxiphoid window. Hazy mass in the left upper chest in this patient with known adenocarcinoma. IMPRESSION: 1. Decreased cardiopericardial enlargement after pericardial effusion drainage/drain placement. 2. Lower lung volumes with bilateral layering pleural effusion. 3. Extensive left upper lobe mass. Electronically Signed   By: Monte Fantasia M.D.   On: 12/11/2014 14:23    CBC  Recent Labs Lab 12/10/14 1017 12/10/14 1508 12/11/14 0537 12/12/14 0546 12/13/14 0435  WBC 13.3* 13.9* 12.7* 10.8* 10.7*  HGB 10.8* 11.1* 10.5* 10.3* 10.0*  HCT 33.1* 33.7* 32.3* 31.2* 32.1*  PLT 532* 546* 516* 401* 325  MCV 85.3 84.9 85.9 85.7 87.0  MCH 27.8 28.0 27.9 28.3 27.1  MCHC 32.6 32.9 32.5 33.0 31.2  RDW 16.1* 15.8* 16.2* 16.3* 16.5*  LYMPHSABS 0.8* 1.0  --   --   --   MONOABS 1.1* 1.3*  --   --   --   EOSABS 0.0 0.0  --   --   --   BASOSABS 0.0 0.0  --   --   --      Chemistries   Recent Labs Lab 12/10/14 1017 12/10/14 1508 12/11/14 0537 12/12/14 0546 12/13/14 0435  NA 127* 126* 129* 127* 129*  K 4.8 4.6 4.3 4.5 4.4  CL  --  93* 97* 96* 97*  CO2 23 23 21* 26 24  GLUCOSE 289* 231* 222* 212* 152*  BUN 41.3* 42* 37* 23* 18  CREATININE 1.2 1.11 1.04 0.84 0.72  CALCIUM 8.9 8.7* 8.7* 8.2* 8.0*  AST 179 Repeated and Verified* 169* 117*  --  40  ALT 540 Repeated and Verified* 505* 422*  --  193*  ALKPHOS 227* 218* 192*  --  128*  BILITOT 3.13* 3.3* 3.2*  --  2.2*   ------------------------------------------------------------------------------------------------------------------ estimated creatinine clearance is 126.6 mL/min (by C-G formula based on Cr of 0.72). ------------------------------------------------------------------------------------------------------------------  Recent Labs  12/10/14 1508  HGBA1C 8.0*   ------------------------------------------------------------------------------------------------------------------ No results for input(s): CHOL, HDL, LDLCALC, TRIG, CHOLHDL, LDLDIRECT in the last 72 hours. ------------------------------------------------------------------------------------------------------------------ No results for input(s): TSH, T4TOTAL, T3FREE, THYROIDAB in the last 72 hours.  Invalid input(s): FREET3 ------------------------------------------------------------------------------------------------------------------ No results for input(s): VITAMINB12, FOLATE, FERRITIN, TIBC, IRON, RETICCTPCT in the last 72 hours.  Coagulation profile  Recent Labs Lab 12/10/14 1508  INR 1.46    No results for input(s): DDIMER in the last 72 hours.  Cardiac Enzymes No results for input(s): CKMB, TROPONINI, MYOGLOBIN in the last 168 hours.  Invalid input(s): CK ------------------------------------------------------------------------------------------------------------------ Invalid input(s): [place]  12/11/14 2137 12/12/14 0808 12/12/14 1130 12/12/14 1439 12/12/14 2205 12/13/14 0821  GLUCAP 238* 173* 206* 202* 192* 135*     Bertrice Leder M.D. [facility] 12/13/2014, 9:18 AM  Pager: [phone] Between 7am to 7pm - call Pager - [phone]  After 7pm go to [email or web address] - password [email or web address]  Call night coverage person covering after 7pm

## 2014-12-13 NOTE — Progress Notes (Signed)
Subjective:  Minimal pain from chest tube, no shortness of breath, still significant drainage  Objective:  Vital Signs in the last 24 hours: BP 110/61 mmHg  Pulse 88  Temp(Src) 97.7 F (36.5 C) (Oral)  Resp 28  Ht '5\' 8"'$  (1.727 m)  Wt 93.3 kg (205 lb 11 oz)  BMI 31.28 kg/m2  SpO2 99%  Physical Exam: Pleasant Hispanic male in no acute distress Lungs:  Reduced breath sounds Cardiac:  Regular rhythm, normal S1 and S2, no S3 Chest tube with drainage Extremities:  No edema present  Intake/Output from previous day: 10/29 0701 - 10/30 0700 In: 2910 [P.O.:1260; I.V.:1200; IV Piggyback:450] Out: 1850 [Urine:1250; Chest Tube:600] Weight Filed Weights   12/11/14 0438 12/11/14 2020 12/13/14 0445  Weight: 87.4 kg (192 lb 10.9 oz) 98.7 kg (217 lb 9.5 oz) 93.3 kg (205 lb 11 oz)    Lab Results: Basic Metabolic Panel:  Recent Labs  12/12/14 0546 12/13/14 0435  NA 127* 129*  K 4.5 4.4  CL 96* 97*  CO2 26 24  GLUCOSE 212* 152*  BUN 23* 18  CREATININE 0.84 0.72    CBC:  Recent Labs  12/10/14 1508  12/12/14 0546 12/13/14 0435  WBC 13.9*  < > 10.8* 10.7*  NEUTROABS 11.6*  --   --   --   HGB 11.1*  < > 10.3* 10.0*  HCT 33.7*  < > 31.2* 32.1*  MCV 84.9  < > 85.7 87.0  PLT 546*  < > 401* 325  < > = values in this interval not displayed.   PROTIME: Lab Results  Component Value Date   INR 1.46 12/10/2014    Telemetry: Sinus rhythm  Assessment/Plan:  1.  Pericardial tamponade-resolved since pericardial window with resolution of symptoms 2.  Metastatic adenocarcinoma of the lung treated with radiation and Tarceva  Recommendations:  Cardiology will sign off       W. Doristine Church  MD [facility]  12/13/2014, 12:07 PM

## 2014-12-13 NOTE — Progress Notes (Addendum)
      [address]       [place] [address]             [phone]        2 Days Post-Op Procedure(s) (LRB): SUBXYPHOID PERICARDIAL WINDOW (N/A) TRANSESOPHAGEAL ECHOCARDIOGRAM (TEE) (N/A)  Subjective: Patient sitting in chair. He states he does not have a lot of incisional pain. His breathing is "good"  Objective: Vital signs in last 24 hours: Temp:  [97.4 F (36.3 C)-97.9 F (36.6 C)] 97.6 F (36.4 C) (10/30 0747) Pulse Rate:  [84-95] 85 (10/30 0747) Cardiac Rhythm:  [-] Normal sinus rhythm (10/30 0747) Resp:  [18-25] 25 (10/30 0747) BP: (96-109)/(56-64) 100/56 mmHg (10/30 0747) SpO2:  [94 %-98 %] 95 % (10/30 0747) Weight:  [205 lb 11 oz (93.3 kg)] 205 lb 11 oz (93.3 kg) (10/30 0445)   Current Weight  12/13/14 205 lb 11 oz (93.3 kg)      Intake/Output from previous day: 10/29 0701 - 10/30 0700 In: 2910 [P.O.:1260; I.V.:1200; IV Piggyback:450] Out: 1850 [Urine:1250; Chest Tube:600]   Physical Exam:  Cardiovascular: RRR Pulmonary: Slightly diminished at bases bilaterally; no rales, wheezes, or rhonchi. Abdomen: Soft, non tender, bowel sounds present. Extremities: Bilateral LE edema Wounds: Dressing is clean and dry.  . Chest tubes: to suction, no air leak  Lab Results: CBC:  Recent Labs  12/12/14 0546 12/13/14 0435  WBC 10.8* 10.7*  HGB 10.3* 10.0*  HCT 31.2* 32.1*  PLT 401* 325   BMET:   Recent Labs  12/12/14 0546 12/13/14 0435  NA 127* 129*  K 4.5 4.4  CL 96* 97*  CO2 26 24  GLUCOSE 212* 152*  BUN 23* 18  CREATININE 0.84 0.72  CALCIUM 8.2* 8.0*    PT/INR:  Lab Results  Component Value Date   INR 1.46 12/10/2014   ABG:  INR: Will add last result for INR, ABG once components are confirmed Will add last 4 CBG results once components are confirmed  Assessment/Plan:  1. CV - SR in the 80's. 2.  Pulmonary - Patient with stage IV adenocarcinoma of lung and had been receiving radiation and chemotherapies  (Tarceva).On 3-4 liters of oxygen via [place]. Chest tube and pericardial tube with 600 cc since surgery. Both tubes to remain today. CXR shows no pneumothorax, bilateral pleural effusions, and cardiomegaly. Pericardial fluid shows elevated LDH 1013, no AFB to date. Await cytology. 3. Hyponatremia-sodium slightly increased to 129 4.  Acute blood loss anemia - H and H stable at 10 and 33.1 5. Remove foley and heplock IV 7. GI-advance diet  8.ID-on Unasyn 9. Management per medicine   ZIMMERMAN,DONIELLE MPA-C 12/13/2014,9:44 AM   I have seen and examined the patient and agree with the assessment and plan as outlined.  Reason for IV antibiotics to continue remains unclear.  Rexene Alberts, MD 12/13/2014 1:35 PM

## 2014-12-13 NOTE — Assessment & Plan Note (Signed)
Rash to patient's face, scalp, neck, anterior chest, and back has increased since last exam.  The rash is secondary to the Tarceva medication.  Advised both patient and his wife would need to consider initiating clindamycin gel to rash-but will hold on prescribing any new medications since patient is being transferred to the emergency department for further evaluation and management.

## 2014-12-13 NOTE — Assessment & Plan Note (Signed)
Patient continues to take Tarceva oral therapy as directed; and also continues with radiation treatments.  Patient is noted to have increased rash to face, scalp, neck, anterior chest and back.  Briefly discussed need to initiate clindamycin gel to rash-but will hold on prescribing clindamycin gel.  Since patient is to be transferred to the emergency department.  Patient is scheduled to return on 12/17/2014 for labs and a follow-up visit.

## 2014-12-13 NOTE — Assessment & Plan Note (Signed)
Albumen down to 3.0 today.  Patient does have increased bilateral lower extremity peripheral edema.  He was encouraged to push protein in his diet is much as possible.

## 2014-12-13 NOTE — Progress Notes (Signed)
SYMPTOM MANAGEMENT CLINIC   HPI: Steven Roberts 49 y.o. male diagnosed with lung cancer.  Currently undergoing Tarceva oral therapy and radiation treatments.  Patient presented to the [facility] today for labs, follow up visit in medical oncology Department, and his radiation treatment.  He was complaining of abdominal discomfort and abdominal distention as well.  Patient's wife reported last week.  The patient was having some episodes of apnea following intense coughing; but that patient quickly recovered.  Patient's wife states that patient has had no further apneic episodes since decreasing his pain medication.  Patient actually has been able to discontinue all pain medication within this past week.  He denies any recent fevers or chills.   HPI  ROS  Past Medical History  Diagnosis Date  . Bone cancer ([place]) 11/03/14 Pet scan    left 6th rib, right hip  . Lung cancer ([place]) 10/23/14    LUL adenocarcinoma, non-small cell  . Pericardial effusion with cardiac tamponade 12/11/2014  . Pulmonary infiltrates  c/w Adenoca/ Stage IV  10/13/2014    - symptom onset march 2016 - Baseline cxr on July 20 2014 reported to be nl  > then CT chest 10/13/2014 LUL as dz  Assoc with   LUL nodular density/ mild adenopathy - Quantiferon Gold 10/13/2014 > neg  - FOB 10/23/14 > cobblestoning beyond Lingular orifice with diffuse narrowing x 50% of LUL beyond lingula> adenoca - PET 11/03/14 1. The dominant left upper lobe mass and extensive adenopathy involving th  . Non-small cell carcinoma of lung, stage 4 ([place]) 11/12/2014  . Hypoalbuminemia due to protein-calorie malnutrition ([place]) 12/02/2014  . Hyponatremia 12/10/2014  . Hyperphosphatemia 12/02/2014  . Bone metastasis ([place]) 11/25/2014    Past Surgical History  Procedure Laterality Date  . Video bronchoscopy Bilateral 10/23/2014    Procedure: VIDEO BRONCHOSCOPY WITHOUT FLUORO;  Surgeon: Tanda Rockers, MD;  Location: [facility];  Service: Cardiopulmonary;   Laterality: Bilateral;    has Pulmonary infiltrates  c/w Adenoca/ Stage IV ; Non-small cell carcinoma of lung, stage 4 (HCC); Bone metastasis ([place]); Hyperglycemia; Transaminitis; Hyperphosphatemia; Peripheral edema; Rash; Dehydration; Hypoalbuminemia due to protein-calorie malnutrition ([place]); Pain in the abdomen; Hyperbilirubinemia; Abnormal transaminases; Anasarca; Leukocytosis; Hyponatremia; and Pericardial effusion with cardiac tamponade on his problem list.    is allergic to hydrocodone-acetaminophen.    Medication List       This list is accurate as of: 12/10/14  2:34 PM.  Always use your most recent med list.               docusate sodium 100 MG capsule  Commonly known as:  COLACE  Take 100 mg by mouth 2 (two) times daily.     erlotinib 150 MG tablet  Commonly known as:  TARCEVA  Take 1 tablet (150 mg total) by mouth daily. Take on an empty stomach 1 hour before meals or 2 hours after.     hyaluronate sodium Gel  Apply 1 application topically daily.     HYDROcodone-acetaminophen 5-325 MG tablet  Commonly known as:  NORCO/VICODIN  Take 1-2 tablets by mouth every 4 (four) hours as needed for moderate pain (or cough).     ibuprofen 200 MG tablet  Commonly known as:  ADVIL,MOTRIN  Take 200 mg by mouth every 6 (six) hours as needed for moderate pain.     ondansetron 8 MG tablet  Commonly known as:  ZOFRAN  Take 1 tablet (8 mg total) by mouth every 8 (eight) hours as needed  for nausea or vomiting.         PHYSICAL EXAMINATION  Oncology Vitals 12/13/2014 12/13/2014 12/13/2014 12/13/2014 12/13/2014 12/13/2014 12/13/2014  Height - - - - - - -  Weight - - - - - - 93.3 kg  Weight (lbs) - - - - - - 205 lbs 11 oz  BMI (kg/m2) - - - - - - 31.27 kg/m2  Temp 98.8 - 97.9 - 97.7 97.6 -  Pulse 103 96 - 88 - 85 -  Resp 18 21 - 28 - 25 -  SpO2 97 98 - 99 - 95 -  BSA (m2) - - - - - - 2.12 m2   BP Readings from Last 3 Encounters:  12/13/14 116/62  12/10/14 160/79  12/07/14  109/73    Physical Exam  Constitutional: He is oriented to person, place, and time.  Patient appears fatigued, weak, and uncomfortable.  HENT:  Head: Normocephalic and atraumatic.  Mouth/Throat: Oropharynx is clear and moist.  Eyes: Conjunctivae and EOM are normal. Pupils are equal, round, and reactive to light. Right eye exhibits no discharge. Left eye exhibits no discharge. No scleral icterus.  Neck: Normal range of motion. Neck supple. No JVD present. No tracheal deviation present. No thyromegaly present.  Cardiovascular: Normal rate, regular rhythm, normal heart sounds and intact distal pulses.   Pulmonary/Chest: Effort normal and breath sounds normal. No respiratory distress. He has no wheezes. He has no rales. He exhibits no tenderness.  Abdominal: He exhibits distension. He exhibits no mass. There is tenderness. There is no rebound and no guarding.  Bowel sounds hypoactive.  Generalized abdomen with tenderness with palpation and distention.  No rebound tenderness.    Musculoskeletal: Normal range of motion. He exhibits edema. He exhibits no tenderness.  Patient with bilateral peripheral edema; left slightly greater than the right.  Of note-patient did undergo a left lower extremity Doppler ultrasound last week which was negative for DVT.  Lymphadenopathy:    He has no cervical adenopathy.  Neurological: He is alert and oriented to person, place, and time.  Skin: Skin is warm and dry. Rash noted. No erythema. No pallor.  Rash to face, scalp, neck, anterior chest, and back has increased since last exam.  Psychiatric: Affect normal.  Nursing note and vitals reviewed.   LABORATORY DATA:. Admission on 12/10/2014  No results displayed because visit has over 200 results.    Appointment on 12/10/2014  Component Date Value Ref Range Status  . WBC 12/10/2014 13.3* 4.0 - 10.3 10e3/uL Final  . NEUT# 12/10/2014 11.3* 1.5 - 6.5 10e3/uL Final  . HGB 12/10/2014 10.8* 13.0 - 17.1 g/dL Final    . HCT 12/10/2014 33.1* 38.4 - 49.9 % Final  . Platelets 12/10/2014 532* 140 - 400 10e3/uL Final  . MCV 12/10/2014 85.3  79.3 - 98.0 fL Final  . MCH 12/10/2014 27.8  27.2 - 33.4 pg Final  . MCHC 12/10/2014 32.6  32.0 - 36.0 g/dL Final  . RBC 12/10/2014 3.88* 4.20 - 5.82 10e6/uL Final  . RDW 12/10/2014 16.1* 11.0 - 14.6 % Final  . lymph# 12/10/2014 0.8* 0.9 - 3.3 10e3/uL Final  . MONO# 12/10/2014 1.1* 0.1 - 0.9 10e3/uL Final  . Eosinophils Absolute 12/10/2014 0.0  0.0 - 0.5 10e3/uL Final  . Basophils Absolute 12/10/2014 0.0  0.0 - 0.1 10e3/uL Final  . NEUT% 12/10/2014 84.8* 39.0 - 75.0 % Final  . LYMPH% 12/10/2014 6.2* 14.0 - 49.0 % Final  . MONO% 12/10/2014 8.6  0.0 - 14.0 %  Final  . EOS% 12/10/2014 0.2  0.0 - 7.0 % Final  . BASO% 12/10/2014 0.2  0.0 - 2.0 % Final  . Sodium 12/10/2014 127* 136 - 145 mEq/L Final  . Potassium 12/10/2014 4.8  3.5 - 5.1 mEq/L Final  . Chloride 12/10/2014 95* 98 - 109 mEq/L Final  . CO2 12/10/2014 23  22 - 29 mEq/L Final  . Glucose 12/10/2014 289* 70 - 140 mg/dl Final   Glucose reference range is for nonfasting patients. Fasting glucose reference range is 70- 100.  Marland Kitchen BUN 12/10/2014 41.3* 7.0 - 26.0 mg/dL Final  . Creatinine 12/10/2014 1.2  0.7 - 1.3 mg/dL Final  . Total Bilirubin 12/10/2014 3.13* 0.20 - 1.20 mg/dL Final  . Alkaline Phosphatase 12/10/2014 227* 40 - 150 U/L Final  . AST 12/10/2014 179 Repeated and Verified* 5 - 34 U/L Final  . ALT 12/10/2014 540 Repeated and Verified* 0 - 55 U/L Final  . Total Protein 12/10/2014 6.5  6.4 - 8.3 g/dL Final  . Albumin 12/10/2014 3.0* 3.5 - 5.0 g/dL Final  . Calcium 12/10/2014 8.9  8.4 - 10.4 mg/dL Final  . Anion Gap 12/10/2014 8  3 - 11 mEq/L Final  . EGFR 12/10/2014 71* >90 ml/min/1.73 m2 Final   eGFR is calculated using the CKD-EPI Creatinine Equation (2009)     RADIOGRAPHIC STUDIES: Dg Chest 2 View  12/10/2014  CLINICAL DATA:  Cough and shortness of breath. Current diagnosis of lung cancer. EXAM:  CHEST  2 VIEW COMPARISON:  PET-CT dated 11/03/2014 FINDINGS: Cardiomediastinal silhouette is enlarged. Mediastinal contours is obscured by a large left perihilar mass. There are bilateral pleural effusions with associated subsegmental bibasilar atelectasis. Osseous structures are without acute abnormality. Soft tissues are grossly normal. IMPRESSION: Large left perihilar mass, consistent with known primary lung malignancy. Bilateral pleural effusions, moderate in size. Enlarged cardiac silhouette. Pericardial effusion cannot be excluded. Electronically Signed   By: Fidela Salisbury M.D.   On: 12/10/2014 15:21   Ct Head Wo Contrast  12/11/2014  CLINICAL DATA:  Rolled out of bed and hit right upper posterior head. Initial encounter. EXAM: CT HEAD WITHOUT CONTRAST TECHNIQUE: Contiguous axial images were obtained from the base of the skull through the vertex without intravenous contrast. COMPARISON:  MRI of the brain performed 11/19/2014 FINDINGS: There is no evidence of acute infarction, mass lesion, or intra- or extra-axial hemorrhage on CT. The posterior fossa, including the cerebellum, brainstem and fourth ventricle, is within normal limits. The third and lateral ventricles, and basal ganglia are unremarkable in appearance. The cerebral hemispheres are symmetric in appearance, with normal gray-white differentiation. No mass effect or midline shift is seen. There is no evidence of fracture; visualized osseous structures are unremarkable in appearance. The orbits are within normal limits. The paranasal sinuses and mastoid air cells are well-aerated. No significant soft tissue abnormalities are seen. IMPRESSION: No evidence of traumatic intracranial injury or fracture Electronically Signed   By: Garald Balding M.D.   On: 12/11/2014 05:23   Nm Hepatobiliary Liver Func  12/11/2014  CLINICAL DATA:  Right upper quadrant abdominal pain, nausea, vomiting. EXAM: NUCLEAR MEDICINE HEPATOBILIARY IMAGING TECHNIQUE:  Sequential images of the abdomen were obtained out to 60 minutes following intravenous administration of radiopharmaceutical. RADIOPHARMACEUTICALS:  7.4 mCi Tc-25m Choletec IV COMPARISON:  MRI of December 10, 2014. FINDINGS: Normal uptake within hepatic parenchyma is noted. Imaging was only carried out to 40 minutes after isotope administration as the patient had to go to emergency heart surgery. Filling of  the duodenum is noted, but no definite gallbladder filling is noted. IMPRESSION: Exam was stopped at 40 minutes after radiotracer administration as patient had to go to surgery. No definite filling of the gallbladder is noted on the available images, but given that the exam did not go 1 hour, this exam is nondiagnostic for the evaluation for cholecystitis. Electronically Signed   By: Marijo Conception, M.D.   On: 12/11/2014 14:53   Mr Abdomen Mrcp Wo Cm  12/11/2014  CLINICAL DATA:  Abdominal pain. Left upper lobe adenocarcinoma, metastatic. EXAM: MRI ABDOMEN WITHOUT CONTRAST  (INCLUDING MRCP) TECHNIQUE: Multiplanar multisequence MR imaging of the abdomen was performed. Heavily T2-weighted images of the biliary and pancreatic ducts were obtained, and three-dimensional MRCP images were rendered by post processing. COMPARISON:  11/03/2014 ; 12/10/2014 FINDINGS: Despite efforts by the technologist and patient, motion artifact is present on today's exam and could not be eliminated. This reduces exam sensitivity and specificity. Lower chest: Massive pericardial effusion, new compared to 11/03/14. Considerable bilateral pleural effusions. Hepatobiliary: Multiple T2 hyperintense liver lesions are present. This includes a 1.9 by 1.3 cm lesion in segment 2; a 2.8 by 1.8 cm lesion in segment 3; a 3.0 by 2.4 cm lesion posteriorly in segment 4A near the IVC ; and several other smaller right hepatic lobe lesions. Periportal edema is present. This is nonspecific. Attempts at at diagnostic MRCP images worth awarded by motion  artifact. There is likely some gallbladder wall thickening and suspected gallstones. No definite biliary dilatation. Pancreas: Grossly unremarkable. Spleen: Unremarkable Adrenals/Urinary Tract: Unremarkable Stomach/Bowel: Unremarkable Vascular/Lymphatic: Unremarkable Other: Upper abdominal ascites especially tracking around the spleen and in the paracolic gutters. Musculoskeletal: Unremarkable where visualized. IMPRESSION: 1. Multiple hepatic lesions measuring up to 3 cm in long axis are T2 hyperintense. On sonography these appeared echogenic. On outside CT angiogram of the chest dated 10/13/2014 there was some arterial phase enhancement associated with some of these lesions. On the nuclear medicine PET-CT from 02/15/7251, no hypermetabolic liver lesion is observed. Accordingly, the possibility is certainly raised that these lesions represent hemangiomas rather than necessarily being due to metastatic disease. Unfortunately IV contrast could not be administered to confirm this. As part of future staging workups when the patient is less acutely ill, contrast-enhanced hepatic protocol CT or MRI would be suggested. 2. Massive pericardial effusion, new compared to 11/03/2014. Correlation with Beck's triad or other signs of tamponade recommended. 3. Bilateral pleural effusions and ascites. 4. Thick-walled gallbladder, nonspecific, but cholecystitis is not excluded. Cholelithiasis. No definite biliary dilatation ; MRCP sequences or on feasible due to motion artifact. 5. Nonspecific periportal edema. These results will be called to the ordering clinician or representative by the Radiologist Assistant, and communication documented in the PACS or zVision Dashboard. Electronically Signed   By: Van Clines M.D.   On: 12/11/2014 07:53   Mr 3d Recon At Scanner  12/11/2014  CLINICAL DATA:  Abdominal pain. Left upper lobe adenocarcinoma, metastatic. EXAM: MRI ABDOMEN WITHOUT CONTRAST  (INCLUDING MRCP) TECHNIQUE:  Multiplanar multisequence MR imaging of the abdomen was performed. Heavily T2-weighted images of the biliary and pancreatic ducts were obtained, and three-dimensional MRCP images were rendered by post processing. COMPARISON:  11/03/2014 ; 12/10/2014 FINDINGS: Despite efforts by the technologist and patient, motion artifact is present on today's exam and could not be eliminated. This reduces exam sensitivity and specificity. Lower chest: Massive pericardial effusion, new compared to 11/03/14. Considerable bilateral pleural effusions. Hepatobiliary: Multiple T2 hyperintense liver lesions are present. This includes a 1.9  by 1.3 cm lesion in segment 2; a 2.8 by 1.8 cm lesion in segment 3; a 3.0 by 2.4 cm lesion posteriorly in segment 4A near the IVC ; and several other smaller right hepatic lobe lesions. Periportal edema is present. This is nonspecific. Attempts at at diagnostic MRCP images worth awarded by motion artifact. There is likely some gallbladder wall thickening and suspected gallstones. No definite biliary dilatation. Pancreas: Grossly unremarkable. Spleen: Unremarkable Adrenals/Urinary Tract: Unremarkable Stomach/Bowel: Unremarkable Vascular/Lymphatic: Unremarkable Other: Upper abdominal ascites especially tracking around the spleen and in the paracolic gutters. Musculoskeletal: Unremarkable where visualized. IMPRESSION: 1. Multiple hepatic lesions measuring up to 3 cm in long axis are T2 hyperintense. On sonography these appeared echogenic. On outside CT angiogram of the chest dated 10/13/2014 there was some arterial phase enhancement associated with some of these lesions. On the nuclear medicine PET-CT from 46/27/0350, no hypermetabolic liver lesion is observed. Accordingly, the possibility is certainly raised that these lesions represent hemangiomas rather than necessarily being due to metastatic disease. Unfortunately IV contrast could not be administered to confirm this. As part of future staging workups  when the patient is less acutely ill, contrast-enhanced hepatic protocol CT or MRI would be suggested. 2. Massive pericardial effusion, new compared to 11/03/2014. Correlation with Beck's triad or other signs of tamponade recommended. 3. Bilateral pleural effusions and ascites. 4. Thick-walled gallbladder, nonspecific, but cholecystitis is not excluded. Cholelithiasis. No definite biliary dilatation ; MRCP sequences or on feasible due to motion artifact. 5. Nonspecific periportal edema. These results will be called to the ordering clinician or representative by the Radiologist Assistant, and communication documented in the PACS or zVision Dashboard. Electronically Signed   By: Van Clines M.D.   On: 12/11/2014 07:53   US Abdomen Limited  12/10/2014  CLINICAL DATA:  Evaluate for cholecystitis/cause of hepatitis EXAM: US ABDOMEN LIMITED - RIGHT UPPER QUADRANT COMPARISON:  None. FINDINGS: Gallbladder: Multiple gallstones within the partially full gallbladder. The wall is circumferentially thickened to 6 mm. No pericholecystic edema. Sonographer reports focal tenderness. Common bile duct: Diameter: 2 mm Liver: Three echogenic masses within the liver, ranging between 16 and 24 mm in maximal diameter, without hypoechoic halo. Antegrade flow in the imaged portal venous system. Right pleural effusion, small by chest radiography. IMPRESSION: 1. At least 3 liver masses, primarily concerning for lung cancer metastases. 2. Cholelithiasis with conflicting findings for acute cholecystitis. There is gallbladder wall thickening and focal tenderness, but the gallbladder is not dilated as usually seen with cystic duct obstruction and acute cholecystitis. 3. Right pleural effusion. Electronically Signed   By: Monte Fantasia M.D.   On: 12/10/2014 16:12   Dg Chest Port 1 View  12/13/2014  CLINICAL DATA:  Pneumothorax, chest tube.  Pericardial effusion EXAM: PORTABLE CHEST 1 VIEW COMPARISON:  Radiograph 12/12/2014  FINDINGS: LEFT chest tube in place. No appreciable pneumothorax. Prominent cardiac silhouette is similar prior. LEFT upper lobe mass is difficult to define. There is bilateral pleural effusions not changed. IMPRESSION: 1. No interval change. 2. LEFT chest tube in place without pneumothorax. 3. Central venous congestion and bilateral effusions. 4. Prominent cardiac silhouette is not changed. Electronically Signed   By: Suzy Bouchard M.D.   On: 12/13/2014 08:17   Dg Chest Port 1 View  12/12/2014  CLINICAL DATA:  Pneumothorax, chest tube present. History of pericardial effusion with cardiac tamponade. Cancer. EXAM: PORTABLE CHEST 1 VIEW COMPARISON:  12/11/2014 FINDINGS: Low lung volumes. Left chest tube.  No pneumothorax is seen. Known left upper lobe  mass with lymphangitic spread in the left upper lobe. Superimposed right perihilar edema and/or infection. Additional patchy opacity in the right lower lobe, atelectasis versus pneumonia. Suspected small bilateral pleural effusions. Cardiomegaly. IMPRESSION: Left chest tube.  No pneumothorax is seen. Known left upper lobe mass with lymphangitic spread in the left upper lobe. Superimposed right perihilar edema and/or infection. Additional patchy right lower lobe opacity, atelectasis versus pneumonia. Small bilateral pleural effusions. Electronically Signed   By: Julian Hy M.D.   On: 12/12/2014 12:24   Dg Chest Port 1 View  12/11/2014  CLINICAL DATA:  Atelectasis EXAM: PORTABLE CHEST 1 VIEW COMPARISON:  12/10/2014 FINDINGS: Marked cardiopericardial enlargement. There is hazy opacity over the bilateral chest compatible with layering pleural effusions. New placement of drains over the chest post subxiphoid window. Hazy mass in the left upper chest in this patient with known adenocarcinoma. IMPRESSION: 1. Decreased cardiopericardial enlargement after pericardial effusion drainage/drain placement. 2. Lower lung volumes with bilateral layering pleural  effusion. 3. Extensive left upper lobe mass. Electronically Signed   By: Monte Fantasia M.D.   On: 12/11/2014 14:23   Entire visit with wife translating.   ASSESSMENT/PLAN:    Dehydration Patient denies feeling dehydrated today; but sodium was down to 127 and chloride was 95.  Patient was encouraged to push fluids is much as possible.  Due to other concerns regarding today's visit.-Patient will be transported to the emergency department for further evaluation and management.  Hyperbilirubinemia Bilirubin has increased to 3.13.  Patient is complaining of some increased generalized abdominal discomfort; as well, as abdominal distention.  He denies any nausea, vomiting, or diarrhea.  He has history of chronic constipation; although, he did have a small bowel movement just yesterday.  On exam.-Patient with abdomen is slightly firm and distended.  Bowel sounds decreased.  There is some mild tenderness to his entire abdomen with palpation.  There is no flank pain.  Also, alkaline phosphatase is elevated to 227; and liver enzymes are elevated with AST of 179 and ALT of 540.  Due to elevated liver enzymes and hyperbilirubinemia.-Patient will be transported to the emergency department for further evaluation and management.  Brief history.  Report was called to the emergency department charge nurse prior to patient being transported to the emergency department via wheelchair for the [place].  Hyperphosphatemia Bilirubin has increased to 3.13.  Patient is complaining of some increased generalized abdominal discomfort; as well, as abdominal distention.  He denies any nausea, vomiting, or diarrhea.  He has history of chronic constipation; although, he did have a small bowel movement just yesterday.  On exam.-Patient with abdomen is slightly firm and distended.  Bowel sounds decreased.  There is some mild tenderness to his entire abdomen with palpation.  There is no flank pain.  Also,  alkaline phosphatase is elevated to 227; and liver enzymes are elevated with AST of 179 and ALT of 540.  Due to elevated liver enzymes and hyperbilirubinemia.-Patient will be transported to the emergency department for further evaluation and management.  Brief history.  Report was called to the emergency department charge nurse prior to patient being transported to the emergency department via wheelchair for the [place].  Hypoalbuminemia due to protein-calorie malnutrition (HCC) Albumen down to 3.0 today.  Patient does have increased bilateral lower extremity peripheral edema.  He was encouraged to push protein in his diet is much as possible.  Non-small cell carcinoma of lung, stage 4 ([place]) Patient continues to take Tarceva oral therapy  as directed; and also continues with radiation treatments.  Patient is noted to have increased rash to face, scalp, neck, anterior chest and back.  Briefly discussed need to initiate clindamycin gel to rash-but will hold on prescribing clindamycin gel.  Since patient is to be transferred to the emergency department.  Patient is scheduled to return on 12/17/2014 for labs and a follow-up visit.  Peripheral edema Patient has had some mild bilateral lower extremity peripheral edema; but both patient and his wife state that his peripheral edema has increased within this past week.  Rash Rash to patient's face, scalp, neck, anterior chest, and back has increased since last exam.  The rash is secondary to the Tarceva medication.  Advised both patient and his wife would need to consider initiating clindamycin gel to rash-but will hold on prescribing any new medications since patient is being transferred to the emergency department for further evaluation and management.  Transaminitis Bilirubin has increased to 3.13.  Patient is complaining of some increased generalized abdominal discomfort; as well, as abdominal distention.  He denies any nausea, vomiting,  or diarrhea.  He has history of chronic constipation; although, he did have a small bowel movement just yesterday.  On exam.-Patient with abdomen is slightly firm and distended.  Bowel sounds decreased.  There is some mild tenderness to his entire abdomen with palpation.  There is no flank pain.  Also, alkaline phosphatase is elevated to 227; and liver enzymes are elevated with AST of 179 and ALT of 540.  Due to elevated liver enzymes and hyperbilirubinemia.-Patient will be transported to the emergency department for further evaluation and management.  Brief history.  Report was called to the emergency department charge nurse prior to patient being transported to the emergency department via wheelchair for the [place].  Patient stated understanding of all instructions; and was in agreement with this plan of care. The patient knows to call the clinic with any problems, questions or concerns.   Review/collaboration with Dr. Lindi Adie regarding all aspects of patient's visit today.   Total time spent with patient was 40 minutes;  with greater than 75 percent of that time spent in face to face counseling regarding patient's symptoms,  and coordination of care and follow up.  Disclaimer:This dictation was prepared with Dragon/digital dictation along with [employer]. Any transcriptional errors that result from this process are unintentional.  Drue Second, NP 12/13/2014

## 2014-12-13 NOTE — Assessment & Plan Note (Signed)
Patient denies feeling dehydrated today; but sodium was down to 127 and chloride was 95.  Patient was encouraged to push fluids is much as possible.  Due to other concerns regarding today's visit.-Patient will be transported to the emergency department for further evaluation and management.

## 2014-12-13 NOTE — Assessment & Plan Note (Signed)
Bilirubin has increased to 3.13.  Patient is complaining of some increased generalized abdominal discomfort; as well, as abdominal distention.  He denies any nausea, vomiting, or diarrhea.  He has history of chronic constipation; although, he did have a small bowel movement just yesterday.  On exam.-Patient with abdomen is slightly firm and distended.  Bowel sounds decreased.  There is some mild tenderness to his entire abdomen with palpation.  There is no flank pain.  Also, alkaline phosphatase is elevated to 227; and liver enzymes are elevated with AST of 179 and ALT of 540.  Due to elevated liver enzymes and hyperbilirubinemia.-Patient will be transported to the emergency department for further evaluation and management.  Brief history.  Report was called to the emergency department charge nurse prior to patient being transported to the emergency department via wheelchair for the [place].

## 2014-12-14 ENCOUNTER — Ambulatory Visit: Payer: Medicaid Other

## 2014-12-14 ENCOUNTER — Encounter (HOSPITAL_COMMUNITY): Payer: Self-pay | Admitting: Thoracic Surgery (Cardiothoracic Vascular Surgery)

## 2014-12-14 ENCOUNTER — Ambulatory Visit
Admit: 2014-12-14 | Discharge: 2014-12-14 | Disposition: A | Discharge status: Home / Self Care | Payer: Medicaid Other | Attending: Radiation Oncology | Admitting: Radiation Oncology

## 2014-12-14 ENCOUNTER — Inpatient Hospital Stay (HOSPITAL_COMMUNITY): Payer: Medicaid Other

## 2014-12-14 DIAGNOSIS — J90 Pleural effusion, not elsewhere classified: Secondary | ICD-10-CM

## 2014-12-14 DIAGNOSIS — E46 Unspecified protein-calorie malnutrition: Secondary | ICD-10-CM

## 2014-12-14 DIAGNOSIS — R609 Edema, unspecified: Secondary | ICD-10-CM

## 2014-12-14 DIAGNOSIS — D630 Anemia in neoplastic disease: Secondary | ICD-10-CM

## 2014-12-14 DIAGNOSIS — R740 Nonspecific elevation of levels of transaminase and lactic acid dehydrogenase [LDH]: Secondary | ICD-10-CM

## 2014-12-14 DIAGNOSIS — C3490 Malignant neoplasm of unspecified part of unspecified bronchus or lung: Secondary | ICD-10-CM

## 2014-12-14 LAB — CBC
HCT: 32.3 % — ABNORMAL LOW (ref 39.0–52.0)
HEMOGLOBIN: 10.2 g/dL — AB (ref 13.0–17.0)
MCH: 27.5 pg (ref 26.0–34.0)
MCHC: 31.6 g/dL (ref 30.0–36.0)
MCV: 87.1 fL (ref 78.0–100.0)
PLATELETS: 366 10*3/uL (ref 150–400)
RBC: 3.71 MIL/uL — AB (ref 4.22–5.81)
RDW: 16.5 % — ABNORMAL HIGH (ref 11.5–15.5)
WBC: 9.1 10*3/uL (ref 4.0–10.5)

## 2014-12-14 LAB — COMPREHENSIVE METABOLIC PANEL
ALK PHOS: 118 U/L (ref 38–126)
ALT: 141 U/L — AB (ref 17–63)
AST: 38 U/L (ref 15–41)
Albumin: 2.1 g/dL — ABNORMAL LOW (ref 3.5–5.0)
Anion gap: 6 (ref 5–15)
BILIRUBIN TOTAL: 1.2 mg/dL (ref 0.3–1.2)
BUN: 14 mg/dL (ref 6–20)
CALCIUM: 8.1 mg/dL — AB (ref 8.9–10.3)
CO2: 29 mmol/L (ref 22–32)
CREATININE: 0.65 mg/dL (ref 0.61–1.24)
Chloride: 103 mmol/L (ref 101–111)
GFR calc Af Amer: >60 mL/min (ref >60)
GLUCOSE: 123 mg/dL — AB (ref 65–99)
POTASSIUM: 3.8 mmol/L (ref 3.5–5.1)
Sodium: 138 mmol/L (ref 135–145)
TOTAL PROTEIN: 4.6 g/dL — AB (ref 6.5–8.1)

## 2014-12-14 LAB — GLUCOSE, CAPILLARY
GLUCOSE-CAPILLARY: 125 mg/dL — AB (ref 65–99)
Glucose-Capillary: 220 mg/dL — ABNORMAL HIGH (ref 65–99)
Glucose-Capillary: 173 mg/dL — ABNORMAL HIGH (ref 65–99)

## 2014-12-14 NOTE — Telephone Encounter (Signed)
I left a message on pt's home phone to hold tarceva and Julien Nordmann will talk to him at appt.

## 2014-12-14 NOTE — Anesthesia Postprocedure Evaluation (Signed)
Anesthesia Post Note  Patient: Steven Roberts  Procedure(s) Performed: Procedure(s) (LRB): SUBXYPHOID PERICARDIAL WINDOW (N/A) TRANSESOPHAGEAL ECHOCARDIOGRAM (TEE) (N/A)  Anesthesia type: General  Patient location: PACU  Post pain: Pain level controlled  Post assessment: Post-op Vital signs reviewed  Last Vitals: BP 102/58 mmHg  Pulse 101  Temp(Src) 36.8 C (Oral)  Resp 22  Ht '5\' 8"'$  (1.727 m)  Wt 204 lb 5.9 oz (92.7 kg)  BMI 31.08 kg/m2  SpO2 94%  Post vital signs: Reviewed  Level of consciousness: sedated  Complications: No apparent anesthesia complications

## 2014-12-14 NOTE — Progress Notes (Signed)
[facility]                                                                              Patient Demographics  Steven Roberts, is a 49 y.o. male, DOB - [birth year], GHW:[id number]  Admit date - 12/10/2014   Admitting Physician Elmarie Shiley, MD  Outpatient Primary MD for the patient is Pcp Not In System  LOS - 4   Chief Complaint  Patient presents with  . Possible biliary obstruction   . Lung Cancer  . Cough  . Abnormal Labs        Brief HPI   Per Dr. Tyrell Antonio due on 12/10/14 Steven Roberts is a 49 y.o. male Hispanic, with recent diagnosis of lung ca 10-2014, metastasis diseases to lung. He has been receiving tarceva and radiation treatments. He went to see his oncologist on the day of admission for regular follow up and was found to have abnormal labs and was refered to [facility] for further evaluation. He was having abdominal pain, epigastric,RU quadrant and supra pubic pain for 1 week. He has had poor oral intake. He reported epigastric pain with meals and early satiety. Denied vomiting, diarrhea. Had small Bowel movement yesterday. He reported Lower extremity edema and abdominal distension for 1 week. He also reported orthopnea and dyspnea on exertion.  On admission, abdominal ultrasound revealed 3 liver masses likely metastatic, cholelithiasis and right pleural effusion. HIDA is pending, and MRCP is negative for biliary dilatation. He was initiated on IV Unasyn, with cultures pending. Surgery was consulted for possible acute cholecystitis. HIDA scan was ordered but could not be completed .Other studies included 2 D echo after massive pericardial effusion was noted on the MRI abdomen. 2-D echo showed large free-flowing pericardial effusion with tamponade physiology. The patient was emergently transferred to [facility] with cardiothoracic consult. Patient underwent subxyphoid pericardial window on 10/28.  Assessment & Plan   Pericardial effusion with tamponade  physiology - MRI of the abdomen noted massive pericardial effusion, 2-D echo showed large free-flowing pericardial effusion with tamponade physiology. - Cardiothoracic surgery was consulted, patient underwent subxyphoid pericardial window on 10/28. - Highly appreciate cardiothoracic surgery assistance, management per [facility] - follow cytology  Transaminases, Hyperbilirubinemia, Abdominal Pain: This could be related to cholelithiasis,cholecystitis vs adverse effect from chemo Tarceva, vs metastasis diseases. Abdominal pain resolved - MRCP showed no biliary dilatation. HIDA scan could not be completed.  -  Hold tarceva, LFTs improving - Per general surgery, significant medical comorbidities, will not pursue gallbladder workup at this time unless the symptoms returned - Unasyn DC'd  Acute encephalopathy with blank stare spell- possibly due to tamponade  - Per documentation, patient apparently had unresponsive episode on 10/28 at [facility] with blank stare, patient did not remember. VS were normal neuro checks were normal and reactive. Possibly patient's effusion was causing the spells that he was having. -Patient recently had an MRI of the brain on 10/6 which had not shown any evidence of metastatic disease to the brain or meningitis.  - EEG consulted, patient has been stable with no recurrent symptoms  Hyponatremia; resolved Likely related to liver issues or  hyperkalemia, SIADH, urine osmolarity 757.   Lower extremity edema;  Could be related to liver failure, improving with Lasix   Lung Cancer, metastasis ; Oncology following   Diabetes mellitus: Hyperglycemia SSI.  HBA1c 8, new diagnosis of diabetes. Will place on oral hypoglycemics at the time of discharge   Code Status: full code  Family Communication: Discussed in detail with the patient, all imaging results, lab results explained to the patient and wife at the bedside  Disposition Plan:   Time Spent in minutes  20  minutes  Procedures  Pericardial window   Consults   Cardiothoracic surgery  Gen. surgery  Oncology   DVT Prophylaxis   SCD's  Medications  Scheduled Meds: . acetaminophen  1,000 mg Oral 4 times per day   Or  . acetaminophen (TYLENOL) oral liquid 160 mg/5 mL  1,000 mg Oral 4 times per day  . docusate sodium  100 mg Oral BID  . furosemide  20 mg Intravenous Once  . insulin aspart  0-9 Units Subcutaneous TID WC  . sodium chloride  3 mL Intravenous Q12H   Continuous Infusions: . 0.9 % NaCl with KCl 20 mEq / L Stopped (12/13/14 2000)   PRN Meds:.sodium chloride, morphine injection, ondansetron **OR** ondansetron (ZOFRAN) IV, oxyCODONE, potassium chloride, sodium chloride   Antibiotics   Anti-infectives    Start     Dose/Rate Route Frequency Ordered Stop   12/12/14 1200  Ampicillin-Sulbactam (UNASYN) 3 g in sodium chloride 0.9 % 100 mL IVPB  Status:  Discontinued     3 g 100 mL/hr over 60 Minutes Intravenous Every 6 hours 12/12/14 1119 12/14/14 1025   12/11/14 2100  cefUROXime (ZINACEF) 1.5 g in dextrose 5 % 50 mL IVPB     1.5 g 100 mL/hr over 30 Minutes Intravenous Every 12 hours 12/11/14 2052 12/12/14 0904   12/10/14 2200  Ampicillin-Sulbactam (UNASYN) 3 g in sodium chloride 0.9 % 100 mL IVPB  Status:  Discontinued     3 g 100 mL/hr over 60 Minutes Intravenous Every 6 hours 12/10/14 1820 12/11/14 2052   12/10/14 1715  ampicillin-sulbactam (UNASYN) 1.5 g in sodium chloride 0.9 % 50 mL IVPB     1.5 g 100 mL/hr over 30 Minutes Intravenous  Once 12/10/14 1713 12/10/14 1902        Subjective:   Steven Roberts was seen and examined today. Denies any specific complaints, sitting up in the chair in no acute issues overnight. Afebrile no abdominal pain or chest pain. Denied any dizziness, nausea, vomiting. Afebrile. No new weakness or any staring spells.     Objective:   Blood pressure 97/60, pulse 88, temperature 98 F (36.7 C), temperature source Oral, resp. rate 14,  height '5\' 8"'$  (1.727 m), weight 92.7 kg (204 lb 5.9 oz), SpO2 98 %.  Wt Readings from Last 3 Encounters:  12/14/14 92.7 kg (204 lb 5.9 oz)  12/10/14 74.753 kg (164 lb 12.8 oz)  12/10/14 74.39 kg (164 lb)     Intake/Output Summary (Last 24 hours) at 12/14/14 1025 Last data filed at 12/14/14 0731  Gross per 24 hour  Intake   1502 ml  Output   2940 ml  Net  -1438 ml    Exam  General: Alert and oriented x 3, NAD  HEENT:  PERRLA, EOMI  Neck: Supple, no JVD, no masses  CVS: S1 S2 clear, RRR  Respiratory:Decreased breath sounds at the bases   Abdomen: Soft, nontender, nondistended, + bowel sounds  Ext: no cyanosis  clubbing, 1+ edema improving  Neuro: no new deficits  Skin: No rashes  Psych: Normal affect and demeanor, alert and oriented x3    Data Review   Micro Results Recent Results (from the past 240 hour(s))  MRSA PCR Screening     Status: None   Collection Time: 12/11/14  8:44 AM  Result Value Ref Range Status   MRSA by PCR NEGATIVE NEGATIVE Final    Comment:        The GeneXpert MRSA Assay (FDA approved for NASAL specimens only), is one component of a comprehensive MRSA colonization surveillance program. It is not intended to diagnose MRSA infection nor to guide or monitor treatment for MRSA infections.   Fungus Culture with Smear     Status: None (Preliminary result)   Collection Time: 12/11/14 12:54 PM  Result Value Ref Range Status   Specimen Description FLUID PERICARDIAL  Final   Special Requests [place] B PT ON UNASYN  Final   Fungal Smear   Final    NO YEAST OR FUNGAL ELEMENTS SEEN Performed at [facility]    Culture   Final    CULTURE IN PROGRESS FOR FOUR WEEKS Performed at [facility]    Report Status PENDING  Incomplete  AFB culture with smear     Status: None (Preliminary result)   Collection Time: 12/11/14 12:54 PM  Result Value Ref Range Status   Specimen Description FLUID PERICARDIAL  Final   Special Requests SPEC  B PT ON UNASYN  Final   Acid Fast Smear   Final    NO ACID FAST BACILLI SEEN Performed at [facility]    Culture   Final    CULTURE WILL BE EXAMINED FOR 6 WEEKS BEFORE ISSUING A FINAL REPORT Performed at [facility]    Report Status PENDING  Incomplete  Culture, body fluid-bottle     Status: None (Preliminary result)   Collection Time: 12/11/14 12:54 PM  Result Value Ref Range Status   Specimen Description FLUID PERICARDIAL  Final   Special Requests NONE  Final   Culture NO GROWTH 2 DAYS  Final   Report Status PENDING  Incomplete  Gram stain     Status: None   Collection Time: 12/11/14 12:54 PM  Result Value Ref Range Status   Specimen Description FLUID PERICARDIAL  Final   Special Requests NONE  Final   Gram Stain   Final    FEW WBC PRESENT,BOTH PMN AND MONONUCLEAR NO ORGANISMS SEEN    Report Status 12/11/2014 FINAL  Final    Radiology Reports Dg Chest 2 View  12/10/2014  CLINICAL DATA:  Cough and shortness of breath. Current diagnosis of lung cancer. EXAM: CHEST  2 VIEW COMPARISON:  PET-CT dated 11/03/2014 FINDINGS: Cardiomediastinal silhouette is enlarged. Mediastinal contours is obscured by a large left perihilar mass. There are bilateral pleural effusions with associated subsegmental bibasilar atelectasis. Osseous structures are without acute abnormality. Soft tissues are grossly normal. IMPRESSION: Large left perihilar mass, consistent with known primary lung malignancy. Bilateral pleural effusions, moderate in size. Enlarged cardiac silhouette. Pericardial effusion cannot be excluded. Electronically Signed   By: Fidela Salisbury M.D.   On: 12/10/2014 15:21   Dg Abd 1 View  12/02/2014  ADDENDUM REPORT: 12/02/2014 16:24 ADDENDUM: Not mentioned above is small-moderate amount of stool in the ascending colon. No evidence of rectal fecal impaction. Electronically Signed   By: Kathreen Devoid   On: 12/02/2014 16:24  12/02/2014  CLINICAL DATA:  The  mid and low  abdominal pain EXAM: ABDOMEN - 1 VIEW COMPARISON:  PET-CT 11/03/2014 FINDINGS: The bowel gas pattern is normal. No radio-opaque calculi or other significant radiographic abnormality are seen. IMPRESSION: Negative. Electronically Signed: By: Kathreen Devoid On: 12/02/2014 15:39   Ct Head Wo Contrast  12/11/2014  CLINICAL DATA:  Rolled out of bed and hit right upper posterior head. Initial encounter. EXAM: CT HEAD WITHOUT CONTRAST TECHNIQUE: Contiguous axial images were obtained from the base of the skull through the vertex without intravenous contrast. COMPARISON:  MRI of the brain performed 11/19/2014 FINDINGS: There is no evidence of acute infarction, mass lesion, or intra- or extra-axial hemorrhage on CT. The posterior fossa, including the cerebellum, brainstem and fourth ventricle, is within normal limits. The third and lateral ventricles, and basal ganglia are unremarkable in appearance. The cerebral hemispheres are symmetric in appearance, with normal gray-white differentiation. No mass effect or midline shift is seen. There is no evidence of fracture; visualized osseous structures are unremarkable in appearance. The orbits are within normal limits. The paranasal sinuses and mastoid air cells are well-aerated. No significant soft tissue abnormalities are seen. IMPRESSION: No evidence of traumatic intracranial injury or fracture Electronically Signed   By: Garald Balding M.D.   On: 12/11/2014 05:23   Mr Jeri Cos AS Contrast  11/19/2014  CLINICAL DATA:  Stage IV non-small cell lung cancer. Left-sided headaches for 3 months. EXAM: MRI HEAD WITHOUT AND WITH CONTRAST TECHNIQUE: Multiplanar, multiecho pulse sequences of the brain and surrounding structures were obtained without and with intravenous contrast. CONTRAST:  17 mL MultiHance. COMPARISON:  None. FINDINGS: No acute infarct, hemorrhage, or mass lesion is present. The ventricles are of normal size. No significant extraaxial fluid collection is present. No  significant white matter changes are present. The internal auditory canals and brainstem are normal. Flow is present in the major intracranial arteries. The globes orbits are intact. Mild mucosal thickening is present in the maxillary sinuses and ethmoid air cells bilaterally. There is minimal mucosal thickening in the right frontal sinus. The sphenoid sinuses and mastoid air cells are clear. Skullbase is within normal limits.  Midline images are unremarkable. Postcontrast images demonstrate no pathologic enhancement to suggest metastatic disease the brain or meninges. Linear enhancement in the right temporal lobe is compatible with a developmental venous anomaly. IMPRESSION: 1. No evidence for metastatic disease to the brain or meninges. 2. 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 3. Minimal sinus disease. Electronically Signed   By: San Morelle M.D.   On: 11/19/2014 09:03   Nm Hepatobiliary Liver Func  12/11/2014  CLINICAL DATA:  Right upper quadrant abdominal pain, nausea, vomiting. EXAM: NUCLEAR MEDICINE HEPATOBILIARY IMAGING TECHNIQUE: Sequential images of the abdomen were obtained out to 60 minutes following intravenous administration of radiopharmaceutical. RADIOPHARMACEUTICALS:  7.4 mCi Tc-70m Choletec IV COMPARISON:  MRI of December 10, 2014. FINDINGS: Normal uptake within hepatic parenchyma is noted. Imaging was only carried out to 40 minutes after isotope administration as the patient had to go to emergency heart surgery. Filling of the duodenum is noted, but no definite gallbladder filling is noted. IMPRESSION: Exam was stopped at 40 minutes after radiotracer administration as patient had to go to surgery. No definite filling of the gallbladder is noted on the available images, but given that the exam did not go 1 hour, this exam is nondiagnostic for the evaluation for cholecystitis. Electronically Signed   By: JMarijo Conception M.D.   On: 12/11/2014 14:53  Mr  Abdomen Mrcp Wo Cm  12/11/2014  CLINICAL DATA:  Abdominal pain. Left upper lobe adenocarcinoma, metastatic. EXAM: MRI ABDOMEN WITHOUT CONTRAST  (INCLUDING MRCP) TECHNIQUE: Multiplanar multisequence MR imaging of the abdomen was performed. Heavily T2-weighted images of the biliary and pancreatic ducts were obtained, and three-dimensional MRCP images were rendered by post processing. COMPARISON:  11/03/2014 ; 12/10/2014 FINDINGS: Despite efforts by the technologist and patient, motion artifact is present on today's exam and could not be eliminated. This reduces exam sensitivity and specificity. Lower chest: Massive pericardial effusion, new compared to 11/03/14. Considerable bilateral pleural effusions. Hepatobiliary: Multiple T2 hyperintense liver lesions are present. This includes a 1.9 by 1.3 cm lesion in segment 2; a 2.8 by 1.8 cm lesion in segment 3; a 3.0 by 2.4 cm lesion posteriorly in segment 4A near the IVC ; and several other smaller right hepatic lobe lesions. Periportal edema is present. This is nonspecific. Attempts at at diagnostic MRCP images worth awarded by motion artifact. There is likely some gallbladder wall thickening and suspected gallstones. No definite biliary dilatation. Pancreas: Grossly unremarkable. Spleen: Unremarkable Adrenals/Urinary Tract: Unremarkable Stomach/Bowel: Unremarkable Vascular/Lymphatic: Unremarkable Other: Upper abdominal ascites especially tracking around the spleen and in the paracolic gutters. Musculoskeletal: Unremarkable where visualized. IMPRESSION: 1. Multiple hepatic lesions measuring up to 3 cm in long axis are T2 hyperintense. On sonography these appeared echogenic. On outside CT angiogram of the chest dated 10/13/2014 there was some arterial phase enhancement associated with some of these lesions. On the nuclear medicine PET-CT from 70/35/0093, no hypermetabolic liver lesion is observed. Accordingly, the possibility is certainly raised that these lesions  represent hemangiomas rather than necessarily being due to metastatic disease. Unfortunately IV contrast could not be administered to confirm this. As part of future staging workups when the patient is less acutely ill, contrast-enhanced hepatic protocol CT or MRI would be suggested. 2. Massive pericardial effusion, new compared to 11/03/2014. Correlation with Beck's triad or other signs of tamponade recommended. 3. Bilateral pleural effusions and ascites. 4. Thick-walled gallbladder, nonspecific, but cholecystitis is not excluded. Cholelithiasis. No definite biliary dilatation ; MRCP sequences or on feasible due to motion artifact. 5. Nonspecific periportal edema. These results will be called to the ordering clinician or representative by the Radiologist Assistant, and communication documented in the PACS or zVision Dashboard. Electronically Signed   By: Van Clines M.D.   On: 12/11/2014 07:53   Mr 3d Recon At Scanner  12/11/2014  CLINICAL DATA:  Abdominal pain. Left upper lobe adenocarcinoma, metastatic. EXAM: MRI ABDOMEN WITHOUT CONTRAST  (INCLUDING MRCP) TECHNIQUE: Multiplanar multisequence MR imaging of the abdomen was performed. Heavily T2-weighted images of the biliary and pancreatic ducts were obtained, and three-dimensional MRCP images were rendered by post processing. COMPARISON:  11/03/2014 ; 12/10/2014 FINDINGS: Despite efforts by the technologist and patient, motion artifact is present on today's exam and could not be eliminated. This reduces exam sensitivity and specificity. Lower chest: Massive pericardial effusion, new compared to 11/03/14. Considerable bilateral pleural effusions. Hepatobiliary: Multiple T2 hyperintense liver lesions are present. This includes a 1.9 by 1.3 cm lesion in segment 2; a 2.8 by 1.8 cm lesion in segment 3; a 3.0 by 2.4 cm lesion posteriorly in segment 4A near the IVC ; and several other smaller right hepatic lobe lesions. Periportal edema is present. This is  nonspecific. Attempts at at diagnostic MRCP images worth awarded by motion artifact. There is likely some gallbladder wall thickening and suspected gallstones. No definite biliary dilatation. Pancreas: Grossly unremarkable. Spleen: Unremarkable  Adrenals/Urinary Tract: Unremarkable Stomach/Bowel: Unremarkable Vascular/Lymphatic: Unremarkable Other: Upper abdominal ascites especially tracking around the spleen and in the paracolic gutters. Musculoskeletal: Unremarkable where visualized. IMPRESSION: 1. Multiple hepatic lesions measuring up to 3 cm in long axis are T2 hyperintense. On sonography these appeared echogenic. On outside CT angiogram of the chest dated 10/13/2014 there was some arterial phase enhancement associated with some of these lesions. On the nuclear medicine PET-CT from 53/97/6734, no hypermetabolic liver lesion is observed. Accordingly, the possibility is certainly raised that these lesions represent hemangiomas rather than necessarily being due to metastatic disease. Unfortunately IV contrast could not be administered to confirm this. As part of future staging workups when the patient is less acutely ill, contrast-enhanced hepatic protocol CT or MRI would be suggested. 2. Massive pericardial effusion, new compared to 11/03/2014. Correlation with Beck's triad or other signs of tamponade recommended. 3. Bilateral pleural effusions and ascites. 4. Thick-walled gallbladder, nonspecific, but cholecystitis is not excluded. Cholelithiasis. No definite biliary dilatation ; MRCP sequences or on feasible due to motion artifact. 5. Nonspecific periportal edema. These results will be called to the ordering clinician or representative by the Radiologist Assistant, and communication documented in the PACS or zVision Dashboard. Electronically Signed   By: Van Clines M.D.   On: 12/11/2014 07:53   US Abdomen Limited  12/10/2014  CLINICAL DATA:  Evaluate for cholecystitis/cause of hepatitis EXAM: US  ABDOMEN LIMITED - RIGHT UPPER QUADRANT COMPARISON:  None. FINDINGS: Gallbladder: Multiple gallstones within the partially full gallbladder. The wall is circumferentially thickened to 6 mm. No pericholecystic edema. Sonographer reports focal tenderness. Common bile duct: Diameter: 2 mm Liver: Three echogenic masses within the liver, ranging between 16 and 24 mm in maximal diameter, without hypoechoic halo. Antegrade flow in the imaged portal venous system. Right pleural effusion, small by chest radiography. IMPRESSION: 1. At least 3 liver masses, primarily concerning for lung cancer metastases. 2. Cholelithiasis with conflicting findings for acute cholecystitis. There is gallbladder wall thickening and focal tenderness, but the gallbladder is not dilated as usually seen with cystic duct obstruction and acute cholecystitis. 3. Right pleural effusion. Electronically Signed   By: Monte Fantasia M.D.   On: 12/10/2014 16:12   Dg Chest Port 1 View  12/14/2014  CLINICAL DATA:  Pericardial effusion. EXAM: PORTABLE CHEST 1 VIEW COMPARISON:  12/13/2014 . FINDINGS: Left chest tube in stable position. Persistent cardiomegaly with bilateral pulmonary alveolar infiltrates consistent with congestive heart failure and pulmonary edema. Small pleural effusions are noted. No definite pneumothorax . No acute bony abnormality . IMPRESSION: 1. Persistent cardiomegaly and bilateral pulmonary alveolar infiltrates consistent with congestive heart failure. Small bilateral pleural effusions again noted . Persistent low lung volumes. 2. Left chest tube in stable position. No definite pneumothorax noted. Electronically Signed   By: Dalton   On: 12/14/2014 07:43   Dg Chest Port 1 View  12/13/2014  CLINICAL DATA:  Pneumothorax, chest tube.  Pericardial effusion EXAM: PORTABLE CHEST 1 VIEW COMPARISON:  Radiograph 12/12/2014 FINDINGS: LEFT chest tube in place. No appreciable pneumothorax. Prominent cardiac silhouette is similar  prior. LEFT upper lobe mass is difficult to define. There is bilateral pleural effusions not changed. IMPRESSION: 1. No interval change. 2. LEFT chest tube in place without pneumothorax. 3. Central venous congestion and bilateral effusions. 4. Prominent cardiac silhouette is not changed. Electronically Signed   By: Suzy Bouchard M.D.   On: 12/13/2014 08:17   Dg Chest Port 1 View  12/12/2014  CLINICAL DATA:  Pneumothorax, chest  tube present. History of pericardial effusion with cardiac tamponade. Cancer. EXAM: PORTABLE CHEST 1 VIEW COMPARISON:  12/11/2014 FINDINGS: Low lung volumes. Left chest tube.  No pneumothorax is seen. Known left upper lobe mass with lymphangitic spread in the left upper lobe. Superimposed right perihilar edema and/or infection. Additional patchy opacity in the right lower lobe, atelectasis versus pneumonia. Suspected small bilateral pleural effusions. Cardiomegaly. IMPRESSION: Left chest tube.  No pneumothorax is seen. Known left upper lobe mass with lymphangitic spread in the left upper lobe. Superimposed right perihilar edema and/or infection. Additional patchy right lower lobe opacity, atelectasis versus pneumonia. Small bilateral pleural effusions. Electronically Signed   By: Julian Hy M.D.   On: 12/12/2014 12:24   Dg Chest Port 1 View  12/11/2014  CLINICAL DATA:  Atelectasis EXAM: PORTABLE CHEST 1 VIEW COMPARISON:  12/10/2014 FINDINGS: Marked cardiopericardial enlargement. There is hazy opacity over the bilateral chest compatible with layering pleural effusions. New placement of drains over the chest post subxiphoid window. Hazy mass in the left upper chest in this patient with known adenocarcinoma. IMPRESSION: 1. Decreased cardiopericardial enlargement after pericardial effusion drainage/drain placement. 2. Lower lung volumes with bilateral layering pleural effusion. 3. Extensive left upper lobe mass. Electronically Signed   By: Monte Fantasia M.D.   On: 12/11/2014  14:23    CBC  Recent Labs Lab 12/10/14 1017 12/10/14 1508 12/11/14 0537 12/12/14 0546 12/13/14 0435 12/14/14 0325  WBC 13.3* 13.9* 12.7* 10.8* 10.7* 9.1  HGB 10.8* 11.1* 10.5* 10.3* 10.0* 10.2*  HCT 33.1* 33.7* 32.3* 31.2* 32.1* 32.3*  PLT 532* 546* 516* 401* 325 366  MCV 85.3 84.9 85.9 85.7 87.0 87.1  MCH 27.8 28.0 27.9 28.3 27.1 27.5  MCHC 32.6 32.9 32.5 33.0 31.2 31.6  RDW 16.1* 15.8* 16.2* 16.3* 16.5* 16.5*  LYMPHSABS 0.8* 1.0  --   --   --   --   MONOABS 1.1* 1.3*  --   --   --   --   EOSABS 0.0 0.0  --   --   --   --   BASOSABS 0.0 0.0  --   --   --   --     Chemistries   Recent Labs Lab 12/10/14 1017 12/10/14 1508 12/11/14 0537 12/12/14 0546 12/13/14 0435 12/14/14 0325  NA 127* 126* 129* 127* 129* 138  K 4.8 4.6 4.3 4.5 4.4 3.8  CL  --  93* 97* 96* 97* 103  CO2 23 23 21* '26 24 29  '$ GLUCOSE 289* 231* 222* 212* 152* 123*  BUN 41.3* 42* 37* 23* 18 14  CREATININE 1.2 1.11 1.04 0.84 0.72 0.65  CALCIUM 8.9 8.7* 8.7* 8.2* 8.0* 8.1*  AST 179 Repeated and Verified* 169* 117*  --  40 38  ALT 540 Repeated and Verified* 505* 422*  --  193* 141*  ALKPHOS 227* 218* 192*  --  128* 118  BILITOT 3.13* 3.3* 3.2*  --  2.2* 1.2   ------------------------------------------------------------------------------------------------------------------ estimated creatinine clearance is 126.1 mL/min (by C-G formula based on Cr of 0.65). ------------------------------------------------------------------------------------------------------------------ No results for input(s): HGBA1C in the last 72 hours. ------------------------------------------------------------------------------------------------------------------ No results for input(s): CHOL, HDL, LDLCALC, TRIG, CHOLHDL, LDLDIRECT in the last 72 hours. ------------------------------------------------------------------------------------------------------------------ No results for input(s): TSH, T4TOTAL, T3FREE, THYROIDAB in the  last 72 hours.  Invalid input(s): FREET3 ------------------------------------------------------------------------------------------------------------------ No results for input(s): VITAMINB12, FOLATE, FERRITIN, TIBC, IRON, RETICCTPCT in the last 72 hours.  Coagulation profile  Recent Labs Lab 12/10/14 1508  INR 1.46    No results  for input(s): DDIMER in the last 72 hours.  Cardiac Enzymes No results for input(s): CKMB, TROPONINI, MYOGLOBIN in the last 168 hours.  Invalid input(s): CK ------------------------------------------------------------------------------------------------------------------ Invalid input(s): Rhea  12/12/14 2205 12/13/14 0821 12/13/14 1107 12/13/14 1709 12/13/14 2125 12/14/14 0806  GLUCAP 192* 135* 166* 178* [place]     Courtnei Ruddell M.D. [facility] 12/14/2014, 10:25 AM  Pager: [phone] Between 7am to 7pm - call Pager - [phone]  After 7pm go to [email or web address] - password [email or web address]  Call night coverage person covering after 7pm

## 2014-12-14 NOTE — Progress Notes (Signed)
Steven Roberts   DOB:[birth year]   DP#:[id number]   WER#:[id number]  INPATIENT PROGRESS NOTE  Patient Care Team: Pcp Not In System as PCP - General (Emergency Medicine)  Subjective: Feeling better today, after pericardial window. No new issues overnight. Denies worsening shortness of breath, chest pain, fever or chills. Chest tube functioning well with minimal drainage.  Incisional pain controlled with meds. Denies abdominal pain, nausea or vomiting. Denies diarrhea or constipation. Denies any bleeding issues. Abulating in the mornings with assistance  Scheduled Meds: . acetaminophen  1,000 mg Oral 4 times per day   Or  . acetaminophen (TYLENOL) oral liquid 160 mg/5 mL  1,000 mg Oral 4 times per day  . docusate sodium  100 mg Oral BID  . insulin aspart  0-9 Units Subcutaneous TID WC  . sodium chloride  3 mL Intravenous Q12H   Continuous Infusions: . 0.9 % NaCl with KCl 20 mEq / L Stopped (12/13/14 2000)   PRN Meds:.sodium chloride, morphine injection, ondansetron **OR** ondansetron (ZOFRAN) IV, oxyCODONE, potassium chloride, sodium chloride  Objective:  Filed Vitals:   12/14/14 1145  BP: 107/61  Pulse: 97  Temp:   Resp: 14      Intake/Output Summary (Last 24 hours) at 12/14/14 1228 Last data filed at 12/14/14 1144  Gross per 24 hour  Intake 1349.5 ml  Output   3740 ml  Net -2390.5 ml    ECOG PERFORMANCE STATUS: 2  GENERAL:alert, ill appearing, no acute distress EYES: normal, conjunctiva are pink and non-injected, sclera clear OROPHARYNX:no exudate, no erythema and lips, buccal mucosa, and tongue normal  LUNGS: decreased breath sounds at the bases. Left chest tube with minimal drainage. HEART: regular rate & rhythm and no murmurs and 1+ lower extremity edema ABDOMEN: soft, minimal RUQ tenderness, and normal bowel sounds Musculoskeletal:no cyanosis of digits and no clubbing  NEURO: no focal motor/sensory deficits.      CBG (last 3)   Recent Labs  12/13/14 2125  12/14/14 0806 12/14/14 1140  GLUCAP 192* 125* 220*     Labs:   Recent Labs Lab 12/10/14 1017 12/10/14 1508 12/11/14 0537 12/12/14 0546 12/13/14 0435 12/14/14 0325  WBC 13.3* 13.9* 12.7* 10.8* 10.7* 9.1  HGB 10.8* 11.1* 10.5* 10.3* 10.0* 10.2*  HCT 33.1* 33.7* 32.3* 31.2* 32.1* 32.3*  PLT 532* 546* 516* 401* 325 366  MCV 85.3 84.9 85.9 85.7 87.0 87.1  MCH 27.8 28.0 27.9 28.3 27.1 27.5  MCHC 32.6 32.9 32.5 33.0 31.2 31.6  RDW 16.1* 15.8* 16.2* 16.3* 16.5* 16.5*  LYMPHSABS 0.8* 1.0  --   --   --   --   MONOABS 1.1* 1.3*  --   --   --   --   EOSABS 0.0 0.0  --   --   --   --   BASOSABS 0.0 0.0  --   --   --   --      Chemistries:    Recent Labs Lab 12/10/14 1017 12/10/14 1508 12/11/14 0537 12/12/14 0546 12/13/14 0435 12/14/14 0325  NA 127* 126* 129* 127* 129* 138  K 4.8 4.6 4.3 4.5 4.4 3.8  CL  --  93* 97* 96* 97* 103  CO2 23 23 21* 26 24 29   GLUCOSE 289* 231* 222* 212* 152* 123*  BUN 41.3* 42* 37* 23* 18 14  CREATININE 1.2 1.11 1.04 0.84 0.72 0.65  CALCIUM 8.9 8.7* 8.7* 8.2* 8.0* 8.1*  AST 179 Repeated and Verified* 169* 117*  --  40 38  ALT  540 Repeated and Verified* 505* 422*  --  193* 141*  ALKPHOS 227* 218* 192*  --  128* 118  BILITOT 3.13* 3.3* 3.2*  --  2.2* 1.2    GFR Estimated Creatinine Clearance: 126.1 mL/min (by C-G formula based on Cr of 0.65).  Liver Function Tests:  Recent Labs Lab 12/10/14 1017 12/10/14 1508 12/11/14 0537 12/13/14 0435 12/14/14 0325  AST 179 Repeated and Verified* 169* 117* 40 38  ALT 540 Repeated and Verified* 505* 422* 193* 141*  ALKPHOS 227* 218* 192* 128* 118  BILITOT 3.13* 3.3* 3.2* 2.2* 1.2  PROT 6.5 7.3 6.6 4.7* 4.6*  ALBUMIN 3.0* 3.4* 3.0* 2.1* 2.1*    Recent Labs Lab 12/10/14 1508  LIPASE 51    Recent Labs Lab 12/11/14 0750  AMMONIA 36*    Urine Studies     Component Value Date/Time   COLORURINE AMBER* 12/10/2014 1553   APPEARANCEUR CLOUDY* 12/10/2014 1553   LABSPEC 1.022 12/10/2014  1553   PHURINE 6.0 12/10/2014 1553   GLUCOSEU NEGATIVE 12/10/2014 1553   HGBUR TRACE* 12/10/2014 1553   BILIRUBINUR NEGATIVE 12/10/2014 1553   KETONESUR NEGATIVE 12/10/2014 1553   PROTEINUR 100* 12/10/2014 1553   UROBILINOGEN 0.2 12/10/2014 1553   NITRITE NEGATIVE 12/10/2014 1553   LEUKOCYTESUR NEGATIVE 12/10/2014 1553    Coagulation profile  Recent Labs Lab 12/10/14 1508  INR 1.46    CBG:  Recent Labs Lab 12/13/14 1107 12/13/14 1709 12/13/14 2125 12/14/14 0806 12/14/14 1140  GLUCAP 166* 178* 192* 125* 220*     Imaging Studies:  Dg Chest Port 1 View  12/14/2014  CLINICAL DATA:  Pericardial effusion. EXAM: PORTABLE CHEST 1 VIEW COMPARISON:  12/13/2014 . FINDINGS: Left chest tube in stable position. Persistent cardiomegaly with bilateral pulmonary alveolar infiltrates consistent with congestive heart failure and pulmonary edema. Small pleural effusions are noted. No definite pneumothorax . No acute bony abnormality . IMPRESSION: 1. Persistent cardiomegaly and bilateral pulmonary alveolar infiltrates consistent with congestive heart failure. Small bilateral pleural effusions again noted . Persistent low lung volumes. 2. Left chest tube in stable position. No definite pneumothorax noted. Electronically Signed   By: [place]   On: 12/14/2014 07:43   Dg Chest Port 1 View  12/13/2014  CLINICAL DATA:  Pneumothorax, chest tube.  Pericardial effusion EXAM: PORTABLE CHEST 1 VIEW COMPARISON:  Radiograph 12/12/2014 FINDINGS: LEFT chest tube in place. No appreciable pneumothorax. Prominent cardiac silhouette is similar prior. LEFT upper lobe mass is difficult to define. There is bilateral pleural effusions not changed. IMPRESSION: 1. No interval change. 2. LEFT chest tube in place without pneumothorax. 3. Central venous congestion and bilateral effusions. 4. Prominent cardiac silhouette is not changed. Electronically Signed   By: Suzy Bouchard M.D.   On: 12/13/2014 08:17     Assessment/Plan: 49 y.o.   Metastatic non-small cell lung cancer, adenocarcinoma with positive EGFR mutation. The patient was started on treatment with targeted therapy with Tarceva 150 mg by mouth daily on 11/26/2014 with concurrent radiation.  MRI abdomen shows suspicious liver lesions. Tarceva is on hold due to current hospitalization for elevated transaminases, will likely resume when normalized Right Hip and Left Rib Mets being radiated. Now on hold. He may be able to resume after he stabilizes  Elevated Transaminases Hyperbilirubinemia Likely due to Tarceva, currently on hold Transaminases are slowly improving  Abdominal Ultrasound was equivocal for cholecystitis.  MRCP is negative for biliary dilatation  Hida could not be completed Surgical team is not to pursue further cholecystitis workup  unless clinical status improves Appreciate all the care provided by the Hospitalist team and specialties  Hyponatremia, resolved In the setting of malignancy and hypovolemia Resolved.  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, malnutrition,  No transfusion is indicated at this time Monitor counts closely Transfuse blood to maintain a Hb of 8 g or if the patient is acutely bleeding  Leukocytosis, resolved This is likely reactive No intervention is indicated at this time WBCs slowly normalized IV Unasyn discontinued Will continue to monitor  Acute mental status changes, resolved In the  setting of malignancy, liver failure This has resolved  Malnutrition Appreciate Nutrition evaluation  DVT prophylaxis On SCDs  Full Code   Other medical issues as per admitting team     Rondel Jumbo, PA-C 12/14/2014  12:28 PM  ADDENDUM: Hematology/Oncology Attending: The patient is seen and examined today. I agree with the above note. This is a very pleasant 49 years old Hispanic male with recently diagnosed with a stage IV non-small cell lung cancer, adenocarcinoma with positive EGFR mutation. The patient was started recently on treatment with Tarceva 150 mg by mouth daily and has been tolerating his treatment fairly well except for the skin rash. He was admitted to [facility] with significant dyspnea and chest pain as well as elevated liver enzymes. During this admission he was found to have large pericardial and pleural effusion. He underwent pericardial window with drainage of 1500 ML of hemorrhagic pericardial effusion. The patient feels much better today. His treatment with Tarceva is currently on hold and there is improvement in his liver function. I recommended for the patient to hold his treatment with Tarceva for now. I will see him back for follow-up visit in one week in the office for further evaluation and recommendation regarding further treatment of his lung cancer. Thank you so much for taking good care of Mr. Holub. Please call if you have any questions.  Disclaimer: This note was dictated with voice recognition software. Similar sounding words can inadvertently be transcribed and may be missed upon review.

## 2014-12-14 NOTE — Progress Notes (Addendum)
[address]       [place] [address]             [phone]      3 Days Post-Op Procedure(s) (LRB): SUBXYPHOID PERICARDIAL WINDOW (N/A) TRANSESOPHAGEAL ECHOCARDIOGRAM (TEE) (N/A) Subjective: comfortable  Objective: Vital signs in last 24 hours: Temp:  [97.7 F (36.5 C)-98.8 F (37.1 C)] 98 F (36.7 C) (10/31 0807) Pulse Rate:  [88-103] 88 (10/31 0755) Cardiac Rhythm:  [-] Normal sinus rhythm (10/31 0755) Resp:  [14-28] 14 (10/31 0755) BP: (97-117)/(55-66) 97/60 mmHg (10/31 0755) SpO2:  [94 %-99 %] 98 % (10/31 0755) Weight:  [204 lb 5.9 oz (92.7 kg)] 204 lb 5.9 oz (92.7 kg) (10/31 0500)  Hemodynamic parameters for last 24 hours:    Intake/Output from previous day: 10/30 0701 - 10/31 0700 In: 2182 [P.O.:1440; I.V.:342; IV Piggyback:400] Out: 3450 [Urine:3050; Chest Tube:400] Intake/Output this shift: Total I/O In: -  Out: 90 [Chest Tube:90]  General appearance: alert, cooperative and no distress Heart: regular rate and rhythm Lungs: dim in lower fields bilat  Lab Results:  Recent Labs  12/13/14 0435 12/14/14 0325  WBC 10.7* 9.1  HGB 10.0* 10.2*  HCT 32.1* 32.3*  PLT 325 366   BMET:  Recent Labs  12/13/14 0435 12/14/14 0325  NA 129* 138  K 4.4 3.8  CL 97* 103  CO2 24 29  GLUCOSE 152* 123*  BUN 18 14  CREATININE 0.72 0.65  CALCIUM 8.0* 8.1*    PT/INR: No results for input(s): LABPROT, INR in the last 72 hours. ABG    Component Value Date/Time   PHART 7.461* 12/12/2014 0529   HCO3 20.6 12/12/2014 0529   TCO2 21.5 12/12/2014 0529   ACIDBASEDEF 2.6* 12/12/2014 0529   O2SAT 99.3 12/12/2014 0529   CBG (last 3)   Recent Labs  12/13/14 1709 12/13/14 2125 12/14/14 0806  GLUCAP 178* 192* 125*    Meds Scheduled Meds: . acetaminophen  1,000 mg Oral 4 times per day   Or  . acetaminophen (TYLENOL) oral liquid 160 mg/5 mL  1,000 mg Oral 4 times per day  . ampicillin-sulbactam (UNASYN) IV  3 g Intravenous Q6H  .  docusate sodium  100 mg Oral BID  . furosemide  20 mg Intravenous Once  . insulin aspart  0-9 Units Subcutaneous TID WC  . sodium chloride  3 mL Intravenous Q12H   Continuous Infusions: . 0.9 % NaCl with KCl 20 mEq / L Stopped (12/13/14 2000)   PRN Meds:.sodium chloride, morphine injection, ondansetron **OR** ondansetron (ZOFRAN) IV, oxyCODONE, potassium chloride, sodium chloride  Xrays Dg Chest Port 1 View  12/14/2014  CLINICAL DATA:  Pericardial effusion. EXAM: PORTABLE CHEST 1 VIEW COMPARISON:  12/13/2014 . FINDINGS: Left chest tube in stable position. Persistent cardiomegaly with bilateral pulmonary alveolar infiltrates consistent with congestive heart failure and pulmonary edema. Small pleural effusions are noted. No definite pneumothorax . No acute bony abnormality . IMPRESSION: 1. Persistent cardiomegaly and bilateral pulmonary alveolar infiltrates consistent with congestive heart failure. Small bilateral pleural effusions again noted . Persistent low lung volumes. 2. Left chest tube in stable position. No definite pneumothorax noted. Electronically Signed   By: [place]   On: 12/14/2014 07:43   Dg Chest Port 1 View  12/13/2014  CLINICAL DATA:  Pneumothorax, chest tube.  Pericardial effusion EXAM: PORTABLE CHEST 1 VIEW COMPARISON:  Radiograph 12/12/2014 FINDINGS: LEFT chest tube in place. No appreciable pneumothorax. Prominent cardiac silhouette is similar prior. LEFT  upper lobe mass is difficult to define. There is bilateral pleural effusions not changed. IMPRESSION: 1. No interval change. 2. LEFT chest tube in place without pneumothorax. 3. Central venous congestion and bilateral effusions. 4. Prominent cardiac silhouette is not changed. Electronically Signed   By: Suzy Bouchard M.D.   On: 12/13/2014 08:17   Dg Chest Port 1 View  12/12/2014  CLINICAL DATA:  Pneumothorax, chest tube present. History of pericardial effusion with cardiac tamponade. Cancer. EXAM: PORTABLE CHEST 1  VIEW COMPARISON:  12/11/2014 FINDINGS: Low lung volumes. Left chest tube.  No pneumothorax is seen. Known left upper lobe mass with lymphangitic spread in the left upper lobe. Superimposed right perihilar edema and/or infection. Additional patchy opacity in the right lower lobe, atelectasis versus pneumonia. Suspected small bilateral pleural effusions. Cardiomegaly. IMPRESSION: Left chest tube.  No pneumothorax is seen. Known left upper lobe mass with lymphangitic spread in the left upper lobe. Superimposed right perihilar edema and/or infection. Additional patchy right lower lobe opacity, atelectasis versus pneumonia. Small bilateral pleural effusions. Electronically Signed   By: Julian Hy M.D.   On: 12/12/2014 12:24    Assessment/Plan: S/P Procedure(s) (LRB): SUBXYPHOID PERICARDIAL WINDOW (N/A) TRANSESOPHAGEAL ECHOCARDIOGRAM (TEE) (N/A)  1 feels like breathing is fairly comfortable 2 still moderate drainage, 450 out yesterday and 190 cc so far today, no air leak, keep CT's for now 3 medical management per primary svc  LOS: 4 days    GOLD,WAYNE E 12/14/2014  I have seen and examined the patient and agree with the assessment and plan as outlined.  There are no indications for continued treatment with intravenous antibiotics.  The patient's abdominal pain on presentation was due to his pericardial effusion and has resolved.  There is no evidence for intraabdominal source of infection and General Surgery has signed off.  Continued administration of Unasyn could be associated with numerous associated side effects and/or complications.  I would encourage stopping antibiotics.  Rexene Alberts, MD 12/14/2014 10:03 AM

## 2014-12-15 ENCOUNTER — Ambulatory Visit
Admit: 2014-12-15 | Discharge: 2014-12-15 | Disposition: A | Discharge status: Home / Self Care | Payer: Medicaid Other | Attending: Radiation Oncology | Admitting: Radiation Oncology

## 2014-12-15 ENCOUNTER — Ambulatory Visit: Payer: Medicaid Other

## 2014-12-15 ENCOUNTER — Inpatient Hospital Stay (HOSPITAL_COMMUNITY): Payer: Medicaid Other

## 2014-12-15 LAB — COMPREHENSIVE METABOLIC PANEL
ALBUMIN: 2 g/dL — AB (ref 3.5–5.0)
ALK PHOS: 124 U/L (ref 38–126)
ALT: 113 U/L — ABNORMAL HIGH (ref 17–63)
ANION GAP: 4 — AB (ref 5–15)
AST: 36 U/L (ref 15–41)
BILIRUBIN TOTAL: 1 mg/dL (ref 0.3–1.2)
BUN: 8 mg/dL (ref 6–20)
CALCIUM: 8.2 mg/dL — AB (ref 8.9–10.3)
CO2: 32 mmol/L (ref 22–32)
Chloride: 101 mmol/L (ref 101–111)
Creatinine, Ser: 0.56 mg/dL — ABNORMAL LOW (ref 0.61–1.24)
GFR calc Af Amer: >60 mL/min (ref >60)
GLUCOSE: 118 mg/dL — AB (ref 65–99)
Potassium: 3.8 mmol/L (ref 3.5–5.1)
Sodium: 137 mmol/L (ref 135–145)
TOTAL PROTEIN: 4.4 g/dL — AB (ref 6.5–8.1)

## 2014-12-15 LAB — GLUCOSE, CAPILLARY
GLUCOSE-CAPILLARY: 152 mg/dL — AB (ref 65–99)
GLUCOSE-CAPILLARY: 177 mg/dL — AB (ref 65–99)
GLUCOSE-CAPILLARY: 182 mg/dL — AB (ref 65–99)
Glucose-Capillary: 173 mg/dL — ABNORMAL HIGH (ref 65–99)
Glucose-Capillary: 152 mg/dL — ABNORMAL HIGH (ref 65–99)

## 2014-12-15 LAB — CBC
HEMATOCRIT: 33.9 % — AB (ref 39.0–52.0)
HEMOGLOBIN: 10.7 g/dL — AB (ref 13.0–17.0)
MCH: 27.4 pg (ref 26.0–34.0)
MCHC: 31.6 g/dL (ref 30.0–36.0)
MCV: 86.7 fL (ref 78.0–100.0)
Platelets: 325 10*3/uL (ref 150–400)
RBC: 3.91 MIL/uL — ABNORMAL LOW (ref 4.22–5.81)
RDW: 16.3 % — ABNORMAL HIGH (ref 11.5–15.5)
WBC: 6.9 10*3/uL (ref 4.0–10.5)

## 2014-12-15 NOTE — Progress Notes (Addendum)
       [address]       [place] [address]             [phone]          4 Days Post-Op Procedure(s) (LRB): SUBXYPHOID PERICARDIAL WINDOW (N/A) TRANSESOPHAGEAL ECHOCARDIOGRAM (TEE) (N/A)  Subjective: Breathing stable, sore with cough.   Objective: Vital signs in last 24 hours: Patient Vitals for the past 24 hrs:  BP Temp Temp src Pulse Resp SpO2 Weight  12/15/14 0735 108/63 mmHg - - 93 18 96 % -  12/15/14 0500 - - - - - - 202 lb 13.2 oz (92 kg)  12/15/14 0316 (!) 100/51 mmHg - - 89 16 99 % -  12/15/14 0314 - 98.4 F (36.9 C) Oral - - - -  12/14/14 2319 103/66 mmHg - - 98 18 96 % -  12/14/14 2317 - 98.4 F (36.9 C) Oral - - - -  12/14/14 1931 - 98.4 F (36.9 C) Oral - - - -  12/14/14 1930 (!) 100/56 mmHg - - 98 17 99 % -  12/14/14 1618 (!) 106/57 mmHg 97.9 F (36.6 C) Oral 99 (!) 24 94 % -  12/14/14 1540 - 98 F (36.7 C) Oral - - - -  12/14/14 1145 107/61 mmHg - - 97 14 97 % -  12/14/14 1143 - 97.8 F (36.6 C) Oral - - - -  12/14/14 0807 - 98 F (36.7 C) Oral - - - -  12/14/14 0755 97/60 mmHg - - 88 14 98 % -   Current Weight  12/15/14 202 lb 13.2 oz (92 kg)     Intake/Output from previous day: 10/31 0701 - 11/01 0700 In: 580 [P.O.:580] Out: 4055 [Urine:3825; Chest Tube:230]    PHYSICAL EXAM:  Heart: RRR, mildly tachy around 100 Lungs: Clear Wound: Clean and dry Chest tube: No air leak    Lab Results: CBC: Recent Labs  12/14/14 0325 12/15/14 0330  WBC 9.1 6.9  HGB 10.2* 10.7*  HCT 32.3* 33.9*  PLT 366 325   BMET:  Recent Labs  12/14/14 0325 12/15/14 0330  NA 138 137  K 3.8 3.8  CL 103 101  CO2 29 32  GLUCOSE 123* 118*  BUN 14 8  CREATININE 0.65 0.56*  CALCIUM 8.1* 8.2*    PT/INR: No results for input(s): LABPROT, INR in the last 72 hours.    Assessment/Plan: S/P Procedure(s) (LRB): SUBXYPHOID PERICARDIAL WINDOW (N/A) TRANSESOPHAGEAL ECHOCARDIOGRAM (TEE) (N/A) CXR not done yet this am, CT drainage  decreasing, around 230 ml total/24hrs.  Hopefully can d/c CTs soon. Will check CXR.  Continue ambulation, pulm toilet. Medical issues per primary, oncology.   LOS: 5 days    COLLINS,GINA H 12/15/2014  I have seen and examined the patient and agree with the assessment and plan as outlined.  Will separate pericardial and left pleural tube to see if output is coming from one or both tubes.  Keep both for now.  Rexene Alberts, MD 12/15/2014 11:37 AM

## 2014-12-15 NOTE — Progress Notes (Signed)
Separated 2 chest tubes with separate sahara collection canisters per Dr Ricard Dillon' order. Spoke with Dr Roxy Manns to confirm that both chest tubes will remain on -20 suction. Consuelo Pandy RN

## 2014-12-15 NOTE — Progress Notes (Signed)
[facility]                                                                              Patient Demographics  Steven Roberts, is a 49 y.o. male, DOB - 1965/05/06, XBD:[id number]  Admit date - 12/10/2014   Admitting Physician Elmarie Shiley, MD  Outpatient Primary MD for the patient is Pcp Not In System  LOS - 5   Chief Complaint  Patient presents with  . Possible biliary obstruction   . Lung Cancer  . Cough  . Abnormal Labs        Brief HPI   Per Dr. Tyrell Antonio due on 12/10/14 Steven Roberts is a 49 y.o. male Hispanic, with recent diagnosis of lung ca 10-2014, metastasis diseases to lung. He has been receiving tarceva and radiation treatments. He went to see his oncologist on the day of admission for regular follow up and was found to have abnormal labs and was refered to [facility] for further evaluation. He was having abdominal pain, epigastric,RU quadrant and supra pubic pain for 1 week. He has had poor oral intake. He reported epigastric pain with meals and early satiety. Denied vomiting, diarrhea. Had small Bowel movement yesterday. He reported Lower extremity edema and abdominal distension for 1 week. He also reported orthopnea and dyspnea on exertion.  On admission, abdominal ultrasound revealed 3 liver masses likely metastatic, cholelithiasis and right pleural effusion. HIDA is pending, and MRCP is negative for biliary dilatation. He was initiated on IV Unasyn, with cultures pending. Surgery was consulted for possible acute cholecystitis. HIDA scan was ordered but could not be completed .Other studies included 2 D echo after massive pericardial effusion was noted on the MRI abdomen. 2-D echo showed large free-flowing pericardial effusion with tamponade physiology. The patient was emergently transferred to [facility] with cardiothoracic consult. Patient underwent subxyphoid pericardial window on 10/28.  Assessment & Plan   Pericardial effusion with tamponade  physiology - MRI of the abdomen noted massive pericardial effusion, 2-D echo showed large free-flowing pericardial effusion with tamponade physiology. - Cardiothoracic surgery was consulted, patient underwent subxyphoid pericardial window on 10/28. - Highly appreciate cardiothoracic surgery assistance, management per [facility] - follow cytology  Transaminases, Hyperbilirubinemia, Abdominal Pain: This could be related to cholelithiasis,cholecystitis vs adverse effect from chemo Tarceva, vs metastasis diseases. Abdominal pain resolved - MRCP showed no biliary dilatation. HIDA scan could not be completed.  -  Hold tarceva, LFTs improving - Per general surgery, significant medical comorbidities, will not pursue gallbladder workup at this time unless the symptoms returned - Unasyn DC'd  Acute encephalopathy with blank stare spell- possibly due to tamponade  - Per documentation, patient apparently had unresponsive episode on 10/28 at [facility] with blank stare, patient did not remember. VS were normal neuro checks were normal and reactive. Possibly patient's effusion was causing the spells that he was having. -Patient recently had an MRI of the brain on 10/6 which had not shown any evidence of metastatic disease to the brain or meningitis. EEG canceled, patient has been stable with no recurrent symptoms  Hyponatremia; resolved Likely related to liver issues or hyperkalemia, SIADH,  urine osmolarity 757.   Lower extremity edema;  Could be related to liver failure, improving with Lasix  Patient had lower extremity venous Dopplers on 10/19 which were negative for DVT  Lung Cancer, metastasis ; Oncology following   Diabetes mellitus: Hyperglycemia SSI.  HBA1c 8, new diagnosis of diabetes. Will place on oral hypoglycemics at the time of discharge   Code Status: full code  Family Communication: Discussed in detail with the patient, all imaging results, lab results explained to the patient  and wife at the bedside  Disposition Plan:   Time Spent in minutes  15 minutes  Procedures  Pericardial window   Consults   Cardiothoracic surgery  Gen. surgery  Oncology   DVT Prophylaxis   SCD's  Medications  Scheduled Meds: . acetaminophen  1,000 mg Oral 4 times per day   Or  . acetaminophen (TYLENOL) oral liquid 160 mg/5 mL  1,000 mg Oral 4 times per day  . docusate sodium  100 mg Oral BID  . insulin aspart  0-9 Units Subcutaneous TID WC  . sodium chloride  3 mL Intravenous Q12H   Continuous Infusions: . 0.9 % NaCl with KCl 20 mEq / L Stopped (12/13/14 2000)   PRN Meds:.sodium chloride, morphine injection, ondansetron **OR** ondansetron (ZOFRAN) IV, oxyCODONE, potassium chloride, sodium chloride   Antibiotics   Anti-infectives    Start     Dose/Rate Route Frequency Ordered Stop   12/12/14 1200  Ampicillin-Sulbactam (UNASYN) 3 g in sodium chloride 0.9 % 100 mL IVPB  Status:  Discontinued     3 g 100 mL/hr over 60 Minutes Intravenous Every 6 hours 12/12/14 1119 12/14/14 1025   12/11/14 2100  cefUROXime (ZINACEF) 1.5 g in dextrose 5 % 50 mL IVPB     1.5 g 100 mL/hr over 30 Minutes Intravenous Every 12 hours 12/11/14 2052 12/12/14 0904   12/10/14 2200  Ampicillin-Sulbactam (UNASYN) 3 g in sodium chloride 0.9 % 100 mL IVPB  Status:  Discontinued     3 g 100 mL/hr over 60 Minutes Intravenous Every 6 hours 12/10/14 1820 12/11/14 2052   12/10/14 1715  ampicillin-sulbactam (UNASYN) 1.5 g in sodium chloride 0.9 % 50 mL IVPB     1.5 g 100 mL/hr over 30 Minutes Intravenous  Once 12/10/14 1713 12/10/14 1902        Subjective:   Steven Roberts was seen and examined today. Denies any specific complaints, Afebrile no abdominal pain or chest pain. Denied any dizziness, nausea, vomiting. Afebrile. No acute issues overnight.  Objective:   Blood pressure 103/60, pulse 100, temperature 98 F (36.7 C), temperature source Oral, resp. rate 17, height '5\' 8"'$  (1.727 m), weight 92  kg (202 lb 13.2 oz), SpO2 97 %.  Wt Readings from Last 3 Encounters:  12/15/14 92 kg (202 lb 13.2 oz)  12/10/14 74.753 kg (164 lb 12.8 oz)  12/10/14 74.39 kg (164 lb)     Intake/Output Summary (Last 24 hours) at 12/15/14 1204 Last data filed at 12/15/14 1133  Gross per 24 hour  Intake   1060 ml  Output   3880 ml  Net  -2820 ml    Exam  General: Alert and oriented x 3, NAD  HEENT:  PERRLA, EOMI  Neck: Supple, no JVD, no masses  CVS: S1 S2 clear, RRR  Respiratory:Decreased breath sounds at the bases   Abdomen: Soft, NT, ND, NBS  Ext: no c/c. 1+ edema improving  Neuro: no new deficits  Skin: No rashes  Psych: Normal  affect and demeanor, alert and oriented x3    Data Review   Micro Results Recent Results (from the past 240 hour(s))  MRSA PCR Screening     Status: None   Collection Time: 12/11/14  8:44 AM  Result Value Ref Range Status   MRSA by PCR NEGATIVE NEGATIVE Final    Comment:        The GeneXpert MRSA Assay (FDA approved for NASAL specimens only), is one component of a comprehensive MRSA colonization surveillance program. It is not intended to diagnose MRSA infection nor to guide or monitor treatment for MRSA infections.   Fungus Culture with Smear     Status: None (Preliminary result)   Collection Time: 12/11/14 12:54 PM  Result Value Ref Range Status   Specimen Description FLUID PERICARDIAL  Final   Special Requests Henry B PT ON UNASYN  Final   Fungal Smear   Final    NO YEAST OR FUNGAL ELEMENTS SEEN Performed at [facility]    Culture   Final    CULTURE IN PROGRESS FOR FOUR WEEKS Performed at [facility]    Report Status PENDING  Incomplete  AFB culture with smear     Status: None (Preliminary result)   Collection Time: 12/11/14 12:54 PM  Result Value Ref Range Status   Specimen Description FLUID PERICARDIAL  Final   Special Requests SPEC B PT ON UNASYN  Final   Acid Fast Smear   Final    NO ACID FAST BACILLI  SEEN Performed at [facility]    Culture   Final    CULTURE WILL BE EXAMINED FOR 6 WEEKS BEFORE ISSUING A FINAL REPORT Performed at [facility]    Report Status PENDING  Incomplete  Culture, body fluid-bottle     Status: None (Preliminary result)   Collection Time: 12/11/14 12:54 PM  Result Value Ref Range Status   Specimen Description FLUID PERICARDIAL  Final   Special Requests NONE  Final   Culture NO GROWTH 3 DAYS  Final   Report Status PENDING  Incomplete  Gram stain     Status: None   Collection Time: 12/11/14 12:54 PM  Result Value Ref Range Status   Specimen Description FLUID PERICARDIAL  Final   Special Requests NONE  Final   Gram Stain   Final    FEW WBC PRESENT,BOTH PMN AND MONONUCLEAR NO ORGANISMS SEEN    Report Status 12/11/2014 FINAL  Final    Radiology Reports Dg Chest 2 View  12/10/2014  CLINICAL DATA:  Cough and shortness of breath. Current diagnosis of lung cancer. EXAM: CHEST  2 VIEW COMPARISON:  PET-CT dated 11/03/2014 FINDINGS: Cardiomediastinal silhouette is enlarged. Mediastinal contours is obscured by a large left perihilar mass. There are bilateral pleural effusions with associated subsegmental bibasilar atelectasis. Osseous structures are without acute abnormality. Soft tissues are grossly normal. IMPRESSION: Large left perihilar mass, consistent with known primary lung malignancy. Bilateral pleural effusions, moderate in size. Enlarged cardiac silhouette. Pericardial effusion cannot be excluded. Electronically Signed   By: Fidela Salisbury M.D.   On: 12/10/2014 15:21   Dg Abd 1 View  12/02/2014  ADDENDUM REPORT: 12/02/2014 16:24 ADDENDUM: Not mentioned above is small-moderate amount of stool in the ascending colon. No evidence of rectal fecal impaction. Electronically Signed   By: Kathreen Devoid   On: 12/02/2014 16:24  12/02/2014  CLINICAL DATA:  The mid and low abdominal pain EXAM: ABDOMEN - 1 VIEW COMPARISON:  PET-CT 11/03/2014  FINDINGS:  The bowel gas pattern is normal. No radio-opaque calculi or other significant radiographic abnormality are seen. IMPRESSION: Negative. Electronically Signed: By: Kathreen Devoid On: 12/02/2014 15:39   Ct Head Wo Contrast  12/11/2014  CLINICAL DATA:  Rolled out of bed and hit right upper posterior head. Initial encounter. EXAM: CT HEAD WITHOUT CONTRAST TECHNIQUE: Contiguous axial images were obtained from the base of the skull through the vertex without intravenous contrast. COMPARISON:  MRI of the brain performed 11/19/2014 FINDINGS: There is no evidence of acute infarction, mass lesion, or intra- or extra-axial hemorrhage on CT. The posterior fossa, including the cerebellum, brainstem and fourth ventricle, is within normal limits. The third and lateral ventricles, and basal ganglia are unremarkable in appearance. The cerebral hemispheres are symmetric in appearance, with normal gray-white differentiation. No mass effect or midline shift is seen. There is no evidence of fracture; visualized osseous structures are unremarkable in appearance. The orbits are within normal limits. The paranasal sinuses and mastoid air cells are well-aerated. No significant soft tissue abnormalities are seen. IMPRESSION: No evidence of traumatic intracranial injury or fracture Electronically Signed   By: Garald Balding M.D.   On: 12/11/2014 05:23   Mr Jeri Cos ZO Contrast  11/19/2014  CLINICAL DATA:  Stage IV non-small cell lung cancer. Left-sided headaches for 3 months. EXAM: MRI HEAD WITHOUT AND WITH CONTRAST TECHNIQUE: Multiplanar, multiecho pulse sequences of the brain and surrounding structures were obtained without and with intravenous contrast. CONTRAST:  17 mL MultiHance. COMPARISON:  None. FINDINGS: No acute infarct, hemorrhage, or mass lesion is present. The ventricles are of normal size. No significant extraaxial fluid collection is present. No significant white matter changes are present. The internal auditory  canals and brainstem are normal. Flow is present in the major intracranial arteries. The globes orbits are intact. Mild mucosal thickening is present in the maxillary sinuses and ethmoid air cells bilaterally. There is minimal mucosal thickening in the right frontal sinus. The sphenoid sinuses and mastoid air cells are clear. Skullbase is within normal limits.  Midline images are unremarkable. Postcontrast images demonstrate no pathologic enhancement to suggest metastatic disease the brain or meninges. Linear enhancement in the right temporal lobe is compatible with a developmental venous anomaly. IMPRESSION: 1. No evidence for metastatic disease to the brain or meninges. 2. 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 3. Minimal sinus disease. Electronically Signed   By: San Morelle M.D.   On: 11/19/2014 09:03   Nm Hepatobiliary Liver Func  12/11/2014  CLINICAL DATA:  Right upper quadrant abdominal pain, nausea, vomiting. EXAM: NUCLEAR MEDICINE HEPATOBILIARY IMAGING TECHNIQUE: Sequential images of the abdomen were obtained out to 60 minutes following intravenous administration of radiopharmaceutical. RADIOPHARMACEUTICALS:  7.4 mCi Tc-37m Choletec IV COMPARISON:  MRI of December 10, 2014. FINDINGS: Normal uptake within hepatic parenchyma is noted. Imaging was only carried out to 40 minutes after isotope administration as the patient had to go to emergency heart surgery. Filling of the duodenum is noted, but no definite gallbladder filling is noted. IMPRESSION: Exam was stopped at 40 minutes after radiotracer administration as patient had to go to surgery. No definite filling of the gallbladder is noted on the available images, but given that the exam did not go 1 hour, this exam is nondiagnostic for the evaluation for cholecystitis. Electronically Signed   By: JMarijo Conception M.D.   On: 12/11/2014 14:53   Mr Abdomen Mrcp Wo Cm  12/11/2014  CLINICAL DATA:  Abdominal pain. Left  upper lobe adenocarcinoma, metastatic. EXAM: MRI ABDOMEN WITHOUT CONTRAST  (INCLUDING MRCP) TECHNIQUE: Multiplanar multisequence MR imaging of the abdomen was performed. Heavily T2-weighted images of the biliary and pancreatic ducts were obtained, and three-dimensional MRCP images were rendered by post processing. COMPARISON:  11/03/2014 ; 12/10/2014 FINDINGS: Despite efforts by the technologist and patient, motion artifact is present on today's exam and could not be eliminated. This reduces exam sensitivity and specificity. Lower chest: Massive pericardial effusion, new compared to 11/03/14. Considerable bilateral pleural effusions. Hepatobiliary: Multiple T2 hyperintense liver lesions are present. This includes a 1.9 by 1.3 cm lesion in segment 2; a 2.8 by 1.8 cm lesion in segment 3; a 3.0 by 2.4 cm lesion posteriorly in segment 4A near the IVC ; and several other smaller right hepatic lobe lesions. Periportal edema is present. This is nonspecific. Attempts at at diagnostic MRCP images worth awarded by motion artifact. There is likely some gallbladder wall thickening and suspected gallstones. No definite biliary dilatation. Pancreas: Grossly unremarkable. Spleen: Unremarkable Adrenals/Urinary Tract: Unremarkable Stomach/Bowel: Unremarkable Vascular/Lymphatic: Unremarkable Other: Upper abdominal ascites especially tracking around the spleen and in the paracolic gutters. Musculoskeletal: Unremarkable where visualized. IMPRESSION: 1. Multiple hepatic lesions measuring up to 3 cm in long axis are T2 hyperintense. On sonography these appeared echogenic. On outside CT angiogram of the chest dated 10/13/2014 there was some arterial phase enhancement associated with some of these lesions. On the nuclear medicine PET-CT from 62/95/2841, no hypermetabolic liver lesion is observed. Accordingly, the possibility is certainly raised that these lesions represent hemangiomas rather than necessarily being due to metastatic disease.  Unfortunately IV contrast could not be administered to confirm this. As part of future staging workups when the patient is less acutely ill, contrast-enhanced hepatic protocol CT or MRI would be suggested. 2. Massive pericardial effusion, new compared to 11/03/2014. Correlation with Beck's triad or other signs of tamponade recommended. 3. Bilateral pleural effusions and ascites. 4. Thick-walled gallbladder, nonspecific, but cholecystitis is not excluded. Cholelithiasis. No definite biliary dilatation ; MRCP sequences or on feasible due to motion artifact. 5. Nonspecific periportal edema. These results will be called to the ordering clinician or representative by the Radiologist Assistant, and communication documented in the PACS or zVision Dashboard. Electronically Signed   By: Van Clines M.D.   On: 12/11/2014 07:53   Mr 3d Recon At Scanner  12/11/2014  CLINICAL DATA:  Abdominal pain. Left upper lobe adenocarcinoma, metastatic. EXAM: MRI ABDOMEN WITHOUT CONTRAST  (INCLUDING MRCP) TECHNIQUE: Multiplanar multisequence MR imaging of the abdomen was performed. Heavily T2-weighted images of the biliary and pancreatic ducts were obtained, and three-dimensional MRCP images were rendered by post processing. COMPARISON:  11/03/2014 ; 12/10/2014 FINDINGS: Despite efforts by the technologist and patient, motion artifact is present on today's exam and could not be eliminated. This reduces exam sensitivity and specificity. Lower chest: Massive pericardial effusion, new compared to 11/03/14. Considerable bilateral pleural effusions. Hepatobiliary: Multiple T2 hyperintense liver lesions are present. This includes a 1.9 by 1.3 cm lesion in segment 2; a 2.8 by 1.8 cm lesion in segment 3; a 3.0 by 2.4 cm lesion posteriorly in segment 4A near the IVC ; and several other smaller right hepatic lobe lesions. Periportal edema is present. This is nonspecific. Attempts at at diagnostic MRCP images worth awarded by motion  artifact. There is likely some gallbladder wall thickening and suspected gallstones. No definite biliary dilatation. Pancreas: Grossly unremarkable. Spleen: Unremarkable Adrenals/Urinary Tract: Unremarkable Stomach/Bowel: Unremarkable Vascular/Lymphatic: Unremarkable Other: Upper abdominal ascites especially tracking around the  spleen and in the paracolic gutters. Musculoskeletal: Unremarkable where visualized. IMPRESSION: 1. Multiple hepatic lesions measuring up to 3 cm in long axis are T2 hyperintense. On sonography these appeared echogenic. On outside CT angiogram of the chest dated 10/13/2014 there was some arterial phase enhancement associated with some of these lesions. On the nuclear medicine PET-CT from 95/63/8756, no hypermetabolic liver lesion is observed. Accordingly, the possibility is certainly raised that these lesions represent hemangiomas rather than necessarily being due to metastatic disease. Unfortunately IV contrast could not be administered to confirm this. As part of future staging workups when the patient is less acutely ill, contrast-enhanced hepatic protocol CT or MRI would be suggested. 2. Massive pericardial effusion, new compared to 11/03/2014. Correlation with Beck's triad or other signs of tamponade recommended. 3. Bilateral pleural effusions and ascites. 4. Thick-walled gallbladder, nonspecific, but cholecystitis is not excluded. Cholelithiasis. No definite biliary dilatation ; MRCP sequences or on feasible due to motion artifact. 5. Nonspecific periportal edema. These results will be called to the ordering clinician or representative by the Radiologist Assistant, and communication documented in the PACS or zVision Dashboard. Electronically Signed   By: Van Clines M.D.   On: 12/11/2014 07:53   US Abdomen Limited  12/10/2014  CLINICAL DATA:  Evaluate for cholecystitis/cause of hepatitis EXAM: US ABDOMEN LIMITED - RIGHT UPPER QUADRANT COMPARISON:  None. FINDINGS: Gallbladder:  Multiple gallstones within the partially full gallbladder. The wall is circumferentially thickened to 6 mm. No pericholecystic edema. Sonographer reports focal tenderness. Common bile duct: Diameter: 2 mm Liver: Three echogenic masses within the liver, ranging between 16 and 24 mm in maximal diameter, without hypoechoic halo. Antegrade flow in the imaged portal venous system. Right pleural effusion, small by chest radiography. IMPRESSION: 1. At least 3 liver masses, primarily concerning for lung cancer metastases. 2. Cholelithiasis with conflicting findings for acute cholecystitis. There is gallbladder wall thickening and focal tenderness, but the gallbladder is not dilated as usually seen with cystic duct obstruction and acute cholecystitis. 3. Right pleural effusion. Electronically Signed   By: Monte Fantasia M.D.   On: 12/10/2014 16:12   Dg Chest Port 1 View  12/15/2014  CLINICAL DATA:  Pleural effusion.  Lung cancer. EXAM: PORTABLE CHEST 1 VIEW COMPARISON:  12/14/2014 FINDINGS: Interval improvement in bibasilar airspace disease, left greater than right. This likely reflects interval improvement of pulmonary edema. There remains residual airspace disease in the bases left greater than right which has improved. Left chest tube remains in place. Minimal left effusion. No pneumothorax. IMPRESSION: Interval improvement in bibasilar airspace disease left greater than right. Left chest tube remains in place without pneumothorax. Minimal left effusion. Electronically Signed   By: Franchot Gallo M.D.   On: 12/15/2014 08:37   Dg Chest Port 1 View  12/14/2014  CLINICAL DATA:  Pericardial effusion. EXAM: PORTABLE CHEST 1 VIEW COMPARISON:  12/13/2014 . FINDINGS: Left chest tube in stable position. Persistent cardiomegaly with bilateral pulmonary alveolar infiltrates consistent with congestive heart failure and pulmonary edema. Small pleural effusions are noted. No definite pneumothorax . No acute bony abnormality .  IMPRESSION: 1. Persistent cardiomegaly and bilateral pulmonary alveolar infiltrates consistent with congestive heart failure. Small bilateral pleural effusions again noted . Persistent low lung volumes. 2. Left chest tube in stable position. No definite pneumothorax noted. Electronically Signed   By: [place]   On: 12/14/2014 07:43   Dg Chest Port 1 View  12/13/2014  CLINICAL DATA:  Pneumothorax, chest tube.  Pericardial effusion EXAM: PORTABLE CHEST  1 VIEW COMPARISON:  Radiograph 12/12/2014 FINDINGS: LEFT chest tube in place. No appreciable pneumothorax. Prominent cardiac silhouette is similar prior. LEFT upper lobe mass is difficult to define. There is bilateral pleural effusions not changed. IMPRESSION: 1. No interval change. 2. LEFT chest tube in place without pneumothorax. 3. Central venous congestion and bilateral effusions. 4. Prominent cardiac silhouette is not changed. Electronically Signed   By: Suzy Bouchard M.D.   On: 12/13/2014 08:17   Dg Chest Port 1 View  12/12/2014  CLINICAL DATA:  Pneumothorax, chest tube present. History of pericardial effusion with cardiac tamponade. Cancer. EXAM: PORTABLE CHEST 1 VIEW COMPARISON:  12/11/2014 FINDINGS: Low lung volumes. Left chest tube.  No pneumothorax is seen. Known left upper lobe mass with lymphangitic spread in the left upper lobe. Superimposed right perihilar edema and/or infection. Additional patchy opacity in the right lower lobe, atelectasis versus pneumonia. Suspected small bilateral pleural effusions. Cardiomegaly. IMPRESSION: Left chest tube.  No pneumothorax is seen. Known left upper lobe mass with lymphangitic spread in the left upper lobe. Superimposed right perihilar edema and/or infection. Additional patchy right lower lobe opacity, atelectasis versus pneumonia. Small bilateral pleural effusions. Electronically Signed   By: Julian Hy M.D.   On: 12/12/2014 12:24   Dg Chest Port 1 View  12/11/2014  CLINICAL DATA:   Atelectasis EXAM: PORTABLE CHEST 1 VIEW COMPARISON:  12/10/2014 FINDINGS: Marked cardiopericardial enlargement. There is hazy opacity over the bilateral chest compatible with layering pleural effusions. New placement of drains over the chest post subxiphoid window. Hazy mass in the left upper chest in this patient with known adenocarcinoma. IMPRESSION: 1. Decreased cardiopericardial enlargement after pericardial effusion drainage/drain placement. 2. Lower lung volumes with bilateral layering pleural effusion. 3. Extensive left upper lobe mass. Electronically Signed   By: Monte Fantasia M.D.   On: 12/11/2014 14:23    CBC  Recent Labs Lab 12/10/14 1017  12/10/14 1508 12/11/14 0537 12/12/14 0546 12/13/14 0435 12/14/14 0325 12/15/14 0330  WBC 13.3*  < > 13.9* 12.7* 10.8* 10.7* 9.1 6.9  HGB 10.8*  < > 11.1* 10.5* 10.3* 10.0* 10.2* 10.7*  HCT 33.1*  < > 33.7* 32.3* 31.2* 32.1* 32.3* 33.9*  PLT 532*  < > 546* 516* 401* 325 366 325  MCV 85.3  < > 84.9 85.9 85.7 87.0 87.1 86.7  MCH 27.8  < > 28.0 27.9 28.3 27.1 27.5 27.4  MCHC 32.6  < > 32.9 32.5 33.0 31.2 31.6 31.6  RDW 16.1*  < > 15.8* 16.2* 16.3* 16.5* 16.5* 16.3*  LYMPHSABS 0.8*  --  1.0  --   --   --   --   --   MONOABS 1.1*  --  1.3*  --   --   --   --   --   EOSABS 0.0  --  0.0  --   --   --   --   --   BASOSABS 0.0  --  0.0  --   --   --   --   --   < > = values in this interval not displayed.  Chemistries   Recent Labs Lab 12/10/14 1508 12/11/14 0537 12/12/14 0546 12/13/14 0435 12/14/14 0325 12/15/14 0330  NA 126* 129* 127* 129* 138 137  K 4.6 4.3 4.5 4.4 3.8 3.8  CL 93* 97* 96* 97* 103 101  CO2 23 21* '26 24 29 '$ 32  GLUCOSE 231* 222* 212* 152* 123* 118*  BUN 42* 37* 23* 18 14 8  CREATININE 1.11 1.04 0.84 0.72 0.65 0.56*  CALCIUM 8.7* 8.7* 8.2* 8.0* 8.1* 8.2*  AST 169* 117*  --  40 38 36  ALT 505* 422*  --  193* 141* 113*  ALKPHOS 218* 192*  --  128* 118 124  BILITOT 3.3* 3.2*  --  2.2* 1.2 1.0    ------------------------------------------------------------------------------------------------------------------ estimated creatinine clearance is 125.6 mL/min (by C-G formula based on Cr of 0.56). ------------------------------------------------------------------------------------------------------------------ No results for input(s): HGBA1C in the last 72 hours. ------------------------------------------------------------------------------------------------------------------ No results for input(s): CHOL, HDL, LDLCALC, TRIG, CHOLHDL, LDLDIRECT in the last 72 hours. ------------------------------------------------------------------------------------------------------------------ No results for input(s): TSH, T4TOTAL, T3FREE, THYROIDAB in the last 72 hours.  Invalid input(s): FREET3 ------------------------------------------------------------------------------------------------------------------ No results for input(s): VITAMINB12, FOLATE, FERRITIN, TIBC, IRON, RETICCTPCT in the last 72 hours.  Coagulation profile  Recent Labs Lab 12/10/14 1508  INR 1.46    No results for input(s): DDIMER in the last 72 hours.  Cardiac Enzymes No results for input(s): CKMB, TROPONINI, MYOGLOBIN in the last 168 hours.  Invalid input(s): CK ------------------------------------------------------------------------------------------------------------------ Invalid input(s): [place]  12/13/14 2125 12/14/14 0806 12/14/14 1140 12/14/14 1627 12/14/14 2117 12/15/14 0831  GLUCAP 192* 125* 220* 173* 173* 152*     Bijal Siglin M.D. [facility] 12/15/2014, 12:04 PM  Pager: [phone] Between 7am to 7pm - call Pager - [phone]  After 7pm go to [email or web address] - password [email or web address]  Call night coverage person covering after 7pm

## 2014-12-16 ENCOUNTER — Ambulatory Visit: Payer: Medicaid Other

## 2014-12-16 ENCOUNTER — Inpatient Hospital Stay (HOSPITAL_COMMUNITY): Payer: Medicaid Other

## 2014-12-16 LAB — BASIC METABOLIC PANEL
ANION GAP: 6 (ref 5–15)
BUN: 7 mg/dL (ref 6–20)
CO2: 29 mmol/L (ref 22–32)
Calcium: 8.7 mg/dL — ABNORMAL LOW (ref 8.9–10.3)
Chloride: 98 mmol/L — ABNORMAL LOW (ref 101–111)
Creatinine, Ser: 0.61 mg/dL (ref 0.61–1.24)
Glucose, Bld: 123 mg/dL — ABNORMAL HIGH (ref 65–99)
POTASSIUM: 3.8 mmol/L (ref 3.5–5.1)
SODIUM: 133 mmol/L — AB (ref 135–145)

## 2014-12-16 LAB — CULTURE, BODY FLUID-BOTTLE

## 2014-12-16 LAB — GLUCOSE, CAPILLARY
GLUCOSE-CAPILLARY: 124 mg/dL — AB (ref 65–99)
GLUCOSE-CAPILLARY: 130 mg/dL — AB (ref 65–99)
Glucose-Capillary: 193 mg/dL — ABNORMAL HIGH (ref 65–99)
Glucose-Capillary: 156 mg/dL — ABNORMAL HIGH (ref 65–99)

## 2014-12-16 LAB — CULTURE, BODY FLUID W GRAM STAIN -BOTTLE: Culture: NO GROWTH

## 2014-12-16 MED ORDER — ENSURE ENLIVE PO LIQD
237.0000 mL | Freq: Two times a day (BID) | ORAL | Status: DC
  Administered 2014-12-17 – 2014-12-20 (×5): 237 mL via ORAL

## 2014-12-16 NOTE — Progress Notes (Addendum)
[address]Suite [address]       [place] [address]             [phone]          5 Days Post-Op Procedure(s) (LRB): SUBXYPHOID PERICARDIAL WINDOW (N/A) TRANSESOPHAGEAL ECHOCARDIOGRAM (TEE) (N/A)  Subjective: Breathing stable, no complaints.   Objective: Vital signs in last 24 hours: Patient Vitals for the past 24 hrs:  BP Temp Temp src Pulse Resp SpO2 Weight  12/16/14 0500 - - - - - - 183 lb 3.2 oz (83.1 kg)  12/16/14 0427 (!) 103/59 mmHg - - 89 12 97 % -  12/16/14 0425 - 98.3 F (36.8 C) Oral - - - -  12/15/14 2341 (!) 98/53 mmHg - - 91 (!) 38 99 % -  12/15/14 2339 - 98 F (36.7 C) Oral - - - -  12/15/14 1943 112/66 mmHg - - (!) 103 (!) 28 99 % -  12/15/14 1938 - 98 F (36.7 C) Oral - - - -  12/15/14 1529 105/63 mmHg 98.1 F (36.7 C) Oral (!) 103 (!) 28 100 % -  12/15/14 1133 103/60 mmHg - - 100 17 97 % -  12/15/14 1131 - 98 F (36.7 C) Oral - - - -  12/15/14 0832 - 98.4 F (36.9 C) Oral - - - -   Current Weight  12/16/14 183 lb 3.2 oz (83.1 kg)     Intake/Output from previous day: 11/01 0701 - 11/02 0700 In: 840 [P.O.:840] Out: 3210 [Urine:3000; Chest Tube:210]    PHYSICAL EXAM:  Heart: RRR Lungs: Clear Wound: Clean and dry Chest tubes: No air leak    Lab Results: CBC: Recent Labs  12/14/14 0325 12/15/14 0330  WBC 9.1 6.9  HGB 10.2* 10.7*  HCT 32.3* 33.9*  PLT 366 325   BMET:  Recent Labs  12/14/14 0325 12/15/14 0330  NA 138 137  K 3.8 3.8  CL 103 101  CO2 29 32  GLUCOSE 123* 118*  BUN 14 8  CREATININE 0.65 0.56*  CALCIUM 8.1* 8.2*    PT/INR: No results for input(s): LABPROT, INR in the last 72 hours.  CXR: FINDINGS: The lungs remain mildly hypoinflated. The left-sided chest tube is unchanged with its tip projecting over the posterior medial aspect of the fourth rib. There is no definite pneumothorax. There is persistent interstitial and early alveolar airspace disease bilaterally. Slight interval  improvement at the right lung base has occurred. No significant pleural effusion is observed. The cardiac silhouette is not enlarged. The pericardial drain is stable in appearance.  IMPRESSION: Slight interval improvement in airspace opacities at the right lung base. Stable appearance of the left lung. The left-sided chest tube and the pericardial drain are is unchanged.   Assessment/Plan: S/P Procedure(s) (LRB): SUBXYPHOID PERICARDIAL WINDOW (N/A) TRANSESOPHAGEAL ECHOCARDIOGRAM (TEE) (N/A) CXR stable, CT drainage minimal, 80 ml from #1and 60 from #2 over past shift. No air leak. Hopefully can d/c CTs soon.  Will place CTs to water seal for now and saline lock IVF. Continue medical care per primary-am labs pending, being drawn currently.   LOS: 6 days    COLLINS,GINA H 12/16/2014  I have seen and examined the patient and agree with the assessment and plan as outlined.  Chest tube output trending down but still too much from either tube for removal.  Hopefully drainage will decrease so that they can be removed soon.  More aggressive diuretic therapy might help as the patient  remains fluid overloaded on exam.  Rexene Alberts, MD 12/16/2014 8:57 AM

## 2014-12-16 NOTE — Progress Notes (Signed)
[facility]                                                                              Patient Demographics  Steven Roberts, is a 49 y.o. male, DOB - 1966-01-26, MEQ:[id number]  Admit date - 12/10/2014   Admitting Physician Elmarie Shiley, MD  Outpatient Primary MD for the patient is Pcp Not In System  LOS - 6   Chief Complaint  Patient presents with  . Possible biliary obstruction   . Lung Cancer  . Cough  . Abnormal Labs        Brief HPI   Per Dr. Tyrell Antonio due on 12/10/14 Steven Roberts is a 49 y.o. male Hispanic, with recent diagnosis of lung ca 10-2014, metastasis diseases to lung. He has been receiving tarceva and radiation treatments. He went to see his oncologist on the day of admission for regular follow up and was found to have abnormal labs and was refered to [facility] for further evaluation. He was having abdominal pain, epigastric,RU quadrant and supra pubic pain for 1 week. He has had poor oral intake. He reported epigastric pain with meals and early satiety. Denied vomiting, diarrhea. Had small Bowel movement yesterday. He reported Lower extremity edema and abdominal distension for 1 week. He also reported orthopnea and dyspnea on exertion.  On admission, abdominal ultrasound revealed 3 liver masses likely metastatic, cholelithiasis and right pleural effusion. HIDA is pending, and MRCP is negative for biliary dilatation. He was initiated on IV Unasyn, with cultures pending. Surgery was consulted for possible acute cholecystitis. HIDA scan was ordered but could not be completed .Other studies included 2 D echo after massive pericardial effusion was noted on the MRI abdomen. 2-D echo showed large free-flowing pericardial effusion with tamponade physiology. The patient was emergently transferred to [facility] with cardiothoracic consult. Patient underwent subxyphoid pericardial window on 10/28, currently postop management by cardiothoracic  surgery.  Assessment & Plan   Pericardial effusion with tamponade physiology - MRI of the abdomen noted massive pericardial effusion, 2-D echo showed large free-flowing pericardial effusion with tamponade physiology. - Cardiothoracic surgery was consulted, patient underwent subxyphoid pericardial window on 10/28. - Highly appreciate cardiothoracic surgery assistance, management per [facility] - Per cytology report:  Inflamed fibroadipose adipose tissue, no malignancy   Transaminases, Hyperbilirubinemia, Abdominal Pain: This could be related to cholelithiasis,cholecystitis vs adverse effect from chemo Tarceva, vs metastasis diseases. Abdominal pain resolved - MRCP showed no biliary dilatation. HIDA scan could not be completed.  - Per oncology, continue to hold Tarceva. LFTs have been improving. - Per general surgery, significant medical comorbidities, will not pursue gallbladder workup at this time unless the symptoms returned. Antibiotics discontinued, surgery signed off.  Acute encephalopathy with blank stare spell- possibly due to tamponade  - Per documentation, patient apparently had unresponsive episode on 10/28 at [facility] with blank stare, patient did not remember. VS were normal neuro checks were normal and reactive. Possibly patient's effusion was causing the spells that he was having. Patient has remained alert and oriented with no staring spells postop. -Patient recently had an MRI of the brain on 10/6 which had  not shown any evidence of metastatic disease to the brain or meningitis. EEG canceled, patient has been stable with no recurrent symptoms  Hyponatremia; resolved Likely related to liver issues or hyperkalemia, SIADH, urine osmolarity 757.   Lower extremity edema;  Could be related to liver failure, improving with Lasix  Patient had lower extremity venous Dopplers on 10/19 which were negative for DVT  Lung Cancer, metastasis ; Oncology following   Diabetes  mellitus: Hyperglycemia SSI.  HBA1c 8, new diagnosis of diabetes. Will place on oral hypoglycemics at the time of discharge   Code Status: full code  Family Communication: Discussed in detail with the patient, all imaging results, lab results explained to the patient. Niece at the bedside.  Disposition Plan:   Time Spent in minutes  15 minutes  Procedures  Pericardial window   Consults   Cardiothoracic surgery  Gen. surgery  Oncology   DVT Prophylaxis   SCD's  Medications  Scheduled Meds: . acetaminophen  1,000 mg Oral 4 times per day   Or  . acetaminophen (TYLENOL) oral liquid 160 mg/5 mL  1,000 mg Oral 4 times per day  . docusate sodium  100 mg Oral BID  . insulin aspart  0-9 Units Subcutaneous TID WC  . sodium chloride  3 mL Intravenous Q12H   Continuous Infusions:   PRN Meds:.sodium chloride, morphine injection, ondansetron **OR** ondansetron (ZOFRAN) IV, oxyCODONE, potassium chloride, sodium chloride   Antibiotics   Anti-infectives    Start     Dose/Rate Route Frequency Ordered Stop   12/12/14 1200  Ampicillin-Sulbactam (UNASYN) 3 g in sodium chloride 0.9 % 100 mL IVPB  Status:  Discontinued     3 g 100 mL/hr over 60 Minutes Intravenous Every 6 hours 12/12/14 1119 12/14/14 1025   12/11/14 2100  cefUROXime (ZINACEF) 1.5 g in dextrose 5 % 50 mL IVPB     1.5 g 100 mL/hr over 30 Minutes Intravenous Every 12 hours 12/11/14 2052 12/12/14 0904   12/10/14 2200  Ampicillin-Sulbactam (UNASYN) 3 g in sodium chloride 0.9 % 100 mL IVPB  Status:  Discontinued     3 g 100 mL/hr over 60 Minutes Intravenous Every 6 hours 12/10/14 1820 12/11/14 2052   12/10/14 1715  ampicillin-sulbactam (UNASYN) 1.5 g in sodium chloride 0.9 % 50 mL IVPB     1.5 g 100 mL/hr over 30 Minutes Intravenous  Once 12/10/14 1713 12/10/14 1902        Subjective:   Steven Roberts was seen and examined today. Denies any specific complaints, Afebrile. No acute issues, denies abdominal pain,  dizziness, nausea, vomiting, fevers.  Objective:   Blood pressure 107/66, pulse 97, temperature 98.2 F (36.8 C), temperature source Oral, resp. rate 18, height '5\' 8"'$  (1.727 m), weight 83.1 kg (183 lb 3.2 oz), SpO2 96 %.  Wt Readings from Last 3 Encounters:  12/16/14 83.1 kg (183 lb 3.2 oz)  12/10/14 74.753 kg (164 lb 12.8 oz)  12/10/14 74.39 kg (164 lb)     Intake/Output Summary (Last 24 hours) at 12/16/14 1059 Last data filed at 12/16/14 0700  Gross per 24 hour  Intake    600 ml  Output   2700 ml  Net  -2100 ml    Exam  General: Alert and oriented x 3, NAD  HEENT:  PERRLA, EOMI  Neck: Supple, no JVD, no masses  CVS: S1 S2 clear, RRR  Respiratory:Decreased breath sounds at the bases   Abdomen: Soft, NT, ND, NBS  Ext: no c/c. 1+  edema improving  Neuro: no new deficits  Skin: No rashes  Psych: Normal affect and demeanor, alert and oriented x3    Data Review   Micro Results Recent Results (from the past 240 hour(s))  MRSA PCR Screening     Status: None   Collection Time: 12/11/14  8:44 AM  Result Value Ref Range Status   MRSA by PCR NEGATIVE NEGATIVE Final    Comment:        The GeneXpert MRSA Assay (FDA approved for NASAL specimens only), is one component of a comprehensive MRSA colonization surveillance program. It is not intended to diagnose MRSA infection nor to guide or monitor treatment for MRSA infections.   Fungus Culture with Smear     Status: None (Preliminary result)   Collection Time: 12/11/14 12:54 PM  Result Value Ref Range Status   Specimen Description FLUID PERICARDIAL  Final   Special Requests [place] [facility] PT ON UNASYN  Final   Fungal Smear   Final    NO YEAST OR FUNGAL ELEMENTS SEEN Performed at [facility]    Culture   Final    CULTURE IN PROGRESS FOR FOUR WEEKS Performed at [facility]    Report Status PENDING  Incomplete  AFB culture with smear     Status: None (Preliminary result)   Collection Time:  12/11/14 12:54 PM  Result Value Ref Range Status   Specimen Description FLUID PERICARDIAL  Final   Special Requests SPEC B PT ON UNASYN  Final   Acid Fast Smear   Final    NO ACID FAST BACILLI SEEN Performed at [facility]    Culture   Final    CULTURE WILL BE EXAMINED FOR 6 WEEKS BEFORE ISSUING A FINAL REPORT Performed at [facility]    Report Status PENDING  Incomplete  Culture, body fluid-bottle     Status: None (Preliminary result)   Collection Time: 12/11/14 12:54 PM  Result Value Ref Range Status   Specimen Description FLUID PERICARDIAL  Final   Special Requests NONE  Final   Culture NO GROWTH 4 DAYS  Final   Report Status PENDING  Incomplete  Gram stain     Status: None   Collection Time: 12/11/14 12:54 PM  Result Value Ref Range Status   Specimen Description FLUID PERICARDIAL  Final   Special Requests NONE  Final   Gram Stain   Final    FEW WBC PRESENT,BOTH PMN AND MONONUCLEAR NO ORGANISMS SEEN    Report Status 12/11/2014 FINAL  Final    Radiology Reports Dg Chest 2 View  12/10/2014  CLINICAL DATA:  Cough and shortness of breath. Current diagnosis of lung cancer. EXAM: CHEST  2 VIEW COMPARISON:  PET-CT dated 11/03/2014 FINDINGS: Cardiomediastinal silhouette is enlarged. Mediastinal contours is obscured by a large left perihilar mass. There are bilateral pleural effusions with associated subsegmental bibasilar atelectasis. Osseous structures are without acute abnormality. Soft tissues are grossly normal. IMPRESSION: Large left perihilar mass, consistent with known primary lung malignancy. Bilateral pleural effusions, moderate in size. Enlarged cardiac silhouette. Pericardial effusion cannot be excluded. Electronically Signed   By: Fidela Salisbury M.D.   On: 12/10/2014 15:21   Dg Abd 1 View  12/02/2014  ADDENDUM REPORT: 12/02/2014 16:24 ADDENDUM: Not mentioned above is small-moderate amount of stool in the ascending colon. No evidence of rectal fecal  impaction. Electronically Signed   By: Kathreen Devoid   On: 12/02/2014 16:24  12/02/2014  CLINICAL DATA:  The mid  and low abdominal pain EXAM: ABDOMEN - 1 VIEW COMPARISON:  PET-CT 11/03/2014 FINDINGS: The bowel gas pattern is normal. No radio-opaque calculi or other significant radiographic abnormality are seen. IMPRESSION: Negative. Electronically Signed: By: Kathreen Devoid On: 12/02/2014 15:39   Ct Head Wo Contrast  12/11/2014  CLINICAL DATA:  Rolled out of bed and hit right upper posterior head. Initial encounter. EXAM: CT HEAD WITHOUT CONTRAST TECHNIQUE: Contiguous axial images were obtained from the base of the skull through the vertex without intravenous contrast. COMPARISON:  MRI of the brain performed 11/19/2014 FINDINGS: There is no evidence of acute infarction, mass lesion, or intra- or extra-axial hemorrhage on CT. The posterior fossa, including the cerebellum, brainstem and fourth ventricle, is within normal limits. The third and lateral ventricles, and basal ganglia are unremarkable in appearance. The cerebral hemispheres are symmetric in appearance, with normal gray-white differentiation. No mass effect or midline shift is seen. There is no evidence of fracture; visualized osseous structures are unremarkable in appearance. The orbits are within normal limits. The paranasal sinuses and mastoid air cells are well-aerated. No significant soft tissue abnormalities are seen. IMPRESSION: No evidence of traumatic intracranial injury or fracture Electronically Signed   By: Garald Balding M.D.   On: 12/11/2014 05:23   Mr Jeri Cos VO Contrast  11/19/2014  CLINICAL DATA:  Stage IV non-small cell lung cancer. Left-sided headaches for 3 months. EXAM: MRI HEAD WITHOUT AND WITH CONTRAST TECHNIQUE: Multiplanar, multiecho pulse sequences of the brain and surrounding structures were obtained without and with intravenous contrast. CONTRAST:  17 mL MultiHance. COMPARISON:  None. FINDINGS: No acute infarct, hemorrhage,  or mass lesion is present. The ventricles are of normal size. No significant extraaxial fluid collection is present. No significant white matter changes are present. The internal auditory canals and brainstem are normal. Flow is present in the major intracranial arteries. The globes orbits are intact. Mild mucosal thickening is present in the maxillary sinuses and ethmoid air cells bilaterally. There is minimal mucosal thickening in the right frontal sinus. The sphenoid sinuses and mastoid air cells are clear. Skullbase is within normal limits.  Midline images are unremarkable. Postcontrast images demonstrate no pathologic enhancement to suggest metastatic disease the brain or meninges. Linear enhancement in the right temporal lobe is compatible with a developmental venous anomaly. IMPRESSION: 1. No evidence for metastatic disease to the brain or meninges. 2. 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 3. Minimal sinus disease. Electronically Signed   By: San Morelle M.D.   On: 11/19/2014 09:03   Nm Hepatobiliary Liver Func  12/11/2014  CLINICAL DATA:  Right upper quadrant abdominal pain, nausea, vomiting. EXAM: NUCLEAR MEDICINE HEPATOBILIARY IMAGING TECHNIQUE: Sequential images of the abdomen were obtained out to 60 minutes following intravenous administration of radiopharmaceutical. RADIOPHARMACEUTICALS:  7.4 mCi Tc-73m Choletec IV COMPARISON:  MRI of December 10, 2014. FINDINGS: Normal uptake within hepatic parenchyma is noted. Imaging was only carried out to 40 minutes after isotope administration as the patient had to go to emergency heart surgery. Filling of the duodenum is noted, but no definite gallbladder filling is noted. IMPRESSION: Exam was stopped at 40 minutes after radiotracer administration as patient had to go to surgery. No definite filling of the gallbladder is noted on the available images, but given that the exam did not go 1 hour, this exam is nondiagnostic  for the evaluation for cholecystitis. Electronically Signed   By: JMarijo Conception M.D.   On: 12/11/2014 14:53  Mr Abdomen Mrcp Wo Cm  12/11/2014  CLINICAL DATA:  Abdominal pain. Left upper lobe adenocarcinoma, metastatic. EXAM: MRI ABDOMEN WITHOUT CONTRAST  (INCLUDING MRCP) TECHNIQUE: Multiplanar multisequence MR imaging of the abdomen was performed. Heavily T2-weighted images of the biliary and pancreatic ducts were obtained, and three-dimensional MRCP images were rendered by post processing. COMPARISON:  11/03/2014 ; 12/10/2014 FINDINGS: Despite efforts by the technologist and patient, motion artifact is present on today's exam and could not be eliminated. This reduces exam sensitivity and specificity. Lower chest: Massive pericardial effusion, new compared to 11/03/14. Considerable bilateral pleural effusions. Hepatobiliary: Multiple T2 hyperintense liver lesions are present. This includes a 1.9 by 1.3 cm lesion in segment 2; a 2.8 by 1.8 cm lesion in segment 3; a 3.0 by 2.4 cm lesion posteriorly in segment 4A near the IVC ; and several other smaller right hepatic lobe lesions. Periportal edema is present. This is nonspecific. Attempts at at diagnostic MRCP images worth awarded by motion artifact. There is likely some gallbladder wall thickening and suspected gallstones. No definite biliary dilatation. Pancreas: Grossly unremarkable. Spleen: Unremarkable Adrenals/Urinary Tract: Unremarkable Stomach/Bowel: Unremarkable Vascular/Lymphatic: Unremarkable Other: Upper abdominal ascites especially tracking around the spleen and in the paracolic gutters. Musculoskeletal: Unremarkable where visualized. IMPRESSION: 1. Multiple hepatic lesions measuring up to 3 cm in long axis are T2 hyperintense. On sonography these appeared echogenic. On outside CT angiogram of the chest dated 10/13/2014 there was some arterial phase enhancement associated with some of these lesions. On the nuclear medicine PET-CT from 11/03/2014,  no hypermetabolic liver lesion is observed. Accordingly, the possibility is certainly raised that these lesions represent hemangiomas rather than necessarily being due to metastatic disease. Unfortunately IV contrast could not be administered to confirm this. As part of future staging workups when the patient is less acutely ill, contrast-enhanced hepatic protocol CT or MRI would be suggested. 2. Massive pericardial effusion, new compared to 11/03/2014. Correlation with Beck's triad or other signs of tamponade recommended. 3. Bilateral pleural effusions and ascites. 4. Thick-walled gallbladder, nonspecific, but cholecystitis is not excluded. Cholelithiasis. No definite biliary dilatation ; MRCP sequences or on feasible due to motion artifact. 5. Nonspecific periportal edema. These results will be called to the ordering clinician or representative by the Radiologist Assistant, and communication documented in the PACS or zVision Dashboard. Electronically Signed   By: Van Clines M.D.   On: 12/11/2014 07:53   Mr 3d Recon At Scanner  12/11/2014  CLINICAL DATA:  Abdominal pain. Left upper lobe adenocarcinoma, metastatic. EXAM: MRI ABDOMEN WITHOUT CONTRAST  (INCLUDING MRCP) TECHNIQUE: Multiplanar multisequence MR imaging of the abdomen was performed. Heavily T2-weighted images of the biliary and pancreatic ducts were obtained, and three-dimensional MRCP images were rendered by post processing. COMPARISON:  11/03/2014 ; 12/10/2014 FINDINGS: Despite efforts by the technologist and patient, motion artifact is present on today's exam and could not be eliminated. This reduces exam sensitivity and specificity. Lower chest: Massive pericardial effusion, new compared to 11/03/14. Considerable bilateral pleural effusions. Hepatobiliary: Multiple T2 hyperintense liver lesions are present. This includes a 1.9 by 1.3 cm lesion in segment 2; a 2.8 by 1.8 cm lesion in segment 3; a 3.0 by 2.4 cm lesion posteriorly in segment  4A near the IVC ; and several other smaller right hepatic lobe lesions. Periportal edema is present. This is nonspecific. Attempts at at diagnostic MRCP images worth awarded by motion artifact. There is likely some gallbladder wall thickening and suspected gallstones. No definite biliary dilatation. Pancreas: Grossly unremarkable. Spleen: Unremarkable Adrenals/Urinary  Tract: Unremarkable Stomach/Bowel: Unremarkable Vascular/Lymphatic: Unremarkable Other: Upper abdominal ascites especially tracking around the spleen and in the paracolic gutters. Musculoskeletal: Unremarkable where visualized. IMPRESSION: 1. Multiple hepatic lesions measuring up to 3 cm in long axis are T2 hyperintense. On sonography these appeared echogenic. On outside CT angiogram of the chest dated 10/13/2014 there was some arterial phase enhancement associated with some of these lesions. On the nuclear medicine PET-CT from 03/47/4259, no hypermetabolic liver lesion is observed. Accordingly, the possibility is certainly raised that these lesions represent hemangiomas rather than necessarily being due to metastatic disease. Unfortunately IV contrast could not be administered to confirm this. As part of future staging workups when the patient is less acutely ill, contrast-enhanced hepatic protocol CT or MRI would be suggested. 2. Massive pericardial effusion, new compared to 11/03/2014. Correlation with Beck's triad or other signs of tamponade recommended. 3. Bilateral pleural effusions and ascites. 4. Thick-walled gallbladder, nonspecific, but cholecystitis is not excluded. Cholelithiasis. No definite biliary dilatation ; MRCP sequences or on feasible due to motion artifact. 5. Nonspecific periportal edema. These results will be called to the ordering clinician or representative by the Radiologist Assistant, and communication documented in the PACS or zVision Dashboard. Electronically Signed   By: Van Clines M.D.   On: 12/11/2014 07:53    US Abdomen Limited  12/10/2014  CLINICAL DATA:  Evaluate for cholecystitis/cause of hepatitis EXAM: US ABDOMEN LIMITED - RIGHT UPPER QUADRANT COMPARISON:  None. FINDINGS: Gallbladder: Multiple gallstones within the partially full gallbladder. The wall is circumferentially thickened to 6 mm. No pericholecystic edema. Sonographer reports focal tenderness. Common bile duct: Diameter: 2 mm Liver: Three echogenic masses within the liver, ranging between 16 and 24 mm in maximal diameter, without hypoechoic halo. Antegrade flow in the imaged portal venous system. Right pleural effusion, small by chest radiography. IMPRESSION: 1. At least 3 liver masses, primarily concerning for lung cancer metastases. 2. Cholelithiasis with conflicting findings for acute cholecystitis. There is gallbladder wall thickening and focal tenderness, but the gallbladder is not dilated as usually seen with cystic duct obstruction and acute cholecystitis. 3. Right pleural effusion. Electronically Signed   By: Monte Fantasia M.D.   On: 12/10/2014 16:12   Dg Chest Port 1 View  12/16/2014  CLINICAL DATA:  Follow-up pleural effusion, known stage IV adenocarcinoma of the lung EXAM: PORTABLE CHEST 1 VIEW COMPARISON:  Portable chest x-ray of December 15, 2014 FINDINGS: The lungs remain mildly hypoinflated. The left-sided chest tube is unchanged with its tip projecting over the posterior medial aspect of the fourth rib. There is no definite pneumothorax. There is persistent interstitial and early alveolar airspace disease bilaterally. Slight interval improvement at the right lung base has occurred. No significant pleural effusion is observed. The cardiac silhouette is not enlarged. The pericardial drain is stable in appearance. IMPRESSION: Slight interval improvement in airspace opacities at the right lung base. Stable appearance of the left lung. The left-sided chest tube and the pericardial drain are is unchanged. Electronically Signed   By:  David  Martinique M.D.   On: 12/16/2014 07:45   Dg Chest Port 1 View  12/15/2014  CLINICAL DATA:  Pleural effusion.  Lung cancer. EXAM: PORTABLE CHEST 1 VIEW COMPARISON:  12/14/2014 FINDINGS: Interval improvement in bibasilar airspace disease, left greater than right. This likely reflects interval improvement of pulmonary edema. There remains residual airspace disease in the bases left greater than right which has improved. Left chest tube remains in place. Minimal left effusion. No pneumothorax. IMPRESSION: Interval improvement in  bibasilar airspace disease left greater than right. Left chest tube remains in place without pneumothorax. Minimal left effusion. Electronically Signed   By: Franchot Gallo M.D.   On: 12/15/2014 08:37   Dg Chest Port 1 View  12/14/2014  CLINICAL DATA:  Pericardial effusion. EXAM: PORTABLE CHEST 1 VIEW COMPARISON:  12/13/2014 . FINDINGS: Left chest tube in stable position. Persistent cardiomegaly with bilateral pulmonary alveolar infiltrates consistent with congestive heart failure and pulmonary edema. Small pleural effusions are noted. No definite pneumothorax . No acute bony abnormality . IMPRESSION: 1. Persistent cardiomegaly and bilateral pulmonary alveolar infiltrates consistent with congestive heart failure. Small bilateral pleural effusions again noted . Persistent low lung volumes. 2. Left chest tube in stable position. No definite pneumothorax noted. Electronically Signed   By: [place]   On: 12/14/2014 07:43   Dg Chest Port 1 View  12/13/2014  CLINICAL DATA:  Pneumothorax, chest tube.  Pericardial effusion EXAM: PORTABLE CHEST 1 VIEW COMPARISON:  Radiograph 12/12/2014 FINDINGS: LEFT chest tube in place. No appreciable pneumothorax. Prominent cardiac silhouette is similar prior. LEFT upper lobe mass is difficult to define. There is bilateral pleural effusions not changed. IMPRESSION: 1. No interval change. 2. LEFT chest tube in place without pneumothorax. 3. Central  venous congestion and bilateral effusions. 4. Prominent cardiac silhouette is not changed. Electronically Signed   By: Suzy Bouchard M.D.   On: 12/13/2014 08:17   Dg Chest Port 1 View  12/12/2014  CLINICAL DATA:  Pneumothorax, chest tube present. History of pericardial effusion with cardiac tamponade. Cancer. EXAM: PORTABLE CHEST 1 VIEW COMPARISON:  12/11/2014 FINDINGS: Low lung volumes. Left chest tube.  No pneumothorax is seen. Known left upper lobe mass with lymphangitic spread in the left upper lobe. Superimposed right perihilar edema and/or infection. Additional patchy opacity in the right lower lobe, atelectasis versus pneumonia. Suspected small bilateral pleural effusions. Cardiomegaly. IMPRESSION: Left chest tube.  No pneumothorax is seen. Known left upper lobe mass with lymphangitic spread in the left upper lobe. Superimposed right perihilar edema and/or infection. Additional patchy right lower lobe opacity, atelectasis versus pneumonia. Small bilateral pleural effusions. Electronically Signed   By: Julian Hy M.D.   On: 12/12/2014 12:24   Dg Chest Port 1 View  12/11/2014  CLINICAL DATA:  Atelectasis EXAM: PORTABLE CHEST 1 VIEW COMPARISON:  12/10/2014 FINDINGS: Marked cardiopericardial enlargement. There is hazy opacity over the bilateral chest compatible with layering pleural effusions. New placement of drains over the chest post subxiphoid window. Hazy mass in the left upper chest in this patient with known adenocarcinoma. IMPRESSION: 1. Decreased cardiopericardial enlargement after pericardial effusion drainage/drain placement. 2. Lower lung volumes with bilateral layering pleural effusion. 3. Extensive left upper lobe mass. Electronically Signed   By: Monte Fantasia M.D.   On: 12/11/2014 14:23    CBC  Recent Labs Lab 12/10/14 1017  12/10/14 1508 12/11/14 0537 12/12/14 0546 12/13/14 0435 12/14/14 0325 12/15/14 0330  WBC 13.3*  < > 13.9* 12.7* 10.8* 10.7* 9.1 6.9  HGB  10.8*  < > 11.1* 10.5* 10.3* 10.0* 10.2* 10.7*  HCT 33.1*  < > 33.7* 32.3* 31.2* 32.1* 32.3* 33.9*  PLT 532*  < > 546* 516* 401* 325 366 325  MCV 85.3  < > 84.9 85.9 85.7 87.0 87.1 86.7  MCH 27.8  < > 28.0 27.9 28.3 27.1 27.5 27.4  MCHC 32.6  < > 32.9 32.5 33.0 31.2 31.6 31.6  RDW 16.1*  < > 15.8* 16.2* 16.3* 16.5* 16.5* 16.3*  LYMPHSABS  0.8*  --  1.0  --   --   --   --   --   MONOABS 1.1*  --  1.3*  --   --   --   --   --   EOSABS 0.0  --  0.0  --   --   --   --   --   BASOSABS 0.0  --  0.0  --   --   --   --   --   < > = values in this interval not displayed.  Chemistries   Recent Labs Lab 12/10/14 1508 12/11/14 0537 12/12/14 0546 12/13/14 0435 12/14/14 0325 12/15/14 0330 12/16/14 0805  NA 126* 129* 127* 129* 138 137 133*  K 4.6 4.3 4.5 4.4 3.8 3.8 3.8  CL 93* 97* 96* 97* 103 101 98*  CO2 23 21* '26 24 29 '$ 32 29  GLUCOSE 231* 222* 212* 152* 123* 118* 123*  BUN 42* 37* 23* '18 14 8 7  '$ CREATININE 1.11 1.04 0.84 0.72 0.65 0.56* 0.61  CALCIUM 8.7* 8.7* 8.2* 8.0* 8.1* 8.2* 8.7*  AST 169* 117*  --  40 38 36  --   ALT 505* 422*  --  193* 141* 113*  --   ALKPHOS 218* 192*  --  128* 118 124  --   BILITOT 3.3* 3.2*  --  2.2* 1.2 1.0  --    ------------------------------------------------------------------------------------------------------------------ estimated creatinine clearance is 120 mL/min (by C-G formula based on Cr of 0.61). ------------------------------------------------------------------------------------------------------------------ No results for input(s): HGBA1C in the last 72 hours. ------------------------------------------------------------------------------------------------------------------ No results for input(s): CHOL, HDL, LDLCALC, TRIG, CHOLHDL, LDLDIRECT in the last 72 hours. ------------------------------------------------------------------------------------------------------------------ No results for input(s): TSH, T4TOTAL, T3FREE, THYROIDAB in the last  72 hours.  Invalid input(s): FREET3 ------------------------------------------------------------------------------------------------------------------ No results for input(s): VITAMINB12, FOLATE, FERRITIN, TIBC, IRON, RETICCTPCT in the last 72 hours.  Coagulation profile  Recent Labs Lab 12/10/14 1508  INR 1.46    No results for input(s): DDIMER in the last 72 hours.  Cardiac Enzymes No results for input(s): CKMB, TROPONINI, MYOGLOBIN in the last 168 hours.  Invalid input(s): CK ------------------------------------------------------------------------------------------------------------------ Invalid input(s): [place]  12/14/14 2117 12/15/14 0831 12/15/14 1129 12/15/14 1527 12/15/14 2128 12/16/14 0803  GLUCAP 173* 152* 177* 182* 152* 46*     Itzayanna Kaster M.D. [facility] 12/16/2014, 10:59 AM  Pager: [phone] Between 7am to 7pm - call Pager - [phone]  After 7pm go to [email or web address] - password [email or web address]  Call night coverage person covering after 7pm

## 2014-12-16 NOTE — Progress Notes (Addendum)
Nutrition Follow-up  DOCUMENTATION CODES:   Not applicable  INTERVENTION:    Ensure Enlive PO BID, each supplement provides 350 kcal and 20 grams of protein  NUTRITION DIAGNOSIS:   Increased nutrient needs related to catabolic illness, cancer and cancer related treatments as evidenced by estimated needs.  Ongoing  GOAL:   Patient will meet greater than or equal to 90% of their needs  Progressing  MONITOR:   Diet advancement, Supplement acceptance, Weight trends, Labs, Skin, I & O's   ASSESSMENT:   48 y.o. male Hispanic, with recent diagnosis of lung cancer 10-2014, metastasis diseases to lung. He has been receiving tarceva and radiation treatments. He went to see his oncologist today for regular follow up and was found to have abnormal labs and was refer to ED for further evaluation. He has been having abdominal pain, epigastric,RU quadrant and supra pubic pain for 1 week. He has had poor oral intake. He report epigastric pain with meals and early satiety. Denies vomiting, diarrhea. Had small Bowel movement yesterday.   Patient reports that he is eating well, consuming almost all of his meals. Per documentation of intake, he is consuming 50% of meals. Would benefit from a PO supplement. Patient agreeable to try Ensure. Nutrition-Focused physical exam completed. Findings are mild fat depletion, no muscle depletion, and moderate edema.   Diet Order:  Diet regular Room service appropriate?: Yes; Fluid consistency:: Thin  Skin:  Reviewed, no issues  Last BM:  11/1  Height:   Ht Readings from Last 1 Encounters:  12/11/14 '5\' 8"'$  (1.727 m)    Weight:   Wt Readings from Last 1 Encounters:  12/16/14 183 lb 3.2 oz (83.1 kg)   12/11/14 192 lb 10.9 oz (87.4 kg)         Ideal Body Weight:  70 kg (kg)  BMI:  Body mass index is 27.86 kg/(m^2).  Estimated Nutritional Needs:   Kcal:  1898-4210  Protein:  90-105 grams  Fluid:  2-2.2 L/day  EDUCATION NEEDS:    Education needs addressed    Molli Barrows, [place], [place], [place] Pager [phone] After Hours Pager [phone]

## 2014-12-17 ENCOUNTER — Inpatient Hospital Stay (HOSPITAL_COMMUNITY): Payer: Medicaid Other

## 2014-12-17 ENCOUNTER — Ambulatory Visit: Payer: Medicaid Other

## 2014-12-17 ENCOUNTER — Ambulatory Visit: Payer: Self-pay | Admitting: Nurse Practitioner

## 2014-12-17 ENCOUNTER — Other Ambulatory Visit: Payer: Self-pay

## 2014-12-17 LAB — GLUCOSE, CAPILLARY
GLUCOSE-CAPILLARY: 257 mg/dL — AB (ref 65–99)
GLUCOSE-CAPILLARY: 161 mg/dL — AB (ref 65–99)
Glucose-Capillary: 110 mg/dL — ABNORMAL HIGH (ref 65–99)
Glucose-Capillary: 231 mg/dL — ABNORMAL HIGH (ref 65–99)

## 2014-12-17 LAB — CBC
HCT: 35.2 % — ABNORMAL LOW (ref 39.0–52.0)
Hemoglobin: 11.3 g/dL — ABNORMAL LOW (ref 13.0–17.0)
MCH: 28.1 pg (ref 26.0–34.0)
MCHC: 32.1 g/dL (ref 30.0–36.0)
MCV: 87.6 fL (ref 78.0–100.0)
PLATELETS: 286 10*3/uL (ref 150–400)
RBC: 4.02 MIL/uL — ABNORMAL LOW (ref 4.22–5.81)
RDW: 16.4 % — AB (ref 11.5–15.5)
WBC: 9.1 10*3/uL (ref 4.0–10.5)

## 2014-12-17 LAB — BASIC METABOLIC PANEL
Anion gap: 7 (ref 5–15)
BUN: 7 mg/dL (ref 6–20)
CO2: 28 mmol/L (ref 22–32)
CREATININE: 0.69 mg/dL (ref 0.61–1.24)
Calcium: 8.5 mg/dL — ABNORMAL LOW (ref 8.9–10.3)
Chloride: 103 mmol/L (ref 101–111)
GFR calc Af Amer: >60 mL/min (ref >60)
Glucose, Bld: 118 mg/dL — ABNORMAL HIGH (ref 65–99)
Potassium: 4.1 mmol/L (ref 3.5–5.1)
SODIUM: 138 mmol/L (ref 135–145)

## 2014-12-17 NOTE — Progress Notes (Signed)
[facility] PROGRESS NOTE  Henning Ehle SHF:[id number] DOB: 1965/04/17 DOA: 12/10/2014 PCP: Pcp Not In System  Brief narrative 49 year old Hispanic male with recently diagnosed lung cancer (10/2014) on Tarceva and radiation therapy who went to see his oncologist on the day of admission and found to have abnormal labs and was referred to the [facility] ED for further evaluation. He complained of his abdominal pain for one week with poor by mouth intake. He also lower extremity edema with abdominal distention for one week with orthopnea dyspnea on exertion. Abdominal ultrasound on admission showed 3 liver masses likely metastatic with cholelithiasis and right pleural effusion. MRCP was negative for biliary dilatation. He was started on empiric Unasyn. HIDA scan was ordered but could not be completed. A 2-D echo done after a massive pericardial effusion was noted on MRI of the abdomen showed a large free-flowing pericardial effusion with tamponade physiology. He was presented to [place] or to [facility], underwent subxiphoid pericardial window on 10/28 and admitted to stepdown unit.   Assessment/Plan: Pericardial effusion with tamponade physiology MRI abdomen with massive pericardial effusion and 2-D echo showing large free-flowing pericardial effusion with tamponade physiology. patient underwent subxyphoid pericardial window on 10/28. -Appreciate cardiothoracic follow-up. Inflammed fibroadipose tissue on cytology, no malignancy. Output improving. Removal of  chest tube per CTVS.  Abdominal pain with hyperbilirubinemia and transaminitis Possibly for cholelithiasis, cholecystitis versus head was reaction from Tarceva versus metastatic disease. Currently resolved. MRCP showing no biliary dilatation. HIDA scan unable to be completed. Holding Tarceva per oncology. LFTs improving. As per surgery given his significant medical comorbidity recommended no further gallbladder workup unless symptoms  reoccurred.  Acute unresponsive episode on 10/28 Possibly secondary to effusions causing him acute spells. No further symptoms. No further workup ordered.  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 Resolved  Lower extremity edema Possibly associated with liver failure. Lower extremity venous Dopplers negative for DVT. Improving with Lasix.  metastatic non-small cell lung cancer, adenocarcinoma with positive EGFR mutation Oncology following  Axis  Code Status: Full code Family Communication: Wife at bedside. Disposition Plan: Home once system removed. stepdown monitor for now.   Consultants:  Cardiothoracic surgery  Dr Julien Nordmann  Procedures:  Subxiphoid pericardial window  TEE  Antibiotics:  None  HPI/Subjective: Seen and examined. Reports some soreness over chest tube site. Leg Swelling improved.  Objective: Filed Vitals:   12/17/14 1300  BP:   Pulse:   Temp: 98.2 F (36.8 C)  Resp:     Intake/Output Summary (Last 24 hours) at 12/17/14 1422 Last data filed at 12/17/14 1217  Gross per 24 hour  Intake      0 ml  Output   1310 ml  Net  -1310 ml   Filed Weights   12/14/14 0500 12/15/14 0500 12/16/14 0500  Weight: 92.7 kg (204 lb 5.9 oz) 92 kg (202 lb 13.2 oz) 83.1 kg (183 lb 3.2 oz)    Exam:   General:  Middle aged male not in distress  HEENT: No pallor, moist oral mucosa, supple neck  Chest: Left-sided chest tube in place, clear to auscultation bilaterally  Cardiovascular: S1 and S2, no murmurs  GI: Soft, nondistended, nontender   musculoskeletal: Trace edema bilaterally (proved)  Data Reviewed: Basic Metabolic Panel:  Recent Labs Lab 12/13/14 0435 12/14/14 0325 12/15/14 0330 12/16/14 0805 12/17/14 0502  NA 129* 138 137 133* 138  K 4.4 3.8 3.8 3.8 4.1  CL 97* 103 101 98* 103  CO2 24 29 32 29 28  GLUCOSE 152*  123* 118* 123* 118*  BUN 18 14 8 7 7   CREATININE 0.72 0.65 0.56* 0.61 0.69  CALCIUM 8.0* 8.1* 8.2* 8.7* 8.5*   Liver  Function Tests:  Recent Labs Lab 12/10/14 1508 12/11/14 0537 12/13/14 0435 12/14/14 0325 12/15/14 0330  AST 169* 117* 40 38 36  ALT 505* 422* 193* 141* 113*  ALKPHOS 218* 192* 128* 118 124  BILITOT 3.3* 3.2* 2.2* 1.2 1.0  PROT 7.3 6.6 4.7* 4.6* 4.4*  ALBUMIN 3.4* 3.0* 2.1* 2.1* 2.0*    Recent Labs Lab 12/10/14 1508  LIPASE 51    Recent Labs Lab 12/11/14 0750  AMMONIA 36*   CBC:  Recent Labs Lab 12/10/14 1508  12/12/14 0546 12/13/14 0435 12/14/14 0325 12/15/14 0330 12/17/14 0502  WBC 13.9*  < > 10.8* 10.7* 9.1 6.9 9.1  NEUTROABS 11.6*  --   --   --   --   --   --   HGB 11.1*  < > 10.3* 10.0* 10.2* 10.7* 11.3*  HCT 33.7*  < > 31.2* 32.1* 32.3* 33.9* 35.2*  MCV 84.9  < > 85.7 87.0 87.1 86.7 87.6  PLT 546*  < > 401* 325 366 325 286  < > = values in this interval not displayed. Cardiac Enzymes: No results for input(s): CKTOTAL, CKMB, CKMBINDEX, TROPONINI in the last 168 hours. BNP (last 3 results) No results for input(s): BNP in the last 8760 hours.  ProBNP (last 3 results) No results for input(s): PROBNP in the last 8760 hours.  CBG:  Recent Labs Lab 12/16/14 1123 12/16/14 1532 12/16/14 2058 12/17/14 0805 12/17/14 1203  GLUCAP 130* 193* 156* 110* 231*    Recent Results (from the past 240 hour(s))  MRSA PCR Screening     Status: None   Collection Time: 12/11/14  8:44 AM  Result Value Ref Range Status   MRSA by PCR NEGATIVE NEGATIVE Final    Comment:        The GeneXpert MRSA Assay (FDA approved for NASAL specimens only), is one component of a comprehensive MRSA colonization surveillance program. It is not intended to diagnose MRSA infection nor to guide or monitor treatment for MRSA infections.   Fungus Culture with Smear     Status: None (Preliminary result)   Collection Time: 12/11/14 12:54 PM  Result Value Ref Range Status   Specimen Description FLUID PERICARDIAL  Final   Special Requests SPEC B PT ON UNASYN  Final   Fungal Smear    Final    NO YEAST OR FUNGAL ELEMENTS SEEN Performed at [facility]    Culture   Final    CULTURE IN PROGRESS FOR FOUR WEEKS Performed at [facility]    Report Status PENDING  Incomplete  AFB culture with smear     Status: None (Preliminary result)   Collection Time: 12/11/14 12:54 PM  Result Value Ref Range Status   Specimen Description FLUID PERICARDIAL  Final   Special Requests SPEC B PT ON UNASYN  Final   Acid Fast Smear   Final    NO ACID FAST BACILLI SEEN Performed at [facility]    Culture   Final    CULTURE WILL BE EXAMINED FOR 6 WEEKS BEFORE ISSUING A FINAL REPORT Performed at [facility]    Report Status PENDING  Incomplete  Culture, body fluid-bottle     Status: None   Collection Time: 12/11/14 12:54 PM  Result Value Ref Range Status   Specimen Description FLUID PERICARDIAL  Final   Special Requests NONE  Final   Culture NO GROWTH 5 DAYS  Final   Report Status 12/16/2014 FINAL  Final  Gram stain     Status: None   Collection Time: 12/11/14 12:54 PM  Result Value Ref Range Status   Specimen Description FLUID PERICARDIAL  Final   Special Requests NONE  Final   Gram Stain   Final    FEW WBC PRESENT,BOTH PMN AND MONONUCLEAR NO ORGANISMS SEEN    Report Status 12/11/2014 FINAL  Final     Studies: Dg Chest Port 1 View  12/17/2014  CLINICAL DATA:  Pericardial effusion. EXAM: PORTABLE CHEST 1 VIEW COMPARISON:  12/16/2014. FINDINGS: Left chest tube and pericardial drainage catheter stable position. Heart size stable. Progressive diffuse left lung infiltrate. Low lung volumes. No pleural effusion or pneumothorax. IMPRESSION: 1. Left chest tube and pericardial drainage catheter in stable position. 2.  Progressive diffuse left lung infiltrate. Electronically Signed   By: Marcello Moores  Register   On: 12/17/2014 07:46   Dg Chest Port 1 View  12/16/2014  CLINICAL DATA:  Follow-up pleural effusion, known stage IV adenocarcinoma of the lung EXAM:  PORTABLE CHEST 1 VIEW COMPARISON:  Portable chest x-ray of December 15, 2014 FINDINGS: The lungs remain mildly hypoinflated. The left-sided chest tube is unchanged with its tip projecting over the posterior medial aspect of the fourth rib. There is no definite pneumothorax. There is persistent interstitial and early alveolar airspace disease bilaterally. Slight interval improvement at the right lung base has occurred. No significant pleural effusion is observed. The cardiac silhouette is not enlarged. The pericardial drain is stable in appearance. IMPRESSION: Slight interval improvement in airspace opacities at the right lung base. Stable appearance of the left lung. The left-sided chest tube and the pericardial drain are is unchanged. Electronically Signed   By: David  Martinique M.D.   On: 12/16/2014 07:45    Scheduled Meds: . docusate sodium  100 mg Oral BID  . feeding supplement (ENSURE ENLIVE)  237 mL Oral BID BM  . insulin aspart  0-9 Units Subcutaneous TID WC  . sodium chloride  3 mL Intravenous Q12H   Continuous Infusions:     Time spent: 25 minutes    Louellen Molder  [facility] Pager [phone] If 7PM-7AM, please contact night-coverage at [email or web address], password [email or web address] 12/17/2014, 2:22 PM  LOS: 7 days

## 2014-12-17 NOTE — Progress Notes (Addendum)
       [address]       [place] [address]             [phone]          6 Days Post-Op Procedure(s) (LRB): SUBXYPHOID PERICARDIAL WINDOW (N/A) TRANSESOPHAGEAL ECHOCARDIOGRAM (TEE) (N/A)  Subjective: Feels well, no complaints.   Objective: Vital signs in last 24 hours: Patient Vitals for the past 24 hrs:  BP Temp Temp src Pulse Resp SpO2  12/17/14 0735 - 98.5 F (36.9 C) Oral - - -  12/17/14 0500 107/63 mmHg 98.7 F (37.1 C) Oral 97 - 98 %  12/16/14 2331 111/67 mmHg 98.8 F (37.1 C) Oral 97 - 98 %  12/16/14 2046 111/63 mmHg 98.5 F (36.9 C) Oral - - -  12/16/14 1531 100/61 mmHg - - 97 (!) 23 99 %  12/16/14 1529 - 98.2 F (36.8 C) Oral - - -  12/16/14 1125 114/67 mmHg - - 92 15 97 %  12/16/14 0808 - 98.2 F (36.8 C) Oral - - -  12/16/14 0806 107/66 mmHg - - 97 18 96 %   Current Weight  12/16/14 183 lb 3.2 oz (83.1 kg)     Intake/Output from previous day: 11/02 0701 - 11/03 0700 In: -  Out: 1100 [Urine:925; Chest Tube:175]    PHYSICAL EXAM:  Heart: RRR Lungs: Slightly diminished BS in bases but clear Wound: Clean and dry Chest tube: No air leak    Lab Results: CBC: Recent Labs  12/15/14 0330 12/17/14 0502  WBC 6.9 9.1  HGB 10.7* 11.3*  HCT 33.9* 35.2*  PLT 325 286   BMET:  Recent Labs  12/16/14 0805 12/17/14 0502  NA 133* 138  K 3.8 4.1  CL 98* 103  CO2 29 28  GLUCOSE 123* 118*  BUN 7 7  CREATININE 0.61 0.69  CALCIUM 8.7* 8.5*    PT/INR: No results for input(s): LABPROT, INR in the last 72 hours.    Assessment/Plan: S/P Procedure(s) (LRB): SUBXYPHOID PERICARDIAL WINDOW (N/A) TRANSESOPHAGEAL ECHOCARDIOGRAM (TEE) (N/A) CT output decreasing, only  50 ml serous fluid out of pericardial tube and 125 from pleural tube.  Discussed with MD- will leave pericardial tube in place until dry due to risk of recurrence. Hopefully if drainage continues to decrease, can d/c pleural tube soon. Cx's and path all  negative. Continue medical care per primary service.    LOS: 7 days    COLLINS,GINA H 12/17/2014  I have seen and examined the patient and agree with the assessment and plan as outlined.  Rexene Alberts, MD 12/17/2014

## 2014-12-17 NOTE — Progress Notes (Signed)
UR COMPLETED  

## 2014-12-18 ENCOUNTER — Inpatient Hospital Stay (HOSPITAL_COMMUNITY): Payer: Medicaid Other

## 2014-12-18 ENCOUNTER — Telehealth: Payer: Self-pay | Admitting: *Deleted

## 2014-12-18 ENCOUNTER — Ambulatory Visit: Payer: Medicaid Other

## 2014-12-18 LAB — BASIC METABOLIC PANEL
ANION GAP: 8 (ref 5–15)
BUN: 7 mg/dL (ref 6–20)
CALCIUM: 8.2 mg/dL — AB (ref 8.9–10.3)
CHLORIDE: 101 mmol/L (ref 101–111)
CO2: 28 mmol/L (ref 22–32)
Creatinine, Ser: 0.66 mg/dL (ref 0.61–1.24)
GFR calc non Af Amer: >60 mL/min (ref >60)
GLUCOSE: 138 mg/dL — AB (ref 65–99)
Potassium: 3.4 mmol/L — ABNORMAL LOW (ref 3.5–5.1)
Sodium: 137 mmol/L (ref 135–145)

## 2014-12-18 LAB — GLUCOSE, CAPILLARY
GLUCOSE-CAPILLARY: 185 mg/dL — AB (ref 65–99)
GLUCOSE-CAPILLARY: 148 mg/dL — AB (ref 65–99)
GLUCOSE-CAPILLARY: 177 mg/dL — AB (ref 65–99)
Glucose-Capillary: 116 mg/dL — ABNORMAL HIGH (ref 65–99)

## 2014-12-18 MED ORDER — POTASSIUM CHLORIDE CRYS ER 20 MEQ PO TBCR
40.0000 meq | EXTENDED_RELEASE_TABLET | Freq: Once | ORAL | Status: AC
  Administered 2014-12-18: 40 meq via ORAL
  Filled 2014-12-18: qty 2

## 2014-12-18 NOTE — Progress Notes (Addendum)
       [address]       [place] [address]             [phone]          7 Days Post-Op Procedure(s) (LRB): SUBXYPHOID PERICARDIAL WINDOW (N/A) TRANSESOPHAGEAL ECHOCARDIOGRAM (TEE) (N/A)  Subjective: No complaints.  Objective: Vital signs in last 24 hours: Patient Vitals for the past 24 hrs:  BP Temp Temp src Pulse Resp SpO2 Weight  12/18/14 0801 - - - - 13 - -  12/18/14 0733 - 98.3 F (36.8 C) Oral - - - -  12/18/14 0341 (!) 106/53 mmHg 98.7 F (37.1 C) Oral 94 (!) 30 98 % 172 lb 9.9 oz (78.3 kg)  12/17/14 2345 (!) 104/50 mmHg 99.1 F (37.3 C) Oral 98 16 95 % -  12/17/14 2005 113/66 mmHg 98 F (36.7 C) Oral - (!) 28 98 % -  12/17/14 1504 108/67 mmHg 98.8 F (37.1 C) Oral (!) 115 (!) 24 99 % -  12/17/14 1300 - 98.2 F (36.8 C) Oral - - - -  12/17/14 1217 (!) 100/58 mmHg 98.2 F (36.8 C) Oral 97 (!) 21 97 % -   Current Weight  12/18/14 172 lb 9.9 oz (78.3 kg)     Intake/Output from previous day: 11/03 0701 - 11/04 0700 In: -  Out: 2210 [Urine:2050; Chest Tube:160]    PHYSICAL EXAM:  Heart: RRR Lungs: Decreased BS in bases Wound: Clean and dry Chest tubes: No air leak   Lab Results: CBC: Recent Labs  12/17/14 0502  WBC 9.1  HGB 11.3*  HCT 35.2*  PLT 286   BMET:  Recent Labs  12/17/14 0502 12/18/14 0435  NA 138 137  K 4.1 3.4*  CL 103 101  CO2 28 28  GLUCOSE 118* 138*  BUN 7 7  CREATININE 0.69 0.66  CALCIUM 8.5* 8.2*    PT/INR: No results for input(s): LABPROT, INR in the last 72 hours.  CXR: FINDINGS: LEFT chest tube in place. Small LEFT apical pneumothorax measuring 13 mm the apical chest wall. Airspace disease in the LEFT upper lobe and LEFT lower lobe is unchanged. There is volume loss LEFT thorax. Cardiac silhouette is poorly defined on the LEFT and normal on the RIGHT. RIGHT lung is clear.  IMPRESSION: 1. Small LEFT apical pneumothorax with chest tube in place. 2. Stable airspace disease in the LEFT  lung with volume loss These results will be called to the ordering clinician or representative by the Radiologist Assistant, and communication documented in the PACS or zVision Dashboard.   Assessment/Plan: S/P Procedure(s) (LRB): SUBXYPHOID PERICARDIAL WINDOW (N/A) TRANSESOPHAGEAL ECHOCARDIOGRAM (TEE) (N/A) CT #1=100 ml/24 hrs, CT #2=60 ml. Per Dr. Roxy Manns, will leave CTs in until dry due to risk of recurrent effusion. Continue current care.   LOS: 8 days    COLLINS,GINA H 12/18/2014  Patient and medical record reviewed,agree with above note. Tharon Aquas Trigt III 12/18/2014

## 2014-12-18 NOTE — Progress Notes (Signed)
Portable chest x-ray from results called from radiology- 1. Small LEFT apical pneumothorax with chest tube in place. 2. Stable airspace disease in the LEFT lung with volume loss. Suzzanne Cloud PA paged, awaiting return call. Patient stable, no distress and sats 95-100% on room air. Will continue to monitor.

## 2014-12-18 NOTE — Telephone Encounter (Signed)
-----   Message from Curt Bears, MD sent at 12/13/2014  2:46 PM EDT ----- Call patient with the result and ask him to hold his treatment with Tarceva for 5 days until I see him again later this week.

## 2014-12-18 NOTE — Telephone Encounter (Signed)
Pt was admitted 10/27.  Still admitted as of 12/18/14

## 2014-12-18 NOTE — Progress Notes (Signed)
Inpatient Diabetes Program Recommendations  AACE/ADA: New Consensus Statement on Inpatient Glycemic Control (2015)  Target Ranges:  Prepandial:   less than 140 mg/dL      Peak postprandial:   less than 180 mg/dL (1-2 hours)      Critically ill patients:  140 - 180 mg/dL   Review of Glycemic Control:  Results for Steven Roberts, PEOPLE (MRN [id number]) as of 12/18/2014 11:26  Ref. Range 12/17/2014 08:05 12/17/2014 12:03 12/17/2014 15:03 12/17/2014 22:02 12/18/2014 07:59  Glucose-Capillary Latest Ref Range: 65-99 mg/dL 110 (H) 231 (H) 257 (H) 161 (H) 116 (H)   Results for Steven Roberts, BAIL (MRN [id number]) as of 12/18/2014 11:26  Ref. Range 12/10/2014 15:08  Hemoglobin A1C Latest Ref Range: 4.8-5.6 % 8.0 (H)   Diabetes history: No previous history of diabetes noted however A1C elevated. Outpatient Diabetes medications: None Current orders for Inpatient glycemic control:  Novolog sensitive tid with meals and HS  Inpatient Diabetes Program Recommendations:   Note newly elevated A1C.  May consider low dose Amaryl 2 mg daily if appropriate for discharge.  Patient will also need glucose meter and follow-up with PCP regarding elevated blood sugars and A1C.  Will follow.  Thanks, Adah Perl, RN, BC-ADM Inpatient Diabetes Coordinator Pager [phone] (8a-5p)

## 2014-12-18 NOTE — Progress Notes (Addendum)
[facility] PROGRESS NOTE  Steven Roberts MHD:[id number] DOB: 1966/01/11 DOA: 12/10/2014 PCP: Pcp Not In System  Brief narrative 49 year old Hispanic male with recently diagnosed lung cancer (10/2014) on Tarceva and radiation therapy who went to see his oncologist on the day of admission and found to have abnormal labs and was referred to the [facility] ED for further evaluation. He complained of his abdominal pain for one week with poor by mouth intake. He also lower extremity edema with abdominal distention for one week with orthopnea dyspnea on exertion. Abdominal ultrasound on admission showed 3 liver masses likely metastatic with cholelithiasis and right pleural effusion. MRCP was negative for biliary dilatation. He was started on empiric Unasyn. HIDA scan was ordered but could not be completed. A 2-D echo done after a massive pericardial effusion was noted on MRI of the abdomen showed a large free-flowing pericardial effusion with tamponade physiology. He was presented to [place] or to [facility], underwent subxiphoid pericardial window on 10/28 and admitted to stepdown unit.   Assessment/Plan: Pericardial effusion with tamponade physiology MRI abdomen showed massive pericardial effusion and 2-D echo showing large free-flowing pericardial effusion with tamponade physiology. patient underwent subxyphoid pericardial window on 10/28. -Appreciate cardiothoracic consultation and follow-up. Inflammed fibroadipose tissue on cytology, no malignancy. Output still 160 mL total. Plan to keep chest tube until dry. -Patient comfortable. Left apical pneumothorax seen postop is stable on follow-up x-ray.  Abdominal pain with hyperbilirubinemia and transaminitis Possibly for cholelithiasis, cholecystitis versus head was reaction from Tarceva versus metastatic disease. Currently resolved. MRCP showing no biliary dilatation. HIDA scan unable to be completed. Holding Tarceva per oncology. LFTs improving. As per  surgery given his significant medical comorbidity recommended no further gallbladder workup unless symptoms reoccurred.  Acute unresponsive episode on 10/28 Possibly secondary to effusions causing him acute spells. No further symptoms. No further workup ordered.  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 Resolved  Lower extremity edema Possibly associated with liver failure. Lower extremity venous Dopplers negative for DVT. Improving with Lasix.  metastatic non-small cell lung cancer, adenocarcinoma with positive EGFR mutation Oncology following  Hypokalemia Replenished  Diet: Regular DVT prophylaxis: SCDs  Code Status: Full code Family Communication: Wife at bedside. Disposition Plan: Home once chest tube removed   Consultants:  Cardiothoracic surgery  Dr Julien Nordmann  Procedures:  Subxiphoid pericardial window  TEE  Antibiotics:  None  HPI/Subjective: Seen and examined. Denies any symptoms  Objective: Filed Vitals:   12/18/14 0801  BP:   Pulse:   Temp:   Resp: 13    Intake/Output Summary (Last 24 hours) at 12/18/14 1127 Last data filed at 12/18/14 0900  Gross per 24 hour  Intake    360 ml  Output   2740 ml  Net  -2380 ml   Filed Weights   12/15/14 0500 12/16/14 0500 12/18/14 0341  Weight: 92 kg (202 lb 13.2 oz) 83.1 kg (183 lb 3.2 oz) 78.3 kg (172 lb 9.9 oz)    Exam:   General:  not in distress  HEENT: moist oral mucosa, supple neck  Chest: Left-sided chest tube in place, clear to auscultation bilaterally  Cardiovascular: S1 and S2, no murmurs  GI: Soft, nondistended, nontender   musculoskeletal: Improved Leg edema  Data Reviewed: Basic Metabolic Panel:  Recent Labs Lab 12/14/14 0325 12/15/14 0330 12/16/14 0805 12/17/14 0502 12/18/14 0435  NA 138 137 133* 138 137  K 3.8 3.8 3.8 4.1 3.4*  CL 103 101 98* 103 101  CO2 29 32 29 28 28  GLUCOSE 123* 118* 123* 118* 138*  BUN 14 8 7 7 7   CREATININE 0.65 0.56* 0.61 0.69 0.66   CALCIUM 8.1* 8.2* 8.7* 8.5* 8.2*   Liver Function Tests:  Recent Labs Lab 12/13/14 0435 12/14/14 0325 12/15/14 0330  AST 40 38 36  ALT 193* 141* 113*  ALKPHOS 128* 118 124  BILITOT 2.2* 1.2 1.0  PROT 4.7* 4.6* 4.4*  ALBUMIN 2.1* 2.1* 2.0*   No results for input(s): LIPASE, AMYLASE in the last 168 hours. No results for input(s): AMMONIA in the last 168 hours. CBC:  Recent Labs Lab 12/12/14 0546 12/13/14 0435 12/14/14 0325 12/15/14 0330 12/17/14 0502  WBC 10.8* 10.7* 9.1 6.9 9.1  HGB 10.3* 10.0* 10.2* 10.7* 11.3*  HCT 31.2* 32.1* 32.3* 33.9* 35.2*  MCV 85.7 87.0 87.1 86.7 87.6  PLT 401* 325 366 325 286   Cardiac Enzymes: No results for input(s): CKTOTAL, CKMB, CKMBINDEX, TROPONINI in the last 168 hours. BNP (last 3 results) No results for input(s): BNP in the last 8760 hours.  ProBNP (last 3 results) No results for input(s): PROBNP in the last 8760 hours.  CBG:  Recent Labs Lab 12/17/14 0805 12/17/14 1203 12/17/14 1503 12/17/14 2202 12/18/14 0759  GLUCAP 110* 231* 257* 161* 116*    Recent Results (from the past 240 hour(s))  MRSA PCR Screening     Status: None   Collection Time: 12/11/14  8:44 AM  Result Value Ref Range Status   MRSA by PCR NEGATIVE NEGATIVE Final    Comment:        The GeneXpert MRSA Assay (FDA approved for NASAL specimens only), is one component of a comprehensive MRSA colonization surveillance program. It is not intended to diagnose MRSA infection nor to guide or monitor treatment for MRSA infections.   Fungus Culture with Smear     Status: None (Preliminary result)   Collection Time: 12/11/14 12:54 PM  Result Value Ref Range Status   Specimen Description FLUID PERICARDIAL  Final   Special Requests [place] B PT ON UNASYN  Final   Fungal Smear   Final    NO YEAST OR FUNGAL ELEMENTS SEEN Performed at [facility]    Culture   Final    CULTURE IN PROGRESS FOR FOUR WEEKS Performed at [facility]    Report  Status PENDING  Incomplete  AFB culture with smear     Status: None (Preliminary result)   Collection Time: 12/11/14 12:54 PM  Result Value Ref Range Status   Specimen Description FLUID PERICARDIAL  Final   Special Requests SPEC B PT ON UNASYN  Final   Acid Fast Smear   Final    NO ACID FAST BACILLI SEEN Performed at [facility]    Culture   Final    CULTURE WILL BE EXAMINED FOR 6 WEEKS BEFORE ISSUING A FINAL REPORT Performed at [facility]    Report Status PENDING  Incomplete  Culture, body fluid-bottle     Status: None   Collection Time: 12/11/14 12:54 PM  Result Value Ref Range Status   Specimen Description FLUID PERICARDIAL  Final   Special Requests NONE  Final   Culture NO GROWTH 5 DAYS  Final   Report Status 12/16/2014 FINAL  Final  Gram stain     Status: None   Collection Time: 12/11/14 12:54 PM  Result Value Ref Range Status   Specimen Description FLUID PERICARDIAL  Final   Special Requests NONE  Final   Gram Stain  Final    FEW WBC PRESENT,BOTH PMN AND MONONUCLEAR NO ORGANISMS SEEN    Report Status 12/11/2014 FINAL  Final     Studies: Dg Chest Port 1 View  12/18/2014  CLINICAL DATA:  Pericardial effusion EXAM: PORTABLE CHEST 1 VIEW COMPARISON:  Radiograph 12/16/2014, 12/17/2014 FINDINGS: LEFT chest tube in place. Small LEFT apical pneumothorax measuring 13 mm the apical chest wall. Airspace disease in the LEFT upper lobe and LEFT lower lobe is unchanged. There is volume loss LEFT thorax. Cardiac silhouette is poorly defined on the LEFT and normal on the RIGHT. RIGHT lung is clear. IMPRESSION: 1. Small LEFT apical pneumothorax with chest tube in place. 2. Stable airspace disease in the LEFT lung with volume loss These results will be called to the ordering clinician or representative by the Radiologist Assistant, and communication documented in the PACS or zVision Dashboard. Electronically Signed   By: Suzy Bouchard M.D.   On: 12/18/2014 07:54   Dg  Chest Port 1 View  12/17/2014  CLINICAL DATA:  Pericardial effusion. EXAM: PORTABLE CHEST 1 VIEW COMPARISON:  12/16/2014. FINDINGS: Left chest tube and pericardial drainage catheter stable position. Heart size stable. Progressive diffuse left lung infiltrate. Low lung volumes. No pleural effusion or pneumothorax. IMPRESSION: 1. Left chest tube and pericardial drainage catheter in stable position. 2.  Progressive diffuse left lung infiltrate. Electronically Signed   By: Marcello Moores  Register   On: 12/17/2014 07:46    Scheduled Meds: . docusate sodium  100 mg Oral BID  . feeding supplement (ENSURE ENLIVE)  237 mL Oral BID BM  . insulin aspart  0-9 Units Subcutaneous TID WC  . sodium chloride  3 mL Intravenous Q12H   Continuous Infusions:     Time spent: 25 minutes    Louellen Molder  [facility] Pager [phone] If 7PM-7AM, please contact night-coverage at [email or web address], password [facility] 12/18/2014, 11:27 AM  LOS: 8 days

## 2014-12-19 ENCOUNTER — Inpatient Hospital Stay (HOSPITAL_COMMUNITY): Payer: Medicaid Other

## 2014-12-19 DIAGNOSIS — E119 Type 2 diabetes mellitus without complications: Secondary | ICD-10-CM

## 2014-12-19 LAB — GLUCOSE, CAPILLARY
GLUCOSE-CAPILLARY: 123 mg/dL — AB (ref 65–99)
GLUCOSE-CAPILLARY: 148 mg/dL — AB (ref 65–99)
Glucose-Capillary: 148 mg/dL — ABNORMAL HIGH (ref 65–99)
Glucose-Capillary: 117 mg/dL — ABNORMAL HIGH (ref 65–99)

## 2014-12-19 LAB — HEPATIC FUNCTION PANEL
ALBUMIN: 2.2 g/dL — AB (ref 3.5–5.0)
ALK PHOS: 124 U/L (ref 38–126)
ALT: 73 U/L — ABNORMAL HIGH (ref 17–63)
AST: 39 U/L (ref 15–41)
BILIRUBIN DIRECT: 0.2 mg/dL (ref 0.1–0.5)
BILIRUBIN TOTAL: 0.8 mg/dL (ref 0.3–1.2)
Indirect Bilirubin: 0.6 mg/dL (ref 0.3–0.9)
Total Protein: 5.1 g/dL — ABNORMAL LOW (ref 6.5–8.1)

## 2014-12-19 LAB — CBC
HCT: 34.7 % — ABNORMAL LOW (ref 39.0–52.0)
Hemoglobin: 10.8 g/dL — ABNORMAL LOW (ref 13.0–17.0)
MCH: 27.1 pg (ref 26.0–34.0)
MCHC: 31.1 g/dL (ref 30.0–36.0)
MCV: 87 fL (ref 78.0–100.0)
Platelets: 270 10*3/uL (ref 150–400)
RBC: 3.99 MIL/uL — ABNORMAL LOW (ref 4.22–5.81)
RDW: 16.4 % — ABNORMAL HIGH (ref 11.5–15.5)
WBC: 8.1 10*3/uL (ref 4.0–10.5)

## 2014-12-19 LAB — BASIC METABOLIC PANEL
Anion gap: 8 (ref 5–15)
BUN: 9 mg/dL (ref 6–20)
CHLORIDE: 102 mmol/L (ref 101–111)
CO2: 28 mmol/L (ref 22–32)
Calcium: 8.4 mg/dL — ABNORMAL LOW (ref 8.9–10.3)
Creatinine, Ser: 0.56 mg/dL — ABNORMAL LOW (ref 0.61–1.24)
GFR calc Af Amer: >60 mL/min (ref >60)
GFR calc non Af Amer: >60 mL/min (ref >60)
GLUCOSE: 115 mg/dL — AB (ref 65–99)
POTASSIUM: 3.8 mmol/L (ref 3.5–5.1)
SODIUM: 138 mmol/L (ref 135–145)

## 2014-12-19 NOTE — Progress Notes (Signed)
Dr. Prescott Gum was at bedside and marked the tube to be pulled. Pain medication administered prior to chest tube removal per patient request. Chest tube removed per order. Patient tolerated procedure well. Will cont to monitor.

## 2014-12-19 NOTE — Progress Notes (Signed)
[facility] PROGRESS NOTE  Steven Roberts OMV:[id number] DOB: 08-Apr-1965 DOA: 12/10/2014 PCP: Pcp Not In System  Brief narrative 49 year old Hispanic male with recently diagnosed lung cancer (10/2014) on Tarceva and radiation therapy who went to see his oncologist on the day of admission and found to have abnormal labs and was referred to the [facility] ED for further evaluation. He complained of his abdominal pain for one week with poor by mouth intake. He also lower extremity edema with abdominal distention for one week with orthopnea dyspnea on exertion. Abdominal ultrasound on admission showed 3 liver masses likely metastatic with cholelithiasis and right pleural effusion. MRCP was negative for biliary dilatation. He was started on empiric Unasyn. HIDA scan was ordered but could not be completed. A 2-D echo done after a massive pericardial effusion was noted on MRI of the abdomen showed a large free-flowing pericardial effusion with tamponade physiology. He was presented to [place] or to [facility], underwent subxiphoid pericardial window on 10/28 and admitted to stepdown unit.   Assessment/Plan: Pericardial effusion with tamponade physiology MRI abdomen showed massive pericardial effusion and 2-D echo showing large free-flowing pericardial effusion with tamponade physiology. patient underwent subxyphoid pericardial window on 10/28. -Appreciate cardiothoracic consultation and follow-up. Inflammed fibroadipose tissue on cytology, no malignancy. -Chest tube output documented of 2 80 mL past 24 hours. Chest tube removal to be decided by  surgery. -Patient comfortable. Left apical pneumothorax seen yesterday has resolved.  Abdominal pain with hyperbilirubinemia and transaminitis Possibly for cholelithiasis, cholecystitis versus head was reaction from Tarceva versus metastatic disease. Currently resolved. MRCP showing no biliary dilatation. HIDA scan unable to be completed. Holding Tarceva per  oncology. LFTs improving. Monitor today. As per surgery given his significant medical comorbidity recommended no further gallbladder workup unless symptoms reoccurred.  Acute unresponsive episode on 10/28 Possibly secondary to effusions causing him acute spells. No further symptoms. No further workup ordered.  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 Resolved  Lower extremity edema Possibly associated with liver failure. Lower extremity venous Dopplers negative for DVT. Improving with a dose of Lasix.  metastatic non-small cell lung cancer, adenocarcinoma with positive EGFR mutation Oncology following  Diabetes mellitus type 2 - seems to have prediabetes earlier. Recent A1c of 8. Currently on sliding scale insulin. Will discharge him on Amaryl. Has glucometer and other diabetic supplies at home.  Hypokalemia Replenished  Bilateral leg pain  involving b/l plantar area ? Fascitis. continue prn pain control  Diet: Regular DVT prophylaxis: SCDs  Code Status: Full code Family Communication: Wife at bedside. Disposition Plan: Home once chest tube removed   Consultants:  Cardiothoracic surgery  Dr Julien Nordmann  Procedures:  Subxiphoid pericardial window  TEE  Antibiotics:  None  HPI/Subjective: Seen and examined. Plans of bilateral foot pain involving his plantar surface worsened on with bathing and ambulation.  Objective: Filed Vitals:   12/19/14 1058  BP:   Pulse:   Temp: 98 F (36.7 C)  Resp:     Intake/Output Summary (Last 24 hours) at 12/19/14 1108 Last data filed at 12/19/14 0952  Gross per 24 hour  Intake   1320 ml  Output   1575 ml  Net   -255 ml   Filed Weights   12/15/14 0500 12/16/14 0500 12/18/14 0341  Weight: 92 kg (202 lb 13.2 oz) 83.1 kg (183 lb 3.2 oz) 78.3 kg (172 lb 9.9 oz)    Exam:   General:  not in distress  HEENT: moist oral mucosa, supple neck  Chest: Left-sided chest  tube in place, diminished breath sounds on the  left  Cardiovascular: S1 and S2, no murmurs  GI: Soft, nondistended, nontender  musculoskeletal: No edema,, tender to pressure over bilateral plantar area. No swelling. Normal ROM.  Data Reviewed: Basic Metabolic Panel:  Recent Labs Lab 12/15/14 0330 12/16/14 0805 12/17/14 0502 12/18/14 0435 12/19/14 0336  NA 137 133* 138 137 138  K 3.8 3.8 4.1 3.4* 3.8  CL 101 98* 103 101 102  CO2 32 _0 GLUCOSE 118* 123* 118* 138* 115*  BUN _1 CREATININE 0.56* 0.61 0.69 0.66 0.56*  CALCIUM 8.2* 8.7* 8.5* 8.2* 8.4*   Liver Function Tests:  Recent Labs Lab 12/13/14 0435 12/14/14 0325 12/15/14 0330  AST 40 38 36  ALT 193* 141* 113*  ALKPHOS 128* 118 124  BILITOT 2.2* 1.2 1.0  PROT 4.7* 4.6* 4.4*  ALBUMIN 2.1* 2.1* 2.0*   No results for input(s): LIPASE, AMYLASE in the last 168 hours. No results for input(s): AMMONIA in the last 168 hours. CBC:  Recent Labs Lab 12/13/14 0435 12/14/14 0325 12/15/14 0330 12/17/14 0502 12/19/14 0336  WBC 10.7* 9.1 6.9 9.1 8.1  HGB 10.0* 10.2* 10.7* 11.3* 10.8*  HCT 32.1* 32.3* 33.9* 35.2* 34.7*  MCV 87.0 87.1 86.7 87.6 87.0  PLT 325 366 325 286 270   Cardiac Enzymes: No results for input(s): CKTOTAL, CKMB, CKMBINDEX, TROPONINI in the last 168 hours. BNP (last 3 results) No results for input(s): BNP in the last 8760 hours.  ProBNP (last 3 results) No results for input(s): PROBNP in the last 8760 hours.  CBG:  Recent Labs Lab 12/18/14 0759 12/18/14 1206 12/18/14 1704 12/18/14 2202 12/19/14 0802  GLUCAP 116* 185* 148* 177* 123*    Recent Results (from the past 240 hour(s))  MRSA PCR Screening     Status: None   Collection Time: 12/11/14  8:44 AM  Result Value Ref Range Status   MRSA by PCR NEGATIVE NEGATIVE Final    Comment:        The GeneXpert MRSA Assay (FDA approved for NASAL specimens only), is one component of a comprehensive MRSA colonization surveillance program. It is not intended to diagnose  MRSA infection nor to guide or monitor treatment for MRSA infections.   Fungus Culture with Smear     Status: None (Preliminary result)   Collection Time: 12/11/14 12:54 PM  Result Value Ref Range Status   Specimen Description FLUID PERICARDIAL  Final   Special Requests SPEC B PT ON UNASYN  Final   Fungal Smear   Final    NO YEAST OR FUNGAL ELEMENTS SEEN Performed at [facility]    Culture   Final    CULTURE IN PROGRESS FOR FOUR WEEKS Performed at [facility]    Report Status PENDING  Incomplete  AFB culture with smear     Status: None (Preliminary result)   Collection Time: 12/11/14 12:54 PM  Result Value Ref Range Status   Specimen Description FLUID PERICARDIAL  Final   Special Requests SPEC B PT ON UNASYN  Final   Acid Fast Smear   Final    NO ACID FAST BACILLI SEEN Performed at [facility]    Culture   Final    CULTURE WILL BE EXAMINED FOR 6 WEEKS BEFORE ISSUING A FINAL REPORT Performed at [facility]    Report Status PENDING  Incomplete  Culture, body fluid-bottle     Status: None  Collection Time: 12/11/14 12:54 PM  Result Value Ref Range Status   Specimen Description FLUID PERICARDIAL  Final   Special Requests NONE  Final   Culture NO GROWTH 5 DAYS  Final   Report Status 12/16/2014 FINAL  Final  Gram stain     Status: None   Collection Time: 12/11/14 12:54 PM  Result Value Ref Range Status   Specimen Description FLUID PERICARDIAL  Final   Special Requests NONE  Final   Gram Stain   Final    FEW WBC PRESENT,BOTH PMN AND MONONUCLEAR NO ORGANISMS SEEN    Report Status 12/11/2014 FINAL  Final     Studies: Dg Chest Port 1 View  12/19/2014  CLINICAL DATA:  49 year old male with a history of left pneumothorax and thoracostomy tube. Known left lung mass and adenopathy, better characterized on prior PET-CT EXAM: PORTABLE CHEST 1 VIEW COMPARISON:  Multiple prior plain film, most recent 12/18/2014. FINDINGS: Cardiomediastinal  silhouette likely unchanged, with the left heart border obscured by overlying lung/ pleural disease. Dense left-sided opacity, similar to the comparison studies. Since the prior study there has been resolution of the left-sided pneumothorax. Right lung relatively well aerated with low lung volume. No confluent airspace disease on the right. IMPRESSION: Unchanged position of right-sided thoracostomy tube, with interval resolution of right-sided pneumothorax. Dense left-sided opacity, likely a combination of known tumor, treatment effects, and airspace consolidation. Signed, Dulcy Fanny. Earleen Newport, DO [facility] Electronically Signed   By: Corrie Mckusick D.O.   On: 12/19/2014 09:02   Dg Chest Port 1 View  12/18/2014  CLINICAL DATA:  Pericardial effusion EXAM: PORTABLE CHEST 1 VIEW COMPARISON:  Radiograph 12/16/2014, 12/17/2014 FINDINGS: LEFT chest tube in place. Small LEFT apical pneumothorax measuring 13 mm the apical chest wall. Airspace disease in the LEFT upper lobe and LEFT lower lobe is unchanged. There is volume loss LEFT thorax. Cardiac silhouette is poorly defined on the LEFT and normal on the RIGHT. RIGHT lung is clear. IMPRESSION: 1. Small LEFT apical pneumothorax with chest tube in place. 2. Stable airspace disease in the LEFT lung with volume loss These results will be called to the ordering clinician or representative by the Radiologist Assistant, and communication documented in the PACS or zVision Dashboard. Electronically Signed   By: Suzy Bouchard M.D.   On: 12/18/2014 07:54    Scheduled Meds: . docusate sodium  100 mg Oral BID  . feeding supplement (ENSURE ENLIVE)  237 mL Oral BID BM  . insulin aspart  0-9 Units Subcutaneous TID WC  . sodium chloride  3 mL Intravenous Q12H   Continuous Infusions:     Time spent: 25 minutes    Louellen Molder  [facility] Pager [phone] If 7PM-7AM, please contact night-coverage at  [email or web address], password [facility] 12/19/2014, 11:08 AM  LOS: 9 days

## 2014-12-19 NOTE — Progress Notes (Addendum)
      [address]       [place] [address]             [phone]      8 Days Post-Op Procedure(s) (LRB): SUBXYPHOID PERICARDIAL WINDOW (N/A) TRANSESOPHAGEAL ECHOCARDIOGRAM (TEE) (N/A)   Subjective:  Mr. Meneely has no complaints.    Objective: Vital signs in last 24 hours: Temp:  [98 F (36.7 C)-98.7 F (37.1 C)] 98 F (36.7 C) (11/05 0800) Pulse Rate:  [89-105] 89 (11/05 0800) Cardiac Rhythm:  [-] Sinus tachycardia;Normal sinus rhythm (11/05 0816) Resp:  [18-26] 22 (11/05 0800) BP: (98-115)/(53-71) 98/56 mmHg (11/05 0800) SpO2:  [96 %-98 %] 96 % (11/05 0800)  Intake/Output from previous day: 11/04 0701 - 11/05 0700 In: 1674 [P.O.:1674] Out: 1880 [Urine:1600; Chest Tube:280] Intake/Output this shift: Total I/O In: 243 [P.O.:240; I.V.:3] Out: 225 [Urine:225]  General appearance: alert, cooperative and no distress Heart: regular rate and rhythm Lungs: clear to auscultation bilaterally Wound: clean andd ry  Lab Results:  Recent Labs  12/17/14 0502 12/19/14 0336  WBC 9.1 8.1  HGB 11.3* 10.8*  HCT 35.2* 34.7*  PLT 286 270   BMET:  Recent Labs  12/18/14 0435 12/19/14 0336  NA 137 138  K 3.4* 3.8  CL 101 102  CO2 28 28  GLUCOSE 138* 115*  BUN 7 9  CREATININE 0.66 0.56*  CALCIUM 8.2* 8.4*    PT/INR: No results for input(s): LABPROT, INR in the last 72 hours. ABG    Component Value Date/Time   PHART 7.461* 12/12/2014 0529   HCO3 20.6 12/12/2014 0529   TCO2 21.5 12/12/2014 0529   ACIDBASEDEF 2.6* 12/12/2014 0529   O2SAT 99.3 12/12/2014 0529   CBG (last 3)   Recent Labs  12/18/14 1704 12/18/14 2202 12/19/14 0802  GLUCAP 148* 177* 123*    Assessment/Plan: S/P Procedure(s) (LRB): SUBXYPHOID PERICARDIAL WINDOW (N/A) TRANSESOPHAGEAL ECHOCARDIOGRAM (TEE) (N/A)  1.Chest tube- no output recorded in computer... I have marked pleurovac if continues to be no output then we can remove chest tubes tomorrow   LOS: 9 days     BARRETT, ERIN 12/19/2014  DC PT today DC pericardial tube tomorrow then DC. patient examined and medical record reviewed,agree with above note. Tharon Aquas Trigt III 12/19/2014

## 2014-12-20 ENCOUNTER — Inpatient Hospital Stay (HOSPITAL_COMMUNITY): Payer: Medicaid Other

## 2014-12-20 DIAGNOSIS — E119 Type 2 diabetes mellitus without complications: Secondary | ICD-10-CM

## 2014-12-20 DIAGNOSIS — C3492 Malignant neoplasm of unspecified part of left bronchus or lung: Secondary | ICD-10-CM

## 2014-12-20 DIAGNOSIS — E876 Hypokalemia: Secondary | ICD-10-CM

## 2014-12-20 LAB — GLUCOSE, CAPILLARY
GLUCOSE-CAPILLARY: 128 mg/dL — AB (ref 65–99)
Glucose-Capillary: 206 mg/dL — ABNORMAL HIGH (ref 65–99)

## 2014-12-20 MED ORDER — OXYCODONE HCL 5 MG PO TABS: 5.0000 mg | ORAL_TABLET | ORAL | Status: DC | PRN

## 2014-12-20 MED ORDER — GLIMEPIRIDE 2 MG PO TABS: 2.0000 mg | ORAL_TABLET | Freq: Every day | ORAL | Status: DC

## 2014-12-20 MED ORDER — ENSURE ENLIVE PO LIQD: 237.0000 mL | Freq: Two times a day (BID) | ORAL | Status: DC

## 2014-12-20 MED ORDER — HYDROCODONE-ACETAMINOPHEN 5-325 MG PO TABS: 2.0000 | ORAL_TABLET | ORAL | Status: DC | PRN

## 2014-12-20 NOTE — Progress Notes (Signed)
      [address]       [place] [address]             [phone]      9 Days Post-Op Procedure(s) (LRB): SUBXYPHOID PERICARDIAL WINDOW (N/A) TRANSESOPHAGEAL ECHOCARDIOGRAM (TEE) (N/A)   Subjective:  No complaints.  Wants to go home today.  Objective: Vital signs in last 24 hours: Temp:  [98 F (36.7 C)-98.2 F (36.8 C)] 98.2 F (36.8 C) (11/06 0742) Pulse Rate:  [93-99] 98 (11/06 0742) Cardiac Rhythm:  [-] Normal sinus rhythm (11/06 0817) Resp:  [14-23] 21 (11/06 0742) BP: (97-115)/(48-60) 105/58 mmHg (11/06 0742) SpO2:  [94 %-98 %] 97 % (11/06 0742) Weight:  [177 lb 7.5 oz (80.5 kg)] 177 lb 7.5 oz (80.5 kg) (11/06 0500)  Intake/Output from previous day: 11/05 0701 - 11/06 0700 In: 1563 [P.O.:1560; I.V.:3] Out: 2665 [Urine:2625; Chest Tube:40] Intake/Output this shift: Total I/O In: 483 [P.O.:480; I.V.:3] Out: 235 [Urine:225; Chest Tube:10]  General appearance: alert, cooperative and no distress Heart: regular rate and rhythm Lungs: clear to auscultation bilaterally Abdomen: soft, non-tender; bowel sounds normal; no masses,  no organomegaly Wound: clean and dry  Lab Results:  Recent Labs  12/19/14 0336  WBC 8.1  HGB 10.8*  HCT 34.7*  PLT 270   BMET:  Recent Labs  12/18/14 0435 12/19/14 0336  NA 137 138  K 3.4* 3.8  CL 101 102  CO2 28 28  GLUCOSE 138* 115*  BUN 7 9  CREATININE 0.66 0.56*  CALCIUM 8.2* 8.4*    PT/INR: No results for input(s): LABPROT, INR in the last 72 hours. ABG    Component Value Date/Time   PHART 7.461* 12/12/2014 0529   HCO3 20.6 12/12/2014 0529   TCO2 21.5 12/12/2014 0529   ACIDBASEDEF 2.6* 12/12/2014 0529   O2SAT 99.3 12/12/2014 0529   CBG (last 3)   Recent Labs  12/19/14 1718 12/19/14 2153 12/20/14 0759  GLUCAP 117* 148* 128*    Assessment/Plan: S/P Procedure(s) (LRB): SUBXYPHOID PERICARDIAL WINDOW (N/A) TRANSESOPHAGEAL ECHOCARDIOGRAM (TEE) (N/A)  1. Chest tube- no output- will  remove final chest tube today 2. Dispo- repeat CXR at 1300 if okay, can d/c patient home today if okay with primary, follow up with Dr. Roxy Manns in 1 week for suture removal, 2 weeks for follow up.... Our office will contact patient with appoitment   LOS: 10 days    Ahmed Prima, Junie Panning 12/20/2014

## 2014-12-20 NOTE — Progress Notes (Signed)
Patient discharge instructions reviewed with patient and wife-Marcel who was at bedside in [place]. Patient verbalized understanding of all instructions. Patient and wife asked appropriate questions regarding incision care, all questions answered to their satisfaction. Wife understood to call Dr. Mora Appl, oncology, and Dr. Roxy Manns, CTS, to set up appointment. Follow up visits with medical doctors was emphasized and both parties verbally acknowledge to follow up with doctors. Prescription for pain medication explained. Patient was given new medication for diabetes control glimepiride- medication handout was located in [employer] and was printed out in [place] for the patient. How the medication works and when to take the medication explained and patient was participating in talk. Patient and wife verbalized understanding of new medication.   VSS. Pt in no acute distress. Patient discharged via wheelchair.

## 2014-12-20 NOTE — Discharge Summary (Addendum)
Physician Discharge Summary  Steven Roberts IEP:[id number] DOB: Jan 04, 1966 DOA: 12/10/2014  PCP: Pcp Not In System  Admit date: 12/10/2014 Discharge date: 12/20/2014  Time spent: 35 minutes  Recommendations for Outpatient Follow-up:  1. Discharge home with outpatient follow-up with Dr. Patrick Jupiter in 2 weeks and Dr. Julien Nordmann in 1 week. Patient will resume his Tarceva once he follows up with Dr. Joeseph Amor.  Discharge Diagnoses:  Principal Problem:   Pericardial effusion with cardiac tamponade   Active Problems:   Non-small cell carcinoma of lung, stage 4 (HCC)   Pulmonary infiltrates  c/w Adenoca/ Stage IV    Hyperglycemia   Hyperphosphatemia   Peripheral edema   Hypoalbuminemia due to protein-calorie malnutrition (HCC)   Pain in the abdomen   Hyperbilirubinemia   Abnormal transaminases   Anasarca   Leukocytosis   Hyponatremia   Diabetes mellitus type 2 in nonobese [facility])   Hypokalemia   Discharge Condition: Fair  Diet recommendation: Diabetic  Filed Weights   12/16/14 0500 12/18/14 0341 12/20/14 0500  Weight: 83.1 kg (183 lb 3.2 oz) 78.3 kg (172 lb 9.9 oz) 80.5 kg (177 lb 7.5 oz)    History of present illness:    Hospital Course:  49 year old Hispanic male with recently diagnosed lung cancer (10/2014) on Tarceva and radiation therapy who went to see his oncologist on the day of admission and found to have abnormal labs and was referred to the [facility] ED for further evaluation. He complained of his abdominal pain for one week with poor by mouth intake. He also lower extremity edema with abdominal distention for one week with orthopnea dyspnea on exertion. Abdominal ultrasound on admission showed 3 liver masses likely metastatic with cholelithiasis and right pleural effusion. MRCP was negative for biliary dilatation. He was started on empiric Unasyn. HIDA scan was ordered but could not be completed. A 2-D echo done after a massive pericardial effusion was noted on MRI of the  abdomen showed a large free-flowing pericardial effusion with tamponade physiology. He was presented to [place] or to [facility], underwent subxiphoid pericardial window on 10/28 and admitted to stepdown unit.   Assessment/Plan: Pericardial effusion with tamponade physiology MRI abdomen showed massive pericardial effusion and 2-D echo showing large free-flowing pericardial effusion with tamponade physiology. patient underwent subxyphoid pericardial window on 10/28. -Appreciate cardiothoracic consultation and follow-up. Inflammed fibroadipose tissue on cytology, no malignancy. -Minimal output from chest tube . Removed today. Follow-up chest x-ray without any pneumothorax. -stable to be discharged home. He will follow-up with Dr. Roxy Manns in 2 weeks.   Abdominal pain with hyperbilirubinemia and transaminitis Possibly for cholelithiasis, cholecystitis versus head was reaction from Tarceva versus metastatic disease. Currently resolved. MRCP showing no biliary dilatation. HIDA scan unable to be completed. Holding Tarceva per oncology. LFTs improving. Follow-up as outpatient. As per surgery given his significant medical comorbidity recommended no further gallbladder workup unless symptoms reoccurred.  Acute unresponsive episode on 10/28 Possibly secondary to effusions causing him acute spells. No further symptoms. No further workup ordered.  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 Resolved  Lower extremity edema Possibly associated with liver failure. Lower extremity venous Dopplers negative for DVT. Resolved with a dose of Lasix.  metastatic non-small cell lung cancer, adenocarcinoma with positive EGFR mutation Oncology following. Holding Tarceva until acute issues resolved and will have outpatient follow-up with Dr. Julien Nordmann in 1 week.  Diabetes mellitus type 2 - seems to have prediabetes earlier. Recent A1c of 8. Maintain on sliding scale insulin while in the hospital. Will discharge him  on Amaryl.  Has glucometer and other diabetic supplies at home. Follow-up as outpatient.  Hypokalemia Replenished  Bilateral leg pain involving b/l plantar area ? Fascitis. Now improved with pain medications.    Code Status: Full code Family Communication: Wife at bedside. Disposition Plan: Home without patient follow-up   Consultants:  Cardiothoracic surgery  Dr Julien Nordmann  Procedures:  Subxiphoid pericardial window  TEE  Antibiotics:  None    Discharge Exam: Filed Vitals:   12/20/14 1053  BP: 107/61  Pulse: 94  Temp: 98.6 F (37 C)  Resp: 27    General : middle aged male not in distress HEENT: No pallor, moist oral mucosa,  supple neck next   chest: Diminished breath sounds over left lung , rhonchi or wheeze CVS: Normal S1 and S2, no murmurs rub or gallop,  GI: Soft, nondistended, nontender, bowel sounds present Musculoskeletal: Warm, no edema CNS: Alert and oriented    Discharge Instructions    Current Discharge Medication List    START taking these medications   Details  feeding supplement, ENSURE ENLIVE, (ENSURE ENLIVE) LIQD Take 237 mLs by mouth 2 (two) times daily between meals. Qty: 237 mL, Refills: 12    glimepiride (AMARYL) 2 MG tablet Take 1 tablet (2 mg total) by mouth daily with breakfast. Qty: 30 tablet, Refills: 0      CONTINUE these medications which have CHANGED   Details  oxycodone 5-MG tablet Take 1 tablets by mouth every 4 (four) hours as needed for moderate pain. Qty: 30 tablet, Refills: 0      CONTINUE these medications which have NOT CHANGED   Details  hyaluronate sodium (RADIAPLEXRX) GEL Apply 1 application topically daily.      STOP taking these medications     erlotinib (TARCEVA) 150 MG tablet      ibuprofen (ADVIL,MOTRIN) 200 MG tablet      ondansetron (ZOFRAN) 8 MG tablet        Allergies  Allergen Reactions  . Hydrocodone-Acetaminophen Nausea Only and Other (See Comments)    Numbness    Follow-up Information     Follow up with Rexene Alberts, MD In 2 weeks.   Specialty:  Cardiothoracic Surgery   Why:  Office will contact you with appointment date and time, please get CXR 30 min prior to your appointment with Dr. Leota Sauers information:   [place] [address] [phone]       Follow up with Eilleen Kempf., MD. Schedule an appointment as soon as possible for a visit in 1 week.   Specialty:  Oncology   Contact information:   [address] [place] [address] [phone]        The results of significant diagnostics from this hospitalization (including imaging, microbiology, ancillary and laboratory) are listed below for reference.    Significant Diagnostic Studies: Dg Chest 2 View  12/20/2014  CLINICAL DATA:  49 year old male with history of left-sided chest tube. 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 EXAM: CHEST  2 VIEW COMPARISON:  Chest x-ray 12/19/2014. FINDINGS: Previously noted left-sided chest tube is been removed. There continues to be a drain projecting over the upper abdomen and lower mediastinum (inferior to the heart), presumably a pericardial drain. No definite pneumothorax. Known perihilar left upper lobe mass with postobstructive changes in the left upper lobe redemonstrated. Elevation of the left hemidiaphragm and small left pleural effusion. Developing patchy opacity in the right lung,  concerning for endobronchial spread of infection, most evident in the right mid lung. Heart size is partially obscured but appears normal. Upper mediastinal contours are slow partially obscured on the left side related to the known left upper lobe mass and airspace consolidation. IMPRESSION: 1. Support apparatus, as above. No definite pneumothorax following left-sided chest tube removal. 2. Left upper lobe perihilar mass with extensive postobstructive changes throughout the left upper lobe and small left  pleural effusion with elevation of left hemidiaphragm, similar to the prior examination. 3. Worsening patchy airspace opacities in the right lung, concerning for endobronchial spread of infection. 4. Pericardial drain remains in position. Electronically Signed   By: Vinnie Langton M.D.   On: 12/20/2014 07:48   Dg Chest 2 View  12/10/2014  CLINICAL DATA:  Cough and shortness of breath. Current diagnosis of lung cancer. EXAM: CHEST  2 VIEW COMPARISON:  PET-CT dated 11/03/2014 FINDINGS: Cardiomediastinal silhouette is enlarged. Mediastinal contours is obscured by a large left perihilar mass. There are bilateral pleural effusions with associated subsegmental bibasilar atelectasis. Osseous structures are without acute abnormality. Soft tissues are grossly normal. IMPRESSION: Large left perihilar mass, consistent with known primary lung malignancy. Bilateral pleural effusions, moderate in size. Enlarged cardiac silhouette. Pericardial effusion cannot be excluded. Electronically Signed   By: Fidela Salisbury M.D.   On: 12/10/2014 15:21   Dg Abd 1 View  12/02/2014  ADDENDUM REPORT: 12/02/2014 16:24 ADDENDUM: Not mentioned above is small-moderate amount of stool in the ascending colon. No evidence of rectal fecal impaction. Electronically Signed   By: Kathreen Devoid   On: 12/02/2014 16:24  12/02/2014  CLINICAL DATA:  The mid and low abdominal pain EXAM: ABDOMEN - 1 VIEW COMPARISON:  PET-CT 11/03/2014 FINDINGS: The bowel gas pattern is normal. No radio-opaque calculi or other significant radiographic abnormality are seen. IMPRESSION: Negative. Electronically Signed: By: Kathreen Devoid On: 12/02/2014 15:39   Ct Head Wo Contrast  12/11/2014  CLINICAL DATA:  Rolled out of bed and hit right upper posterior head. Initial encounter. EXAM: CT HEAD WITHOUT CONTRAST TECHNIQUE: Contiguous axial images were obtained from the base of the skull through the vertex without intravenous contrast. COMPARISON:  MRI of the brain  performed 11/19/2014 FINDINGS: There is no evidence of acute infarction, mass lesion, or intra- or extra-axial hemorrhage on CT. The posterior fossa, including the cerebellum, brainstem and fourth ventricle, is within normal limits. The third and lateral ventricles, and basal ganglia are unremarkable in appearance. The cerebral hemispheres are symmetric in appearance, with normal gray-white differentiation. No mass effect or midline shift is seen. There is no evidence of fracture; visualized osseous structures are unremarkable in appearance. The orbits are within normal limits. The paranasal sinuses and mastoid air cells are well-aerated. No significant soft tissue abnormalities are seen. IMPRESSION: No evidence of traumatic intracranial injury or fracture Electronically Signed   By: Garald Balding M.D.   On: 12/11/2014 05:23   Nm Hepatobiliary Liver Func  12/11/2014  CLINICAL DATA:  Right upper quadrant abdominal pain, nausea, vomiting. EXAM: NUCLEAR MEDICINE HEPATOBILIARY IMAGING TECHNIQUE: Sequential images of the abdomen were obtained out to 60 minutes following intravenous administration of radiopharmaceutical. RADIOPHARMACEUTICALS:  7.4 mCi Tc-68m Choletec IV COMPARISON:  MRI of December 10, 2014. FINDINGS: Normal uptake within hepatic parenchyma is noted. Imaging was only carried out to 40 minutes after isotope administration as the patient had to go to emergency heart surgery. Filling of the duodenum is noted, but no definite gallbladder filling is noted. IMPRESSION: Exam  was stopped at 40 minutes after radiotracer administration as patient had to go to surgery. No definite filling of the gallbladder is noted on the available images, but given that the exam did not go 1 hour, this exam is nondiagnostic for the evaluation for cholecystitis. Electronically Signed   By: Marijo Conception, M.D.   On: 12/11/2014 14:53   Mr Abdomen Mrcp Wo Cm  12/11/2014  CLINICAL DATA:  Abdominal pain. Left upper lobe  adenocarcinoma, metastatic. EXAM: MRI ABDOMEN WITHOUT CONTRAST  (INCLUDING MRCP) TECHNIQUE: Multiplanar multisequence MR imaging of the abdomen was performed. Heavily T2-weighted images of the biliary and pancreatic ducts were obtained, and three-dimensional MRCP images were rendered by post processing. COMPARISON:  11/03/2014 ; 12/10/2014 FINDINGS: Despite efforts by the technologist and patient, motion artifact is present on today's exam and could not be eliminated. This reduces exam sensitivity and specificity. Lower chest: Massive pericardial effusion, new compared to 11/03/14. Considerable bilateral pleural effusions. Hepatobiliary: Multiple T2 hyperintense liver lesions are present. This includes a 1.9 by 1.3 cm lesion in segment 2; a 2.8 by 1.8 cm lesion in segment 3; a 3.0 by 2.4 cm lesion posteriorly in segment 4A near the IVC ; and several other smaller right hepatic lobe lesions. Periportal edema is present. This is nonspecific. Attempts at at diagnostic MRCP images worth awarded by motion artifact. There is likely some gallbladder wall thickening and suspected gallstones. No definite biliary dilatation. Pancreas: Grossly unremarkable. Spleen: Unremarkable Adrenals/Urinary Tract: Unremarkable Stomach/Bowel: Unremarkable Vascular/Lymphatic: Unremarkable Other: Upper abdominal ascites especially tracking around the spleen and in the paracolic gutters. Musculoskeletal: Unremarkable where visualized. IMPRESSION: 1. Multiple hepatic lesions measuring up to 3 cm in long axis are T2 hyperintense. On sonography these appeared echogenic. On outside CT angiogram of the chest dated 10/13/2014 there was some arterial phase enhancement associated with some of these lesions. On the nuclear medicine PET-CT from 73/22/0254, no hypermetabolic liver lesion is observed. Accordingly, the possibility is certainly raised that these lesions represent hemangiomas rather than necessarily being due to metastatic disease.  Unfortunately IV contrast could not be administered to confirm this. As part of future staging workups when the patient is less acutely ill, contrast-enhanced hepatic protocol CT or MRI would be suggested. 2. Massive pericardial effusion, new compared to 11/03/2014. Correlation with Beck's triad or other signs of tamponade recommended. 3. Bilateral pleural effusions and ascites. 4. Thick-walled gallbladder, nonspecific, but cholecystitis is not excluded. Cholelithiasis. No definite biliary dilatation ; MRCP sequences or on feasible due to motion artifact. 5. Nonspecific periportal edema. These results will be called to the ordering clinician or representative by the Radiologist Assistant, and communication documented in the PACS or zVision Dashboard. Electronically Signed   By: Van Clines M.D.   On: 12/11/2014 07:53   Mr 3d Recon At Scanner  12/11/2014  CLINICAL DATA:  Abdominal pain. Left upper lobe adenocarcinoma, metastatic. EXAM: MRI ABDOMEN WITHOUT CONTRAST  (INCLUDING MRCP) TECHNIQUE: Multiplanar multisequence MR imaging of the abdomen was performed. Heavily T2-weighted images of the biliary and pancreatic ducts were obtained, and three-dimensional MRCP images were rendered by post processing. COMPARISON:  11/03/2014 ; 12/10/2014 FINDINGS: Despite efforts by the technologist and patient, motion artifact is present on today's exam and could not be eliminated. This reduces exam sensitivity and specificity. Lower chest: Massive pericardial effusion, new compared to 11/03/14. Considerable bilateral pleural effusions. Hepatobiliary: Multiple T2 hyperintense liver lesions are present. This includes a 1.9 by 1.3 cm lesion in segment 2; a 2.8 by 1.8 cm lesion  in segment 3; a 3.0 by 2.4 cm lesion posteriorly in segment 4A near the IVC ; and several other smaller right hepatic lobe lesions. Periportal edema is present. This is nonspecific. Attempts at at diagnostic MRCP images worth awarded by motion  artifact. There is likely some gallbladder wall thickening and suspected gallstones. No definite biliary dilatation. Pancreas: Grossly unremarkable. Spleen: Unremarkable Adrenals/Urinary Tract: Unremarkable Stomach/Bowel: Unremarkable Vascular/Lymphatic: Unremarkable Other: Upper abdominal ascites especially tracking around the spleen and in the paracolic gutters. Musculoskeletal: Unremarkable where visualized. IMPRESSION: 1. Multiple hepatic lesions measuring up to 3 cm in long axis are T2 hyperintense. On sonography these appeared echogenic. On outside CT angiogram of the chest dated 10/13/2014 there was some arterial phase enhancement associated with some of these lesions. On the nuclear medicine PET-CT from 16/11/9602, no hypermetabolic liver lesion is observed. Accordingly, the possibility is certainly raised that these lesions represent hemangiomas rather than necessarily being due to metastatic disease. Unfortunately IV contrast could not be administered to confirm this. As part of future staging workups when the patient is less acutely ill, contrast-enhanced hepatic protocol CT or MRI would be suggested. 2. Massive pericardial effusion, new compared to 11/03/2014. Correlation with Beck's triad or other signs of tamponade recommended. 3. Bilateral pleural effusions and ascites. 4. Thick-walled gallbladder, nonspecific, but cholecystitis is not excluded. Cholelithiasis. No definite biliary dilatation ; MRCP sequences or on feasible due to motion artifact. 5. Nonspecific periportal edema. These results will be called to the ordering clinician or representative by the Radiologist Assistant, and communication documented in the PACS or zVision Dashboard. Electronically Signed   By: Van Clines M.D.   On: 12/11/2014 07:53   US Abdomen Limited  12/10/2014  CLINICAL DATA:  Evaluate for cholecystitis/cause of hepatitis EXAM: US ABDOMEN LIMITED - RIGHT UPPER QUADRANT COMPARISON:  None. FINDINGS: Gallbladder:  Multiple gallstones within the partially full gallbladder. The wall is circumferentially thickened to 6 mm. No pericholecystic edema. Sonographer reports focal tenderness. Common bile duct: Diameter: 2 mm Liver: Three echogenic masses within the liver, ranging between 16 and 24 mm in maximal diameter, without hypoechoic halo. Antegrade flow in the imaged portal venous system. Right pleural effusion, small by chest radiography. IMPRESSION: 1. At least 3 liver masses, primarily concerning for lung cancer metastases. 2. Cholelithiasis with conflicting findings for acute cholecystitis. There is gallbladder wall thickening and focal tenderness, but the gallbladder is not dilated as usually seen with cystic duct obstruction and acute cholecystitis. 3. Right pleural effusion. Electronically Signed   By: Monte Fantasia M.D.   On: 12/10/2014 16:12   Dg Chest 2v Repeat Same Day  12/20/2014  CLINICAL DATA:  Status post left chest tube removal. Persistent cough. EXAM: CHEST  2 VIEW COMPARISON:  None. FINDINGS: The cardiac silhouette, mediastinal and hilar contours are stable. The left chest tube is been removed. No pneumothorax. There is persistent airspace opacity in the left lung likely a combination of tumor and interstitial spread of tumor. No pleural effusion. The bony thorax is intact. IMPRESSION: Stable airspace process of the left long likely a combination of tumor and interstitial spread of tumor. No pneumothorax. Electronically Signed   By: Marijo Sanes M.D.   On: 12/20/2014 14:54   Dg Chest Port 1 View  12/19/2014  CLINICAL DATA:  49 year old male with a history of left pneumothorax and thoracostomy tube. Known left lung mass and adenopathy, better characterized on prior PET-CT EXAM: PORTABLE CHEST 1 VIEW COMPARISON:  Multiple prior plain film, most recent 12/18/2014. FINDINGS: Cardiomediastinal  silhouette likely unchanged, with the left heart border obscured by overlying lung/ pleural disease. Dense  left-sided opacity, similar to the comparison studies. Since the prior study there has been resolution of the left-sided pneumothorax. Right lung relatively well aerated with low lung volume. No confluent airspace disease on the right. IMPRESSION: Unchanged position of right-sided thoracostomy tube, with interval resolution of right-sided pneumothorax. Dense left-sided opacity, likely a combination of known tumor, treatment effects, and airspace consolidation. Signed, Dulcy Fanny. Earleen Newport, DO [facility] Electronically Signed   By: Corrie Mckusick D.O.   On: 12/19/2014 09:02   Dg Chest Port 1 View  12/18/2014  CLINICAL DATA:  Pericardial effusion EXAM: PORTABLE CHEST 1 VIEW COMPARISON:  Radiograph 12/16/2014, 12/17/2014 FINDINGS: LEFT chest tube in place. Small LEFT apical pneumothorax measuring 13 mm the apical chest wall. Airspace disease in the LEFT upper lobe and LEFT lower lobe is unchanged. There is volume loss LEFT thorax. Cardiac silhouette is poorly defined on the LEFT and normal on the RIGHT. RIGHT lung is clear. IMPRESSION: 1. Small LEFT apical pneumothorax with chest tube in place. 2. Stable airspace disease in the LEFT lung with volume loss These results will be called to the ordering clinician or representative by the Radiologist Assistant, and communication documented in the PACS or zVision Dashboard. Electronically Signed   By: Suzy Bouchard M.D.   On: 12/18/2014 07:54   Dg Chest Port 1 View  12/17/2014  CLINICAL DATA:  Pericardial effusion. EXAM: PORTABLE CHEST 1 VIEW COMPARISON:  12/16/2014. FINDINGS: Left chest tube and pericardial drainage catheter stable position. Heart size stable. Progressive diffuse left lung infiltrate. Low lung volumes. No pleural effusion or pneumothorax. IMPRESSION: 1. Left chest tube and pericardial drainage catheter in stable position. 2.  Progressive diffuse left lung infiltrate. Electronically Signed   By:  Marcello Moores  Register   On: 12/17/2014 07:46   Dg Chest Port 1 View  12/16/2014  CLINICAL DATA:  Follow-up pleural effusion, known stage IV adenocarcinoma of the lung EXAM: PORTABLE CHEST 1 VIEW COMPARISON:  Portable chest x-ray of December 15, 2014 FINDINGS: The lungs remain mildly hypoinflated. The left-sided chest tube is unchanged with its tip projecting over the posterior medial aspect of the fourth rib. There is no definite pneumothorax. There is persistent interstitial and early alveolar airspace disease bilaterally. Slight interval improvement at the right lung base has occurred. No significant pleural effusion is observed. The cardiac silhouette is not enlarged. The pericardial drain is stable in appearance. IMPRESSION: Slight interval improvement in airspace opacities at the right lung base. Stable appearance of the left lung. The left-sided chest tube and the pericardial drain are is unchanged. Electronically Signed   By: David  Martinique M.D.   On: 12/16/2014 07:45   Dg Chest Port 1 View  12/15/2014  CLINICAL DATA:  Pleural effusion.  Lung cancer. EXAM: PORTABLE CHEST 1 VIEW COMPARISON:  12/14/2014 FINDINGS: Interval improvement in bibasilar airspace disease, left greater than right. This likely reflects interval improvement of pulmonary edema. There remains residual airspace disease in the bases left greater than right which has improved. Left chest tube remains in place. Minimal left effusion. No pneumothorax. IMPRESSION: Interval improvement in bibasilar airspace disease left greater than right. Left chest tube remains in place without pneumothorax. Minimal left effusion. Electronically Signed   By: Franchot Gallo M.D.   On: 12/15/2014 08:37   Dg Chest Port 1 View  12/14/2014  CLINICAL DATA:  Pericardial effusion. EXAM: PORTABLE CHEST 1 VIEW  COMPARISON:  12/13/2014 . FINDINGS: Left chest tube in stable position. Persistent cardiomegaly with bilateral pulmonary alveolar infiltrates consistent with  congestive heart failure and pulmonary edema. Small pleural effusions are noted. No definite pneumothorax . No acute bony abnormality . IMPRESSION: 1. Persistent cardiomegaly and bilateral pulmonary alveolar infiltrates consistent with congestive heart failure. Small bilateral pleural effusions again noted . Persistent low lung volumes. 2. Left chest tube in stable position. No definite pneumothorax noted. Electronically Signed   By: Ramos   On: 12/14/2014 07:43   Dg Chest Port 1 View  12/13/2014  CLINICAL DATA:  Pneumothorax, chest tube.  Pericardial effusion EXAM: PORTABLE CHEST 1 VIEW COMPARISON:  Radiograph 12/12/2014 FINDINGS: LEFT chest tube in place. No appreciable pneumothorax. Prominent cardiac silhouette is similar prior. LEFT upper lobe mass is difficult to define. There is bilateral pleural effusions not changed. IMPRESSION: 1. No interval change. 2. LEFT chest tube in place without pneumothorax. 3. Central venous congestion and bilateral effusions. 4. Prominent cardiac silhouette is not changed. Electronically Signed   By: Suzy Bouchard M.D.   On: 12/13/2014 08:17   Dg Chest Port 1 View  12/12/2014  CLINICAL DATA:  Pneumothorax, chest tube present. History of pericardial effusion with cardiac tamponade. Cancer. EXAM: PORTABLE CHEST 1 VIEW COMPARISON:  12/11/2014 FINDINGS: Low lung volumes. Left chest tube.  No pneumothorax is seen. Known left upper lobe mass with lymphangitic spread in the left upper lobe. Superimposed right perihilar edema and/or infection. Additional patchy opacity in the right lower lobe, atelectasis versus pneumonia. Suspected small bilateral pleural effusions. Cardiomegaly. IMPRESSION: Left chest tube.  No pneumothorax is seen. Known left upper lobe mass with lymphangitic spread in the left upper lobe. Superimposed right perihilar edema and/or infection. Additional patchy right lower lobe opacity, atelectasis versus pneumonia. Small bilateral pleural  effusions. Electronically Signed   By: Julian Hy M.D.   On: 12/12/2014 12:24   Dg Chest Port 1 View  12/11/2014  CLINICAL DATA:  Atelectasis EXAM: PORTABLE CHEST 1 VIEW COMPARISON:  12/10/2014 FINDINGS: Marked cardiopericardial enlargement. There is hazy opacity over the bilateral chest compatible with layering pleural effusions. New placement of drains over the chest post subxiphoid window. Hazy mass in the left upper chest in this patient with known adenocarcinoma. IMPRESSION: 1. Decreased cardiopericardial enlargement after pericardial effusion drainage/drain placement. 2. Lower lung volumes with bilateral layering pleural effusion. 3. Extensive left upper lobe mass. Electronically Signed   By: Monte Fantasia M.D.   On: 12/11/2014 14:23    Microbiology: Recent Results (from the past 240 hour(s))  MRSA PCR Screening     Status: None   Collection Time: 12/11/14  8:44 AM  Result Value Ref Range Status   MRSA by PCR NEGATIVE NEGATIVE Final    Comment:        The GeneXpert MRSA Assay (FDA approved for NASAL specimens only), is one component of a comprehensive MRSA colonization surveillance program. It is not intended to diagnose MRSA infection nor to guide or monitor treatment for MRSA infections.   Fungus Culture with Smear     Status: None (Preliminary result)   Collection Time: 12/11/14 12:54 PM  Result Value Ref Range Status   Specimen Description FLUID PERICARDIAL  Final   Special Requests SPEC B PT ON UNASYN  Final   Fungal Smear   Final    NO YEAST OR FUNGAL ELEMENTS SEEN Performed at [facility]    Culture   Final    CULTURE IN PROGRESS FOR FOUR  WEEKS Performed at [facility]    Report Status PENDING  Incomplete  AFB culture with smear     Status: None (Preliminary result)   Collection Time: 12/11/14 12:54 PM  Result Value Ref Range Status   Specimen Description FLUID PERICARDIAL  Final   Special Requests SPEC B PT ON UNASYN  Final   Acid  Fast Smear   Final    NO ACID FAST BACILLI SEEN Performed at [facility]    Culture   Final    CULTURE WILL BE EXAMINED FOR 6 WEEKS BEFORE ISSUING A FINAL REPORT Performed at [facility]    Report Status PENDING  Incomplete  Culture, body fluid-bottle     Status: None   Collection Time: 12/11/14 12:54 PM  Result Value Ref Range Status   Specimen Description FLUID PERICARDIAL  Final   Special Requests NONE  Final   Culture NO GROWTH 5 DAYS  Final   Report Status 12/16/2014 FINAL  Final  Gram stain     Status: None   Collection Time: 12/11/14 12:54 PM  Result Value Ref Range Status   Specimen Description FLUID PERICARDIAL  Final   Special Requests NONE  Final   Gram Stain   Final    FEW WBC PRESENT,BOTH PMN AND MONONUCLEAR NO ORGANISMS SEEN    Report Status 12/11/2014 FINAL  Final     Labs: Basic Metabolic Panel:  Recent Labs Lab 12/15/14 0330 12/16/14 0805 12/17/14 0502 12/18/14 0435 12/19/14 0336  NA 137 133* 138 137 138  K 3.8 3.8 4.1 3.4* 3.8  CL 101 98* 103 101 102  CO2 32 29 28 28 28   GLUCOSE 118* 123* 118* 138* 115*  BUN 8 7 7 7 9   CREATININE 0.56* 0.61 0.69 0.66 0.56*  CALCIUM 8.2* 8.7* 8.5* 8.2* 8.4*   Liver Function Tests:  Recent Labs Lab 12/14/14 0325 12/15/14 0330 12/19/14 1122  AST 38 36 39  ALT 141* 113* 73*  ALKPHOS 118 124 124  BILITOT 1.2 1.0 0.8  PROT 4.6* 4.4* 5.1*  ALBUMIN 2.1* 2.0* 2.2*   No results for input(s): LIPASE, AMYLASE in the last 168 hours. No results for input(s): AMMONIA in the last 168 hours. CBC:  Recent Labs Lab 12/14/14 0325 12/15/14 0330 12/17/14 0502 12/19/14 0336  WBC 9.1 6.9 9.1 8.1  HGB 10.2* 10.7* 11.3* 10.8*  HCT 32.3* 33.9* 35.2* 34.7*  MCV 87.1 86.7 87.6 87.0  PLT 366 325 286 270   Cardiac Enzymes: No results for input(s): CKTOTAL, CKMB, CKMBINDEX, TROPONINI in the last 168 hours. BNP: BNP (last 3 results) No results for input(s): BNP in the last 8760 hours.  ProBNP (last  3 results) No results for input(s): PROBNP in the last 8760 hours.  CBG:  Recent Labs Lab 12/19/14 1250 12/19/14 1718 12/19/14 2153 12/20/14 0759 12/20/14 1202  GLUCAP 148* 117* 148* 128* 206*       Signed:  Giovannina Mun  [facility] 12/20/2014, 3:02 PM

## 2014-12-20 NOTE — Discharge Instructions (Signed)
1. Patient may shower 2. Keep wound clean and dry

## 2014-12-20 NOTE — Progress Notes (Signed)
Pericardial chest tube removed per orders. Pain medication administered prior to removal per patient request. Patient tolerated procedure well. No adverse events noted. Will continue to monitor.  Follow up CXR set for this afternoon per CTS.

## 2014-12-21 ENCOUNTER — Ambulatory Visit: Admission: RE | Admit: 2014-12-21 | Payer: Medicaid Other | Source: Ambulatory Visit

## 2014-12-21 ENCOUNTER — Telehealth: Payer: Self-pay | Admitting: Internal Medicine

## 2014-12-21 ENCOUNTER — Ambulatory Visit: Payer: Medicaid Other

## 2014-12-21 NOTE — Telephone Encounter (Signed)
returned call adn s.w pt and sched appt...ok and aware of new d.t

## 2014-12-22 ENCOUNTER — Ambulatory Visit: Payer: Medicaid Other

## 2014-12-23 ENCOUNTER — Ambulatory Visit: Payer: Medicaid Other

## 2014-12-23 ENCOUNTER — Encounter: Payer: Self-pay | Admitting: *Deleted

## 2014-12-23 NOTE — Progress Notes (Signed)
[place] Psychosocial Distress Screening Clinical Social Work  Clinical Social Work was referred by distress screening protocol.  The patient scored a 10 on the Psychosocial Distress Thermometer which indicates severe distress. Clinical Social Worker contacted patient by phone to assess for distress and other psychosocial needs. The patient shared his main concern is "getting well".  He was also concerned regarding finances, specifically losing his home.  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  His spouse is currently not working to care for him.  Patient plans to meet with CSW after upcoming medical oncology visit.  ONCBCN DISTRESS SCREENING 11/25/2014  Screening Type Initial Screening  Distress experienced in past week (1-10) 10  Practical problem type Housing;Insurance;Work/school  Information Concerns Type Lack of info about diagnosis;Lack of info about treatment  Physical Problem type Pain;Nausea/vomiting;Sleep/insomnia;Constipation/diarrhea    Clinical Social Worker follow up needed: Yes.    If yes, follow up plan:  after med onc visit  Polo Riley, MSW, LCSW, OSW-C Clinical Social Worker [facility] [phone]

## 2014-12-24 ENCOUNTER — Ambulatory Visit: Payer: Medicaid Other

## 2014-12-25 ENCOUNTER — Encounter (INDEPENDENT_AMBULATORY_CARE_PROVIDER_SITE_OTHER): Payer: Self-pay

## 2014-12-25 ENCOUNTER — Ambulatory Visit: Payer: Medicaid Other

## 2014-12-25 DIAGNOSIS — C3490 Malignant neoplasm of unspecified part of unspecified bronchus or lung: Secondary | ICD-10-CM

## 2014-12-28 ENCOUNTER — Ambulatory Visit: Payer: Medicaid Other

## 2014-12-29 ENCOUNTER — Ambulatory Visit: Payer: Medicaid Other

## 2014-12-30 ENCOUNTER — Ambulatory Visit: Payer: Medicaid Other

## 2014-12-30 ENCOUNTER — Ambulatory Visit
Admission: RE | Admit: 2014-12-30 | Discharge: 2014-12-30 | Disposition: A | Discharge status: Home / Self Care | Payer: Medicaid Other | Source: Ambulatory Visit | Attending: Radiation Oncology | Admitting: Radiation Oncology

## 2014-12-31 ENCOUNTER — Ambulatory Visit: Payer: Medicaid Other

## 2014-12-31 ENCOUNTER — Other Ambulatory Visit: Payer: Self-pay | Admitting: Physician Assistant

## 2014-12-31 ENCOUNTER — Ambulatory Visit
Admission: RE | Admit: 2014-12-31 | Discharge: 2014-12-31 | Disposition: A | Discharge status: Home / Self Care | Payer: Medicaid Other | Source: Ambulatory Visit | Attending: Radiation Oncology | Admitting: Radiation Oncology

## 2014-12-31 ENCOUNTER — Encounter: Payer: Self-pay | Admitting: Physician Assistant

## 2014-12-31 ENCOUNTER — Ambulatory Visit (HOSPITAL_BASED_OUTPATIENT_CLINIC_OR_DEPARTMENT_OTHER): Payer: Self-pay | Admitting: Physician Assistant

## 2014-12-31 ENCOUNTER — Telehealth: Payer: Self-pay | Admitting: Physician Assistant

## 2014-12-31 VITALS — BP 118/71 | HR 99 | Temp 98.4°F | Resp 18 | Ht 68.0 in | Wt 167.6 lb

## 2014-12-31 DIAGNOSIS — C3412 Malignant neoplasm of upper lobe, left bronchus or lung: Secondary | ICD-10-CM | POA: Diagnosis present

## 2014-12-31 DIAGNOSIS — Z833 Family history of diabetes mellitus: Secondary | ICD-10-CM | POA: Diagnosis not present

## 2014-12-31 DIAGNOSIS — C7951 Secondary malignant neoplasm of bone: Secondary | ICD-10-CM

## 2014-12-31 DIAGNOSIS — Z510 Encounter for antineoplastic radiation therapy: Secondary | ICD-10-CM | POA: Diagnosis not present

## 2014-12-31 DIAGNOSIS — Z809 Family history of malignant neoplasm, unspecified: Secondary | ICD-10-CM | POA: Diagnosis not present

## 2014-12-31 DIAGNOSIS — C3492 Malignant neoplasm of unspecified part of left bronchus or lung: Secondary | ICD-10-CM

## 2014-12-31 NOTE — Telephone Encounter (Signed)
per pof to sch pt appt-gave pt copy of avs °

## 2014-12-31 NOTE — Progress Notes (Signed)
Hematology and Oncology Follow Up Visit  Steven Roberts [id number] 1965/09/21 49 y.o. 12/31/2014 8:16 AM   Interim History:  Steven Roberts returns to the clinic after a prolonged hospitalization from 10/27 till 11/6 due to pericardial effusion requiring pericardial window, elevated liver function test and transaminases. During this hospitalization he also was diagnosed with diabetes. Since discharge, he reports feeling overall better, with the exception of left shoulder pain, constant, combined by a dry cough. Denies fevers, chills, night sweats, vision changes, or mucositis. Denies any respiratory complaints. Denies any chest pain or palpitations. Denies lower extremity swelling. Denies nausea, heartburn or change in bowel habits. Appetite is normal. Denies any dysuria. Denies abnormal skin rashes, or neuropathy. Denies any bleeding issues such as epistaxis, hematemesis, hematuria or hematochezia. Ambulating without difficulty.    Medications:   Current outpatient prescriptions:  .  feeding supplement, ENSURE ENLIVE, (ENSURE ENLIVE) LIQD, Take 237 mLs by mouth 2 (two) times daily between meals., Disp: 237 mL, Rfl: 12 .  glimepiride (AMARYL) 2 MG tablet, Take 1 tablet (2 mg total) by mouth daily with breakfast., Disp: 30 tablet, Rfl: 0 .  hyaluronate sodium (RADIAPLEXRX) GEL, Apply 1 application topically daily., Disp: , Rfl:  .  oxyCODONE (OXY IR/ROXICODONE) 5 MG immediate release tablet, Take 1 tablet (5 mg total) by mouth every 4 (four) hours as needed for moderate pain., Disp: 30 tablet, Rfl: 0   Allergies:  Allergies  Allergen Reactions  . Hydrocodone-Acetaminophen Nausea Only and Other (See Comments)    Numbness     Past Medical History, Surgical history, Social history, and Family History were reviewed and updated.  Review of Systems: Review of Systems  Constitutional: Negative for malaise/fatigue.       He is recovering from recent, prolonged hospitalization  HENT: Negative.   Eyes:  Negative.   Respiratory: Positive for cough.        Dry cough   Cardiovascular: Negative.   Gastrointestinal: Negative.   Genitourinary: Negative.   Musculoskeletal: Negative.        He reports new onset of left shoulder pain, constant, for which he takes oxycodone 5 mg with some relief.  Skin: Negative.   Neurological: Negative for weakness.  Endo/Heme/Allergies: Negative.   Psychiatric/Behavioral: Negative.     Remaining ROS negative.  Physical Exam: There were no vitals taken for this visit.  Physical Exam  Constitutional: He is oriented to person, place, and time and well-developed, well-nourished, and in no distress.  HENT:  Head: Normocephalic and atraumatic.  Mouth/Throat: Oropharynx is clear and moist. No oropharyngeal exudate.  Eyes: Pupils are equal, round, and reactive to light. Left eye exhibits no discharge. No scleral icterus.  Neck: Neck supple. No JVD present. No thyromegaly present.  Cardiovascular: Normal rate, regular rhythm and normal heart sounds.  Exam reveals no gallop and no friction rub.   No murmur heard. Pulmonary/Chest: Effort normal and breath sounds normal. No respiratory distress. He has no wheezes. He has no rales. He exhibits no tenderness.  Abdominal: Soft. Bowel sounds are normal. He exhibits no distension and no mass. There is no tenderness. There is no rebound and no guarding.  Musculoskeletal: Normal range of motion. He exhibits no edema or tenderness.  Lymphadenopathy:    He has no cervical adenopathy.  Neurological: He is alert and oriented to person, place, and time. Gait normal.  Skin: Skin is warm and dry. No rash noted. No erythema.  Well-healed subxiphoid scar  Psychiatric: Mood, memory, affect and judgment normal.  LABS: CBC Latest Ref Rng 12/19/2014 12/17/2014 12/15/2014  WBC 4.0 - 10.5 K/uL 8.1 9.1 6.9  Hemoglobin 13.0 - 17.0 g/dL 10.8(L) 11.3(L) 10.7(L)  Hematocrit 39.0 - 52.0 % 34.7(L) 35.2(L) 33.9(L)  Platelets 150 - 400  K/uL 270 286 325   CMP Latest Ref Rng 12/19/2014 12/18/2014 12/17/2014  Glucose 65 - 99 mg/dL 115(H) 138(H) 118(H)  BUN 6 - 20 mg/dL 9 7 7   Creatinine 0.61 - 1.24 mg/dL 0.56(L) 0.66 0.69  Sodium 135 - 145 mmol/L 138 137 138  Potassium 3.5 - 5.1 mmol/L 3.8 3.4(L) 4.1  Chloride 101 - 111 mmol/L 102 101 103  CO2 22 - 32 mmol/L 28 28 28   Calcium 8.9 - 10.3 mg/dL 8.4(L) 8.2(L) 8.5(L)  Total Protein 6.5 - 8.1 g/dL 5.1(L) - -  Total Bilirubin 0.3 - 1.2 mg/dL 0.8 - -  Alkaline Phos 38 - 126 U/L 124 - -  AST 15 - 41 U/L 39 - -  ALT 17 - 63 U/L 73(H) - -    Radiological Studies: Dg Chest 2 View  12/20/2014  CLINICAL DATA:  49 year old male with history of left-sided chest tube. 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 EXAM: CHEST  2 VIEW COMPARISON:  Chest x-ray 12/19/2014. FINDINGS: Previously noted left-sided chest tube is been removed. There continues to be a drain projecting over the upper abdomen and lower mediastinum (inferior to the heart), presumably a pericardial drain. No definite pneumothorax. Known perihilar left upper lobe mass with postobstructive changes in the left upper lobe redemonstrated. Elevation of the left hemidiaphragm and small left pleural effusion. Developing patchy opacity in the right lung, concerning for endobronchial spread of infection, most evident in the right mid lung. Heart size is partially obscured but appears normal. Upper mediastinal contours are slow partially obscured on the left side related to the known left upper lobe mass and airspace consolidation. IMPRESSION: 1. Support apparatus, as above. No definite pneumothorax following left-sided chest tube removal. 2. Left upper lobe perihilar mass with extensive postobstructive changes throughout the left upper lobe and small left pleural effusion with elevation of left hemidiaphragm, similar to the prior examination. 3. Worsening patchy airspace opacities in the right  lung, concerning for endobronchial spread of infection. 4. Pericardial drain remains in position. Electronically Signed   By: Vinnie Langton M.D.   On: 12/20/2014 07:48   Dg Chest 2 View  12/10/2014  CLINICAL DATA:  Cough and shortness of breath. Current diagnosis of lung cancer. EXAM: CHEST  2 VIEW COMPARISON:  PET-CT dated 11/03/2014 FINDINGS: Cardiomediastinal silhouette is enlarged. Mediastinal contours is obscured by a large left perihilar mass. There are bilateral pleural effusions with associated subsegmental bibasilar atelectasis. Osseous structures are without acute abnormality. Soft tissues are grossly normal. IMPRESSION: Large left perihilar mass, consistent with known primary lung malignancy. Bilateral pleural effusions, moderate in size. Enlarged cardiac silhouette. Pericardial effusion cannot be excluded. Electronically Signed   By: Fidela Salisbury M.D.   On: 12/10/2014 15:21   Dg Abd 1 View  12/02/2014  ADDENDUM REPORT: 12/02/2014 16:24 ADDENDUM: Not mentioned above is small-moderate amount of stool in the ascending colon. No evidence of rectal fecal impaction. Electronically Signed   By: Kathreen Devoid   On: 12/02/2014 16:24  12/02/2014  CLINICAL DATA:  The mid and low abdominal pain EXAM: ABDOMEN - 1 VIEW COMPARISON:  PET-CT 11/03/2014 FINDINGS: The bowel gas pattern is normal. No radio-opaque calculi or other significant radiographic abnormality are seen. IMPRESSION:  Negative. Electronically Signed: By: Kathreen Devoid On: 12/02/2014 15:39   Ct Head Wo Contrast  12/11/2014  CLINICAL DATA:  Rolled out of bed and hit right upper posterior head. Initial encounter. EXAM: CT HEAD WITHOUT CONTRAST TECHNIQUE: Contiguous axial images were obtained from the base of the skull through the vertex without intravenous contrast. COMPARISON:  MRI of the brain performed 11/19/2014 FINDINGS: There is no evidence of acute infarction, mass lesion, or intra- or extra-axial hemorrhage on CT. The posterior  fossa, including the cerebellum, brainstem and fourth ventricle, is within normal limits. The third and lateral ventricles, and basal ganglia are unremarkable in appearance. The cerebral hemispheres are symmetric in appearance, with normal gray-white differentiation. No mass effect or midline shift is seen. There is no evidence of fracture; visualized osseous structures are unremarkable in appearance. The orbits are within normal limits. The paranasal sinuses and mastoid air cells are well-aerated. No significant soft tissue abnormalities are seen. IMPRESSION: No evidence of traumatic intracranial injury or fracture Electronically Signed   By: Garald Balding M.D.   On: 12/11/2014 05:23   Nm Hepatobiliary Liver Func  12/11/2014  CLINICAL DATA:  Right upper quadrant abdominal pain, nausea, vomiting. EXAM: NUCLEAR MEDICINE HEPATOBILIARY IMAGING TECHNIQUE: Sequential images of the abdomen were obtained out to 60 minutes following intravenous administration of radiopharmaceutical. RADIOPHARMACEUTICALS:  7.4 mCi Tc-41m Choletec IV COMPARISON:  MRI of December 10, 2014. FINDINGS: Normal uptake within hepatic parenchyma is noted. Imaging was only carried out to 40 minutes after isotope administration as the patient had to go to emergency heart surgery. Filling of the duodenum is noted, but no definite gallbladder filling is noted. IMPRESSION: Exam was stopped at 40 minutes after radiotracer administration as patient had to go to surgery. No definite filling of the gallbladder is noted on the available images, but given that the exam did not go 1 hour, this exam is nondiagnostic for the evaluation for cholecystitis. Electronically Signed   By: JMarijo Conception M.D.   On: 12/11/2014 14:53   Mr Abdomen Mrcp Wo Cm  12/11/2014  CLINICAL DATA:  Abdominal pain. Left upper lobe adenocarcinoma, metastatic. EXAM: MRI ABDOMEN WITHOUT CONTRAST  (INCLUDING MRCP) TECHNIQUE: Multiplanar multisequence MR imaging of the abdomen was  performed. Heavily T2-weighted images of the biliary and pancreatic ducts were obtained, and three-dimensional MRCP images were rendered by post processing. COMPARISON:  11/03/2014 ; 12/10/2014 FINDINGS: Despite efforts by the technologist and patient, motion artifact is present on today's exam and could not be eliminated. This reduces exam sensitivity and specificity. Lower chest: Massive pericardial effusion, new compared to 11/03/14. Considerable bilateral pleural effusions. Hepatobiliary: Multiple T2 hyperintense liver lesions are present. This includes a 1.9 by 1.3 cm lesion in segment 2; a 2.8 by 1.8 cm lesion in segment 3; a 3.0 by 2.4 cm lesion posteriorly in segment 4A near the IVC ; and several other smaller right hepatic lobe lesions. Periportal edema is present. This is nonspecific. Attempts at at diagnostic MRCP images worth awarded by motion artifact. There is likely some gallbladder wall thickening and suspected gallstones. No definite biliary dilatation. Pancreas: Grossly unremarkable. Spleen: Unremarkable Adrenals/Urinary Tract: Unremarkable Stomach/Bowel: Unremarkable Vascular/Lymphatic: Unremarkable Other: Upper abdominal ascites especially tracking around the spleen and in the paracolic gutters. Musculoskeletal: Unremarkable where visualized. IMPRESSION: 1. Multiple hepatic lesions measuring up to 3 cm in long axis are T2 hyperintense. On sonography these appeared echogenic. On outside CT angiogram of the chest dated 10/13/2014 there was some arterial phase enhancement associated with  some of these lesions. On the nuclear medicine PET-CT from 88/91/6945, no hypermetabolic liver lesion is observed. Accordingly, the possibility is certainly raised that these lesions represent hemangiomas rather than necessarily being due to metastatic disease. Unfortunately IV contrast could not be administered to confirm this. As part of future staging workups when the patient is less acutely ill, contrast-enhanced  hepatic protocol CT or MRI would be suggested. 2. Massive pericardial effusion, new compared to 11/03/2014. Correlation with Beck's triad or other signs of tamponade recommended. 3. Bilateral pleural effusions and ascites. 4. Thick-walled gallbladder, nonspecific, but cholecystitis is not excluded. Cholelithiasis. No definite biliary dilatation ; MRCP sequences or on feasible due to motion artifact. 5. Nonspecific periportal edema. These results will be called to the ordering clinician or representative by the Radiologist Assistant, and communication documented in the PACS or zVision Dashboard. Electronically Signed   By: Van Clines M.D.   On: 12/11/2014 07:53   Mr 3d Recon At Scanner  12/11/2014  CLINICAL DATA:  Abdominal pain. Left upper lobe adenocarcinoma, metastatic. EXAM: MRI ABDOMEN WITHOUT CONTRAST  (INCLUDING MRCP) TECHNIQUE: Multiplanar multisequence MR imaging of the abdomen was performed. Heavily T2-weighted images of the biliary and pancreatic ducts were obtained, and three-dimensional MRCP images were rendered by post processing. COMPARISON:  11/03/2014 ; 12/10/2014 FINDINGS: Despite efforts by the technologist and patient, motion artifact is present on today's exam and could not be eliminated. This reduces exam sensitivity and specificity. Lower chest: Massive pericardial effusion, new compared to 11/03/14. Considerable bilateral pleural effusions. Hepatobiliary: Multiple T2 hyperintense liver lesions are present. This includes a 1.9 by 1.3 cm lesion in segment 2; a 2.8 by 1.8 cm lesion in segment 3; a 3.0 by 2.4 cm lesion posteriorly in segment 4A near the IVC ; and several other smaller right hepatic lobe lesions. Periportal edema is present. This is nonspecific. Attempts at at diagnostic MRCP images worth awarded by motion artifact. There is likely some gallbladder wall thickening and suspected gallstones. No definite biliary dilatation. Pancreas: Grossly unremarkable. Spleen:  Unremarkable Adrenals/Urinary Tract: Unremarkable Stomach/Bowel: Unremarkable Vascular/Lymphatic: Unremarkable Other: Upper abdominal ascites especially tracking around the spleen and in the paracolic gutters. Musculoskeletal: Unremarkable where visualized. IMPRESSION: 1. Multiple hepatic lesions measuring up to 3 cm in long axis are T2 hyperintense. On sonography these appeared echogenic. On outside CT angiogram of the chest dated 10/13/2014 there was some arterial phase enhancement associated with some of these lesions. On the nuclear medicine PET-CT from 03/88/8280, no hypermetabolic liver lesion is observed. Accordingly, the possibility is certainly raised that these lesions represent hemangiomas rather than necessarily being due to metastatic disease. Unfortunately IV contrast could not be administered to confirm this. As part of future staging workups when the patient is less acutely ill, contrast-enhanced hepatic protocol CT or MRI would be suggested. 2. Massive pericardial effusion, new compared to 11/03/2014. Correlation with Beck's triad or other signs of tamponade recommended. 3. Bilateral pleural effusions and ascites. 4. Thick-walled gallbladder, nonspecific, but cholecystitis is not excluded. Cholelithiasis. No definite biliary dilatation ; MRCP sequences or on feasible due to motion artifact. 5. Nonspecific periportal edema. These results will be called to the ordering clinician or representative by the Radiologist Assistant, and communication documented in the PACS or zVision Dashboard. Electronically Signed   By: Van Clines M.D.   On: 12/11/2014 07:53   US Abdomen Limited  12/10/2014  CLINICAL DATA:  Evaluate for cholecystitis/cause of hepatitis EXAM: US ABDOMEN LIMITED - RIGHT UPPER QUADRANT COMPARISON:  None. FINDINGS: Gallbladder: Multiple  gallstones within the partially full gallbladder. The wall is circumferentially thickened to 6 mm. No pericholecystic edema. Sonographer reports  focal tenderness. Common bile duct: Diameter: 2 mm Liver: Three echogenic masses within the liver, ranging between 16 and 24 mm in maximal diameter, without hypoechoic halo. Antegrade flow in the imaged portal venous system. Right pleural effusion, small by chest radiography. IMPRESSION: 1. At least 3 liver masses, primarily concerning for lung cancer metastases. 2. Cholelithiasis with conflicting findings for acute cholecystitis. There is gallbladder wall thickening and focal tenderness, but the gallbladder is not dilated as usually seen with cystic duct obstruction and acute cholecystitis. 3. Right pleural effusion. Electronically Signed   By: Monte Fantasia M.D.   On: 12/10/2014 16:12   Dg Chest 2v Repeat Same Day  12/20/2014  CLINICAL DATA:  Status post left chest tube removal. Persistent cough. EXAM: CHEST  2 VIEW COMPARISON:  None. FINDINGS: The cardiac silhouette, mediastinal and hilar contours are stable. The left chest tube is been removed. No pneumothorax. There is persistent airspace opacity in the left lung likely a combination of tumor and interstitial spread of tumor. No pleural effusion. The bony thorax is intact. IMPRESSION: Stable airspace process of the left long likely a combination of tumor and interstitial spread of tumor. No pneumothorax. Electronically Signed   By: Marijo Sanes M.D.   On: 12/20/2014 14:54   Dg Chest Port 1 View  12/19/2014  CLINICAL DATA:  49 year old male with a history of left pneumothorax and thoracostomy tube. Known left lung mass and adenopathy, better characterized on prior PET-CT EXAM: PORTABLE CHEST 1 VIEW COMPARISON:  Multiple prior plain film, most recent 12/18/2014. FINDINGS: Cardiomediastinal silhouette likely unchanged, with the left heart border obscured by overlying lung/ pleural disease. Dense left-sided opacity, similar to the comparison studies. Since the prior study there has been resolution of the left-sided pneumothorax. Right lung relatively well  aerated with low lung volume. No confluent airspace disease on the right. IMPRESSION: Unchanged position of right-sided thoracostomy tube, with interval resolution of right-sided pneumothorax. Dense left-sided opacity, likely a combination of known tumor, treatment effects, and airspace consolidation. Signed, Dulcy Fanny. Earleen Newport, DO [facility] Electronically Signed   By: Corrie Mckusick D.O.   On: 12/19/2014 09:02   Dg Chest Port 1 View  12/18/2014  CLINICAL DATA:  Pericardial effusion EXAM: PORTABLE CHEST 1 VIEW COMPARISON:  Radiograph 12/16/2014, 12/17/2014 FINDINGS: LEFT chest tube in place. Small LEFT apical pneumothorax measuring 13 mm the apical chest wall. Airspace disease in the LEFT upper lobe and LEFT lower lobe is unchanged. There is volume loss LEFT thorax. Cardiac silhouette is poorly defined on the LEFT and normal on the RIGHT. RIGHT lung is clear. IMPRESSION: 1. Small LEFT apical pneumothorax with chest tube in place. 2. Stable airspace disease in the LEFT lung with volume loss These results will be called to the ordering clinician or representative by the Radiologist Assistant, and communication documented in the PACS or zVision Dashboard. Electronically Signed   By: Suzy Bouchard M.D.   On: 12/18/2014 07:54   Dg Chest Port 1 View  12/17/2014  CLINICAL DATA:  Pericardial effusion. EXAM: PORTABLE CHEST 1 VIEW COMPARISON:  12/16/2014. FINDINGS: Left chest tube and pericardial drainage catheter stable position. Heart size stable. Progressive diffuse left lung infiltrate. Low lung volumes. No pleural effusion or pneumothorax. IMPRESSION: 1. Left chest tube and pericardial drainage catheter in stable position. 2.  Progressive diffuse left lung infiltrate. Electronically Signed  By: Jackson   On: 12/17/2014 07:46   Dg Chest Port 1 View  12/16/2014  CLINICAL DATA:  Follow-up pleural effusion, known stage IV adenocarcinoma of the  lung EXAM: PORTABLE CHEST 1 VIEW COMPARISON:  Portable chest x-ray of December 15, 2014 FINDINGS: The lungs remain mildly hypoinflated. The left-sided chest tube is unchanged with its tip projecting over the posterior medial aspect of the fourth rib. There is no definite pneumothorax. There is persistent interstitial and early alveolar airspace disease bilaterally. Slight interval improvement at the right lung base has occurred. No significant pleural effusion is observed. The cardiac silhouette is not enlarged. The pericardial drain is stable in appearance. IMPRESSION: Slight interval improvement in airspace opacities at the right lung base. Stable appearance of the left lung. The left-sided chest tube and the pericardial drain are is unchanged. Electronically Signed   By: David  Martinique M.D.   On: 12/16/2014 07:45   Dg Chest Port 1 View  12/15/2014  CLINICAL DATA:  Pleural effusion.  Lung cancer. EXAM: PORTABLE CHEST 1 VIEW COMPARISON:  12/14/2014 FINDINGS: Interval improvement in bibasilar airspace disease, left greater than right. This likely reflects interval improvement of pulmonary edema. There remains residual airspace disease in the bases left greater than right which has improved. Left chest tube remains in place. Minimal left effusion. No pneumothorax. IMPRESSION: Interval improvement in bibasilar airspace disease left greater than right. Left chest tube remains in place without pneumothorax. Minimal left effusion. Electronically Signed   By: Franchot Gallo M.D.   On: 12/15/2014 08:37   Dg Chest Port 1 View  12/14/2014  CLINICAL DATA:  Pericardial effusion. EXAM: PORTABLE CHEST 1 VIEW COMPARISON:  12/13/2014 . FINDINGS: Left chest tube in stable position. Persistent cardiomegaly with bilateral pulmonary alveolar infiltrates consistent with congestive heart failure and pulmonary edema. Small pleural effusions are noted. No definite pneumothorax . No acute bony abnormality . IMPRESSION: 1. Persistent  cardiomegaly and bilateral pulmonary alveolar infiltrates consistent with congestive heart failure. Small bilateral pleural effusions again noted . Persistent low lung volumes. 2. Left chest tube in stable position. No definite pneumothorax noted. Electronically Signed   By: [place]   On: 12/14/2014 07:43   Dg Chest Port 1 View  12/13/2014  CLINICAL DATA:  Pneumothorax, chest tube.  Pericardial effusion EXAM: PORTABLE CHEST 1 VIEW COMPARISON:  Radiograph 12/12/2014 FINDINGS: LEFT chest tube in place. No appreciable pneumothorax. Prominent cardiac silhouette is similar prior. LEFT upper lobe mass is difficult to define. There is bilateral pleural effusions not changed. IMPRESSION: 1. No interval change. 2. LEFT chest tube in place without pneumothorax. 3. Central venous congestion and bilateral effusions. 4. Prominent cardiac silhouette is not changed. Electronically Signed   By: Suzy Bouchard M.D.   On: 12/13/2014 08:17   Dg Chest Port 1 View  12/12/2014  CLINICAL DATA:  Pneumothorax, chest tube present. History of pericardial effusion with cardiac tamponade. Cancer. EXAM: PORTABLE CHEST 1 VIEW COMPARISON:  12/11/2014 FINDINGS: Low lung volumes. Left chest tube.  No pneumothorax is seen. Known left upper lobe mass with lymphangitic spread in the left upper lobe. Superimposed right perihilar edema and/or infection. Additional patchy opacity in the right lower lobe, atelectasis versus pneumonia. Suspected small bilateral pleural effusions. Cardiomegaly. IMPRESSION: Left chest tube.  No pneumothorax is seen. Known left upper lobe mass with lymphangitic spread in the left upper lobe. Superimposed right perihilar edema and/or infection. Additional patchy right lower lobe opacity, atelectasis versus pneumonia. Small bilateral pleural effusions. Electronically Signed  By: Julian Hy M.D.   On: 12/12/2014 12:24   Dg Chest Port 1 View  12/11/2014  CLINICAL DATA:  Atelectasis EXAM: PORTABLE  CHEST 1 VIEW COMPARISON:  12/10/2014 FINDINGS: Marked cardiopericardial enlargement. There is hazy opacity over the bilateral chest compatible with layering pleural effusions. New placement of drains over the chest post subxiphoid window. Hazy mass in the left upper chest in this patient with known adenocarcinoma. IMPRESSION: 1. Decreased cardiopericardial enlargement after pericardial effusion drainage/drain placement. 2. Lower lung volumes with bilateral layering pleural effusion. 3. Extensive left upper lobe mass. Electronically Signed   By: Monte Fantasia M.D.   On: 12/11/2014 14:23    Impression and Plan: Non-small cell carcinoma of lung, stage 4 (HCC) Due to recent hospitalization for pericardial effusion requiring pericardial window, Tarceva oral therapy was placed on hold since 10/27 He is beginning to experience tumor related pain on his left shoulder and left upper chest area as well as left suprascapular area  Will resume Tarceva today. He continues his radiation treatments as directed With place a referral for reevaluation by Dr. Lisbeth Renshaw, as continuation of care. If the patient experiences rashes,He has been instructed to apply clindamycin 1% gel as needed.  If diarrhea is present, he has been instructed to take  Imodium ID after each loose bowel movement, maximum 8 a day Patient is scheduled to return in 2 weeks for labs and a follow-up visit.  The patient knows to call if he has any questions or concerns. This is a shared visit. Plan discussed with Dr. Julien Nordmann.    Rondel Jumbo, PA-C 11/17/20168:16 AM  ADDENDUM: Hematology/Oncology Attending: I had a face to face encounter with the patient today. I recommended his care plan. This is a very pleasant 49 years old Hispanic male recently diagnosed with a stage IV non-small cell lung cancer, adenocarcinoma with positive EGFR mutation and was started on treatment with Tarceva 150 mg by mouth daily and has been tolerating his treatment  well. The patient was admitted to [facility] few weeks ago with significant shortness of breath and elevation of his liver enzymes. He was found to have significant pericardial effusion and underwent pericardial window. His treatment was Tarceva has been on hold for the last few weeks secondary to his condition. He is feeling fine today except for pain on the left chest area. We will arrange for the patient to see Dr. Lisbeth Renshaw for reevaluation and consideration of palliative radiotherapy. I recommended for the patient to resume his treatment with Tarceva and we will continue to monitor his blood work closely. He will come back for follow-up visit in 2 weeks for reevaluation and management of any adverse effect of his treatment. For the skin rash, will start the patient on clindamycin lotion as needed and for diarrhea he was advised to take Imodium total maximum of 16 mg by mouth daily as needed for diarrhea.  The patient was advised to call immediately if he has any concerning symptoms in the interval. Disclaimer: This note was dictated with voice recognition software. Similar sounding words can inadvertently be transcribed and may be missed upon review. Eilleen Kempf., MD 12/31/2014

## 2015-01-01 ENCOUNTER — Ambulatory Visit
Admission: RE | Admit: 2015-01-01 | Discharge: 2015-01-01 | Disposition: A | Discharge status: Home / Self Care | Payer: MEDICAID | Source: Ambulatory Visit | Attending: Radiation Oncology | Admitting: Radiation Oncology

## 2015-01-01 ENCOUNTER — Ambulatory Visit: Payer: Medicaid Other

## 2015-01-01 ENCOUNTER — Ambulatory Visit
Admission: RE | Admit: 2015-01-01 | Discharge: 2015-01-01 | Disposition: A | Discharge status: Home / Self Care | Payer: Medicaid Other | Source: Ambulatory Visit

## 2015-01-01 ENCOUNTER — Telehealth: Payer: Self-pay | Admitting: *Deleted

## 2015-01-01 ENCOUNTER — Encounter: Payer: Self-pay | Admitting: Radiation Oncology

## 2015-01-01 ENCOUNTER — Other Ambulatory Visit: Payer: Self-pay | Admitting: Thoracic Surgery (Cardiothoracic Vascular Surgery)

## 2015-01-01 VITALS — BP 124/65 | HR 83 | Temp 97.9°F | Resp 20 | Wt 168.0 lb

## 2015-01-01 DIAGNOSIS — Z510 Encounter for antineoplastic radiation therapy: Secondary | ICD-10-CM | POA: Diagnosis not present

## 2015-01-01 DIAGNOSIS — I313 Pericardial effusion (noninflammatory): Secondary | ICD-10-CM

## 2015-01-01 DIAGNOSIS — I3139 Other pericardial effusion (noninflammatory): Secondary | ICD-10-CM

## 2015-01-01 DIAGNOSIS — C7951 Secondary malignant neoplasm of bone: Secondary | ICD-10-CM

## 2015-01-01 DIAGNOSIS — I314 Cardiac tamponade: Principal | ICD-10-CM

## 2015-01-01 NOTE — Progress Notes (Signed)
Weekly rad txs 5/15 completd, to left lung and right hip, pain 3/10 to his left shoulder/scapula area, taking tylenol and advil alternately for pain,  No nausea, has dry cough, energy level fair, appetite fair 12:48 PM BP 124/65 mmHg  Pulse 83  Temp(Src) 97.9 F (36.6 C) (Oral)  Resp 20  Wt 168 lb (76.204 kg)  SpO2 100%  Wt Readings from Last 3 Encounters:  01/01/15 168 lb (76.204 kg)  12/31/14 167 lb 9.6 oz (76.023 kg)  12/20/14 177 lb 7.5 oz (80.5 kg)

## 2015-01-01 NOTE — Progress Notes (Signed)
   Department of Radiation Oncology  Phone:  [phone] Fax:        [phone]  Weekly Treatment Note    Name: Steven Roberts Date: 01/01/2015 MRN: [id number] DOB: 26-Feb-1965   Current dose: 12.5 Gy  Current fraction: 5   MEDICATIONS: Current Outpatient Prescriptions  Medication Sig Dispense Refill  . acetaminophen (TYLENOL) 500 MG tablet Take 500 mg by mouth every 6 (six) hours as needed.    Marland Kitchen glimepiride (AMARYL) 2 MG tablet Take 1 tablet (2 mg total) by mouth daily with breakfast. 30 tablet 0  . hyaluronate sodium (RADIAPLEXRX) GEL Apply 1 application topically daily.    Marland Kitchen ibuprofen (ADVIL) 200 MG tablet Take 400 mg by mouth every 6 (six) hours as needed.    . clindamycin (CLINDAGEL) 1 % gel Apply topically 2 (two) times daily.    . feeding supplement, ENSURE ENLIVE, (ENSURE ENLIVE) LIQD Take 237 mLs by mouth 2 (two) times daily between meals. (Patient not taking: Reported on 01/01/2015) 237 mL 12   No current facility-administered medications for this encounter.    ALLERGIES: Hydrocodone-acetaminophen  LABORATORY DATA:  Lab Results  Component Value Date   WBC 8.1 12/19/2014   HGB 10.8* 12/19/2014   HCT 34.7* 12/19/2014   MCV 87.0 12/19/2014   PLT 270 12/19/2014   Lab Results  Component Value Date   NA 138 12/19/2014   K 3.8 12/19/2014   CL 102 12/19/2014   CO2 28 12/19/2014   Lab Results  Component Value Date   ALT 73* 12/19/2014   AST 39 12/19/2014   ALKPHOS 124 12/19/2014   BILITOT 0.8 12/19/2014    NARRATIVE: Steven Roberts was seen today for weekly treatment management. The chart was checked and the patient's films were reviewed.  Weekly rad txs 5/15 completd, to left lung and right hip, pain 3/10 to his left shoulder/scapula area, taking tylenol and advil alternately for pain, No nausea, has dry cough, energy level fair, appetite fair 12:48 PM BP 124/65 mmHg  Pulse 83  Temp(Src) 97.9 F (36.6 C) (Oral)  Resp 20  Wt 168 lb (76.204 kg)  SpO2  100%  Wt Readings from Last 3 Encounters:  01/01/15 168 lb (76.204 kg)  12/31/14 167 lb 9.6 oz (76.023 kg)  12/20/14 177 lb 7.5 oz (80.5 kg)          PHYSICAL EXAMINATION: weight is 168 lb (76.204 kg). His oral temperature is 97.9 F (36.6 C). His blood pressure is 124/65 and his pulse is 83. His respiration is 20 and oxygen saturation is 100%.        ASSESSMENT:  The patient states he is doing much better since he was released from the hospital. His pain in the left upper chest has returned however.  PLAN: The patient will continue with radiation treatment.    ------------------------------------------------  Jodelle Gross, MD, PhD  This document serves as a record of services personally performed by Kyung Rudd, MD. It was created on his behalf by  Lendon Collar, a trained medical scribe. The creation of this record is based on the scribe's personal observations and the provider's statements to them. This document has been checked and approved by the attending provider.

## 2015-01-03 ENCOUNTER — Ambulatory Visit
Admission: RE | Admit: 2015-01-03 | Discharge: 2015-01-03 | Disposition: A | Discharge status: Home / Self Care | Payer: Medicaid Other | Source: Ambulatory Visit | Attending: Radiation Oncology | Admitting: Radiation Oncology

## 2015-01-03 DIAGNOSIS — Z510 Encounter for antineoplastic radiation therapy: Secondary | ICD-10-CM | POA: Diagnosis not present

## 2015-01-04 ENCOUNTER — Ambulatory Visit
Admission: RE | Admit: 2015-01-04 | Discharge: 2015-01-04 | Disposition: A | Discharge status: Home / Self Care | Payer: No Typology Code available for payment source | Source: Ambulatory Visit | Attending: Thoracic Surgery (Cardiothoracic Vascular Surgery) | Admitting: Thoracic Surgery (Cardiothoracic Vascular Surgery)

## 2015-01-04 ENCOUNTER — Ambulatory Visit: Payer: Medicaid Other

## 2015-01-04 ENCOUNTER — Ambulatory Visit (INDEPENDENT_AMBULATORY_CARE_PROVIDER_SITE_OTHER): Payer: Self-pay | Admitting: Surgical

## 2015-01-04 ENCOUNTER — Ambulatory Visit
Admission: RE | Admit: 2015-01-04 | Discharge: 2015-01-04 | Disposition: A | Discharge status: Home / Self Care | Payer: Medicaid Other | Source: Ambulatory Visit | Attending: Radiation Oncology | Admitting: Radiation Oncology

## 2015-01-04 VITALS — BP 110/68 | HR 85 | Resp 16 | Ht 68.0 in | Wt 167.0 lb

## 2015-01-04 DIAGNOSIS — I319 Disease of pericardium, unspecified: Secondary | ICD-10-CM

## 2015-01-04 DIAGNOSIS — I3139 Other pericardial effusion (noninflammatory): Secondary | ICD-10-CM

## 2015-01-04 DIAGNOSIS — I313 Pericardial effusion (noninflammatory): Secondary | ICD-10-CM

## 2015-01-04 DIAGNOSIS — Z09 Encounter for follow-up examination after completed treatment for conditions other than malignant neoplasm: Secondary | ICD-10-CM

## 2015-01-04 DIAGNOSIS — Z510 Encounter for antineoplastic radiation therapy: Secondary | ICD-10-CM | POA: Diagnosis not present

## 2015-01-04 DIAGNOSIS — I314 Cardiac tamponade: Principal | ICD-10-CM

## 2015-01-04 NOTE — Progress Notes (Signed)
[address]       [place] [address]             [phone]                  Riot Martus [place] Medical Record #[id number] Date of Birth: [birth year]  Referring XB:JYNW, Carmin Muskrat, MD Primary Cardiology: Primary Care:Pcp Not In System  Chief Complaint:  Follow Up Visit [place] NOTE  Date of Procedure:12/11/2014  Preoperative Diagnosis:Pericardial Effusion with Cardiac Tamponade  Postoperative Diagnosis:same  Procedure:Subxyphoid Pericardial Window  Surgeon:Clarence H. Roxy Manns, MD  Assistant:Donielle Tacy Dura, PA-C  Anesthesia:John Lissa Hoard, MD  Operative Findings:  Large hemorrhagic pericardial effusion, total volume approx 1500 mL  Patient is not a candidate for pharmacologic anticoagulation at this time   Non-small cell carcinoma of lung, stage 4 ([place])   Staging form: Lung, AJCC 7th Edition     Clinical stage from 11/12/2014: Stage IV (T1b, N3, M1b) - Signed by Curt Bears, MD on 11/12/2014  History of Present Illness:    Patient is a 49 year old Hispanic male status post the above prescribed procedure. He has stage IV lung cancer and is currently undergoing radiation as well as Tarceva chemotherapy. On October 28 he underwent the drainage of a large pericardial effusion and has shown good improvement since. He denies shortness of breath. He has some minor itching associated with his incision however no pain. Overall he is feeling fairly well.         Zubrod Score: At the time of surgery this patient's most appropriate activity status/level should be described as: '[]'$     0    Normal activity, no symptoms '[]'$     1    Restricted in physical strenuous activity but ambulatory, able to do  out light work '[]'$     2    Ambulatory and capable of self care, unable to do work activities, up and about                 >50 % of waking hours                                                                                   '[]'$     3    Only limited self care, in bed greater than 50% of waking hours '[]'$     4    Completely disabled, no self care, confined to bed or chair '[]'$     5    Moribund  History  Smoking status  . Never Smoker   Smokeless tobacco  . Never Used       Allergies  Allergen Reactions  . Hydrocodone-Acetaminophen Nausea Only and Other (See Comments)    Numbness     Current Outpatient Prescriptions  Medication Sig Dispense Refill  . acetaminophen (TYLENOL) 500 MG tablet Take 500 mg by mouth every 6 (six) hours as needed.    . clindamycin (CLINDAGEL) 1 % gel Apply topically 2 (two) times daily.    Marland Kitchen glimepiride (AMARYL) 2 MG tablet Take 1 tablet (2 mg total) by mouth daily with breakfast. 30 tablet 0  . hyaluronate sodium (RADIAPLEXRX) GEL Apply  1 application topically daily.    Marland Kitchen ibuprofen (ADVIL) 200 MG tablet Take 400 mg by mouth every 6 (six) hours as needed.    . feeding supplement, ENSURE ENLIVE, (ENSURE ENLIVE) LIQD Take 237 mLs by mouth 2 (two) times daily between meals. (Patient not taking: Reported on 01/01/2015) 237 mL 12   No current facility-administered medications for this visit.      review of microscopic or straining  Physical Exam: BP 110/68 mmHg  Pulse 85  Resp 16  Ht '5\' 8"'$  (1.727 m)  Wt 167 lb (75.751 kg)  BMI 25.40 kg/m2  SpO2 98%  General appearance: alert, cooperative and no distress Heart: regular rate and rhythm and no rub Lungs: clear to auscultation bilaterally Abdomen: benign Extremities: No edema Wound: Incisions healing well without evidence of infection   Diagnostic Studies & Laboratory data:         Recent Radiology Findings: Dg Chest 2 View  01/04/2015  CLINICAL DATA:  Lung cancer EXAM: CHEST  2 VIEW COMPARISON:   12/20/2014 FINDINGS: Left upper lobe airspace disease has improved. There is persistent airspace disease centrally. Right lung is clear. No pleural effusion and no pneumothorax. Normal heart size. IMPRESSION: Improving left upper lobe pneumonia. Electronically Signed   By: Marybelle Killings M.D.   On: 01/04/2015 13:56      I have independently reviewed the above radiology findings and reviewed findings  with the patient.  Recent Labs: Lab Results  Component Value Date   WBC 8.1 12/19/2014   HGB 10.8* 12/19/2014   HCT 34.7* 12/19/2014   PLT 270 12/19/2014   GLUCOSE 115* 12/19/2014   ALT 73* 12/19/2014   AST 39 12/19/2014   NA 138 12/19/2014   K 3.8 12/19/2014   CL 102 12/19/2014   CREATININE 0.56* 12/19/2014   BUN 9 12/19/2014   CO2 28 12/19/2014   INR 1.46 12/10/2014   HGBA1C 8.0* 12/10/2014      Assessment / Plan:      Patient is doing well. He will continue his primary management per medical and radiation oncology. There are no specific current surgical issues requiring follow-up. We will be happy to see at any time.   Hessie Varone E 01/04/2015 2:36 PM

## 2015-01-05 ENCOUNTER — Ambulatory Visit: Payer: Medicaid Other

## 2015-01-05 ENCOUNTER — Ambulatory Visit
Admission: RE | Admit: 2015-01-05 | Discharge: 2015-01-05 | Disposition: A | Discharge status: Home / Self Care | Payer: Medicaid Other | Source: Ambulatory Visit | Attending: Radiation Oncology | Admitting: Radiation Oncology

## 2015-01-05 ENCOUNTER — Encounter: Payer: Self-pay | Admitting: Radiation Oncology

## 2015-01-05 VITALS — BP 118/71 | HR 92 | Temp 98.1°F | Resp 20 | Wt 170.2 lb

## 2015-01-05 DIAGNOSIS — Z510 Encounter for antineoplastic radiation therapy: Secondary | ICD-10-CM | POA: Diagnosis not present

## 2015-01-05 DIAGNOSIS — C3412 Malignant neoplasm of upper lobe, left bronchus or lung: Secondary | ICD-10-CM

## 2015-01-05 DIAGNOSIS — C7951 Secondary malignant neoplasm of bone: Secondary | ICD-10-CM

## 2015-01-05 MED ORDER — BENZONATATE 200 MG PO CAPS: 200.0000 mg | ORAL_CAPSULE | Freq: Three times a day (TID) | ORAL | Status: DC | PRN

## 2015-01-05 NOTE — Progress Notes (Signed)
Weekly rad txs left lung/rib, right hip only c/o left shoulder still hurts, takes tylenol or ibuprofen prn, and robitussin for dry cough, says not helping much for his cough, but over all says he's a lot better, appetite bettre, no difficuklty swallowing stated  3:36 PM  BP 118/71 mmHg  Pulse 92  Temp(Src) 98.1 F (36.7 C) (Oral)  Resp 20  Wt 170 lb 3.2 oz (77.202 kg)  Wt Readings from Last 3 Encounters:  01/05/15 170 lb 3.2 oz (77.202 kg)  01/04/15 167 lb (75.751 kg)  01/01/15 168 lb (76.204 kg)

## 2015-01-05 NOTE — Progress Notes (Signed)
   Department of Radiation Oncology  Phone:  [phone] Fax:        [phone]  Weekly Treatment Note    Name: Steven Roberts Date: 01/05/2015 MRN: [id number] DOB: 05/17/1965   Current dose: 20 Gy  Current fraction: 8   MEDICATIONS: Current Outpatient Prescriptions  Medication Sig Dispense Refill  . acetaminophen (TYLENOL) 500 MG tablet Take 500 mg by mouth every 6 (six) hours as needed.    . clindamycin (CLINDAGEL) 1 % gel Apply topically 2 (two) times daily.    Marland Kitchen glimepiride (AMARYL) 2 MG tablet Take 1 tablet (2 mg total) by mouth daily with breakfast. 30 tablet 0  . guaifenesin (ROBITUSSIN CHEST CONGESTION) 100 MG/5ML syrup Take 200 mg by mouth 3 (three) times daily as needed for cough.    . hyaluronate sodium (RADIAPLEXRX) GEL Apply 1 application topically daily.    Marland Kitchen ibuprofen (ADVIL) 200 MG tablet Take 400 mg by mouth every 6 (six) hours as needed.    . benzonatate (TESSALON) 200 MG capsule Take 1 capsule (200 mg total) by mouth 3 (three) times daily as needed for cough. 40 capsule 1   No current facility-administered medications for this encounter.     ALLERGIES: Hydrocodone-acetaminophen   LABORATORY DATA:  Lab Results  Component Value Date   WBC 8.1 12/19/2014   HGB 10.8* 12/19/2014   HCT 34.7* 12/19/2014   MCV 87.0 12/19/2014   PLT 270 12/19/2014   Lab Results  Component Value Date   NA 138 12/19/2014   K 3.8 12/19/2014   CL 102 12/19/2014   CO2 28 12/19/2014   Lab Results  Component Value Date   ALT 73* 12/19/2014   AST 39 12/19/2014   ALKPHOS 124 12/19/2014   BILITOT 0.8 12/19/2014     NARRATIVE: Steven Roberts was seen today for weekly treatment management. The chart was checked and the patient's films were reviewed.  Weekly rad txs left lung/rib, right hip only c/o left shoulder still hurts, takes tylenol or ibuprofen prn, and robitussin for dry cough, says not helping much for his cough, but over all says he's a lot better, appetite  bettre, no difficuklty swallowing stated  5:54 PM  BP 118/71 mmHg  Pulse 92  Temp(Src) 98.1 F (36.7 C) (Oral)  Resp 20  Wt 170 lb 3.2 oz (77.202 kg)  Wt Readings from Last 3 Encounters:  01/05/15 170 lb 3.2 oz (77.202 kg)  01/04/15 167 lb (75.751 kg)  01/01/15 168 lb (76.204 kg)    PHYSICAL EXAMINATION: weight is 170 lb 3.2 oz (77.202 kg). His oral temperature is 98.1 F (36.7 C). His blood pressure is 118/71 and his pulse is 92. His respiration is 20.        ASSESSMENT: The patient is doing satisfactorily with treatment.  PLAN: We will continue with the patient's radiation treatment as planned. Due to the patient's changes, the patient underwent another simulation today for his treatment to the lung. The lung has opened up with a significant change in anatomy.

## 2015-01-06 ENCOUNTER — Ambulatory Visit
Admission: RE | Admit: 2015-01-06 | Discharge: 2015-01-06 | Disposition: A | Discharge status: Home / Self Care | Payer: Medicaid Other | Source: Ambulatory Visit | Attending: Radiation Oncology | Admitting: Radiation Oncology

## 2015-01-06 ENCOUNTER — Ambulatory Visit: Payer: Medicaid Other

## 2015-01-06 DIAGNOSIS — Z510 Encounter for antineoplastic radiation therapy: Secondary | ICD-10-CM | POA: Diagnosis not present

## 2015-01-06 NOTE — Telephone Encounter (Signed)
error 

## 2015-01-07 ENCOUNTER — Ambulatory Visit: Payer: Medicaid Other

## 2015-01-08 LAB — FUNGUS CULTURE W SMEAR: FUNGAL SMEAR: NONE SEEN

## 2015-01-11 ENCOUNTER — Ambulatory Visit
Admission: RE | Admit: 2015-01-11 | Discharge: 2015-01-11 | Disposition: A | Discharge status: Home / Self Care | Payer: Medicaid Other | Source: Ambulatory Visit | Attending: Radiation Oncology | Admitting: Radiation Oncology

## 2015-01-11 DIAGNOSIS — Z510 Encounter for antineoplastic radiation therapy: Secondary | ICD-10-CM | POA: Diagnosis not present

## 2015-01-12 ENCOUNTER — Ambulatory Visit
Admission: RE | Admit: 2015-01-12 | Discharge: 2015-01-12 | Disposition: A | Discharge status: Home / Self Care | Payer: Medicaid Other | Source: Ambulatory Visit | Attending: Radiation Oncology | Admitting: Radiation Oncology

## 2015-01-12 DIAGNOSIS — Z510 Encounter for antineoplastic radiation therapy: Secondary | ICD-10-CM | POA: Diagnosis not present

## 2015-01-13 ENCOUNTER — Ambulatory Visit: Payer: Medicaid Other

## 2015-01-13 ENCOUNTER — Ambulatory Visit
Admission: RE | Admit: 2015-01-13 | Discharge: 2015-01-13 | Disposition: A | Discharge status: Home / Self Care | Payer: Medicaid Other | Source: Ambulatory Visit | Attending: Radiation Oncology | Admitting: Radiation Oncology

## 2015-01-13 DIAGNOSIS — Z510 Encounter for antineoplastic radiation therapy: Secondary | ICD-10-CM | POA: Diagnosis not present

## 2015-01-14 ENCOUNTER — Telehealth: Payer: Self-pay | Admitting: Internal Medicine

## 2015-01-14 ENCOUNTER — Other Ambulatory Visit (HOSPITAL_BASED_OUTPATIENT_CLINIC_OR_DEPARTMENT_OTHER): Payer: Self-pay

## 2015-01-14 ENCOUNTER — Ambulatory Visit: Payer: Medicaid Other

## 2015-01-14 ENCOUNTER — Ambulatory Visit (HOSPITAL_BASED_OUTPATIENT_CLINIC_OR_DEPARTMENT_OTHER): Payer: Self-pay | Admitting: Physician Assistant

## 2015-01-14 ENCOUNTER — Ambulatory Visit
Admission: RE | Admit: 2015-01-14 | Discharge: 2015-01-14 | Disposition: A | Discharge status: Home / Self Care | Payer: Medicaid Other | Source: Ambulatory Visit | Attending: Radiation Oncology | Admitting: Radiation Oncology

## 2015-01-14 VITALS — BP 119/66 | HR 80 | Temp 98.1°F | Resp 18 | Ht 68.0 in | Wt 171.2 lb

## 2015-01-14 DIAGNOSIS — G893 Neoplasm related pain (acute) (chronic): Secondary | ICD-10-CM

## 2015-01-14 DIAGNOSIS — C3492 Malignant neoplasm of unspecified part of left bronchus or lung: Secondary | ICD-10-CM

## 2015-01-14 DIAGNOSIS — Z510 Encounter for antineoplastic radiation therapy: Secondary | ICD-10-CM | POA: Diagnosis not present

## 2015-01-14 DIAGNOSIS — C7951 Secondary malignant neoplasm of bone: Secondary | ICD-10-CM

## 2015-01-14 DIAGNOSIS — R21 Rash and other nonspecific skin eruption: Secondary | ICD-10-CM

## 2015-01-14 LAB — CBC WITH DIFFERENTIAL/PLATELET
BASO%: 0.7 % (ref 0.0–2.0)
Basophils Absolute: 0 10*3/uL (ref 0.0–0.1)
EOS%: 3.5 % (ref 0.0–7.0)
Eosinophils Absolute: 0.2 10*3/uL (ref 0.0–0.5)
HCT: 41.3 % (ref 38.4–49.9)
HGB: 13.5 g/dL (ref 13.0–17.1)
LYMPH#: 0.6 10*3/uL — AB (ref 0.9–3.3)
LYMPH%: 9.4 % — ABNORMAL LOW (ref 14.0–49.0)
MCH: 27.9 pg (ref 27.2–33.4)
MCHC: 32.6 g/dL (ref 32.0–36.0)
MCV: 85.5 fL (ref 79.3–98.0)
MONO#: 0.4 10*3/uL (ref 0.1–0.9)
MONO%: 6.8 % (ref 0.0–14.0)
NEUT#: 4.9 10*3/uL (ref 1.5–6.5)
NEUT%: 79.6 % — AB (ref 39.0–75.0)
Platelets: 253 10*3/uL (ref 140–400)
RBC: 4.83 10*6/uL (ref 4.20–5.82)
RDW: 16.2 % — AB (ref 11.0–14.6)
WBC: 6.1 10*3/uL (ref 4.0–10.3)

## 2015-01-14 LAB — COMPREHENSIVE METABOLIC PANEL (CC13)
ALT: 28 U/L (ref 0–55)
AST: 18 U/L (ref 5–34)
Albumin: 3.3 g/dL — ABNORMAL LOW (ref 3.5–5.0)
Alkaline Phosphatase: 89 U/L (ref 40–150)
Anion Gap: 7 mEq/L (ref 3–11)
BUN: 10.8 mg/dL (ref 7.0–26.0)
CALCIUM: 9.5 mg/dL (ref 8.4–10.4)
CHLORIDE: 105 meq/L (ref 98–109)
CO2: 29 meq/L (ref 22–29)
CREATININE: 0.8 mg/dL (ref 0.7–1.3)
EGFR: >90 mL/min/{1.73_m2} (ref >90)
GLUCOSE: 223 mg/dL — AB (ref 70–140)
Potassium: 4.3 mEq/L (ref 3.5–5.1)
SODIUM: 140 meq/L (ref 136–145)
Total Bilirubin: 0.71 mg/dL (ref 0.20–1.20)
Total Protein: 7 g/dL (ref 6.4–8.3)

## 2015-01-14 NOTE — Telephone Encounter (Signed)
Gave and printed appt sched and avs for pt for Jan 2017 °

## 2015-01-14 NOTE — Progress Notes (Signed)
Hematology and Oncology Follow Up Visit  Steven Roberts [id number] 26-Jun-1965 49 y.o. 01/14/2015 11:47 AM   Interim History:  Steven Roberts returns to the clinic.He reports feeling well overall. His left shoulder pain combined by a dry cough are much improved.  Denies fevers, chills, night sweats or mucositis. Denies any respiratory complaints. Denies any chest pain or palpitations. Denies lower extremity swelling. Denies nausea, heartburn or change in bowel habits. Appetite is normal. Denies any dysuria. Denies abnormal skin rashes, or neuropathy. Denies any bleeding issues such as epistaxis, hematemesis, hematuria or hematochezia. He reports some irritation in his left eye, but denies discharge. Ambulating without difficulty. He wants to return to work    Medications:   Current outpatient prescriptions:  .  acetaminophen (TYLENOL) 500 MG tablet, Take 500 mg by mouth every 6 (six) hours as needed., Disp: , Rfl:  .  benzonatate (TESSALON) 200 MG capsule, Take 1 capsule (200 mg total) by mouth 3 (three) times daily as needed for cough., Disp: 40 capsule, Rfl: 1 .  clindamycin (CLINDAGEL) 1 % gel, Apply topically 2 (two) times daily., Disp: , Rfl:  .  glimepiride (AMARYL) 2 MG tablet, Take 1 tablet (2 mg total) by mouth daily with breakfast., Disp: 30 tablet, Rfl: 0 .  guaifenesin (ROBITUSSIN CHEST CONGESTION) 100 MG/5ML syrup, Take 200 mg by mouth 3 (three) times daily as needed for cough., Disp: , Rfl:  .  hyaluronate sodium (RADIAPLEXRX) GEL, Apply 1 application topically daily., Disp: , Rfl:  .  ibuprofen (ADVIL) 200 MG tablet, Take 400 mg by mouth every 6 (six) hours as needed., Disp: , Rfl:    Allergies:  Allergies  Allergen Reactions  . Hydrocodone-Acetaminophen Nausea Only and Other (See Comments)    Numbness     Past Medical History, Surgical history, Social history, and Family History were reviewed and updated.  Review of Systems: Review of Systems  Constitutional: Negative for  malaise/fatigue.  HENT: Negative.   Eyes: Positive for redness.  Respiratory: Positive for cough.        Dry cough   Cardiovascular: Negative.   Gastrointestinal: Negative.   Genitourinary: Negative.   Musculoskeletal: Negative.        Left shoulder pain is much improved.  Skin: Negative.   Neurological: Negative for weakness.  Endo/Heme/Allergies: Negative.   Psychiatric/Behavioral: Negative.     Remaining ROS negative.  Physical Exam: Blood pressure 119/66, pulse 80, temperature 98.1 F (36.7 C), temperature source Oral, resp. rate 18, height _0  (1.727 m), weight 171 lb 3.2 oz (77.656 kg), SpO2 100 %.  Physical Exam  Constitutional: He is oriented to person, place, and time and well-developed, well-nourished, and in no distress.  HENT:  Head: Normocephalic and atraumatic.  Mouth/Throat: Oropharynx is clear and moist. No oropharyngeal exudate.  Eyes: Pupils are equal, round, and reactive to light. Right eye exhibits no discharge.  Left conjunctivae area is red without residual discharge  Neck: Neck supple. No JVD present. No thyromegaly present.  Cardiovascular: Normal rate, regular rhythm and normal heart sounds.  Exam reveals no gallop and no friction rub.   No murmur heard. Pulmonary/Chest: Effort normal and breath sounds normal. No respiratory distress. He has no wheezes. He has no rales. He exhibits no tenderness.  Abdominal: Soft. Bowel sounds are normal. He exhibits no distension and no mass. There is no tenderness. There is no rebound and no guarding.  Musculoskeletal: Normal range of motion. He exhibits no edema or tenderness.  Lymphadenopathy:    He  has no cervical adenopathy.  Neurological: He is alert and oriented to person, place, and time. Gait normal.  Skin: Skin is warm and dry. No rash noted. No erythema.  Well-healed subxiphoid scar  Psychiatric: Mood, memory, affect and judgment normal.    LABS: CBC Latest Ref Rng 01/14/2015 12/19/2014 12/17/2014  WBC  4.0 - 10.3 10e3/uL 6.1 8.1 9.1  Hemoglobin 13.0 - 17.1 g/dL 13.5 10.8(L) 11.3(L)  Hematocrit 38.4 - 49.9 % 41.3 34.7(L) 35.2(L)  Platelets 140 - 400 10e3/uL 253 270 286   CMP Latest Ref Rng 01/14/2015 12/19/2014 12/18/2014  Glucose 70 - 140 mg/dl 223(H) 115(H) 138(H)  BUN 7.0 - 26.0 mg/dL 10._0 Creatinine 0.7 - 1.3 mg/dL 0.8 0.56(L) 0.66  Sodium 136 - 145 mEq/L 140 138 137  Potassium 3.5 - 5.1 mEq/L 4.3 3.8 3.4(L)  Chloride 101 - 111 mmol/L - 102 101  CO2 22 - 29 mEq/L _1 Calcium 8.4 - 10.4 mg/dL 9.5 8.4(L) 8.2(L)  Total Protein 6.4 - 8.3 g/dL 7.0 5.1(L) -  Total Bilirubin 0.20 - 1.20 mg/dL 0.71 0.8 -  Alkaline Phos 40 - 150 U/L 89 124 -  AST 5 - 34 U/L 18 39 -  ALT 0 - 55 U/L 28 73(H) -    Radiological Studies: Dg Chest 2 View  01/04/2015  CLINICAL DATA:  Lung cancer EXAM: CHEST  2 VIEW COMPARISON:  12/20/2014 FINDINGS: Left upper lobe airspace disease has improved. There is persistent airspace disease centrally. Right lung is clear. No pleural effusion and no pneumothorax. Normal heart size. IMPRESSION: Improving left upper lobe pneumonia. Electronically Signed   By: Marybelle Killings M.D.   On: 01/04/2015 13:56   Dg Chest 2 View  12/20/2014  CLINICAL DATA:  49 year old male with history of left-sided chest tube. 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 EXAM: CHEST  2 VIEW COMPARISON:  Chest x-ray 12/19/2014. FINDINGS: Previously noted left-sided chest tube is been removed. There continues to be a drain projecting over the upper abdomen and lower mediastinum (inferior to the heart), presumably a pericardial drain. No definite pneumothorax. Known perihilar left upper lobe mass with postobstructive changes in the left upper lobe redemonstrated. Elevation of the left hemidiaphragm and small left pleural effusion. Developing patchy opacity in the right lung, concerning for endobronchial spread of infection, most evident in the right mid  lung. Heart size is partially obscured but appears normal. Upper mediastinal contours are slow partially obscured on the left side related to the known left upper lobe mass and airspace consolidation. IMPRESSION: 1. Support apparatus, as above. No definite pneumothorax following left-sided chest tube removal. 2. Left upper lobe perihilar mass with extensive postobstructive changes throughout the left upper lobe and small left pleural effusion with elevation of left hemidiaphragm, similar to the prior examination. 3. Worsening patchy airspace opacities in the right lung, concerning for endobronchial spread of infection. 4. Pericardial drain remains in position. Electronically Signed   By: Vinnie Langton M.D.   On: 12/20/2014 07:48   Dg Chest 2v Repeat Same Day  12/20/2014  CLINICAL DATA:  Status post left chest tube removal. Persistent cough. EXAM: CHEST  2 VIEW COMPARISON:  None. FINDINGS: The cardiac silhouette, mediastinal and hilar contours are stable. The left chest tube is been removed. No pneumothorax. There is persistent airspace opacity in the left lung likely a combination of tumor and interstitial spread of tumor. No pleural effusion. The bony thorax is intact. IMPRESSION:  Stable airspace process of the left long likely a combination of tumor and interstitial spread of tumor. No pneumothorax. Electronically Signed   By: Marijo Sanes M.D.   On: 12/20/2014 14:54   Dg Chest Port 1 View  12/19/2014  CLINICAL DATA:  49 year old male with a history of left pneumothorax and thoracostomy tube. Known left lung mass and adenopathy, better characterized on prior PET-CT EXAM: PORTABLE CHEST 1 VIEW COMPARISON:  Multiple prior plain film, most recent 12/18/2014. FINDINGS: Cardiomediastinal silhouette likely unchanged, with the left heart border obscured by overlying lung/ pleural disease. Dense left-sided opacity, similar to the comparison studies. Since the prior study there has been resolution of the  left-sided pneumothorax. Right lung relatively well aerated with low lung volume. No confluent airspace disease on the right. IMPRESSION: Unchanged position of right-sided thoracostomy tube, with interval resolution of right-sided pneumothorax. Dense left-sided opacity, likely a combination of known tumor, treatment effects, and airspace consolidation. Signed, Dulcy Fanny. Earleen Newport, DO [facility] Electronically Signed   By: Corrie Mckusick D.O.   On: 12/19/2014 09:02   Dg Chest Port 1 View  12/18/2014  CLINICAL DATA:  Pericardial effusion EXAM: PORTABLE CHEST 1 VIEW COMPARISON:  Radiograph 12/16/2014, 12/17/2014 FINDINGS: LEFT chest tube in place. Small LEFT apical pneumothorax measuring 13 mm the apical chest wall. Airspace disease in the LEFT upper lobe and LEFT lower lobe is unchanged. There is volume loss LEFT thorax. Cardiac silhouette is poorly defined on the LEFT and normal on the RIGHT. RIGHT lung is clear. IMPRESSION: 1. Small LEFT apical pneumothorax with chest tube in place. 2. Stable airspace disease in the LEFT lung with volume loss These results will be called to the ordering clinician or representative by the Radiologist Assistant, and communication documented in the PACS or zVision Dashboard. Electronically Signed   By: Suzy Bouchard M.D.   On: 12/18/2014 07:54   Dg Chest Port 1 View  12/17/2014  CLINICAL DATA:  Pericardial effusion. EXAM: PORTABLE CHEST 1 VIEW COMPARISON:  12/16/2014. FINDINGS: Left chest tube and pericardial drainage catheter stable position. Heart size stable. Progressive diffuse left lung infiltrate. Low lung volumes. No pleural effusion or pneumothorax. IMPRESSION: 1. Left chest tube and pericardial drainage catheter in stable position. 2.  Progressive diffuse left lung infiltrate. Electronically Signed   By: Marcello Moores  Register   On: 12/17/2014 07:46   Dg Chest Port 1 View  12/16/2014  CLINICAL DATA:  Follow-up  pleural effusion, known stage IV adenocarcinoma of the lung EXAM: PORTABLE CHEST 1 VIEW COMPARISON:  Portable chest x-ray of December 15, 2014 FINDINGS: The lungs remain mildly hypoinflated. The left-sided chest tube is unchanged with its tip projecting over the posterior medial aspect of the fourth rib. There is no definite pneumothorax. There is persistent interstitial and early alveolar airspace disease bilaterally. Slight interval improvement at the right lung base has occurred. No significant pleural effusion is observed. The cardiac silhouette is not enlarged. The pericardial drain is stable in appearance. IMPRESSION: Slight interval improvement in airspace opacities at the right lung base. Stable appearance of the left lung. The left-sided chest tube and the pericardial drain are is unchanged. Electronically Signed   By: David  Martinique M.D.   On: 12/16/2014 07:45    Impression and Plan: Non-small cell carcinoma of lung, stage 4 (HCC) Due to recent hospitalization for pericardial effusion requiring pericardial window, Tarceva oral therapy was placed on hold since 10/27 He reported tumor related pain on his left shoulder and  left upper chest area as well as left suprascapular area  His Tarceva was resumed on 11/17, tolerating it well. He continues his radiation treatments as directed, most recent treatment as of 11/30. These symptoms are now much improved If the patient experiences facial rashes but cannot afford clindamycin 1% gel. He was instructed to apply moisturizer, with Neosporin to help ameliorate these symptoms If diarrhea is present, he has been instructed to take  Imodium ID after each loose bowel movement, maximum 8 a day For his left irritation in his eye, this will be monitored, this time he should use warm compresses, And if these becomes worse over the next 24 hours he should call and we will placed in an antibiotic. The irritation appears at this time to be environmental. Patient is  scheduled to return in one month for labs and a follow-up visit. After that visit, and if the patient appears to be responding to Tarceva, we will proceed with staging CTs  The patient knows to call if he has any questions or concerns. This is a shared visit. Plan discussed with Dr. Julien Nordmann.    Rondel Jumbo, PA-C 12/1/201611:47 AM   ADDENDUM: Hematology/Oncology Attending: I had a face to face encounter with the patient. I recommended his care plan. This is a very pleasant 49 years old Hispanic male recently diagnosed with metastatic non-small cell lung cancer, adenocarcinoma with positive EGFR mutation. He is currently undergoing treatment with Tarceva 150 mg by mouth daily and tolerating his treatment fairly well except for mild skin rash on the face. He denied having any significant diarrhea. The patient denied having any weight loss or night sweats. He has no fever or chills. I recommended for the patient to continue his treatment with Tarceva at the same dose. He would come back for follow-up visit in one month's for reevaluation and repeat blood work. The patient was advised to call immediately if he has any concerning symptoms in the interval.  Disclaimer: This note was dictated with voice recognition software. Similar sounding words can inadvertently be transcribed and may be missed upon review. Eilleen Kempf., MD 01/16/2015

## 2015-01-15 ENCOUNTER — Encounter (HOSPITAL_COMMUNITY): Payer: Self-pay | Admitting: Anesthesiology

## 2015-01-15 ENCOUNTER — Encounter: Payer: Self-pay | Admitting: Radiation Oncology

## 2015-01-15 ENCOUNTER — Encounter: Payer: Self-pay | Admitting: Physician Assistant

## 2015-01-15 ENCOUNTER — Ambulatory Visit
Admission: RE | Admit: 2015-01-15 | Discharge: 2015-01-15 | Disposition: A | Discharge status: Home / Self Care | Payer: Medicaid Other | Source: Ambulatory Visit | Attending: Radiation Oncology | Admitting: Radiation Oncology

## 2015-01-15 VITALS — BP 109/66 | HR 100 | Temp 98.4°F | Resp 20 | Wt 172.0 lb

## 2015-01-15 DIAGNOSIS — C7951 Secondary malignant neoplasm of bone: Secondary | ICD-10-CM

## 2015-01-15 DIAGNOSIS — Z510 Encounter for antineoplastic radiation therapy: Secondary | ICD-10-CM | POA: Diagnosis not present

## 2015-01-15 NOTE — Progress Notes (Signed)
Department of Radiation Oncology  Phone:  [phone] Fax:        [phone]  Weekly Treatment Note    Name: Steven Roberts Date: 01/15/2015 MRN: [id number] DOB: 06-Oct-1965   Current dose: 35 Gy  Current fraction: 14   MEDICATIONS: Current Outpatient Prescriptions  Medication Sig Dispense Refill  . glimepiride (AMARYL) 2 MG tablet Take 1 tablet (2 mg total) by mouth daily with breakfast. 30 tablet 0  . hyaluronate sodium (RADIAPLEXRX) GEL Apply 1 application topically daily.    Marland Kitchen acetaminophen (TYLENOL) 500 MG tablet Take 500 mg by mouth every 6 (six) hours as needed.    . benzonatate (TESSALON) 200 MG capsule Take 1 capsule (200 mg total) by mouth 3 (three) times daily as needed for cough. (Patient not taking: Reported on 01/15/2015) 40 capsule 1  . guaifenesin (ROBITUSSIN CHEST CONGESTION) 100 MG/5ML syrup Take 200 mg by mouth 3 (three) times daily as needed for cough.    Marland Kitchen ibuprofen (ADVIL) 200 MG tablet Take 400 mg by mouth every 6 (six) hours as needed.     No current facility-administered medications for this encounter.     ALLERGIES: Hydrocodone-acetaminophen   LABORATORY DATA:  Lab Results  Component Value Date   WBC 6.1 01/14/2015   HGB 13.5 01/14/2015   HCT 41.3 01/14/2015   MCV 85.5 01/14/2015   PLT 253 01/14/2015   Lab Results  Component Value Date   NA 140 01/14/2015   K 4.3 01/14/2015   CL 102 12/19/2014   CO2 29 01/14/2015   Lab Results  Component Value Date   ALT 28 01/14/2015   AST 18 01/14/2015   ALKPHOS 89 01/14/2015   BILITOT 0.71 01/14/2015     NARRATIVE: Toby Breithaupt was seen today for weekly treatment management. The chart was checked and the patient's films were reviewed.  Weekly rad txs left lung, rib and rt hip. Patient doesn't have any pain. Patient denies nausea. His cough is almost gone. He is using vicks vapor rub on feet with socks at night, which has helped. Patient stated his appetite is good. He has reddened left eye  since Monday, which is itchy. He was told it is allergies by medical oncology yesterday, 12/1. He is using neosporin and hydrocortisone on his face and under eye. Using radiaplex on affected radiation areas, just dryness on skin. He denies pain in the chest area.  3:31 PM  BP 109/66 mmHg  Pulse 100  Temp(Src) 98.4 F (36.9 C) (Oral)  Resp 20  Wt 172 lb (78.019 kg)  SpO2 100%  Wt Readings from Last 3 Encounters:  01/15/15 172 lb (78.019 kg)  01/14/15 171 lb 3.2 oz (77.656 kg)  01/05/15 170 lb 3.2 oz (77.202 kg)    PHYSICAL EXAMINATION: weight is 172 lb (78.019 kg). His oral temperature is 98.4 F (36.9 C). His blood pressure is 109/66 and his pulse is 100. His respiration is 20 and oxygen saturation is 100%.        ASSESSMENT: The patient is doing satisfactorily with treatment.  PLAN: We will continue with the patient's radiation treatment as planned. He is finished with treatment Monday, 12/5. He has a follow up appointment on 02/19/15.      This document serves as a record of services personally performed by Tyler Pita, MD. It was created on his behalf by Lendon Collar, a trained medical scribe. The creation of this record is based on the scribe's personal observations and the provider's statements to them. This  document has been checked and approved by the attending provider.

## 2015-01-15 NOTE — Progress Notes (Signed)
weekly rad txs left lung, rib and rt hip, no pain, no nausea, cough almost gone, using vicks vapor rub on feet  With socks, at night  Which has helped stated , appetite good, has reddened left eye since Monday, itchy, was told allergy  By med onc , using neosporin  and hydrocortisone on face and under eye,  Using radiaplex on affected radiation areas, just dryness on skin, f/u appt 02/19/15  2:02 PM BP 109/66 mmHg  Pulse 100  Temp(Src) 98.4 F (36.9 C) (Oral)  Resp 20  Wt 172 lb (78.019 kg)  SpO2 100%  Wt Readings from Last 3 Encounters:  01/15/15 172 lb (78.019 kg)  01/14/15 171 lb 3.2 oz (77.656 kg)  01/05/15 170 lb 3.2 oz (77.202 kg)

## 2015-01-18 ENCOUNTER — Ambulatory Visit
Admission: RE | Admit: 2015-01-18 | Discharge: 2015-01-18 | Disposition: A | Discharge status: Home / Self Care | Payer: Medicaid Other | Source: Ambulatory Visit | Attending: Radiation Oncology | Admitting: Radiation Oncology

## 2015-01-18 ENCOUNTER — Encounter: Payer: Self-pay | Admitting: Radiation Oncology

## 2015-01-18 ENCOUNTER — Ambulatory Visit: Payer: Medicaid Other

## 2015-01-18 DIAGNOSIS — Z510 Encounter for antineoplastic radiation therapy: Secondary | ICD-10-CM | POA: Diagnosis not present

## 2015-01-19 ENCOUNTER — Ambulatory Visit: Payer: Medicaid Other

## 2015-01-20 ENCOUNTER — Ambulatory Visit: Payer: Medicaid Other

## 2015-01-21 ENCOUNTER — Ambulatory Visit: Payer: Medicaid Other

## 2015-01-22 ENCOUNTER — Ambulatory Visit: Payer: Medicaid Other

## 2015-01-23 LAB — AFB CULTURE WITH SMEAR (NOT AT ARMC): Acid Fast Smear: NONE SEEN

## 2015-01-26 ENCOUNTER — Ambulatory Visit: Payer: Medicaid Other

## 2015-01-27 NOTE — Progress Notes (Signed)
  Radiation Oncology         [phone] ________________________________  Name: Steven Roberts MRN: [id number]  Date: 01/18/2015  DOB: 1965-08-04  End of Treatment Note  Diagnosis:   Metastatic lung cancer     Indication for treatment::  palliative       Radiation treatment dates:   12/07/2014 through 01/18/2015  Site/dose:    1.  Left lung/left lateral rib.  The patient received 37.5 gray in 15 fractions using a 3-D conformal technique.  2.  Right hip   the patient concurrently received 37.5 gray to this region in 15 fractions using a 2 field technique.   Narrative: The patient tolerated radiation treatment relatively well.   The patient's breathing improved during the course of treatment. He underwent another planning session which was required because his left lung significantly opened up during treatment which was very beneficial.  Plan: The patient has completed radiation treatment. The patient will return to radiation oncology clinic for routine followup in one month. I advised the patient to call or return sooner if they have any questions or concerns related to their recovery or treatment. ________________________________  Jodelle Gross, M.D., Ph.D.

## 2015-01-27 NOTE — Progress Notes (Signed)
  Radiation Oncology         [phone] ________________________________  Name: Steven Roberts MRN: [id number]  Date: 01/05/2015  DOB: 08-11-1965  SIMULATION AND TREATMENT PLANNING NOTE  DIAGNOSIS:  Metastatic lung cancer  Site:  Left lung and left lateral rib  NARRATIVE:  The patient was brought to the [facility].  He will undergo a second re-simulation given significant changes in the lung which need to be accounted for with a new radiation plan based on his current anatomy. Identity was confirmed.  All relevant records and images related to the planned course of therapy were reviewed.   Written consent to proceed with treatment was confirmed which was freely given after reviewing the details related to the planned course of therapy had been reviewed with the patient.  Then, the patient was set-up in a stable reproducible  supine position for radiation therapy.  CT images were obtained.  Surface markings were placed.     The CT images were loaded into the planning software.  Then the target and avoidance structures were contoured.  Treatment planning then occurred.  The radiation prescription was entered and confirmed.  A total of 3 complex treatment devices were fabricated which relate to the designed radiation treatment fields. Each of these customized fields/ complex treatment devices will be used on a daily basis during the radiation course. I have requested : Isodose Plan.   PLAN:  The patient will receive 37.5 Gy in 15 fractions total as was originally planned.  ________________________________   Jodelle Gross, MD, PhD

## 2015-02-10 ENCOUNTER — Encounter: Payer: Self-pay | Admitting: Radiation Oncology

## 2015-02-18 ENCOUNTER — Other Ambulatory Visit (HOSPITAL_BASED_OUTPATIENT_CLINIC_OR_DEPARTMENT_OTHER): Payer: Self-pay

## 2015-02-18 ENCOUNTER — Encounter: Payer: Self-pay | Admitting: Internal Medicine

## 2015-02-18 ENCOUNTER — Telehealth: Payer: Self-pay | Admitting: Internal Medicine

## 2015-02-18 ENCOUNTER — Ambulatory Visit (HOSPITAL_BASED_OUTPATIENT_CLINIC_OR_DEPARTMENT_OTHER): Payer: Self-pay | Admitting: Internal Medicine

## 2015-02-18 VITALS — BP 117/63 | HR 64 | Temp 98.1°F | Resp 18 | Ht 68.0 in | Wt 181.9 lb

## 2015-02-18 DIAGNOSIS — R609 Edema, unspecified: Secondary | ICD-10-CM

## 2015-02-18 DIAGNOSIS — C7951 Secondary malignant neoplasm of bone: Secondary | ICD-10-CM

## 2015-02-18 DIAGNOSIS — R05 Cough: Secondary | ICD-10-CM

## 2015-02-18 DIAGNOSIS — C3412 Malignant neoplasm of upper lobe, left bronchus or lung: Secondary | ICD-10-CM

## 2015-02-18 DIAGNOSIS — R21 Rash and other nonspecific skin eruption: Secondary | ICD-10-CM

## 2015-02-18 DIAGNOSIS — C3490 Malignant neoplasm of unspecified part of unspecified bronchus or lung: Secondary | ICD-10-CM

## 2015-02-18 DIAGNOSIS — D6481 Anemia due to antineoplastic chemotherapy: Secondary | ICD-10-CM

## 2015-02-18 DIAGNOSIS — R918 Other nonspecific abnormal finding of lung field: Secondary | ICD-10-CM

## 2015-02-18 DIAGNOSIS — E46 Unspecified protein-calorie malnutrition: Secondary | ICD-10-CM

## 2015-02-18 DIAGNOSIS — T451X5A Adverse effect of antineoplastic and immunosuppressive drugs, initial encounter: Secondary | ICD-10-CM

## 2015-02-18 DIAGNOSIS — E860 Dehydration: Secondary | ICD-10-CM

## 2015-02-18 LAB — CBC WITH DIFFERENTIAL/PLATELET
BASO%: 0.4 % (ref 0.0–2.0)
BASOS ABS: 0 10*3/uL (ref 0.0–0.1)
EOS ABS: 0.2 10*3/uL (ref 0.0–0.5)
EOS%: 4.4 % (ref 0.0–7.0)
HEMATOCRIT: 42.2 % (ref 38.4–49.9)
HEMOGLOBIN: 13.7 g/dL (ref 13.0–17.1)
LYMPH%: 18.7 % (ref 14.0–49.0)
MCH: 27.8 pg (ref 27.2–33.4)
MCHC: 32.5 g/dL (ref 32.0–36.0)
MCV: 85.8 fL (ref 79.3–98.0)
MONO#: 0.4 10*3/uL (ref 0.1–0.9)
MONO%: 6.6 % (ref 0.0–14.0)
NEUT%: 69.9 % (ref 39.0–75.0)
NEUTROS ABS: 3.8 10*3/uL (ref 1.5–6.5)
PLATELETS: 348 10*3/uL (ref 140–400)
RBC: 4.92 10*6/uL (ref 4.20–5.82)
RDW: 15.5 % — AB (ref 11.0–14.6)
WBC: 5.5 10*3/uL (ref 4.0–10.3)
lymph#: 1 10*3/uL (ref 0.9–3.3)

## 2015-02-18 LAB — COMPREHENSIVE METABOLIC PANEL
ALBUMIN: 3.5 g/dL (ref 3.5–5.0)
ALK PHOS: 72 U/L (ref 40–150)
ALT: 25 U/L (ref 0–55)
ANION GAP: 7 meq/L (ref 3–11)
AST: 18 U/L (ref 5–34)
BILIRUBIN TOTAL: 0.67 mg/dL (ref 0.20–1.20)
BUN: 11 mg/dL (ref 7.0–26.0)
CALCIUM: 9.6 mg/dL (ref 8.4–10.4)
CO2: 27 mEq/L (ref 22–29)
Chloride: 105 mEq/L (ref 98–109)
Creatinine: 0.8 mg/dL (ref 0.7–1.3)
GLUCOSE: 107 mg/dL (ref 70–140)
POTASSIUM: 4 meq/L (ref 3.5–5.1)
SODIUM: 140 meq/L (ref 136–145)
TOTAL PROTEIN: 7.2 g/dL (ref 6.4–8.3)

## 2015-02-18 NOTE — Progress Notes (Signed)
[place] [phone]   [phone]  OFFICE PROGRESS NOTE  Pcp Not In System No address on file  DIAGNOSIS: Stage IV (T1b, N3, M1b) non-small cell lung cancer, adenocarcinoma with positive EGFR mutation in exon 21 ([id number]) presented with left upper lobe lung mass in addition to bilateral pulmonary nodules and mediastinal as well as supraclavicular lymphadenopathy and metastatic bone lesions diagnosed in September 2016.   PRIOR THERAPY:  1) status post subxiphoid pericardial window for cardiac tamponade as well as a right chest tube placement for drainage of right pleural effusion under the care of Dr. Roxy Manns. 2) Palliative radiotherapy to the left lung/left lateral rib as well as a right hip under the care of Dr. Lisbeth Renshaw completed on 01/18/2015  CURRENT THERAPY: Tarceva 150 mg by mouth daily. Status post 3 months of treatment.  INTERVAL HISTORY: Steven Roberts 50 y.o. male returns to the clinic today for follow-up visit accompanied by his wife. The patient is feeling fine today with no specific complaints except for mild skin rash. He has no significant diarrhea. He denied having any significant chest pain, shortness of breath but has mild cough with no hemoptysis. He has no significant weight loss or night sweats. He has no nausea or vomiting. He is tolerating his treatment with Tarceva fairly well. He had repeat CBC and comprehensive metabolic panel performed earlier today and is here for evaluation and discussion of his lab results.   MEDICAL HISTORY: Past Medical History  Diagnosis Date  . Bone cancer ([place]) 11/03/14 Pet scan    left 6th rib, right hip  . Lung cancer ([place]) 10/23/14    LUL adenocarcinoma, non-small cell  . Pericardial effusion with cardiac tamponade 12/11/2014  . Pulmonary infiltrates  c/w Adenoca/ Stage IV  10/13/2014    - symptom onset march 2016 - Baseline cxr on July 20 2014 reported to be nl  > then CT chest 10/13/2014 LUL as dz  Assoc  with   LUL nodular density/ mild adenopathy - Quantiferon Gold 10/13/2014 > neg  - FOB 10/23/14 > cobblestoning beyond Lingular orifice with diffuse narrowing x 50% of LUL beyond lingula> adenoca - PET 11/03/14 1. The dominant left upper lobe mass and extensive adenopathy involving th  . Non-small cell carcinoma of lung, stage 4 ([place]) 11/12/2014  . Hypoalbuminemia due to protein-calorie malnutrition ([place]) 12/02/2014  . Hyponatremia 12/10/2014  . Hyperphosphatemia 12/02/2014  . Bone metastasis ([place]) 11/25/2014  . S/P radiation therapy 12/07/14-01/18/15    left lung/lateral rib /rt hip    ALLERGIES:  is allergic to hydrocodone-acetaminophen.  MEDICATIONS:  Current Outpatient Prescriptions  Medication Sig Dispense Refill  . benzonatate (TESSALON) 200 MG capsule Take 1 capsule (200 mg total) by mouth 3 (three) times daily as needed for cough. 40 capsule 1  . glimepiride (AMARYL) 2 MG tablet Take 1 tablet (2 mg total) by mouth daily with breakfast. 30 tablet 0  . acetaminophen (TYLENOL) 500 MG tablet Take 500 mg by mouth every 6 (six) hours as needed. Reported on 02/18/2015    . guaifenesin (ROBITUSSIN CHEST CONGESTION) 100 MG/5ML syrup Take 200 mg by mouth 3 (three) times daily as needed for cough. Reported on 02/18/2015    . hyaluronate sodium (RADIAPLEXRX) GEL Apply 1 application topically daily. Reported on 02/18/2015    . ibuprofen (ADVIL) 200 MG tablet Take 400 mg by mouth every 6 (six) hours as needed. Reported on 02/18/2015     No current facility-administered medications for this  visit.    SURGICAL HISTORY:  Past Surgical History  Procedure Laterality Date  . Video bronchoscopy Bilateral 10/23/2014    Procedure: VIDEO BRONCHOSCOPY WITHOUT FLUORO;  Surgeon: Tanda Rockers, MD;  Location: [facility];  Service: Cardiopulmonary;  Laterality: Bilateral;  . Subxyphoid pericardial window N/A 12/11/2014    Procedure: SUBXYPHOID PERICARDIAL WINDOW;  Surgeon: Rexene Alberts, MD;  Location: [facility];   Service: Thoracic;  Laterality: N/A;  . Tee without cardioversion N/A 12/11/2014    Procedure: TRANSESOPHAGEAL ECHOCARDIOGRAM (TEE);  Surgeon: Rexene Alberts, MD;  Location: [facility] OR;  Service: Thoracic;  Laterality: N/A;    REVIEW OF SYSTEMS:  A comprehensive review of systems was negative except for: Constitutional: positive for fatigue Respiratory: positive for cough Integument/breast: positive for rash   PHYSICAL EXAMINATION: General appearance: alert, cooperative, fatigued and no distress Head: Normocephalic, without obvious abnormality, atraumatic Neck: no adenopathy, no JVD, supple, symmetrical, trachea midline and thyroid not enlarged, symmetric, no tenderness/mass/nodules Lymph nodes: Cervical, supraclavicular, and axillary nodes normal. Resp: clear to auscultation bilaterally Back: symmetric, no curvature. ROM normal. No CVA tenderness. Cardio: regular rate and rhythm, S1, S2 normal, no murmur, click, rub or gallop GI: soft, non-tender; bowel sounds normal; no masses,  no organomegaly Extremities: extremities normal, atraumatic, no cyanosis or edema Neurologic: Alert and oriented X 3, normal strength and tone. Normal symmetric reflexes. Normal coordination and gait  ECOG PERFORMANCE STATUS: 1 - Symptomatic but completely ambulatory  Blood pressure 117/63, pulse 64, temperature 98.1 F (36.7 C), temperature source Oral, resp. rate 18, height 5' 8"  (1.727 m), weight 181 lb 14.4 oz (82.509 kg), SpO2 100 %.  LABORATORY DATA: Lab Results  Component Value Date   WBC 5.5 02/18/2015   HGB 13.7 02/18/2015   HCT 42.2 02/18/2015   MCV 85.8 02/18/2015   PLT 348 02/18/2015      Chemistry      Component Value Date/Time   NA 140 02/18/2015 1102   NA 138 12/19/2014 0336   K 4.0 02/18/2015 1102   K 3.8 12/19/2014 0336   CL 102 12/19/2014 0336   CO2 27 02/18/2015 1102   CO2 28 12/19/2014 0336   BUN 11.0 02/18/2015 1102   BUN 9 12/19/2014 0336   CREATININE 0.8 02/18/2015 1102    CREATININE 0.56* 12/19/2014 0336      Component Value Date/Time   CALCIUM 9.6 02/18/2015 1102   CALCIUM 8.4* 12/19/2014 0336   ALKPHOS 72 02/18/2015 1102   ALKPHOS 124 12/19/2014 1122   AST 18 02/18/2015 1102   AST 39 12/19/2014 1122   ALT 25 02/18/2015 1102   ALT 73* 12/19/2014 1122   BILITOT 0.67 02/18/2015 1102   BILITOT 0.8 12/19/2014 1122       RADIOGRAPHIC STUDIES: No results found.  ASSESSMENT AND PLAN: This is a very pleasant 50 years old Hispanic male recently diagnosed with metastatic non-small cell lung cancer, adenocarcinoma with positive EGFR mutation in exon 21. He is currently undergoing treatment with oral Tarceva 150 mg by mouth daily status post 3 months and tolerating his treatment well except for skin rash mainly on the face. I recommended for the patient to continue his current treatment with Tarceva with the same dose. I would see him back for follow-up visit in 2 weeks for evaluation after repeating CT scan of the chest, abdomen and pelvis for restaging of his disease. For the dry cough, I recommended for the patient to take [facility] as well as Dylsem if needed He was  advised to call immediately if he has any concerning symptoms in the interval. The patient voices understanding of current disease status and treatment options and is in agreement with the current care plan.  All questions were answered. The patient knows to call the clinic with any problems, questions or concerns. We can certainly see the patient much sooner if necessary.  I spent 15 minutes counseling the patient face to face. The total time spent in the appointment was 25 minutes.  Disclaimer: This note was dictated with voice recognition software. Similar sounding words can inadvertently be transcribed and may not be corrected upon review.

## 2015-02-18 NOTE — Telephone Encounter (Signed)
Gv pt appt for 1/24 as there is no avail slot in 2wks. Gave pt contrast and advised C-sched will call to sched ct scan.

## 2015-02-19 ENCOUNTER — Ambulatory Visit
Admission: RE | Admit: 2015-02-19 | Discharge: 2015-02-19 | Disposition: A | Discharge status: Home / Self Care | Payer: Self-pay | Source: Ambulatory Visit | Attending: Radiation Oncology | Admitting: Radiation Oncology

## 2015-02-19 ENCOUNTER — Encounter: Payer: Self-pay | Admitting: Radiation Oncology

## 2015-02-19 VITALS — BP 115/63 | HR 76 | Temp 97.3°F | Resp 20 | Ht 68.0 in | Wt 184.9 lb

## 2015-02-19 DIAGNOSIS — C7951 Secondary malignant neoplasm of bone: Secondary | ICD-10-CM

## 2015-02-19 HISTORY — DX: Personal history of irradiation: Z92.3

## 2015-02-19 NOTE — Progress Notes (Addendum)
Follow up s/p lung rad txs 12/07/14-01/18/15, still has non productive cough,no pain, no nausea, appetite good, feels a lot better stated, had flu over new years, saw Dr. Rodena Medin CT ordered, scheduled 03/01/14, f/u with Dr. Julien Nordmann 03/09/15 BP 115/63 mmHg  Pulse 76  Temp(Src) 97.3 F (36.3 C) (Oral)  Resp 20  Ht '5\' 8"'$  (1.727 m)  Wt 184 lb 14.4 oz (83.87 kg)  BMI 28.12 kg/m2  SpO2 100%  Wt Readings from Last 3 Encounters:  02/19/15 184 lb 14.4 oz (83.87 kg)  02/18/15 181 lb 14.4 oz (82.509 kg)  01/15/15 172 lb (78.019 kg)

## 2015-02-23 ENCOUNTER — Encounter: Payer: Self-pay | Admitting: Radiation Oncology

## 2015-02-23 NOTE — Progress Notes (Signed)
Radiation Oncology         [phone] ________________________________  Name: Steven Roberts MRN: [id number]  Date: 02/19/2015  DOB: December 28, 1965  Follow-Up Visit Note  CC: Pcp Not In System  Curt Bears, MD  Diagnosis:      ICD-9-CM ICD-10-CM   1. Bone metastasis ([facility]) 198.5 C79.51      Interval Since Last Radiation:  The patient completed palliative external beam radiation treatment on 01/18/2015   Narrative:  The patient returns today for routine follow-up.   Follow up s/p lung rad txs 12/07/14-01/18/15, still has non productive cough,no pain, no nausea, appetite good, feels a lot better stated, had flu over new years, saw Dr. Rodena Medin CT ordered, scheduled 03/01/14, f/u with Dr. Julien Nordmann 03/09/15 BP 115/63 mmHg  Pulse 76  Temp(Src) 97.3 F (36.3 C) (Oral)  Resp 20  Ht '5\' 8"'$  (1.727 m)  Wt 184 lb 14.4 oz (83.87 kg)  BMI 28.12 kg/m2  SpO2 100%  Wt Readings from Last 3 Encounters:  02/19/15 184 lb 14.4 oz (83.87 kg)  02/18/15 181 lb 14.4 oz (82.509 kg)  01/15/15 172 lb (78.019 kg)                                ALLERGIES:  is allergic to hydrocodone-acetaminophen.  Meds: Current Outpatient Prescriptions  Medication Sig Dispense Refill  . acetaminophen (TYLENOL) 500 MG tablet Take 500 mg by mouth every 6 (six) hours as needed. Reported on 02/18/2015    . benzonatate (TESSALON) 200 MG capsule Take 1 capsule (200 mg total) by mouth 3 (three) times daily as needed for cough. 40 capsule 1  . glimepiride (AMARYL) 2 MG tablet Take 1 tablet (2 mg total) by mouth daily with breakfast. 30 tablet 0  . ibuprofen (ADVIL) 200 MG tablet Take 400 mg by mouth every 6 (six) hours as needed. Reported on 02/18/2015    . guaifenesin (ROBITUSSIN CHEST CONGESTION) 100 MG/5ML syrup Take 200 mg by mouth 3 (three) times daily as needed for cough. Reported on 02/19/2015    . hyaluronate sodium (RADIAPLEXRX) GEL Apply 1 application topically daily. Reported on 02/19/2015     No current  facility-administered medications for this encounter.    Physical Findings: The patient is in no acute distress. Patient is alert and oriented.  height is '5\' 8"'$  (1.727 m) and weight is 184 lb 14.4 oz (83.87 kg). His oral temperature is 97.3 F (36.3 C). His blood pressure is 115/63 and his pulse is 76. His respiration is 20 and oxygen saturation is 100%. .     Lab Findings: Lab Results  Component Value Date   WBC 5.5 02/18/2015   HGB 13.7 02/18/2015   HCT 42.2 02/18/2015   MCV 85.8 02/18/2015   PLT 348 02/18/2015     Radiographic Findings: No results found.  Impression:    The patient is very pleased that he is not having significant pain at this time. He has had a very good response to palliative radiation treatment. He is continuing with Tarceva chemotherapy which she is tolerating well. Ongoing follow-up with Dr. Earlie Server in medical oncology.  Plan:  The patient will return to our clinic on a when necessary basis. The patient knows to contact her obvious if he can be of any further assistance. I would be more than happy to see the patient if there is an additional need for further palliative radiation treatment.   Jenny Reichmann  S. Lisbeth Renshaw, M.D., Ph.D.

## 2015-03-02 ENCOUNTER — Ambulatory Visit (HOSPITAL_COMMUNITY)
Admission: RE | Admit: 2015-03-02 | Discharge: 2015-03-02 | Disposition: A | Discharge status: Home / Self Care | Payer: Medicaid Other | Source: Ambulatory Visit | Attending: Internal Medicine | Admitting: Internal Medicine

## 2015-03-02 ENCOUNTER — Encounter (HOSPITAL_COMMUNITY): Payer: Self-pay

## 2015-03-02 DIAGNOSIS — R590 Localized enlarged lymph nodes: Secondary | ICD-10-CM | POA: Insufficient documentation

## 2015-03-02 DIAGNOSIS — R21 Rash and other nonspecific skin eruption: Secondary | ICD-10-CM

## 2015-03-02 DIAGNOSIS — K769 Liver disease, unspecified: Secondary | ICD-10-CM | POA: Insufficient documentation

## 2015-03-02 DIAGNOSIS — Z923 Personal history of irradiation: Secondary | ICD-10-CM | POA: Insufficient documentation

## 2015-03-02 DIAGNOSIS — M47814 Spondylosis without myelopathy or radiculopathy, thoracic region: Secondary | ICD-10-CM | POA: Insufficient documentation

## 2015-03-02 DIAGNOSIS — C7951 Secondary malignant neoplasm of bone: Secondary | ICD-10-CM

## 2015-03-02 DIAGNOSIS — C3412 Malignant neoplasm of upper lobe, left bronchus or lung: Secondary | ICD-10-CM | POA: Insufficient documentation

## 2015-03-02 DIAGNOSIS — R918 Other nonspecific abnormal finding of lung field: Secondary | ICD-10-CM | POA: Insufficient documentation

## 2015-03-02 HISTORY — DX: Type 2 diabetes mellitus without complications: E11.9

## 2015-03-02 MED ORDER — IOHEXOL 300 MG/ML  SOLN
100.0000 mL | Freq: Once | INTRAMUSCULAR | Status: AC | PRN
  Administered 2015-03-02: 100 mL via INTRAVENOUS

## 2015-03-09 ENCOUNTER — Telehealth: Payer: Self-pay | Admitting: Internal Medicine

## 2015-03-09 ENCOUNTER — Ambulatory Visit (HOSPITAL_BASED_OUTPATIENT_CLINIC_OR_DEPARTMENT_OTHER): Payer: Self-pay | Admitting: Internal Medicine

## 2015-03-09 ENCOUNTER — Other Ambulatory Visit (HOSPITAL_BASED_OUTPATIENT_CLINIC_OR_DEPARTMENT_OTHER): Payer: Self-pay

## 2015-03-09 ENCOUNTER — Encounter: Payer: Self-pay | Admitting: Internal Medicine

## 2015-03-09 VITALS — BP 111/65 | HR 70 | Temp 98.3°F | Resp 18 | Ht 68.0 in | Wt 180.8 lb

## 2015-03-09 DIAGNOSIS — C7951 Secondary malignant neoplasm of bone: Secondary | ICD-10-CM

## 2015-03-09 DIAGNOSIS — C3412 Malignant neoplasm of upper lobe, left bronchus or lung: Secondary | ICD-10-CM

## 2015-03-09 DIAGNOSIS — R21 Rash and other nonspecific skin eruption: Secondary | ICD-10-CM

## 2015-03-09 DIAGNOSIS — Z5111 Encounter for antineoplastic chemotherapy: Secondary | ICD-10-CM | POA: Insufficient documentation

## 2015-03-09 DIAGNOSIS — R918 Other nonspecific abnormal finding of lung field: Secondary | ICD-10-CM

## 2015-03-09 DIAGNOSIS — R05 Cough: Secondary | ICD-10-CM

## 2015-03-09 HISTORY — DX: Encounter for antineoplastic chemotherapy: Z51.11

## 2015-03-09 LAB — CBC WITH DIFFERENTIAL/PLATELET
BASO%: 0.5 % (ref 0.0–2.0)
Basophils Absolute: 0 10*3/uL (ref 0.0–0.1)
EOS ABS: 0.2 10*3/uL (ref 0.0–0.5)
EOS%: 2.8 % (ref 0.0–7.0)
HCT: 42.6 % (ref 38.4–49.9)
HEMOGLOBIN: 14.2 g/dL (ref 13.0–17.1)
LYMPH%: 12.3 % — ABNORMAL LOW (ref 14.0–49.0)
MCH: 28 pg (ref 27.2–33.4)
MCHC: 33.3 g/dL (ref 32.0–36.0)
MCV: 84.2 fL (ref 79.3–98.0)
MONO#: 0.6 10*3/uL (ref 0.1–0.9)
MONO%: 10.2 % (ref 0.0–14.0)
NEUT%: 74.2 % (ref 39.0–75.0)
NEUTROS ABS: 4.5 10*3/uL (ref 1.5–6.5)
Platelets: 263 10*3/uL (ref 140–400)
RBC: 5.06 10*6/uL (ref 4.20–5.82)
RDW: 16 % — AB (ref 11.0–14.6)
WBC: 6.1 10*3/uL (ref 4.0–10.3)
lymph#: 0.8 10*3/uL — ABNORMAL LOW (ref 0.9–3.3)

## 2015-03-09 LAB — COMPREHENSIVE METABOLIC PANEL
ALBUMIN: 3.5 g/dL (ref 3.5–5.0)
ALK PHOS: 73 U/L (ref 40–150)
ALT: 16 U/L (ref 0–55)
AST: 15 U/L (ref 5–34)
Anion Gap: 6 mEq/L (ref 3–11)
BILIRUBIN TOTAL: 1.35 mg/dL — AB (ref 0.20–1.20)
BUN: 11.8 mg/dL (ref 7.0–26.0)
CO2: 28 meq/L (ref 22–29)
Calcium: 9.6 mg/dL (ref 8.4–10.4)
Chloride: 106 mEq/L (ref 98–109)
Creatinine: 0.9 mg/dL (ref 0.7–1.3)
EGFR: >90 mL/min/{1.73_m2} (ref >90)
GLUCOSE: 101 mg/dL (ref 70–140)
Potassium: 4.2 mEq/L (ref 3.5–5.1)
SODIUM: 140 meq/L (ref 136–145)
TOTAL PROTEIN: 7.3 g/dL (ref 6.4–8.3)

## 2015-03-09 MED ORDER — HYDROCODONE-HOMATROPINE 5-1.5 MG/5ML PO SYRP: 5.0000 mL | ORAL_SOLUTION | Freq: Four times a day (QID) | ORAL | Status: DC | PRN

## 2015-03-09 NOTE — Progress Notes (Signed)
[place] [phone]   [phone]  OFFICE PROGRESS NOTE  Pcp Not In System No address on file  DIAGNOSIS: Stage IV (T1b, N3, M1b) non-small cell lung cancer, adenocarcinoma with positive EGFR mutation in exon 21 ([id number]) presented with left upper lobe lung mass in addition to bilateral pulmonary nodules and mediastinal as well as supraclavicular lymphadenopathy and metastatic bone lesions diagnosed in September 2016.   PRIOR THERAPY:  1) status post subxiphoid pericardial window for cardiac tamponade as well as a right chest tube placement for drainage of right pleural effusion under the care of Dr. Roxy Manns. 2) Palliative radiotherapy to the left lung/left lateral rib as well as a right hip under the care of Dr. Lisbeth Renshaw completed on 01/18/2015  CURRENT THERAPY: Tarceva 150 mg by mouth daily. Status post 4 months of treatment.  INTERVAL HISTORY: Steven Roberts 50 y.o. male returns to the clinic today for follow-up visit accompanied by his wife. The patient is feeling fine today with no specific complaints except for mild skin rash and dry cough. He has one or 2 episodes diarrhea every day after lunch improved with Imodium. He denied having any significant chest pain, shortness of breath but has dry cough with no hemoptysis. He has no significant weight loss or night sweats. He has no nausea or vomiting. He is tolerating his treatment with Tarceva fairly well. He had repeat CBC and comprehensive metabolic panel as well as CT scan of the chest, abdomen and pelvis performed recently and he is here for evaluation and discussion of his lab and his scan results.  MEDICAL HISTORY: Past Medical History  Diagnosis Date  . Bone cancer ([place]) 11/03/14 Pet scan    left 6th rib, right hip  . Lung cancer ([place]) 10/23/14    LUL adenocarcinoma, non-small cell  . Pericardial effusion with cardiac tamponade 12/11/2014  . Pulmonary infiltrates  c/w Adenoca/ Stage IV  10/13/2014      - symptom onset march 2016 - Baseline cxr on July 20 2014 reported to be nl  > then CT chest 10/13/2014 LUL as dz  Assoc with   LUL nodular density/ mild adenopathy - Quantiferon Gold 10/13/2014 > neg  - FOB 10/23/14 > cobblestoning beyond Lingular orifice with diffuse narrowing x 50% of LUL beyond lingula> adenoca - PET 11/03/14 1. The dominant left upper lobe mass and extensive adenopathy involving th  . Non-small cell carcinoma of lung, stage 4 ([place]) 11/12/2014  . Hypoalbuminemia due to protein-calorie malnutrition ([place]) 12/02/2014  . Hyponatremia 12/10/2014  . Hyperphosphatemia 12/02/2014  . Bone metastasis ([place]) 11/25/2014  . S/P radiation therapy 12/07/14-01/18/15    left lung/lateral rib /rt hip  . Diabetes mellitus without complication ([facility])     ALLERGIES:  is allergic to hydrocodone-acetaminophen.  MEDICATIONS:  Current Outpatient Prescriptions  Medication Sig Dispense Refill  . benzonatate (TESSALON) 200 MG capsule Take 1 capsule (200 mg total) by mouth 3 (three) times daily as needed for cough. 40 capsule 1  . glimepiride (AMARYL) 2 MG tablet Take 1 tablet (2 mg total) by mouth daily with breakfast. 30 tablet 0  . acetaminophen (TYLENOL) 500 MG tablet Take 500 mg by mouth every 6 (six) hours as needed. Reported on 03/09/2015    . guaifenesin (ROBITUSSIN CHEST CONGESTION) 100 MG/5ML syrup Take 200 mg by mouth 3 (three) times daily as needed for cough. Reported on 03/09/2015    . hyaluronate sodium (RADIAPLEXRX) GEL Apply 1 application topically daily. Reported on  03/09/2015    . ibuprofen (ADVIL) 200 MG tablet Take 400 mg by mouth every 6 (six) hours as needed. Reported on 03/09/2015     No current facility-administered medications for this visit.    SURGICAL HISTORY:  Past Surgical History  Procedure Laterality Date  . Video bronchoscopy Bilateral 10/23/2014    Procedure: VIDEO BRONCHOSCOPY WITHOUT FLUORO;  Surgeon: Tanda Rockers, MD;  Location: [facility];  Service:  Cardiopulmonary;  Laterality: Bilateral;  . Subxyphoid pericardial window N/A 12/11/2014    Procedure: SUBXYPHOID PERICARDIAL WINDOW;  Surgeon: Rexene Alberts, MD;  Location: [place] [facility];  Service: Thoracic;  Laterality: N/A;  . Tee without cardioversion N/A 12/11/2014    Procedure: TRANSESOPHAGEAL ECHOCARDIOGRAM (TEE);  Surgeon: Rexene Alberts, MD;  Location: [facility] [facility];  Service: Thoracic;  Laterality: N/A;    REVIEW OF SYSTEMS:  Constitutional: positive for fatigue Eyes: negative Ears, nose, mouth, throat, and face: negative Respiratory: positive for cough Cardiovascular: negative Gastrointestinal: negative Genitourinary:negative Integument/breast: negative Hematologic/lymphatic: negative Musculoskeletal:negative Neurological: negative Behavioral/Psych: negative Endocrine: negative Allergic/Immunologic: negative   PHYSICAL EXAMINATION: General appearance: alert, cooperative, fatigued and no distress Head: Normocephalic, without obvious abnormality, atraumatic Neck: no adenopathy, no JVD, supple, symmetrical, trachea midline and thyroid not enlarged, symmetric, no tenderness/mass/nodules Lymph nodes: Cervical, supraclavicular, and axillary nodes normal. Resp: clear to auscultation bilaterally Back: symmetric, no curvature. ROM normal. No CVA tenderness. Cardio: regular rate and rhythm, S1, S2 normal, no murmur, click, rub or gallop GI: soft, non-tender; bowel sounds normal; no masses,  no organomegaly Extremities: extremities normal, atraumatic, no cyanosis or edema Neurologic: Alert and oriented X 3, normal strength and tone. Normal symmetric reflexes. Normal coordination and gait  ECOG PERFORMANCE STATUS: 1 - Symptomatic but completely ambulatory  Blood pressure 111/65, pulse 70, temperature 98.3 F (36.8 C), temperature source Oral, resp. rate 18, height 5' 8"  (1.727 m), weight 180 lb 12.8 oz (82.01 kg), SpO2 100 %.  LABORATORY DATA: Lab Results  Component Value Date   WBC 6.1  03/09/2015   HGB 14.2 03/09/2015   HCT 42.6 03/09/2015   MCV 84.2 03/09/2015   PLT 263 03/09/2015      Chemistry      Component Value Date/Time   NA 140 03/09/2015 0847   NA 138 12/19/2014 0336   K 4.2 03/09/2015 0847   K 3.8 12/19/2014 0336   CL 102 12/19/2014 0336   CO2 28 03/09/2015 0847   CO2 28 12/19/2014 0336   BUN 11.8 03/09/2015 0847   BUN 9 12/19/2014 0336   CREATININE 0.9 03/09/2015 0847   CREATININE 0.56* 12/19/2014 0336      Component Value Date/Time   CALCIUM 9.6 03/09/2015 0847   CALCIUM 8.4* 12/19/2014 0336   ALKPHOS 73 03/09/2015 0847   ALKPHOS 124 12/19/2014 1122   AST 15 03/09/2015 0847   AST 39 12/19/2014 1122   ALT 16 03/09/2015 0847   ALT 73* 12/19/2014 1122   BILITOT 1.35* 03/09/2015 0847   BILITOT 0.8 12/19/2014 1122       RADIOGRAPHIC STUDIES: Ct Chest W Contrast  03/02/2015  CLINICAL DATA:  Restaging left upper lobe lung cancer. Osseous metastatic disease. Radiation therapy completed December 2016. EXAM: CT CHEST, ABDOMEN, AND PELVIS WITH CONTRAST TECHNIQUE: Multidetector CT imaging of the chest, abdomen and pelvis was performed following the standard protocol during bolus administration of intravenous contrast. CONTRAST:  129m OMNIPAQUE IOHEXOL 300 MG/ML  SOLN COMPARISON:  Multiple exams, including 11/03/2014 FINDINGS: CT CHEST FINDINGS Mediastinum/Nodes: Indistinct mediastinal stranding is again observed  with scattered mediastinal lymph nodes which are generally improved from 11/03/2014. For example, 1 reasonably sharply defined right paratracheal lymph node measures 0.7 cm in short axis on image 17 of series 2, and was previously 1.0 cm in short axis. A right hilar lymph node measures 1.0 cm in short axis, previously difficult to measure but slightly larger subjectively. These lymph nodes for previously hypermetabolic. A previously 1.7 cm left supraclavicular lymph node currently measures 1.0 cm on image 6 of series 2. Compared to the prior MRI from  12/03/2014, there has been interval subxiphoid pericardial window with drainage of the large pericardial effusion. There is only trace pericardial accentuation currently. Lungs/Pleura: Trace left pleural effusion. The dominant left upper lobe mass is dramatically smaller, with a peripheral spiculated component currently measuring 1.1 by 1.3 cm on image 15 of series 4 and previously about 3.2 by 2.7 cm. The extensive peribronchovascular airspace opacity in the left upper lobe has nearly resolved, with only mild residual airway thickening and some vague reticulonodular opacities elsewhere in the left upper lobe. There are some new scattered ground-glass opacities and faint airspace opacities in both lungs but most notably in the left lower lobe. There is accentuated airway thickening on the right side, with some associated airway plugging. A 0.9 by 0.6 cm right upper lobe nodule probably represents plugging of a region of bronchiectasis in the right upper lobe on image 17 of series 4 but merits observation. Reticulonodular opacities in the right middle lobe favor atypical infectious bronchiolitis. There is a stable 6 mm left lower lobe pulmonary nodule just above the left hemidiaphragm. Musculoskeletal: Mild thoracic spondylosis. Previously hypermetabolic focus in the left lateral sixth rib is almost undetectable 1 today' s exam, with only very vague sclerosis in this vicinity on image 37 series 4. No new bony lesions identified. CT ABDOMEN PELVIS FINDINGS Hepatobiliary: Early portal venous phase images again demonstrate a variety of hepatic lesions as on 10/13/2014 and 12/10/2014. Enhancement characteristics fairly similar to 10/13/2014 although with some accentuated enhancement likely related to the slightly later phase of contrast no appreciable enlargement from 12/10/2014. On the delayed images, the lesions appear isodense or slightly hyperdense to the hepatic parenchyma. Faint dependent density in the gallbladder  on image 60 series 2 is suspicious for small gallstone. Pancreas: Unremarkable Spleen: Unremarkable Adrenals/Urinary Tract: Unremarkable Stomach/Bowel: Unremarkable Vascular/Lymphatic: Unremarkable Reproductive: Mildly prominent prostate gland, 5.2 by 3.5 cm. Other: No supplemental non-categorized findings. Musculoskeletal: No significant change in the metastatic lesion anteriorly in the right femoral neck which has a lucent center and marginal sclerosis with slight overlying cortical thickening. Bridging spurring of the left sacroiliac joint. Faint sclerosis in the right acetabulum corresponding to prior metastatic lesion. IMPRESSION: 1. The left upper lobe mass and the mediastinal adenopathy are significantly improved. 2. Bilateral increased airway thickening diffusely, possibly a response to radiation therapy, with some new scattered patchy ground-glass opacities and reticulonodular opacities in both lungs which are likely inflammatory, but merit observation. 3. Essentially stable CT appearance of the metastatic lesions in the left sixth rib and right proximal femur as well as the right acetabulum. No new bony lesions are identified. 4. Scattered liver lesions are probably benign based on the lack of prior FDG activity, differential diagnostic considerations favor hemangiomas or focal nodular hyperplasia. These are similar in size to the prior MRI. 5. Other imaging findings of potential clinical significance: Suspected cholelithiasis. Mildly prominent prostate gland. Bridging spurring of the left sacroiliac joint. Mild thoracic spondylosis. Electronically Signed   By:  Van Clines M.D.   On: 03/02/2015 09:22   Ct Abdomen Pelvis W Contrast  03/02/2015  CLINICAL DATA:  Restaging left upper lobe lung cancer. Osseous metastatic disease. Radiation therapy completed December 2016. EXAM: CT CHEST, ABDOMEN, AND PELVIS WITH CONTRAST TECHNIQUE: Multidetector CT imaging of the chest, abdomen and pelvis was  performed following the standard protocol during bolus administration of intravenous contrast. CONTRAST:  121m OMNIPAQUE IOHEXOL 300 MG/ML  SOLN COMPARISON:  Multiple exams, including 11/03/2014 FINDINGS: CT CHEST FINDINGS Mediastinum/Nodes: Indistinct mediastinal stranding is again observed with scattered mediastinal lymph nodes which are generally improved from 11/03/2014. For example, 1 reasonably sharply defined right paratracheal lymph node measures 0.7 cm in short axis on image 17 of series 2, and was previously 1.0 cm in short axis. A right hilar lymph node measures 1.0 cm in short axis, previously difficult to measure but slightly larger subjectively. These lymph nodes for previously hypermetabolic. A previously 1.7 cm left supraclavicular lymph node currently measures 1.0 cm on image 6 of series 2. Compared to the prior MRI from 12/03/2014, there has been interval subxiphoid pericardial window with drainage of the large pericardial effusion. There is only trace pericardial accentuation currently. Lungs/Pleura: Trace left pleural effusion. The dominant left upper lobe mass is dramatically smaller, with a peripheral spiculated component currently measuring 1.1 by 1.3 cm on image 15 of series 4 and previously about 3.2 by 2.7 cm. The extensive peribronchovascular airspace opacity in the left upper lobe has nearly resolved, with only mild residual airway thickening and some vague reticulonodular opacities elsewhere in the left upper lobe. There are some new scattered ground-glass opacities and faint airspace opacities in both lungs but most notably in the left lower lobe. There is accentuated airway thickening on the right side, with some associated airway plugging. A 0.9 by 0.6 cm right upper lobe nodule probably represents plugging of a region of bronchiectasis in the right upper lobe on image 17 of series 4 but merits observation. Reticulonodular opacities in the right middle lobe favor atypical infectious  bronchiolitis. There is a stable 6 mm left lower lobe pulmonary nodule just above the left hemidiaphragm. Musculoskeletal: Mild thoracic spondylosis. Previously hypermetabolic focus in the left lateral sixth rib is almost undetectable 1 today' s exam, with only very vague sclerosis in this vicinity on image 37 series 4. No new bony lesions identified. CT ABDOMEN PELVIS FINDINGS Hepatobiliary: Early portal venous phase images again demonstrate a variety of hepatic lesions as on 10/13/2014 and 12/10/2014. Enhancement characteristics fairly similar to 10/13/2014 although with some accentuated enhancement likely related to the slightly later phase of contrast no appreciable enlargement from 12/10/2014. On the delayed images, the lesions appear isodense or slightly hyperdense to the hepatic parenchyma. Faint dependent density in the gallbladder on image 60 series 2 is suspicious for small gallstone. Pancreas: Unremarkable Spleen: Unremarkable Adrenals/Urinary Tract: Unremarkable Stomach/Bowel: Unremarkable Vascular/Lymphatic: Unremarkable Reproductive: Mildly prominent prostate gland, 5.2 by 3.5 cm. Other: No supplemental non-categorized findings. Musculoskeletal: No significant change in the metastatic lesion anteriorly in the right femoral neck which has a lucent center and marginal sclerosis with slight overlying cortical thickening. Bridging spurring of the left sacroiliac joint. Faint sclerosis in the right acetabulum corresponding to prior metastatic lesion. IMPRESSION: 1. The left upper lobe mass and the mediastinal adenopathy are significantly improved. 2. Bilateral increased airway thickening diffusely, possibly a response to radiation therapy, with some new scattered patchy ground-glass opacities and reticulonodular opacities in both lungs which are likely inflammatory, but merit observation.  3. Essentially stable CT appearance of the metastatic lesions in the left sixth rib and right proximal femur as well as  the right acetabulum. No new bony lesions are identified. 4. Scattered liver lesions are probably benign based on the lack of prior FDG activity, differential diagnostic considerations favor hemangiomas or focal nodular hyperplasia. These are similar in size to the prior MRI. 5. Other imaging findings of potential clinical significance: Suspected cholelithiasis. Mildly prominent prostate gland. Bridging spurring of the left sacroiliac joint. Mild thoracic spondylosis. Electronically Signed   By: Van Clines M.D.   On: 03/02/2015 09:22    ASSESSMENT AND PLAN: This is a very pleasant 50 years old Hispanic male recently diagnosed with metastatic non-small cell lung cancer, adenocarcinoma with positive EGFR mutation in exon 21. He is currently undergoing treatment with oral Tarceva 150 mg by mouth daily status post 4 months and tolerating his treatment well except for skin rash mainly on the face and few episodes of diarrhea. The recent CT scan of the chest, abdomen and pelvis showed significant improvement of his disease. I discussed the scan results with the patient and his wife. I recommended for the patient to continue his current treatment with Tarceva with the same dose. I would see him back for follow-up visit in 4 weeks for evaluation with repeat blood work.  For the dry cough, I recommended for the patient to take [facility] as well as Dylsem if needed. I also gave him prescription for Hycodan 5 ML by mouth every 6 hours as needed. He was advised to call immediately if he has any concerning symptoms in the interval. The patient voices understanding of current disease status and treatment options and is in agreement with the current care plan.  All questions were answered. The patient knows to call the clinic with any problems, questions or concerns. We can certainly see the patient much sooner if necessary.  Disclaimer: This note was dictated with voice recognition software. Similar  sounding words can inadvertently be transcribed and may not be corrected upon review.

## 2015-03-09 NOTE — Telephone Encounter (Signed)
Talked with and scheduled this patient’s appointment(s) while patient was here in our office.       [facility]. °

## 2015-03-19 ENCOUNTER — Telehealth: Payer: Self-pay | Admitting: *Deleted

## 2015-03-19 ENCOUNTER — Other Ambulatory Visit (HOSPITAL_BASED_OUTPATIENT_CLINIC_OR_DEPARTMENT_OTHER): Payer: Self-pay

## 2015-03-19 ENCOUNTER — Encounter: Payer: Self-pay | Admitting: *Deleted

## 2015-03-19 ENCOUNTER — Ambulatory Visit (HOSPITAL_BASED_OUTPATIENT_CLINIC_OR_DEPARTMENT_OTHER): Payer: Self-pay | Admitting: Nurse Practitioner

## 2015-03-19 ENCOUNTER — Telehealth: Payer: Self-pay | Admitting: Internal Medicine

## 2015-03-19 DIAGNOSIS — E8809 Other disorders of plasma-protein metabolism, not elsewhere classified: Secondary | ICD-10-CM

## 2015-03-19 DIAGNOSIS — C3412 Malignant neoplasm of upper lobe, left bronchus or lung: Secondary | ICD-10-CM

## 2015-03-19 DIAGNOSIS — E46 Unspecified protein-calorie malnutrition: Secondary | ICD-10-CM

## 2015-03-19 DIAGNOSIS — R21 Rash and other nonspecific skin eruption: Secondary | ICD-10-CM

## 2015-03-19 DIAGNOSIS — J40 Bronchitis, not specified as acute or chronic: Secondary | ICD-10-CM

## 2015-03-19 DIAGNOSIS — R05 Cough: Secondary | ICD-10-CM

## 2015-03-19 LAB — COMPREHENSIVE METABOLIC PANEL
ALBUMIN: 3 g/dL — AB (ref 3.5–5.0)
ALK PHOS: 90 U/L (ref 40–150)
ALT: 19 U/L (ref 0–55)
AST: 14 U/L (ref 5–34)
Anion Gap: 9 mEq/L (ref 3–11)
BUN: 9.8 mg/dL (ref 7.0–26.0)
CHLORIDE: 104 meq/L (ref 98–109)
CO2: 28 mEq/L (ref 22–29)
Calcium: 9.6 mg/dL (ref 8.4–10.4)
Creatinine: 0.9 mg/dL (ref 0.7–1.3)
EGFR: >90 mL/min/{1.73_m2} (ref >90)
GLUCOSE: 208 mg/dL — AB (ref 70–140)
POTASSIUM: 4.5 meq/L (ref 3.5–5.1)
Sodium: 141 mEq/L (ref 136–145)
TOTAL PROTEIN: 7 g/dL (ref 6.4–8.3)
Total Bilirubin: 0.71 mg/dL (ref 0.20–1.20)

## 2015-03-19 LAB — CBC WITH DIFFERENTIAL/PLATELET
BASO%: 0.4 % (ref 0.0–2.0)
Basophils Absolute: 0 10*3/uL (ref 0.0–0.1)
EOS%: 1.6 % (ref 0.0–7.0)
Eosinophils Absolute: 0.1 10*3/uL (ref 0.0–0.5)
HEMATOCRIT: 40.4 % (ref 38.4–49.9)
HEMOGLOBIN: 13 g/dL (ref 13.0–17.1)
LYMPH#: 0.6 10*3/uL — AB (ref 0.9–3.3)
LYMPH%: 9 % — ABNORMAL LOW (ref 14.0–49.0)
MCH: 27.4 pg (ref 27.2–33.4)
MCHC: 32.1 g/dL (ref 32.0–36.0)
MCV: 85.4 fL (ref 79.3–98.0)
MONO#: 0.4 10*3/uL (ref 0.1–0.9)
MONO%: 6.4 % (ref 0.0–14.0)
NEUT#: 5.8 10*3/uL (ref 1.5–6.5)
NEUT%: 82.6 % — AB (ref 39.0–75.0)
PLATELETS: 378 10*3/uL (ref 140–400)
RBC: 4.73 10*6/uL (ref 4.20–5.82)
RDW: 15.1 % — ABNORMAL HIGH (ref 11.0–14.6)
WBC: 7 10*3/uL (ref 4.0–10.3)

## 2015-03-19 MED ORDER — LEVOFLOXACIN 500 MG PO TABS: 500.0000 mg | ORAL_TABLET | Freq: Every day | ORAL | Status: DC

## 2015-03-19 NOTE — Telephone Encounter (Signed)
Labs/ov added per 02/03 POF, pt is aware... KJ

## 2015-03-19 NOTE — Telephone Encounter (Signed)
Wife called- states patient's cough is worse- no relief with cough syrup. C/o "back and chest pain, no energy and stays cold all the time". No fever that they are aware of- no thermometer at home.

## 2015-03-19 NOTE — Telephone Encounter (Signed)
Spoke with wife, instructed to come in for labs and see Selena Lesser, NP

## 2015-03-21 ENCOUNTER — Encounter: Payer: Self-pay | Admitting: Nurse Practitioner

## 2015-03-21 DIAGNOSIS — J40 Bronchitis, not specified as acute or chronic: Secondary | ICD-10-CM | POA: Insufficient documentation

## 2015-03-21 NOTE — Assessment & Plan Note (Signed)
Patient continues with Tarceva oral therapy as directed.  Patient reports progressive, slightly productive cough for the past week or so.  See notes for further details.  Blood counts obtained today were essentially normal.  Patient is scheduled to return on 04/09/2015 for labs and a follow-up visit.

## 2015-03-21 NOTE — Assessment & Plan Note (Signed)
Patient reports a progressive, chronic cough.  He states it is slightly productive with some occasional yellow secretions.  He denies any recent fever; but states that he remains cold most of the time.  Patient states that he has been taking Hycodan cough syrup with only minimal effectiveness.  He used all of the [employer]; but states that they were fairly ineffective as well.  Restaging CT of the chest/abdomen/pelvis that was obtained on a 03/02/2015 revealed improvement in patient's lung cancer.  Patient denies any chest pain, chest pressure, shortness of breath, or pain with inspiration.  Breath sounds were clear bilaterally.  On exam today.  Patient observed with no shortness of breath and O2 sat was 98% on room air.  Patient was afebrile as well.  Will prescribe Levaquin antibiotics for any questionable bronchitis.  Advised patient that he may very well be experiencing some radiation pneumonitis symptoms from previous radiation treatments.  Advised patient would call him back this coming Wednesday.  Eighth 2017 to see how he is doing.  If patient is not improving with the antibiotics.-We'll consider a Medrol Dosepak to see if this helps.  In the meantime-patient was advised to call/return of electrical to the emergency department for worsening symptoms whatsoever.

## 2015-03-21 NOTE — Assessment & Plan Note (Signed)
Albumen remains slightly decreased at 3.0.  Patient was encouraged to push protein in his diet is much as possible.

## 2015-03-21 NOTE — Progress Notes (Signed)
SYMPTOM MANAGEMENT CLINIC   HPI: Steven Roberts 50 y.o. male diagnosed with lung cancer; with bone metastasis.  Currently undergoing Tarceva oral therapy.   Patient reports a progressive, chronic cough.  He states it is slightly productive with some occasional yellow secretions.  He denies any recent fever; but states that he remains cold most of the time.  Patient states that he has been taking Hycodan cough syrup with only minimal effectiveness.  He used all of the [employer]; but states that they were fairly ineffective as well.  Restaging CT of the chest/abdomen/pelvis that was obtained on a 03/02/2015 revealed improvement in patient's lung cancer.  Patient denies any chest pain, chest pressure, shortness of breath, or pain with inspiration.  Breath sounds were clear bilaterally.  On exam today.  Patient observed with no shortness of breath and O2 sat was 98% on room air.  Patient was afebrile as well.  Will prescribe Levaquin antibiotics for any questionable bronchitis.  Advised patient that he may very well be experiencing some radiation pneumonitis symptoms from previous radiation treatments.  Advised patient would call him back this coming Wednesday.  Eighth 2017 to see how he is doing.  If patient is not improving with the antibiotics.-We'll consider a Medrol Dosepak to see if this helps.  In the meantime-patient was advised to call/return of electrical to the emergency department for worsening symptoms whatsoever.  HPI  Review of Systems  Constitutional: Negative for fever and chills.  Respiratory: Positive for cough and sputum production. Negative for hemoptysis, shortness of breath and wheezing.   Skin: Positive for rash. Negative for itching.  All other systems reviewed and are negative.   Past Medical History  Diagnosis Date  . Bone cancer ([place]) 11/03/14 Pet scan    left 6th rib, right hip  . Lung cancer ([place]) 10/23/14    LUL adenocarcinoma, non-small cell  .  Pericardial effusion with cardiac tamponade 12/11/2014  . Pulmonary infiltrates  c/w Adenoca/ Stage IV  10/13/2014    - symptom onset march 2016 - Baseline cxr on July 20 2014 reported to be nl  > then CT chest 10/13/2014 LUL as dz  Assoc with   LUL nodular density/ mild adenopathy - Quantiferon Gold 10/13/2014 > neg  - FOB 10/23/14 > cobblestoning beyond Lingular orifice with diffuse narrowing x 50% of LUL beyond lingula> adenoca - PET 11/03/14 1. The dominant left upper lobe mass and extensive adenopathy involving th  . Non-small cell carcinoma of lung, stage 4 ([place]) 11/12/2014  . Hypoalbuminemia due to protein-calorie malnutrition ([place]) 12/02/2014  . Hyponatremia 12/10/2014  . Hyperphosphatemia 12/02/2014  . Bone metastasis ([place]) 11/25/2014  . S/P radiation therapy 12/07/14-01/18/15    left lung/lateral rib /rt hip  . Diabetes mellitus without complication ([place])   . Encounter for antineoplastic chemotherapy 03/09/2015    Past Surgical History  Procedure Laterality Date  . Video bronchoscopy Bilateral 10/23/2014    Procedure: VIDEO BRONCHOSCOPY WITHOUT FLUORO;  Surgeon: Tanda Rockers, MD;  Location: [facility];  Service: Cardiopulmonary;  Laterality: Bilateral;  . Subxyphoid pericardial window N/A 12/11/2014    Procedure: SUBXYPHOID PERICARDIAL WINDOW;  Surgeon: Rexene Alberts, MD;  Location: [facility];  Service: Thoracic;  Laterality: N/A;  . Tee without cardioversion N/A 12/11/2014    Procedure: TRANSESOPHAGEAL ECHOCARDIOGRAM (TEE);  Surgeon: Rexene Alberts, MD;  Location: [facility];  Service: Thoracic;  Laterality: N/A;    has Pulmonary infiltrates  c/w Adenoca/ Stage IV ; Primary cancer of left  upper lobe of lung ([place]); Bone metastasis ([place]); Hyperglycemia; Rash; Hypoalbuminemia due to protein-calorie malnutrition ([place]); Pain in the abdomen; Abnormal transaminases; Anasarca; Leukocytosis; Hyponatremia; Pericardial effusion with cardiac tamponade; Diabetes mellitus type 2 in nonobese [facility]);  Hypokalemia; Encounter for antineoplastic chemotherapy; and Bronchitis on his problem list.    is allergic to hydrocodone-acetaminophen.    Medication List       This list is accurate as of: 03/19/15 11:59 PM.  Always use your most recent med list.               acetaminophen 500 MG tablet  Commonly known as:  TYLENOL  Take 500 mg by mouth every 6 (six) hours as needed. Reported on 03/09/2015     ADVIL 200 MG tablet  Generic drug:  ibuprofen  Take 400 mg by mouth every 6 (six) hours as needed. Reported on 03/09/2015     benzonatate 200 MG capsule  Commonly known as:  TESSALON  Take 1 capsule (200 mg total) by mouth 3 (three) times daily as needed for cough.     glimepiride 2 MG tablet  Commonly known as:  AMARYL  Take 1 tablet (2 mg total) by mouth daily with breakfast.     hyaluronate sodium Gel  Apply 1 application topically daily. Reported on 03/09/2015     HYDROcodone-homatropine 5-1.5 MG/5ML syrup  Commonly known as:  HYCODAN  Take 5 mLs by mouth every 6 (six) hours as needed for cough.     levofloxacin 500 MG tablet  Commonly known as:  LEVAQUIN  Take 1 tablet (500 mg total) by mouth daily.     ROBITUSSIN CHEST CONGESTION 100 MG/5ML syrup  Generic drug:  guaifenesin  Take 200 mg by mouth 3 (three) times daily as needed for cough. Reported on 03/09/2015         PHYSICAL EXAMINATION  Oncology Vitals 03/09/2015 02/19/2015  Height 173 cm 173 cm  Weight 82.01 kg 83.87 kg  Weight (lbs) 180 lbs 13 oz 184 lbs 14 oz  BMI (kg/m2) 27.49 kg/m2 28.11 kg/m2  Temp 98.3 97.3  Pulse 70 76  Resp 18 20  SpO2 100 100  BSA (m2) 1.98 m2 2.01 m2   BP Readings from Last 2 Encounters:  03/09/15 111/65  02/19/15 115/63    Physical Exam  Constitutional: He is oriented to person, place, and time and well-developed, well-nourished, and in no distress.  HENT:  Head: Normocephalic and atraumatic.  Mouth/Throat: Oropharynx is clear and moist.  Eyes: Conjunctivae and EOM are  normal. Pupils are equal, round, and reactive to light. Right eye exhibits no discharge. Left eye exhibits no discharge. No scleral icterus.  Neck: Normal range of motion. Neck supple. No JVD present. No tracheal deviation present. No thyromegaly present.  Cardiovascular: Normal rate, regular rhythm, normal heart sounds and intact distal pulses.   Pulmonary/Chest: Effort normal and breath sounds normal. No stridor. No respiratory distress. He has no wheezes. He has no rales. He exhibits no tenderness.  Abdominal: Soft. Bowel sounds are normal. He exhibits no distension and no mass. There is no tenderness. There is no rebound and no guarding.  Musculoskeletal: Normal range of motion. He exhibits no edema or tenderness.  Lymphadenopathy:    He has no cervical adenopathy.  Neurological: He is alert and oriented to person, place, and time. Gait normal.  Skin: Skin is warm and dry. Rash noted. No erythema. No pallor.  Psychiatric: Affect normal.    LABORATORY DATA:. Appointment on 03/19/2015  Component  Date Value Ref Range Status  . WBC 03/19/2015 7.0  4.0 - 10.3 10e3/uL Final  . NEUT# 03/19/2015 5.8  1.5 - 6.5 10e3/uL Final  . HGB 03/19/2015 13.0  13.0 - 17.1 g/dL Final  . HCT 03/19/2015 40.4  38.4 - 49.9 % Final  . Platelets 03/19/2015 378  140 - 400 10e3/uL Final  . MCV 03/19/2015 85.4  79.3 - 98.0 fL Final  . MCH 03/19/2015 27.4  27.2 - 33.4 pg Final  . MCHC 03/19/2015 32.1  32.0 - 36.0 g/dL Final  . RBC 03/19/2015 4.73  4.20 - 5.82 10e6/uL Final  . RDW 03/19/2015 15.1* 11.0 - 14.6 % Final  . lymph# 03/19/2015 0.6* 0.9 - 3.3 10e3/uL Final  . MONO# 03/19/2015 0.4  0.1 - 0.9 10e3/uL Final  . Eosinophils Absolute 03/19/2015 0.1  0.0 - 0.5 10e3/uL Final  . Basophils Absolute 03/19/2015 0.0  0.0 - 0.1 10e3/uL Final  . NEUT% 03/19/2015 82.6* 39.0 - 75.0 % Final  . LYMPH% 03/19/2015 9.0* 14.0 - 49.0 % Final  . MONO% 03/19/2015 6.4  0.0 - 14.0 % Final  . EOS% 03/19/2015 1.6  0.0 - 7.0 % Final   . BASO% 03/19/2015 0.4  0.0 - 2.0 % Final  . Sodium 03/19/2015 141  136 - 145 mEq/L Final  . Potassium 03/19/2015 4.5  3.5 - 5.1 mEq/L Final  . Chloride 03/19/2015 104  98 - 109 mEq/L Final  . CO2 03/19/2015 28  22 - 29 mEq/L Final  . Glucose 03/19/2015 208* 70 - 140 mg/dl Final   Glucose reference range is for nonfasting patients. Fasting glucose reference range is 70- 100.  Marland Kitchen BUN 03/19/2015 9.8  7.0 - 26.0 mg/dL Final  . Creatinine 03/19/2015 0.9  0.7 - 1.3 mg/dL Final  . Total Bilirubin 03/19/2015 0.71  0.20 - 1.20 mg/dL Final  . Alkaline Phosphatase 03/19/2015 90  40 - 150 U/L Final  . AST 03/19/2015 14  5 - 34 U/L Final  . ALT 03/19/2015 19  0 - 55 U/L Final  . Total Protein 03/19/2015 7.0  6.4 - 8.3 g/dL Final  . Albumin 03/19/2015 3.0* 3.5 - 5.0 g/dL Final  . Calcium 03/19/2015 9.6  8.4 - 10.4 mg/dL Final  . Anion Gap 03/19/2015 9  3 - 11 mEq/L Final  . EGFR 03/19/2015 >90  >90 ml/min/1.73 m2 Final   eGFR is calculated using the CKD-EPI Creatinine Equation (2009)     RADIOGRAPHIC STUDIES: No results found.  ASSESSMENT/PLAN:    Rash Patient continues with a chronic rash.  Most likely secondary to Tarceva oral therapy.  Patient states that his rash is stable at present.  Primary cancer of left upper lobe of lung [facility]) Patient continues with Tarceva oral therapy as directed.  Patient reports progressive, slightly productive cough for the past week or so.  See notes for further details.  Blood counts obtained today were essentially normal.  Patient is scheduled to return on 04/09/2015 for labs and a follow-up visit.  Hypoalbuminemia due to protein-calorie malnutrition ([place]) Albumen remains slightly decreased at 3.0.  Patient was encouraged to push protein in his diet is much as possible.  Bronchitis Patient reports a progressive, chronic cough.  He states it is slightly productive with some occasional yellow secretions.  He denies any recent fever; but states that he  remains cold most of the time.  Patient states that he has been taking Hycodan cough syrup with only minimal effectiveness.  He used all of the  [employer]; but states that they were fairly ineffective as well.  Restaging CT of the chest/abdomen/pelvis that was obtained on a 03/02/2015 revealed improvement in patient's lung cancer.  Patient denies any chest pain, chest pressure, shortness of breath, or pain with inspiration.  Breath sounds were clear bilaterally.  On exam today.  Patient observed with no shortness of breath and O2 sat was 98% on room air.  Patient was afebrile as well.  Will prescribe Levaquin antibiotics for any questionable bronchitis.  Advised patient that he may very well be experiencing some radiation pneumonitis symptoms from previous radiation treatments.  Advised patient would call him back this coming Wednesday.  Eighth 2017 to see how he is doing.  If patient is not improving with the antibiotics.-We'll consider a Medrol Dosepak to see if this helps.  In the meantime-patient was advised to call/return of electrical to the emergency department for worsening symptoms whatsoever.  Patient stated understanding of all instructions; and was in agreement with this plan of care. The patient knows to call the clinic with any problems, questions or concerns.   Review/collaboration with Dr. Julien Nordmann regarding all aspects of patient's visit today.   Total time spent with patient was 25 minutes;  with greater than 75 percent of that time spent in face to face counseling regarding patient's symptoms,  and coordination of care and follow up.  Disclaimer:This dictation was prepared with Dragon/digital dictation along with [employer]. Any transcriptional errors that result from this process are unintentional.  Drue Second, NP 03/21/2015

## 2015-03-21 NOTE — Assessment & Plan Note (Signed)
Patient continues with a chronic rash.  Most likely secondary to Tarceva oral therapy.  Patient states that his rash is stable at present.

## 2015-03-24 ENCOUNTER — Telehealth: Payer: Self-pay | Admitting: *Deleted

## 2015-03-24 MED ORDER — METHYLPREDNISOLONE 4 MG PO TBPK: ORAL_TABLET | ORAL | Status: DC

## 2015-03-24 NOTE — Telephone Encounter (Signed)
Pt states the cough is still persistent. His back and lungs continue to ache. He would like the medrol dosepak called into his pharmacy. Pt will follow up with [place] if the steroid does not help.

## 2015-03-24 NOTE — Telephone Encounter (Signed)
Orders placed for Medrol Dosepak per Selena Lesser, FNP

## 2015-03-26 ENCOUNTER — Telehealth: Payer: Self-pay

## 2015-03-26 MED ORDER — PREDNISONE 5 MG PO TABS: ORAL_TABLET | ORAL | Status: DC

## 2015-03-26 NOTE — Telephone Encounter (Signed)
Called to follow up with patient, seen in [facility] on 03/19/15 with cough. Patient states he started the medrol dose pack yesterday and seems to be helping the cough a little bit. Informed Dr. Julien Nordmann who would like to continue patient on '10mg'$  of prednisone after medrol dose pack is finished until patient is seen on feb 24th. Spoke with Mrs. Jacelyn Grip and informed her, she verbalized all understanding and prescription was sent to the pharmacy. She will start him on it on Feb 14th until Feb 24th, total of 10 days. She understands to call if patient gets worse or no better.

## 2015-03-30 ENCOUNTER — Ambulatory Visit (HOSPITAL_BASED_OUTPATIENT_CLINIC_OR_DEPARTMENT_OTHER): Payer: Self-pay | Admitting: Nurse Practitioner

## 2015-03-30 ENCOUNTER — Telehealth: Payer: Self-pay | Admitting: *Deleted

## 2015-03-30 ENCOUNTER — Other Ambulatory Visit: Payer: Self-pay

## 2015-03-30 ENCOUNTER — Ambulatory Visit (HOSPITAL_COMMUNITY)
Admission: RE | Admit: 2015-03-30 | Discharge: 2015-03-30 | Disposition: A | Discharge status: Home / Self Care | Payer: MEDICAID | Source: Ambulatory Visit | Attending: Nurse Practitioner | Admitting: Nurse Practitioner

## 2015-03-30 ENCOUNTER — Encounter (HOSPITAL_COMMUNITY): Payer: Self-pay

## 2015-03-30 ENCOUNTER — Telehealth: Payer: Self-pay | Admitting: Internal Medicine

## 2015-03-30 ENCOUNTER — Encounter: Payer: Self-pay | Admitting: Nurse Practitioner

## 2015-03-30 ENCOUNTER — Telehealth: Payer: Self-pay | Admitting: Nurse Practitioner

## 2015-03-30 ENCOUNTER — Encounter: Payer: Self-pay | Admitting: *Deleted

## 2015-03-30 VITALS — BP 121/55 | HR 66 | Temp 98.6°F | Resp 18 | Ht 68.0 in | Wt 183.3 lb

## 2015-03-30 DIAGNOSIS — C7951 Secondary malignant neoplasm of bone: Secondary | ICD-10-CM

## 2015-03-30 DIAGNOSIS — R918 Other nonspecific abnormal finding of lung field: Secondary | ICD-10-CM | POA: Insufficient documentation

## 2015-03-30 DIAGNOSIS — C3490 Malignant neoplasm of unspecified part of unspecified bronchus or lung: Secondary | ICD-10-CM | POA: Insufficient documentation

## 2015-03-30 DIAGNOSIS — R0781 Pleurodynia: Secondary | ICD-10-CM

## 2015-03-30 DIAGNOSIS — Z5111 Encounter for antineoplastic chemotherapy: Secondary | ICD-10-CM

## 2015-03-30 DIAGNOSIS — R05 Cough: Secondary | ICD-10-CM

## 2015-03-30 DIAGNOSIS — J40 Bronchitis, not specified as acute or chronic: Secondary | ICD-10-CM

## 2015-03-30 DIAGNOSIS — C3412 Malignant neoplasm of upper lobe, left bronchus or lung: Secondary | ICD-10-CM

## 2015-03-30 DIAGNOSIS — R059 Cough, unspecified: Secondary | ICD-10-CM

## 2015-03-30 MED ORDER — HYDROCODONE-ACETAMINOPHEN 5-325 MG PO TABS: 1.0000 | ORAL_TABLET | Freq: Four times a day (QID) | ORAL | Status: DC | PRN

## 2015-03-30 MED ORDER — IOHEXOL 350 MG/ML SOLN
100.0000 mL | Freq: Once | INTRAVENOUS | Status: AC | PRN
  Administered 2015-03-30: 100 mL via INTRAVENOUS

## 2015-03-30 MED ORDER — HYDROCODONE-HOMATROPINE 5-1.5 MG/5ML PO SYRP: 5.0000 mL | ORAL_SOLUTION | Freq: Four times a day (QID) | ORAL | Status: DC | PRN

## 2015-03-30 NOTE — Assessment & Plan Note (Signed)
Patient reports a progressive, chronic cough.  He states it is slightly productive with some occasional yellow secretions.  He denies any recent fever; but states that he remains cold most of the time.  Patient states that he has been taking Hycodan cough syrup with only minimal effectiveness.  He used all of the [employer]; but states that they were fairly ineffective as well.  Restaging CT of the chest/abdomen/pelvis that was obtained on a 03/02/2015 revealed improvement in patient's lung cancer.  Patient has completed a round of Levaquin antibiotics with no improvement in symptoms.  Patient was prescribed a Medrol Dosepak to see if that would help.  Within a few days of initiating the Medrol Dosepak-patient's symptoms had slightly improved; so patient was advised to finish the Medrol Dosepak and then to start taking prednisone 10 mg on a daily basis until he meets with Dr. Julien Nordmann again on 04/13/2015.  Patient presents back to the [facility] today to report that his chronic cough and chronic chest wall discomfort continues.  He has been taking the Hycodan cough syrup with minimal effectiveness as well.  Patient denies any hemoptysis, chest pressure, shortness of breath, or pain with inspiration.  CT angiogram of the chest obtained today revealed no pulmonary embolism.  It did show a questionable mild progression of disease only.  Breath sounds were clear bilaterally.  On exam today.  Patient observed with no shortness of breath and O2 sat was 98% on room air.  Patient was afebrile as well.  CT results reviewed with Dr. Julien Nordmann in detail.  He is advised the patient continue with the prednisone 10 mg on a daily basis until he is seen again by Dr. Julien Nordmann on 3/28 2017.  Also, discussed the options of refilling the Hycodan cough syrup versus Tussionex cough syrup.  Both patient and his wife stated that both of the cough syrups were too expensive for them; and the patient is agreeable to try  taking the Vicodin tablets instead to see if they help.  Advised that the patient may try the Vicodin 1-2 tablets every 6 hours as needed.  He was advised not to drink or drive while taking the pain medication.  If the Vicodin tablets are ineffective; patient may need to consider returning to either the Hycodan or moving up to the Tussionex cough syrup.  EKG obtained today revealed a normal sinus rhythm with a rate of 73 and a QTC of 423.  Differential diagnosis for continued, chronic cough and chest wall discomfort includes lung cancer diagnosis and known bone metastasis pain.  Patient is scheduled to return for labs and a follow-up visit with Dr. Julien Nordmann 04/13/2015.

## 2015-03-30 NOTE — Telephone Encounter (Signed)
per pof to sch pt appt after CT-pt aware

## 2015-03-30 NOTE — Telephone Encounter (Signed)
Per Selena Lesser, FNP CTA chest was ordered stat for pt. Scheduled for today. Pt wife advised. Pt is on the way now. POF entered to add to [facility] after CTA.

## 2015-03-30 NOTE — Telephone Encounter (Signed)
Wife called stating the patient's cough is about the same but his pain is worse.  She states it is in the middle of his chest.   Recommended that they go to the ED.  Wife stated patient did not want to do that and they were told to call [employer]. Discussed with Selena Lesser NP and she conferred with Dr. Julien Nordmann.  They will be ordering an angio-CT and her nurse Clarise Cruz will contact the patient.

## 2015-03-30 NOTE — Assessment & Plan Note (Addendum)
Patient continues with Tarceva oral therapy as directed.  Patient reports progressive, slightly productive cough for the past week or so.  See notes for further details.  Patient is scheduled to return on 04/13/2015 for labs and a follow-up visit.

## 2015-03-30 NOTE — Telephone Encounter (Signed)
per pof to sch pt appt-gave pt copy of avs °

## 2015-03-30 NOTE — Progress Notes (Signed)
 SYMPTOM MANAGEMENT CLINIC   HPI: Steven Roberts 49 y.o. male diagnosed with lung cancer with bone metastasis.  Currently undergoing Tarceva oral therapy. Patient reports a progressive, chronic cough.  He states it is slightly productive with some occasional yellow secretions.  He denies any recent fever; but states that he remains cold most of the time.  Patient states that he has been taking Hycodan cough syrup with only minimal effectiveness.  He used all of the Tess[employer] Perles; but states that they were fairly ineffective as well.  Restaging CT of the chest/abdomen/pelvis that was obtained on a 03/02/2015 revealed improvement in patient's lung cancer.  Patient has completed a round of Levaquin antibiotics with no improvement in symptoms.  Patient was prescribed a Medrol Dosepak to see if that would help.  Within a few days of initiating the Medrol Dosepak-patient's symptoms had slightly improved; so patient was advised to finish the Medrol Dosepak and then to start taking prednisone 10 mg on a daily basis until he meets with Dr. Mohamed again on 04/13/2015.  Patient presents back to the cancer Center today to report that his chronic cough and chronic chest wall discomfort continues.  He has been taking the Hycodan cough syrup with minimal effectiveness as well.  Patient denies any hemoptysis, chest pressure, shortness of breath, or pain with inspiration.  CT angiogram of the chest obtained today revealed no pulmonary embolism.  It did show a questionable mild progression of disease only.  Breath sounds were clear bilaterally.  On exam today.  Patient observed with no shortness of breath and O2 sat was 98% on room air.  Patient was afebrile as well.  CT results reviewed with Dr. Mohamed in detail.  He is advised the patient continue with the prednisone 10 mg on a daily basis until he is seen again by Dr. Mohamed on 3/28 2017.  Also, discussed the options of refilling the Hycodan cough syrup  versus Tussionex cough syrup.  Both patient and his wife stated that both of the cough syrups were too expensive for them; and the patient is agreeable to try taking the Vicodin tablets instead to see if they help.  Advised that the patient may try the Vicodin 1-2 tablets every 6 hours as needed.  He was advised not to drink or drive while taking the pain medication.  If the Vicodin tablets are ineffective; patient may need to consider returning to either the Hycodan or moving up to the Tussionex cough syrup.  EKG obtained today revealed a normal sinus rhythm with a rate of 73 and a QTC of 423.  Differential diagnosis for continued, chronic cough and chest wall discomfort includes lung cancer diagnosis and known bone metastasis pain.  Patient is scheduled to return for labs and a follow-up visit with Dr. Mohamed 04/13/2015.    HPI  Review of Systems  Constitutional: Positive for chills and malaise/fatigue. Negative for fever.  Respiratory: Positive for cough and sputum production. Negative for hemoptysis, shortness of breath and wheezing.        Chest wall pain.    Past Medical History  Diagnosis Date  . Bone cancer (HCC) 11/03/14 Pet scan    left 6th rib, right hip  . Lung cancer (HCC) 10/23/14    LUL adenocarcinoma, non-small cell  . Pericardial effusion with cardiac tamponade 12/11/2014  . Pulmonary infiltrates  c/w Adenoca/ Stage IV  10/13/2014    - symptom onset march 2016 - Baseline cxr on July 20 2014 reported to be   nl  > then CT chest 10/13/2014 LUL as dz  Assoc with   LUL nodular density/ mild adenopathy - Quantiferon Gold 10/13/2014 > neg  - FOB 10/23/14 > cobblestoning beyond Lingular orifice with diffuse narrowing x 50% of LUL beyond lingula> adenoca - PET 11/03/14 1. The dominant left upper lobe mass and extensive adenopathy involving th  . Non-small cell carcinoma of lung, stage 4 (HCC) 11/12/2014  . Hypoalbuminemia due to protein-calorie malnutrition (HCC) 12/02/2014  . Hyponatremia  12/10/2014  . Hyperphosphatemia 12/02/2014  . Bone metastasis (HCC) 11/25/2014  . S/P radiation therapy 12/07/14-01/18/15    left lung/lateral rib /rt hip  . Diabetes mellitus without complication (HCC)   . Encounter for antineoplastic chemotherapy 03/09/2015    Past Surgical History  Procedure Laterality Date  . Video bronchoscopy Bilateral 10/23/2014    Procedure: VIDEO BRONCHOSCOPY WITHOUT FLUORO;  Surgeon: Michael B Wert, MD;  Location: [facility];  Service: Cardiopulmonary;  Laterality: Bilateral;  . Subxyphoid pericardial window N/A 12/11/2014    Procedure: SUBXYPHOID PERICARDIAL WINDOW;  Surgeon: Clarence H Owen, MD;  Location: [facility] OR;  Service: Thoracic;  Laterality: N/A;  . Tee without cardioversion N/A 12/11/2014    Procedure: TRANSESOPHAGEAL ECHOCARDIOGRAM (TEE);  Surgeon: Clarence H Owen, MD;  Location: [facility] OR;  Service: Thoracic;  Laterality: N/A;    has Pulmonary infiltrates  c/w Adenoca/ Stage IV ; Primary cancer of left upper lobe of lung (HCC); Bone metastasis (HCC); Hyperglycemia; Rash; Hypoalbuminemia due to protein-calorie malnutrition (HCC); Pain in the abdomen; Abnormal transaminases; Anasarca; Leukocytosis; Hyponatremia; Pericardial effusion with cardiac tamponade; Diabetes mellitus type 2 in nonobese (HCC); Hypokalemia; Encounter for antineoplastic chemotherapy; and Bronchitis on his problem list.    is allergic to hydrocodone-acetaminophen.    Medication List       This list is accurate as of: 03/30/15  4:26 PM.  Always use your most recent med list.               acetaminophen 500 MG tablet  Commonly known as:  TYLENOL  Take 500 mg by mouth every 6 (six) hours as needed. Reported on 03/09/2015     ADVIL 200 MG tablet  Generic drug:  ibuprofen  Take 400 mg by mouth every 6 (six) hours as needed. Reported on 03/09/2015     benzonatate 200 MG capsule  Commonly known as:  TESSALON  Take 1 capsule (200 mg total) by mouth 3 (three) times daily as needed for  cough.     glimepiride 2 MG tablet  Commonly known as:  AMARYL  Take 1 tablet (2 mg total) by mouth daily with breakfast.     hyaluronate sodium Gel  Apply 1 application topically daily. Reported on 03/09/2015     HYDROcodone-acetaminophen 5-325 MG tablet  Commonly known as:  NORCO/VICODIN  Take 1-2 tablets by mouth every 6 (six) hours as needed for moderate pain.     predniSONE 5 MG tablet  Commonly known as:  DELTASONE  Take 2 tablets (10mg total) by mouth with breakfast starting 03/30/15     ROBITUSSIN CHEST CONGESTION 100 MG/5ML syrup  Generic drug:  guaifenesin  Take 200 mg by mouth 3 (three) times daily as needed for cough. Reported on 03/09/2015         PHYSICAL EXAMINATION  Oncology Vitals 03/30/2015 03/09/2015  Height 173 cm 173 cm  Weight 83.144 kg 82.01 kg  Weight (lbs) 183 lbs 5 oz 180 lbs 13 oz  BMI (kg/m2) 27.87 kg/m2 27.49 kg/m2  Temp 98.6   98.3  Pulse 66 70  Resp 18 18  SpO2 99 100  BSA (m2) 2 m2 1.98 m2   BP Readings from Last 2 Encounters:  03/30/15 121/55  03/09/15 111/65    Physical Exam  Constitutional: He is oriented to person, place, and time and well-developed, well-nourished, and in no distress.  HENT:  Head: Normocephalic and atraumatic.  Mouth/Throat: Oropharynx is clear and moist.  Eyes: Conjunctivae and EOM are normal. Pupils are equal, round, and reactive to light. Right eye exhibits no discharge. Left eye exhibits no discharge. No scleral icterus.  Neck: Normal range of motion. Neck supple. No JVD present. No tracheal deviation present. No thyromegaly present.  Pulmonary/Chest: Effort normal. No respiratory distress. He has no wheezes. He has no rales. He exhibits no tenderness.  Abdominal: Soft. Bowel sounds are normal. He exhibits no distension and no mass. There is no tenderness. There is no rebound and no guarding.  Musculoskeletal: Normal range of motion. He exhibits no edema or tenderness.  Lymphadenopathy:    He has no cervical  adenopathy.  Neurological: He is alert and oriented to person, place, and time. Gait normal.  Skin: Skin is warm and dry. No rash noted. No erythema. No pallor.  Psychiatric: Affect normal.    LABORATORY DATA:. No visits with results within 3 Day(s) from this visit. Latest known visit with results is:  Appointment on 03/19/2015  Component Date Value Ref Range Status  . WBC 03/19/2015 7.0  4.0 - 10.3 10e3/uL Final  . NEUT# 03/19/2015 5.8  1.5 - 6.5 10e3/uL Final  . HGB 03/19/2015 13.0  13.0 - 17.1 g/dL Final  . HCT 03/19/2015 40.4  38.4 - 49.9 % Final  . Platelets 03/19/2015 378  140 - 400 10e3/uL Final  . MCV 03/19/2015 85.4  79.3 - 98.0 fL Final  . MCH 03/19/2015 27.4  27.2 - 33.4 pg Final  . MCHC 03/19/2015 32.1  32.0 - 36.0 g/dL Final  . RBC 03/19/2015 4.73  4.20 - 5.82 10e6/uL Final  . RDW 03/19/2015 15.1* 11.0 - 14.6 % Final  . lymph# 03/19/2015 0.6* 0.9 - 3.3 10e3/uL Final  . MONO# 03/19/2015 0.4  0.1 - 0.9 10e3/uL Final  . Eosinophils Absolute 03/19/2015 0.1  0.0 - 0.5 10e3/uL Final  . Basophils Absolute 03/19/2015 0.0  0.0 - 0.1 10e3/uL Final  . NEUT% 03/19/2015 82.6* 39.0 - 75.0 % Final  . LYMPH% 03/19/2015 9.0* 14.0 - 49.0 % Final  . MONO% 03/19/2015 6.4  0.0 - 14.0 % Final  . EOS% 03/19/2015 1.6  0.0 - 7.0 % Final  . BASO% 03/19/2015 0.4  0.0 - 2.0 % Final  . Sodium 03/19/2015 141  136 - 145 mEq/L Final  . Potassium 03/19/2015 4.5  3.5 - 5.1 mEq/L Final  . Chloride 03/19/2015 104  98 - 109 mEq/L Final  . CO2 03/19/2015 28  22 - 29 mEq/L Final  . Glucose 03/19/2015 208* 70 - 140 mg/dl Final   Glucose reference range is for nonfasting patients. Fasting glucose reference range is 70- 100.  Marland Kitchen BUN 03/19/2015 9.8  7.0 - 26.0 mg/dL Final  . Creatinine 03/19/2015 0.9  0.7 - 1.3 mg/dL Final  . Total Bilirubin 03/19/2015 0.71  0.20 - 1.20 mg/dL Final  . Alkaline Phosphatase 03/19/2015 90  40 - 150 U/L Final  . AST 03/19/2015 14  5 - 34 U/L Final  . ALT 03/19/2015 19  0 - 55  U/L Final  . Total Protein 03/19/2015 7.0  6.4 - 8.3 g/dL Final  . Albumin 03/19/2015 3.0* 3.5 - 5.0 g/dL Final  . Calcium 03/19/2015 9.6  8.4 - 10.4 mg/dL Final  . Anion Gap 03/19/2015 9  3 - 11 mEq/L Final  . EGFR 03/19/2015 >90  >90 ml/min/1.73 m2 Final   eGFR is calculated using the CKD-EPI Creatinine Equation (2009)     RADIOGRAPHIC STUDIES: Ct Angio Chest Pe W/cm &/or Wo Cm  03/30/2015  CLINICAL DATA:  Lung carcinoma with chest pain and shortness of Breath EXAM: CT ANGIOGRAPHY CHEST WITH CONTRAST TECHNIQUE: Multidetector CT imaging of the chest was performed using the standard protocol during bolus administration of intravenous contrast. Multiplanar CT image reconstructions and MIPs were obtained to evaluate the vascular anatomy. CONTRAST:  161m OMNIPAQUE IOHEXOL 350 MG/ML SOLN COMPARISON:  Chest CT March 02, 2015 FINDINGS: Mediastinum/Lymph Nodes: There is no demonstrable pulmonary embolus. There is no thoracic aortic aneurysm or dissection. The visualized great vessels appear normal. The pericardium is not appreciably thickened. Visualized thyroid appears normal. Multiple areas of adenopathy are again noted. The largest individual lymph node is in the right hilar region, measuring 2.1 x 1.4 cm. There is a sub- carinal lymph node measuring 1.8 x 1.5 cm. There is a right peritracheal lymph node measuring 1.5 x 1.0 cm. Overall, the current lymph nodes appear slightly larger than on recent prior study. No new lymph node enlargement is seen, however. Lungs/Pleura: The irregular nodular lesion in the anterior segment of the left upper lobe with mild central cavitation is again noted, measuring 1.3 x 1.1 cm. There is patchy surrounding pneumonitis in the region of this mass in the left upper lobe. Compared to the previous study, there is now extensive airspace consolidation throughout much of the left lower lobe, primarily involving the superior segment as well as portions of the anterior, lateral,  and posterior segments as well as in the inferior lingula peripherally. There is ill-defined opacity, likely pneumonia, in the posterior segment of the left upper lobe medially adjacent to the proximal descending thoracic aorta. There is also a new area of irregular opacity in the superior segment of the right lower lobe measuring 1.5 x 0.9 cm. There is irregular nodularity causing peribronchial thickening in the periphery of the posterior segment right upper lobe bronchi, stable. There is a new 4 mm nodular opacity in the anterior segment of the right upper lobe seen on axial slice 53 series 7. There is a new nodular opacity in the medial segment of the right lower lobe measuring 7 x 8 mm. Upper abdomen: In the visualized upper abdomen, no lesions are identified. Visualized adrenals in particular appear unremarkable. Musculoskeletal: No blastic or lytic bone lesions are apparent currently. Review of the MIP images confirms the above findings. IMPRESSION: No demonstrable pulmonary embolus. Slight increase in lymph node prominence at several sites. No new enlarged lymph nodes identified. Extensive airspace consolidation throughout much of the left lower lobe and inferior lingula. Patchy infiltrate elsewhere as noted above. Stable nodularity along the right upper lobe peripheral bronchi, concerning for neoplastic foci. New small nodular opacities, most notable in the medial segment of the right lower lobe. Electronically Signed   By: WLowella GripIII M.D.   On: 03/30/2015 12:31    ASSESSMENT/PLAN:    Primary cancer of left upper lobe of lung ([facility] Patient continues with Tarceva oral therapy as directed.  Patient reports progressive, slightly productive cough for the past week or so.  See notes for further details.  Patient is  scheduled to return on 04/13/2015 for labs and a follow-up visit.       Bronchitis Patient reports a progressive, chronic cough.  He states it is slightly productive with  some occasional yellow secretions.  He denies any recent fever; but states that he remains cold most of the time.  Patient states that he has been taking Hycodan cough syrup with only minimal effectiveness.  He used all of the [employer]; but states that they were fairly ineffective as well.  Restaging CT of the chest/abdomen/pelvis that was obtained on a 03/02/2015 revealed improvement in patient's lung cancer.  Patient has completed a round of Levaquin antibiotics with no improvement in symptoms.  Patient was prescribed a Medrol Dosepak to see if that would help.  Within a few days of initiating the Medrol Dosepak-patient's symptoms had slightly improved; so patient was advised to finish the Medrol Dosepak and then to start taking prednisone 10 mg on a daily basis until he meets with Dr. Julien Nordmann again on 04/13/2015.  Patient presents back to the [place] today to report that his chronic cough and chronic chest wall discomfort continues.  He has been taking the Hycodan cough syrup with minimal effectiveness as well.  Patient denies any hemoptysis, chest pressure, shortness of breath, or pain with inspiration.  CT angiogram of the chest obtained today revealed no pulmonary embolism.  It did show a questionable mild progression of disease only.  Breath sounds were clear bilaterally.  On exam today.  Patient observed with no shortness of breath and O2 sat was 98% on room air.  Patient was afebrile as well.  CT results reviewed with Dr. Julien Nordmann in detail.  He is advised the patient continue with the prednisone 10 mg on a daily basis until he is seen again by Dr. Julien Nordmann on 3/28 2017.  Also, discussed the options of refilling the Hycodan cough syrup versus Tussionex cough syrup.  Both patient and his wife stated that both of the cough syrups were too expensive for them; and the patient is agreeable to try taking the Vicodin tablets instead to see if they help.  Advised that the patient may try  the Vicodin 1-2 tablets every 6 hours as needed.  He was advised not to drink or drive while taking the pain medication.  If the Vicodin tablets are ineffective; patient may need to consider returning to either the Hycodan or moving up to the Tussionex cough syrup.  EKG obtained today revealed a normal sinus rhythm with a rate of 73 and a QTC of 423.  Differential diagnosis for continued, chronic cough and chest wall discomfort includes lung cancer diagnosis and known bone metastasis pain.  Patient is scheduled to return for labs and a follow-up visit with Dr. Julien Nordmann 04/13/2015.  Patient stated understanding of all instructions; and was in agreement with this plan of care. The patient knows to call the clinic with any problems, questions or concerns.   Review/collaboration with Dr. Julien Nordmann regarding all aspects of patient's visit today.   Total time spent with patient was 40 minutes;  with greater than 75 percent of that time spent in face to face counseling regarding patient's symptoms,  and coordination of care and follow up.  Disclaimer:This dictation was prepared with Dragon/digital dictation along with [employer]. Any transcriptional errors that result from this process are unintentional.  Drue Second, NP 03/30/2015

## 2015-03-30 NOTE — Progress Notes (Signed)
Addendum:   EKGELISA, SORLIE GP:[id number] 30-Mar-2015 14:58:57 [facility] ROUTINE RECORD Normal sinus rhythm Cannot rule out Anterior infarct , age undetermined Abnormal ECG 62m/s 12mmV '100Hz'$  8.0 SP2 12SL 237 CID: [id number] Referred by: Unconfirmed Vent. rate 75 BPM PR interval 158 ms QRS duration 92 ms QT/QTc 382/426 ms P-R-T axes 78 60 76 [id number]) Male Other

## 2015-04-09 ENCOUNTER — Ambulatory Visit: Payer: Medicaid Other | Admitting: Internal Medicine

## 2015-04-09 ENCOUNTER — Other Ambulatory Visit: Payer: Medicaid Other

## 2015-04-13 ENCOUNTER — Other Ambulatory Visit (HOSPITAL_BASED_OUTPATIENT_CLINIC_OR_DEPARTMENT_OTHER): Payer: Self-pay

## 2015-04-13 ENCOUNTER — Telehealth: Payer: Self-pay | Admitting: Internal Medicine

## 2015-04-13 ENCOUNTER — Encounter: Payer: Self-pay | Admitting: Internal Medicine

## 2015-04-13 ENCOUNTER — Ambulatory Visit (HOSPITAL_BASED_OUTPATIENT_CLINIC_OR_DEPARTMENT_OTHER): Payer: Self-pay | Admitting: Internal Medicine

## 2015-04-13 VITALS — BP 117/64 | HR 91 | Temp 98.2°F | Resp 18 | Ht 68.0 in | Wt 174.0 lb

## 2015-04-13 DIAGNOSIS — C7951 Secondary malignant neoplasm of bone: Secondary | ICD-10-CM

## 2015-04-13 DIAGNOSIS — E46 Unspecified protein-calorie malnutrition: Secondary | ICD-10-CM

## 2015-04-13 DIAGNOSIS — C3412 Malignant neoplasm of upper lobe, left bronchus or lung: Secondary | ICD-10-CM

## 2015-04-13 DIAGNOSIS — Z5111 Encounter for antineoplastic chemotherapy: Secondary | ICD-10-CM

## 2015-04-13 LAB — COMPREHENSIVE METABOLIC PANEL
ALBUMIN: 2.9 g/dL — AB (ref 3.5–5.0)
ALK PHOS: 151 U/L — AB (ref 40–150)
ALT: 37 U/L (ref 0–55)
AST: 18 U/L (ref 5–34)
Anion Gap: 8 mEq/L (ref 3–11)
BUN: 8.6 mg/dL (ref 7.0–26.0)
CO2: 27 mEq/L (ref 22–29)
Calcium: 9.5 mg/dL (ref 8.4–10.4)
Chloride: 102 mEq/L (ref 98–109)
Creatinine: 0.8 mg/dL (ref 0.7–1.3)
GLUCOSE: 153 mg/dL — AB (ref 70–140)
Potassium: 4.2 mEq/L (ref 3.5–5.1)
SODIUM: 137 meq/L (ref 136–145)
TOTAL PROTEIN: 6.8 g/dL (ref 6.4–8.3)
Total Bilirubin: 0.76 mg/dL (ref 0.20–1.20)

## 2015-04-13 LAB — CBC WITH DIFFERENTIAL/PLATELET
BASO%: 0.5 % (ref 0.0–2.0)
BASOS ABS: 0 10*3/uL (ref 0.0–0.1)
EOS ABS: 0.4 10*3/uL (ref 0.0–0.5)
EOS%: 5.3 % (ref 0.0–7.0)
HCT: 39.3 % (ref 38.4–49.9)
HGB: 12.8 g/dL — ABNORMAL LOW (ref 13.0–17.1)
LYMPH%: 5.8 % — AB (ref 14.0–49.0)
MCH: 27.9 pg (ref 27.2–33.4)
MCHC: 32.7 g/dL (ref 32.0–36.0)
MCV: 85.3 fL (ref 79.3–98.0)
MONO#: 0.5 10*3/uL (ref 0.1–0.9)
MONO%: 6.7 % (ref 0.0–14.0)
NEUT%: 81.7 % — AB (ref 39.0–75.0)
NEUTROS ABS: 6.4 10*3/uL (ref 1.5–6.5)
PLATELETS: 336 10*3/uL (ref 140–400)
RBC: 4.6 10*6/uL (ref 4.20–5.82)
RDW: 14.1 % (ref 11.0–14.6)
WBC: 7.9 10*3/uL (ref 4.0–10.3)
lymph#: 0.5 10*3/uL — ABNORMAL LOW (ref 0.9–3.3)

## 2015-04-13 MED ORDER — HYDROCODONE-ACETAMINOPHEN 5-325 MG PO TABS: 1.0000 | ORAL_TABLET | Freq: Four times a day (QID) | ORAL | Status: DC | PRN

## 2015-04-13 MED ORDER — HYDROCODONE-HOMATROPINE 5-1.5 MG/5ML PO SYRP: 5.0000 mL | ORAL_SOLUTION | Freq: Four times a day (QID) | ORAL | Status: DC | PRN

## 2015-04-13 MED ORDER — METHYLPREDNISOLONE 4 MG PO TBPK: ORAL_TABLET | ORAL | Status: DC

## 2015-04-13 MED ORDER — FENTANYL 25 MCG/HR TD PT72: 25.0000 ug | MEDICATED_PATCH | TRANSDERMAL | Status: DC

## 2015-04-13 MED FILL — HYDROCODON-APAP 5-325: 5-325 | 3 days supply | Qty: 30 | Fill #0

## 2015-04-13 MED FILL — fentaNYL 25 MCG/HR PT72: 25 | 30 days supply | Qty: 10 | Fill #0

## 2015-04-13 MED FILL — HYDROCODONE-HOMATROPINE SYR: 5-1.5 | 6 days supply | Qty: 120 | Fill #0

## 2015-04-13 NOTE — Telephone Encounter (Signed)
appt made and avs printed °

## 2015-04-13 NOTE — Progress Notes (Signed)
[place] Telephone:(336) [phone]   Fax:(336) [phone]  OFFICE PROGRESS NOTE  Pcp Not In System No address on file  DIAGNOSIS: Stage IV (T1b, N3, M1b) non-small cell lung cancer, adenocarcinoma with positive EGFR mutation in exon 21 ([id number]) presented with left upper lobe lung mass in addition to bilateral pulmonary nodules and mediastinal as well as supraclavicular lymphadenopathy and metastatic bone lesions diagnosed in September 2016.   PRIOR THERAPY:  1) status post subxiphoid pericardial window for cardiac tamponade as well as a right chest tube placement for drainage of right pleural effusion under the care of Dr. Roxy Manns. 2) Palliative radiotherapy to the left lung/left lateral rib as well as a right hip under the care of Dr. Lisbeth Renshaw completed on 01/18/2015  CURRENT THERAPY: Tarceva 150 mg by mouth daily. Status post 5 months of treatment.  INTERVAL HISTORY: Steven Roberts 50 y.o. male returns to the clinic today for follow-up visit accompanied by his wife. The patient is tolerating his treatment was Tarceva 150 mg by mouth daily fairly well with no significant adverse effects. He denied having any significant diarrhea but has mild skin rash. He continues to have dry cough with pain at the right lower rib cage. He tried several cough medications with no improvement. He also continues to have weight loss. He has shortness breath with exertion. He denied having any hemoptysis. He denied having any significant nausea or vomiting. He has no fever or chills. Repeat CT angiogram of the chest 2 weeks ago showed no evidence for pulmonary embolism or disease progression.  MEDICAL HISTORY: Past Medical History  Diagnosis Date  . Bone cancer ([place]) 11/03/14 Pet scan    left 6th rib, right hip  . Lung cancer ([place]) 10/23/14    LUL adenocarcinoma, non-small cell  . Pericardial effusion with cardiac tamponade 12/11/2014  . Pulmonary infiltrates  c/w Adenoca/ Stage IV  10/13/2014    -  symptom onset march 2016 - Baseline cxr on July 20 2014 reported to be nl  > then CT chest 10/13/2014 LUL as dz  Assoc with   LUL nodular density/ mild adenopathy - Quantiferon Gold 10/13/2014 > neg  - FOB 10/23/14 > cobblestoning beyond Lingular orifice with diffuse narrowing x 50% of LUL beyond lingula> adenoca - PET 11/03/14 1. The dominant left upper lobe mass and extensive adenopathy involving th  . Non-small cell carcinoma of lung, stage 4 ([place]) 11/12/2014  . Hypoalbuminemia due to protein-calorie malnutrition ([place]) 12/02/2014  . Hyponatremia 12/10/2014  . Hyperphosphatemia 12/02/2014  . Bone metastasis ([place]) 11/25/2014  . S/P radiation therapy 12/07/14-01/18/15    left lung/lateral rib /rt hip  . Diabetes mellitus without complication ([place])   . Encounter for antineoplastic chemotherapy 03/09/2015    ALLERGIES:  is allergic to hydrocodone-acetaminophen.  MEDICATIONS:  Current Outpatient Prescriptions  Medication Sig Dispense Refill  . acetaminophen (TYLENOL) 500 MG tablet Take 500 mg by mouth every 6 (six) hours as needed. Reported on 03/09/2015    . glimepiride (AMARYL) 2 MG tablet Take 1 tablet (2 mg total) by mouth daily with breakfast. 30 tablet 0  . HYDROcodone-acetaminophen (NORCO/VICODIN) 5-325 MG tablet Take 1-2 tablets by mouth every 6 (six) hours as needed for moderate pain. 30 tablet 0  . ibuprofen (ADVIL) 200 MG tablet Take 400 mg by mouth every 6 (six) hours as needed. Reported on 03/09/2015    . benzonatate (TESSALON) 200 MG capsule Take 1 capsule (200 mg total) by mouth 3 (three) times daily  as needed for cough. (Patient not taking: Reported on 04/13/2015) 40 capsule 1  . guaifenesin (ROBITUSSIN CHEST CONGESTION) 100 MG/5ML syrup Take 200 mg by mouth 3 (three) times daily as needed for cough. Reported on 04/13/2015    . hyaluronate sodium (RADIAPLEXRX) GEL Apply 1 application topically daily. Reported on 04/13/2015     No current facility-administered medications for this visit.     SURGICAL HISTORY:  Past Surgical History  Procedure Laterality Date  . Video bronchoscopy Bilateral 10/23/2014    Procedure: VIDEO BRONCHOSCOPY WITHOUT FLUORO;  Surgeon: Tanda Rockers, MD;  Location: [facility];  Service: Cardiopulmonary;  Laterality: Bilateral;  . Subxyphoid pericardial window N/A 12/11/2014    Procedure: SUBXYPHOID PERICARDIAL WINDOW;  Surgeon: Rexene Alberts, MD;  Location: [facility];  Service: Thoracic;  Laterality: N/A;  . Tee without cardioversion N/A 12/11/2014    Procedure: TRANSESOPHAGEAL ECHOCARDIOGRAM (TEE);  Surgeon: Rexene Alberts, MD;  Location: [facility];  Service: Thoracic;  Laterality: N/A;    REVIEW OF SYSTEMS:  Constitutional: positive for fatigue and weight loss Eyes: negative Ears, nose, mouth, throat, and face: negative Respiratory: positive for cough, dyspnea on exertion and pleurisy/chest pain Cardiovascular: negative Gastrointestinal: negative Genitourinary:negative Integument/breast: negative Hematologic/lymphatic: negative Musculoskeletal:negative Neurological: negative Behavioral/Psych: negative Endocrine: negative Allergic/Immunologic: negative   PHYSICAL EXAMINATION: General appearance: alert, cooperative, fatigued and no distress Head: Normocephalic, without obvious abnormality, atraumatic Neck: no adenopathy, no JVD, supple, symmetrical, trachea midline and thyroid not enlarged, symmetric, no tenderness/mass/nodules Lymph nodes: Cervical, supraclavicular, and axillary nodes normal. Resp: clear to auscultation bilaterally Back: symmetric, no curvature. ROM normal. No CVA tenderness. Cardio: regular rate and rhythm, S1, S2 normal, no murmur, click, rub or gallop GI: soft, non-tender; bowel sounds normal; no masses,  no organomegaly Extremities: extremities normal, atraumatic, no cyanosis or edema Neurologic: Alert and oriented X 3, normal strength and tone. Normal symmetric reflexes. Normal coordination and gait  ECOG PERFORMANCE  STATUS: 1 - Symptomatic but completely ambulatory  Blood pressure 117/64, pulse 91, temperature 98.2 F (36.8 C), temperature source Oral, resp. rate 18, height 5' 8"  (1.727 m), weight 174 lb (78.926 kg), SpO2 100 %.  LABORATORY DATA: Lab Results  Component Value Date   WBC 7.9 04/13/2015   HGB 12.8* 04/13/2015   HCT 39.3 04/13/2015   MCV 85.3 04/13/2015   PLT 336 04/13/2015      Chemistry      Component Value Date/Time   NA 137 04/13/2015 0926   NA 138 12/19/2014 0336   K 4.2 04/13/2015 0926   K 3.8 12/19/2014 0336   CL 102 12/19/2014 0336   CO2 27 04/13/2015 0926   CO2 28 12/19/2014 0336   BUN 8.6 04/13/2015 0926   BUN 9 12/19/2014 0336   CREATININE 0.8 04/13/2015 0926   CREATININE 0.56* 12/19/2014 0336      Component Value Date/Time   CALCIUM 9.5 04/13/2015 0926   CALCIUM 8.4* 12/19/2014 0336   ALKPHOS 151* 04/13/2015 0926   ALKPHOS 124 12/19/2014 1122   AST 18 04/13/2015 0926   AST 39 12/19/2014 1122   ALT 37 04/13/2015 0926   ALT 73* 12/19/2014 1122   BILITOT 0.76 04/13/2015 0926   BILITOT 0.8 12/19/2014 1122       RADIOGRAPHIC STUDIES: Ct Angio Chest Pe W/cm &/or Wo Cm  03/30/2015  CLINICAL DATA:  Lung carcinoma with chest pain and shortness of Breath EXAM: CT ANGIOGRAPHY CHEST WITH CONTRAST TECHNIQUE: Multidetector CT imaging of the chest was performed using the standard protocol during  bolus administration of intravenous contrast. Multiplanar CT image reconstructions and MIPs were obtained to evaluate the vascular anatomy. CONTRAST:  136m OMNIPAQUE IOHEXOL 350 MG/ML SOLN COMPARISON:  Chest CT March 02, 2015 FINDINGS: Mediastinum/Lymph Nodes: There is no demonstrable pulmonary embolus. There is no thoracic aortic aneurysm or dissection. The visualized great vessels appear normal. The pericardium is not appreciably thickened. Visualized thyroid appears normal. Multiple areas of adenopathy are again noted. The largest individual lymph node is in the right hilar  region, measuring 2.1 x 1.4 cm. There is a sub- carinal lymph node measuring 1.8 x 1.5 cm. There is a right peritracheal lymph node measuring 1.5 x 1.0 cm. Overall, the current lymph nodes appear slightly larger than on recent prior study. No new lymph node enlargement is seen, however. Lungs/Pleura: The irregular nodular lesion in the anterior segment of the left upper lobe with mild central cavitation is again noted, measuring 1.3 x 1.1 cm. There is patchy surrounding pneumonitis in the region of this mass in the left upper lobe. Compared to the previous study, there is now extensive airspace consolidation throughout much of the left lower lobe, primarily involving the superior segment as well as portions of the anterior, lateral, and posterior segments as well as in the inferior lingula peripherally. There is ill-defined opacity, likely pneumonia, in the posterior segment of the left upper lobe medially adjacent to the proximal descending thoracic aorta. There is also a new area of irregular opacity in the superior segment of the right lower lobe measuring 1.5 x 0.9 cm. There is irregular nodularity causing peribronchial thickening in the periphery of the posterior segment right upper lobe bronchi, stable. There is a new 4 mm nodular opacity in the anterior segment of the right upper lobe seen on axial slice 53 series 7. There is a new nodular opacity in the medial segment of the right lower lobe measuring 7 x 8 mm. Upper abdomen: In the visualized upper abdomen, no lesions are identified. Visualized adrenals in particular appear unremarkable. Musculoskeletal: No blastic or lytic bone lesions are apparent currently. Review of the MIP images confirms the above findings. IMPRESSION: No demonstrable pulmonary embolus. Slight increase in lymph node prominence at several sites. No new enlarged lymph nodes identified. Extensive airspace consolidation throughout much of the left lower lobe and inferior lingula. Patchy  infiltrate elsewhere as noted above. Stable nodularity along the right upper lobe peripheral bronchi, concerning for neoplastic foci. New small nodular opacities, most notable in the medial segment of the right lower lobe. Electronically Signed   By: WLowella GripIII M.D.   On: 03/30/2015 12:31    ASSESSMENT AND PLAN: This is a very pleasant 50years old Hispanic male recently diagnosed with metastatic non-small cell lung cancer, adenocarcinoma with positive EGFR mutation in exon 21. He is currently undergoing treatment with oral Tarceva 150 mg by mouth daily status post 5 months and tolerating his treatment well except for skin rash mainly on the face and few episodes of diarrhea. The recent CT angiogram scan of the chest showed no evidence for disease progression or pulmonary embolism. I discussed the scan results with the patient and his wife. I recommended for the patient to continue his current treatment with Tarceva with the same dose. I would see him back for follow-up visit in 4 weeks for evaluation with repeat blood work.  For the dry cough, I gave the patient prescription for Hycodan 5 ML by mouth every 6 hours.  For pain management, I started  the patient on fentanyl patch 25 g/hour every 3 days and he was given a refill of Vicodin. For the lack of appetite and malnutrition, I started the patient on Medrol Dosepak and referred him to the dietitian at the [facility]. He was advised to call immediately if he has any concerning symptoms in the interval. The patient voices understanding of current disease status and treatment options and is in agreement with the current care plan.  All questions were answered. The patient knows to call the clinic with any problems, questions or concerns. We can certainly see the patient much sooner if necessary.  Disclaimer: This note was dictated with voice recognition software. Similar sounding words can inadvertently be transcribed and may not be  corrected upon review.

## 2015-04-29 ENCOUNTER — Ambulatory Visit: Payer: Self-pay | Admitting: Nutrition

## 2015-04-29 NOTE — Progress Notes (Signed)
50 year old male diagnosed with stage IV non-small cell lung cancer.  He is a patient of Dr. Julien Nordmann.  Past medical history includes pericardial effusion, Hypoalbuminemia and diabetes.  Medications include Tarceva, Amaryl, and Medrol Dosepak.  Labs include glucose 208 and albumin 3.0 on February 4.  Height: 68 inches. Weight: 174.8 pounds. Usual body weight: 185 pound January 2017. BMI: 26.58.  Patient reports he does not feel well today. Reports pain is not well-controlled but he does not always take pain medications for breakthrough pain. Reports constipation with pain medications. He has taste alterations. He complains of increased fatigue. He has occasional nausea.  Nutrition diagnosis: Inadequate oral intake related to stage IV lung cancer as evidenced by approximate10 pound weight loss.  Intervention: Patient educated to consume high-calorie, high-protein foods in 6 small meals and snacks daily. Provided oral nutrition supplements for patient to try. Encouraged patient to consume smoothies twice a day.  Provided recipes sheets for patient's wife. Educated patient on strategies for improving taste alterations and provided fact sheet. Encouraged compliance with pain medications. Recommended patient begin bowel regimen to avoid further constipation. Questions were answered.  Teach back method used.  Contact information provided.  Monitoring, evaluation, goals: Patient will tolerate increased calories and protein to minimize weight loss.  Next visit.  Patient will contact me for questions or concerns.

## 2015-05-06 ENCOUNTER — Other Ambulatory Visit: Payer: Self-pay | Admitting: *Deleted

## 2015-05-06 ENCOUNTER — Telehealth: Payer: Self-pay | Admitting: *Deleted

## 2015-05-06 DIAGNOSIS — C3412 Malignant neoplasm of upper lobe, left bronchus or lung: Secondary | ICD-10-CM

## 2015-05-06 DIAGNOSIS — E46 Unspecified protein-calorie malnutrition: Secondary | ICD-10-CM

## 2015-05-06 DIAGNOSIS — C7951 Secondary malignant neoplasm of bone: Secondary | ICD-10-CM

## 2015-05-06 MED ORDER — HYDROCODONE-ACETAMINOPHEN 5-325 MG PO TABS: 1.0000 | ORAL_TABLET | Freq: Four times a day (QID) | ORAL | Status: DC | PRN

## 2015-05-06 NOTE — Telephone Encounter (Signed)
Called patient. Patient would only like a refill of Norco prescription. Prescription in folder. Informed family member and they will pick up tomorrow.

## 2015-05-06 NOTE — Telephone Encounter (Signed)
I left a message for wife to return my call -need clarification if pt needs refill on fentanyl and does 1 vicodin help with breakthrough.

## 2015-05-06 NOTE — Telephone Encounter (Signed)
This RN received call from [facility] - stating need for refill on pain medication.  Per her discussion with pt's wife- Jeannie Done- pt is on the fentanyl patch with vicodin for breakthrough pain.  Pt is using 1 vicoden due to " if he takes 2 it makes him nauseated "  " not sure if Dr Julien Nordmann would want to change his pain medications because of this "  Overall pain is not controlled well.  Call can be returned to pt's wife- Jeannie Done at [phone] regarding pain medication and when available for prescription pick up.

## 2015-05-07 MED FILL — HYDROCODON-APAP 5-325: 5-325 | 4 days supply | Qty: 30 | Fill #0

## 2015-05-08 ENCOUNTER — Encounter (HOSPITAL_COMMUNITY): Payer: Self-pay

## 2015-05-08 ENCOUNTER — Emergency Department (HOSPITAL_COMMUNITY): Payer: Medicaid Other

## 2015-05-08 ENCOUNTER — Inpatient Hospital Stay (HOSPITAL_COMMUNITY)
Admission: EM | Admit: 2015-05-08 | Discharge: 2015-05-12 | DRG: 180 | Disposition: A | Discharge status: Home / Self Care | Payer: Medicaid Other | Attending: Internal Medicine | Admitting: Internal Medicine

## 2015-05-08 DIAGNOSIS — E119 Type 2 diabetes mellitus without complications: Secondary | ICD-10-CM | POA: Diagnosis present

## 2015-05-08 DIAGNOSIS — Z833 Family history of diabetes mellitus: Secondary | ICD-10-CM | POA: Diagnosis not present

## 2015-05-08 DIAGNOSIS — E46 Unspecified protein-calorie malnutrition: Secondary | ICD-10-CM | POA: Diagnosis present

## 2015-05-08 DIAGNOSIS — C7951 Secondary malignant neoplasm of bone: Secondary | ICD-10-CM | POA: Diagnosis present

## 2015-05-08 DIAGNOSIS — Z885 Allergy status to narcotic agent status: Secondary | ICD-10-CM | POA: Diagnosis not present

## 2015-05-08 DIAGNOSIS — Z8042 Family history of malignant neoplasm of prostate: Secondary | ICD-10-CM | POA: Diagnosis not present

## 2015-05-08 DIAGNOSIS — Z79899 Other long term (current) drug therapy: Secondary | ICD-10-CM | POA: Diagnosis not present

## 2015-05-08 DIAGNOSIS — J948 Other specified pleural conditions: Secondary | ICD-10-CM

## 2015-05-08 DIAGNOSIS — Z923 Personal history of irradiation: Secondary | ICD-10-CM

## 2015-05-08 DIAGNOSIS — J910 Malignant pleural effusion: Secondary | ICD-10-CM | POA: Diagnosis present

## 2015-05-08 DIAGNOSIS — R05 Cough: Secondary | ICD-10-CM | POA: Diagnosis present

## 2015-05-08 DIAGNOSIS — E8809 Other disorders of plasma-protein metabolism, not elsewhere classified: Secondary | ICD-10-CM | POA: Diagnosis present

## 2015-05-08 DIAGNOSIS — C3412 Malignant neoplasm of upper lobe, left bronchus or lung: Secondary | ICD-10-CM | POA: Diagnosis present

## 2015-05-08 DIAGNOSIS — J189 Pneumonia, unspecified organism: Secondary | ICD-10-CM | POA: Diagnosis present

## 2015-05-08 DIAGNOSIS — J90 Pleural effusion, not elsewhere classified: Secondary | ICD-10-CM | POA: Diagnosis present

## 2015-05-08 DIAGNOSIS — R0781 Pleurodynia: Secondary | ICD-10-CM | POA: Diagnosis present

## 2015-05-08 LAB — COMPREHENSIVE METABOLIC PANEL
ALT: 33 U/L (ref 17–63)
ANION GAP: 8 (ref 5–15)
AST: 22 U/L (ref 15–41)
Albumin: 3 g/dL — ABNORMAL LOW (ref 3.5–5.0)
Alkaline Phosphatase: 142 U/L — ABNORMAL HIGH (ref 38–126)
BILIRUBIN TOTAL: 0.5 mg/dL (ref 0.3–1.2)
BUN: 11 mg/dL (ref 6–20)
CHLORIDE: 104 mmol/L (ref 101–111)
CO2: 27 mmol/L (ref 22–32)
Calcium: 9 mg/dL (ref 8.9–10.3)
Creatinine, Ser: 0.64 mg/dL (ref 0.61–1.24)
GFR calc Af Amer: >60 mL/min (ref >60)
Glucose, Bld: 177 mg/dL — ABNORMAL HIGH (ref 65–99)
POTASSIUM: 4 mmol/L (ref 3.5–5.1)
Sodium: 139 mmol/L (ref 135–145)
TOTAL PROTEIN: 6.7 g/dL (ref 6.5–8.1)

## 2015-05-08 LAB — URINALYSIS, ROUTINE W REFLEX MICROSCOPIC
Bilirubin Urine: NEGATIVE
Glucose, UA: NEGATIVE mg/dL
HGB URINE DIPSTICK: NEGATIVE
Ketones, ur: NEGATIVE mg/dL
LEUKOCYTES UA: NEGATIVE
NITRITE: NEGATIVE
PROTEIN: NEGATIVE mg/dL
SPECIFIC GRAVITY, URINE: 1.019 (ref 1.005–1.030)
pH: 7.5 (ref 5.0–8.0)

## 2015-05-08 LAB — CBC WITH DIFFERENTIAL/PLATELET
BASOS ABS: 0 10*3/uL (ref 0.0–0.1)
BASOS PCT: 0 %
EOS PCT: 2 %
Eosinophils Absolute: 0.2 10*3/uL (ref 0.0–0.7)
HEMATOCRIT: 34.8 % — AB (ref 39.0–52.0)
Hemoglobin: 11.6 g/dL — ABNORMAL LOW (ref 13.0–17.0)
LYMPHS PCT: 6 %
Lymphs Abs: 0.6 10*3/uL — ABNORMAL LOW (ref 0.7–4.0)
MCH: 27.6 pg (ref 26.0–34.0)
MCHC: 33.3 g/dL (ref 30.0–36.0)
MCV: 82.7 fL (ref 78.0–100.0)
MONO ABS: 1 10*3/uL (ref 0.1–1.0)
MONOS PCT: 10 %
NEUTROS ABS: 8.3 10*3/uL — AB (ref 1.7–7.7)
Neutrophils Relative %: 82 %
PLATELETS: 470 10*3/uL — AB (ref 150–400)
RBC: 4.21 MIL/uL — ABNORMAL LOW (ref 4.22–5.81)
RDW: 13.7 % (ref 11.5–15.5)
WBC: 10 10*3/uL (ref 4.0–10.5)

## 2015-05-08 LAB — GLUCOSE, CAPILLARY
GLUCOSE-CAPILLARY: 239 mg/dL — AB (ref 65–99)
Glucose-Capillary: 197 mg/dL — ABNORMAL HIGH (ref 65–99)

## 2015-05-08 MED ORDER — HYDROMORPHONE HCL 1 MG/ML IJ SOLN
1.0000 mg | Freq: Once | INTRAMUSCULAR | Status: AC
  Administered 2015-05-08: 1 mg via INTRAVENOUS
  Filled 2015-05-08: qty 1

## 2015-05-08 MED ORDER — VANCOMYCIN HCL IN DEXTROSE 1-5 GM/200ML-% IV SOLN
1000.0000 mg | Freq: Once | INTRAVENOUS | Status: AC
  Administered 2015-05-08: 1000 mg via INTRAVENOUS
  Filled 2015-05-08: qty 200

## 2015-05-08 MED ORDER — IBUPROFEN 200 MG PO TABS
400.0000 mg | ORAL_TABLET | Freq: Four times a day (QID) | ORAL | Status: DC | PRN
Start: 2015-05-08 — End: 2015-05-12
  Administered 2015-05-08 – 2015-05-11 (×8): 400 mg via ORAL
  Filled 2015-05-08 (×9): qty 2

## 2015-05-08 MED ORDER — HYDROCODONE-ACETAMINOPHEN 5-325 MG PO TABS
1.0000 | ORAL_TABLET | Freq: Four times a day (QID) | ORAL | Status: DC | PRN
  Administered 2015-05-09 – 2015-05-10 (×2): 1 via ORAL
  Administered 2015-05-10 – 2015-05-12 (×6): 2 via ORAL
  Filled 2015-05-08 (×5): qty 2
  Filled 2015-05-08: qty 1
  Filled 2015-05-08: qty 2
  Filled 2015-05-08: qty 1
  Filled 2015-05-08 (×2): qty 2

## 2015-05-08 MED ORDER — SODIUM CHLORIDE 0.9 % IV BOLUS (SEPSIS)
1000.0000 mL | Freq: Once | INTRAVENOUS | Status: AC
  Administered 2015-05-08: 1000 mL via INTRAVENOUS

## 2015-05-08 MED ORDER — ERLOTINIB HCL 150 MG PO TABS: 150.0000 mg | ORAL_TABLET | Freq: Every day | ORAL | Status: DC

## 2015-05-08 MED ORDER — PIPERACILLIN-TAZOBACTAM 3.375 G IVPB
3.3750 g | Freq: Three times a day (TID) | INTRAVENOUS | Status: DC
  Administered 2015-05-08 – 2015-05-10 (×5): 3.375 g via INTRAVENOUS
  Filled 2015-05-08 (×6): qty 50

## 2015-05-08 MED ORDER — ENOXAPARIN SODIUM 40 MG/0.4ML ~~LOC~~ SOLN
40.0000 mg | SUBCUTANEOUS | Status: DC
  Administered 2015-05-08 – 2015-05-11 (×4): 40 mg via SUBCUTANEOUS
  Filled 2015-05-08 (×5): qty 0.4

## 2015-05-08 MED ORDER — VANCOMYCIN HCL 10 G IV SOLR
1250.0000 mg | Freq: Two times a day (BID) | INTRAVENOUS | Status: DC
  Administered 2015-05-08 – 2015-05-10 (×4): 1250 mg via INTRAVENOUS
  Filled 2015-05-08: qty 1250
  Filled 2015-05-08: qty 1000
  Filled 2015-05-08 (×2): qty 1250

## 2015-05-08 MED ORDER — INSULIN ASPART 100 UNIT/ML ~~LOC~~ SOLN
0.0000 [IU] | Freq: Every day | SUBCUTANEOUS | Status: DC
  Administered 2015-05-08: 2 [IU] via SUBCUTANEOUS
  Administered 2015-05-11: 3 [IU] via SUBCUTANEOUS

## 2015-05-08 MED ORDER — IOPAMIDOL (ISOVUE-370) INJECTION 76%
100.0000 mL | Freq: Once | INTRAVENOUS | Status: AC | PRN
  Administered 2015-05-08: 100 mL via INTRAVENOUS

## 2015-05-08 MED ORDER — FENTANYL 25 MCG/HR TD PT72
25.0000 ug | MEDICATED_PATCH | TRANSDERMAL | Status: DC
  Administered 2015-05-08 – 2015-05-11 (×2): 25 ug via TRANSDERMAL
  Filled 2015-05-08 (×2): qty 1

## 2015-05-08 MED ORDER — PIPERACILLIN-TAZOBACTAM 3.375 G IVPB 30 MIN
3.3750 g | Freq: Once | INTRAVENOUS | Status: AC
  Administered 2015-05-08: 3.375 g via INTRAVENOUS
  Filled 2015-05-08: qty 50

## 2015-05-08 MED ORDER — INSULIN ASPART 100 UNIT/ML ~~LOC~~ SOLN
0.0000 [IU] | Freq: Three times a day (TID) | SUBCUTANEOUS | Status: DC
  Administered 2015-05-08 – 2015-05-09 (×2): 3 [IU] via SUBCUTANEOUS
  Administered 2015-05-09: 5 [IU] via SUBCUTANEOUS
  Administered 2015-05-09: 11 [IU] via SUBCUTANEOUS
  Administered 2015-05-10: 2 [IU] via SUBCUTANEOUS
  Administered 2015-05-10: 8 [IU] via SUBCUTANEOUS
  Administered 2015-05-10: 3 [IU] via SUBCUTANEOUS
  Administered 2015-05-11: 2 [IU] via SUBCUTANEOUS
  Administered 2015-05-11 – 2015-05-12 (×4): 3 [IU] via SUBCUTANEOUS

## 2015-05-08 MED ORDER — METHYLPREDNISOLONE SODIUM SUCC 40 MG IJ SOLR
40.0000 mg | Freq: Two times a day (BID) | INTRAMUSCULAR | Status: DC
  Administered 2015-05-08 – 2015-05-09 (×4): 40 mg via INTRAVENOUS
  Filled 2015-05-08 (×6): qty 1

## 2015-05-08 NOTE — Progress Notes (Signed)
Pharmacy Antibiotic Note  Steven Roberts is a 50 y.o. male admitted on 05/08/2015 with possible pneumonia.  Pharmacy has been consulted for vancomycin/zosyn dosing. He has lung cancer with mets to bone.  Complains of cough, no fever or leukocytosis at presentation.  CXR compatible with multifocal infection.    Plan:  Vancomycin 1gm x 1 in ED then '1250mg'$  IV q12h  Zosyn 3.375gm IV over 43mn x 1 in ED then 3.375gm IV q8h over 4h infusion  De-escalate antibiotics ASAP per culture results  Monitor renal function and check trough if remains on vanco > 4 days     Temp (24hrs), Avg:97.9 F (36.6 C), Min:97.9 F (36.6 C), Max:97.9 F (36.6 C)   Recent Labs Lab 05/08/15 0909  WBC 10.0  CREATININE 0.64    Estimated Creatinine Clearance: 110.4 mL/min (by C-G formula based on Cr of 0.64).    Allergies  Allergen Reactions  . Hydrocodone-Acetaminophen Nausea Only    Numbness - patient takes this medication and has no issues taking 1 tablet at a time    Antimicrobials this admission: Vancomycin 3/25 >>  Pip/tazo 3/25 >>   Dose adjustments this admission:  Microbiology results: None ordered  Thank you for allowing pharmacy to be a part of this patient's care.  DDoreene Eland PharmD, BCPS.   Pager: [phone]/25/2017 10:43 AM

## 2015-05-08 NOTE — ED Notes (Signed)
Patient transported to CT 

## 2015-05-08 NOTE — ED Provider Notes (Signed)
CSN: [id number]     Arrival date & time 05/08/15  0347 History   First MD Initiated Contact with Patient 05/08/15 [phone]     Chief Complaint  Patient presents with  . cancer   . Flank Pain    HPI   Steven Roberts is a 50 y.o. male with a PMH of lung cancer, bone cancer, DM who presents to the ED with right sided rib pain, which he states he has had since January, though worsened over the past week. He states his pain is constant. He notes cough at baseline, which is unchanged, and reports coughing exacerbates his pain. He has tried his home pain medication (fentanyl patch, norco) for pain with no significant symptom relief. His wife is present at bedside, and reports subjective fever and chills. He denies abdominal pain, nausea, vomiting. He states he finished his radiation for lung cancer in December and has been taking oral chemotherapy since that time. He states he has been seen by his oncologist for his symptoms and had a CT scan in February, which was unchanged from prior, and was started on additional pain medicine.   Past Medical History  Diagnosis Date  . Bone cancer ([place]) 11/03/14 Pet scan    left 6th rib, right hip  . Lung cancer ([place]) 10/23/14    LUL adenocarcinoma, non-small cell  . Pericardial effusion with cardiac tamponade 12/11/2014  . Pulmonary infiltrates  c/w Adenoca/ Stage IV  10/13/2014    - symptom onset march 2016 - Baseline cxr on July 20 2014 reported to be nl  > then CT chest 10/13/2014 LUL as dz  Assoc with   LUL nodular density/ mild adenopathy - Quantiferon Gold 10/13/2014 > neg  - FOB 10/23/14 > cobblestoning beyond Lingular orifice with diffuse narrowing x 50% of LUL beyond lingula> adenoca - PET 11/03/14 1. The dominant left upper lobe mass and extensive adenopathy involving th  . Non-small cell carcinoma of lung, stage 4 ([place]) 11/12/2014  . Hypoalbuminemia due to protein-calorie malnutrition ([place]) 12/02/2014  . Hyponatremia 12/10/2014  . Hyperphosphatemia 12/02/2014  .  Bone metastasis ([place]) 11/25/2014  . S/P radiation therapy 12/07/14-01/18/15    left lung/lateral rib /rt hip  . Diabetes mellitus without complication ([place])   . Encounter for antineoplastic chemotherapy 03/09/2015   Past Surgical History  Procedure Laterality Date  . Video bronchoscopy Bilateral 10/23/2014    Procedure: VIDEO BRONCHOSCOPY WITHOUT FLUORO;  Surgeon: Tanda Rockers, MD;  Location: [facility];  Service: Cardiopulmonary;  Laterality: Bilateral;  . Subxyphoid pericardial window N/A 12/11/2014    Procedure: SUBXYPHOID PERICARDIAL WINDOW;  Surgeon: Rexene Alberts, MD;  Location: [facility];  Service: Thoracic;  Laterality: N/A;  . Tee without cardioversion N/A 12/11/2014    Procedure: TRANSESOPHAGEAL ECHOCARDIOGRAM (TEE);  Surgeon: Rexene Alberts, MD;  Location: [facility] OR;  Service: Thoracic;  Laterality: N/A;   Family History  Problem Relation Age of Onset  . Cancer Father     Prostate cancer  . Diabetes Mother    Social History  Substance Use Topics  . Smoking status: Never Smoker   . Smokeless tobacco: Never Used  . Alcohol Use: No      Review of Systems  Constitutional: Positive for fever and chills.  Respiratory: Positive for cough and shortness of breath.   Cardiovascular: Positive for chest pain (Right sided rib pain).  Gastrointestinal: Negative for nausea, vomiting and abdominal pain.  All other systems reviewed and are negative.     Allergies  Hydrocodone-acetaminophen  Home Medications   Prior to Admission medications   Medication Sig Start Date End Date Taking? Authorizing Provider  erlotinib (TARCEVA) 150 MG tablet Take 150 mg by mouth daily. Take on an empty stomach 1 hour before meals or 2 hours after.   Yes Historical Provider, MD  fentaNYL (DURAGESIC - DOSED MCG/HR) 25 MCG/HR patch Place 1 patch (25 mcg total) onto the skin every 3 (three) days. 04/13/15  Yes Curt Bears, MD  HYDROcodone-acetaminophen (NORCO/VICODIN) 5-325 MG tablet Take 1-2 tablets  by mouth every 6 (six) hours as needed for moderate pain. 05/06/15  Yes Curt Bears, MD  ibuprofen (ADVIL) 200 MG tablet Take 400 mg by mouth every 6 (six) hours as needed for mild pain. Reported on 03/09/2015   Yes Historical Provider, MD  benzonatate (TESSALON) 200 MG capsule Take 1 capsule (200 mg total) by mouth 3 (three) times daily as needed for cough. Patient not taking: Reported on 04/13/2015 01/05/15   Kyung Rudd, MD  glimepiride (AMARYL) 2 MG tablet Take 1 tablet (2 mg total) by mouth daily with breakfast. Patient not taking: Reported on 05/08/2015 12/20/14   Nishant Dhungel, MD  HYDROcodone-homatropine (HYCODAN) 5-1.5 MG/5ML syrup Take 5 mLs by mouth every 6 (six) hours as needed for cough. Patient not taking: Reported on 05/08/2015 04/13/15   Curt Bears, MD  methylPREDNISolone (MEDROL DOSEPAK) 4 MG TBPK tablet Use as instructed Patient not taking: Reported on 05/08/2015 04/13/15   Curt Bears, MD    BP 126/74 mmHg  Pulse 91  Temp(Src) 98.6 F (37 C) (Oral)  Resp 18  SpO2 93% Physical Exam  Constitutional: He is oriented to person, place, and time. He appears well-developed and well-nourished. No distress.  HENT:  Head: Normocephalic and atraumatic.  Right Ear: External ear normal.  Left Ear: External ear normal.  Nose: Nose normal.  Mouth/Throat: Uvula is midline, oropharynx is clear and moist and mucous membranes are normal.  Eyes: Conjunctivae, EOM and lids are normal. Pupils are equal, round, and reactive to light. Right eye exhibits no discharge. Left eye exhibits no discharge. No scleral icterus.  Neck: Normal range of motion. Neck supple.  Cardiovascular: Normal rate, regular rhythm, normal heart sounds, intact distal pulses and normal pulses.   Pulmonary/Chest: Effort normal. No respiratory distress. He has no wheezes. He has no rales. He exhibits tenderness.  Decreased breath sounds bilaterally. Right anterior and lateral chest wall tender to palpation.   Abdominal: Soft. Normal appearance and bowel sounds are normal. He exhibits no distension and no mass. There is no tenderness. There is no rigidity, no rebound and no guarding.  Musculoskeletal: Normal range of motion. He exhibits no edema or tenderness.  Neurological: He is alert and oriented to person, place, and time.  Skin: Skin is warm, dry and intact. No rash noted. He is not diaphoretic. No erythema. No pallor.  Psychiatric: He has a normal mood and affect. His speech is normal and behavior is normal.  Nursing note and vitals reviewed.   ED Course  Procedures (including critical care time)  Labs Review Labs Reviewed  CBC WITH DIFFERENTIAL/PLATELET - Abnormal; Notable for the following:    RBC 4.21 (*)    Hemoglobin 11.6 (*)    HCT 34.8 (*)    Platelets 470 (*)    Neutro Abs 8.3 (*)    Lymphs Abs 0.6 (*)    All other components within normal limits  COMPREHENSIVE METABOLIC PANEL - Abnormal; Notable for the following:    Glucose,  Bld 177 (*)    Albumin 3.0 (*)    Alkaline Phosphatase 142 (*)    All other components within normal limits  URINALYSIS, ROUTINE W REFLEX MICROSCOPIC (NOT AT [facility])    Imaging Review Dg Chest 2 View  05/08/2015  CLINICAL DATA:  50 year old male with a history of right-sided rib pain. EXAM: CHEST - 2 VIEW COMPARISON:  03/30/2015, 03/02/2015, prior chest x-ray 01/04/2015 FINDINGS: Cardiomediastinal silhouette unchanged in size and contour. Over the comparison chest x-ray and CT studies, the patient has had improved in worsening of bilateral airspace/interstitial opacities. The current pattern of disease is worst in the most recent chest x-ray of 01/04/2015, and compared across modalities to the CT 03/30/2015. There are persisted interstitial opacities in the left greater than right mid lung and at the base on the lateral view. Unremarkable appearance of the upper abdomen. IMPRESSION: Multifocal, left greater than right airspace disease, compatible with  multifocal infection. The patient has had waxing and waning disease over the course of the comparison plain film and CT. Followup PA and lateral chest X-ray is recommended in 3-4 weeks following trial of antibiotic therapy to ensure resolution and exclude underlying malignancy. Signed, Dulcy Fanny. Earleen Newport, DO [facility] Electronically Signed   By: Corrie Mckusick D.O.   On: 05/08/2015 09:58   Ct Angio Chest Pe W/cm &/or Wo Cm  05/08/2015  CLINICAL DATA:  50 year old male with right-sided rib pain and cough for the past week or longer. Patient completed left thoracic radiation therapy for metastatic non-small cell lung cancer in December of 2016. Patient also has a history of cardiac tamponade requiring subxiphoid pericardial window and right-sided chest tube placement EXAM: CT ANGIOGRAPHY CHEST WITH CONTRAST TECHNIQUE: Multidetector CT imaging of the chest was performed using the standard protocol during bolus administration of intravenous contrast. Multiplanar CT image reconstructions and MIPs were obtained to evaluate the vascular anatomy. CONTRAST:  100 mL Isovue 370 COMPARISON:  Most recent prior chest CT 03/30/2015 FINDINGS: Mediastinum: Similar appearance of left supraclavicular adenopathy. The largest nodal conglomerate measures approximately 2.8 x 1.8 cm which may be slightly smaller than previously measured at 3.3 x 2.4 cm. No definite mediastinal mass or adenopathy. Unremarkable thoracic esophagus. Heart/Vascular: Adequate opacification of the pulmonary arteries to the proximal subsegmental level. No evidence of acute pulmonary embolus. The heart is normal in size. No pericardial effusion. Normal caliber thoracic aorta. Lungs/Pleura: Moderate layering right-sided pleural effusion is new compared to February of 2017. Significant interval progression of metastatic disease bilaterally. The dominant nodule in the left upper lobe now measures 1.1 x 1.4 cm  compared to 0.9 x 1.2 cm. Additionally, there has been extensive progression of diffuse bronchial wall thickening and tree-in-bud micro and macro nodularity throughout the right upper lung concerning for progressive lymphangitis spread of the disease. There is new focal segmental atelectasis in the posterior aspect of the right middle lobe secondary to bronchial obstruction. Additionally, there is been significant interval progression of consolidation, volume loss and architectural distortion in the left upper and lower lobes in the region of prior radiation therapy. 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 Innumerable small pulmonary nodules throughout the left lower lobe are more prominent and greater in number. Bones/Soft Tissues: Stable blastic metastases in the T1 and T2 vertebral bodies without significant interval change. Upper Abdomen: Stable early enhancing lesion in hepatic segment 3 likely representative of a hemangioma. Relatively circumscribed low-attenuation lesion in the caudate is stable at 1.8 cm. Review of the  MIP images confirms the above findings. IMPRESSION: 1. Negative for acute pulmonary embolus. 2. CT findings suggest significant interval progression of widespread pulmonary metastatic disease with probable diffuse lymphangitic spread of tumor throughout the right upper and middle lobes. Additionally, there is increasing extensive nodularity in the left lower lobe concerning for additional foci of metastatic disease. Enlarging left upper lobe metastatic nodule. 3. Interval reaccumulation of a moderate layering right-sided pleural effusion which is likely malignant. 4. Progressive consolidation, volume loss and architectural distortion in the left upper and lower lobes in the region of prior radiation therapy. 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 5. Stable T1 and T2 blastic osseous metastatic disease. No definite new bony lesion or evidence of pathologic fracture. 6. Slightly  decreased left supraclavicular adenopathy. 7. Stable liver lesions 1 of which is almost certainly a hemangioma. Electronically Signed   By: Jacqulynn Cadet M.D.   On: 05/08/2015 11:04   I have personally reviewed and evaluated these images and lab results as part of my medical decision-making.   EKG Interpretation None      MDM   Final diagnoses:  CAP (community acquired pneumonia)    50 year old male presents with right sided rib pain. Notes subjective fever and cough (which he states is unchanged from baseline). Reports his worsening pain prompted him to come to the ED today. Patient is afebrile. Vital signs stable. Heart RRR. Decreased breath sounds to lung fields bilaterally. TTP to right lateral and anterior chest wall. No palpable deformity. Abdomen soft, non-tender, non-distended.   CBC negative for leukocytosis, hemoglobin 11.6. CMP remarkable for alkaline phosphatase 142. UA negative for infection. CXR with multifocal, left greater than right, airspace disease. Discussed findings with patient. Started on broad spectrum antibiotics (vanc and zosyn). CT PE study obtained given concern for possible PE. Imaging negative for PE, though reveals interval progression of pulmonary metastatic disease with probable lymphangitic spread of tumor; in the appropriate clinical setting, multi-lobar PNA could have similar appearance.  Spoke with Dr. Rockne Menghini, patient to be admitted for further evaluation and management. Patient and family are in agreement with plan. Patient discussed with and seen by Dr. Tyrone Nine.   BP 133/95 mmHg  Pulse 98  Temp(Src) 98.6 F (37 C) (Oral)  Resp 18  SpO2 90%     Marella Chimes, PA-C 05/08/15 [place], DO 05/08/15 1808

## 2015-05-08 NOTE — ED Notes (Signed)
Ambulated pt down hallway. Pt O2 was at 92 before ambulation, and by the time he returned to the room, it had dropped to 86. Pt had no c/o while walking, however stated his right side was hurting once he got back in bed.

## 2015-05-08 NOTE — ED Notes (Signed)
Pt with rt rib pain/cough x more than a week.  Pt finished his radiation for bone cancer in December.  Is taking oral chemo at this time.  States feels hot at home.  Has not checked fever.  Chills on arrival.  No fever at check in.  Pain meds for cancer are not working.  No difficulty urinating.

## 2015-05-08 NOTE — ED Notes (Signed)
Patient aware that urine sample is needed. Urinal at bedside. Encouraged to void when able.

## 2015-05-08 NOTE — ED Notes (Signed)
Patient transported to X-ray 

## 2015-05-08 NOTE — ED Provider Notes (Signed)
Medical screening examination/treatment/procedure(s) were conducted as a shared visit with non-physician practitioner(s) and myself.  I personally evaluated the patient during the encounter.   EKG Interpretation   Date/Time:  Saturday May 08 2015 11:48:07 EDT Ventricular Rate:  87 PR Interval:  155 QRS Duration: 95 QT Interval:  312 QTC Calculation: 375 R Axis:   91 Text Interpretation:  Sinus rhythm Borderline right axis deviation No  significant change since last tracing Confirmed by Jalecia Leon MD, Quillian Quince  [phone]) on 05/08/2015 6:09:13 PM       See the written copy of this report in the patient's paper medical record.  These results did not interface directly into the electronic medical record and are summarized here.  50 yo M with a history of lung cancer comes in with a chief complaint of right-sided rib pain. This been going on for quite some time but worsening over the past week. Also having cough congestion fevers. On my exam patient has no rashes over that area. Has clear lung sounds bilaterally. Looks like he feels uncomfortable. Chest x-ray concerning for multifocal pneumonia. Patient with a history of cancer concern for possible PE. We'll obtain a CT angiogram of the chest. Broad spectrum abx. Admit.  Deno Etienne, DO 05/08/15 [phone]

## 2015-05-08 NOTE — H&P (Signed)
History and Physical:    Steven Roberts   LZJ:[id number] DOB: Oct 24, 1965 DOA: 05/08/2015  Referring MD/provider: Dr. Deno Etienne PCP: Pcp Not In System  Oncologist: Dr. Curt Bears  Chief Complaint: Fever, chills, cough, pleuritic chest pain  History of Present Illness:   Steven Roberts is an 50 y.o. 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 Julien Nordmann, disease complicated by cardiac tamponade s/p subxiphoid window/right pleural effusion s/p right chest tube.  He has received palliative XRT to the left lung/left lateral hip and right hip, which completed 01/18/15.  He is currently receiving Tarceva, And has been on this for the past 6 months. Patient has been seen by the oncology department on a fairly regular basis with complaints of progressive, chronic cough. His cough is mostly dry, but occasionally productive of yellow secretions. Has cold intolerance and chills at times. A restaging CT of his chest, abdomen and pelvis was done 03/02/15 and showed improvement in his lung cancer. He has completed a course of antibiotic therapy with Levaquin and 2 rounds of steroids which have not significantly improved his symptoms, although there was a slight improvement after the first course and he was subsequently maintained on a maintenance dose of prednisone 10 mg daily. The right-sided pleuritic-type chest pain has been increasingly bad over the past week. Upon initial evaluation in the ED, the patient was noted to have findings concerning for multifocal pneumonia on chest radiography. A CT scan was subsequently obtained which was negative for pulmonary embolism, but did show findings with possible significant progression of lung cancer versus interstitial lung disease which can be a side effect of Tarceva. He was noted to drop his oxygen saturations into the mid 80s when ambulating. He is not on home oxygen. Discussed the case with  Dr. Julien Nordmann who recommends discontinuation of Tarceva and who will see him on 05/10/15.  ROS:   Review of Systems  Constitutional: Positive for fever, chills, weight loss and malaise/fatigue.  HENT: Negative for congestion, ear pain and sore throat.   Eyes: Negative.   Respiratory: Positive for cough and shortness of breath. Negative for sputum production.   Cardiovascular: Positive for chest pain.       Right rib / chest pain  Gastrointestinal: Positive for nausea, vomiting and diarrhea. Negative for abdominal pain, blood in stool and melena.  Genitourinary: Negative for dysuria.  Musculoskeletal: Positive for myalgias and back pain.       Right shoulder pain.  Skin: Negative.   Neurological: Positive for weakness.  Endo/Heme/Allergies: Negative.   Psychiatric/Behavioral: Negative.      Past Medical History:   Past Medical History  Diagnosis Date  . Bone cancer ([place]) 11/03/14 Pet scan    left 6th rib, right hip  . Lung cancer ([place]) 10/23/14    LUL adenocarcinoma, non-small cell  . Pericardial effusion with cardiac tamponade 12/11/2014  . Pulmonary infiltrates  c/w Adenoca/ Stage IV  10/13/2014    - symptom onset march 2016 - Baseline cxr on July 20 2014 reported to be nl  > then CT chest 10/13/2014 LUL as dz  Assoc with   LUL nodular density/ mild adenopathy - Quantiferon Gold 10/13/2014 > neg  - FOB 10/23/14 > cobblestoning beyond Lingular orifice with diffuse narrowing x 50% of LUL beyond lingula> adenoca - PET 11/03/14 1. The dominant left upper lobe mass and extensive adenopathy involving th  . Non-small cell carcinoma of  lung, stage 4 ([place]) 11/12/2014  . Hypoalbuminemia due to protein-calorie malnutrition ([place]) 12/02/2014  . Hyponatremia 12/10/2014  . Hyperphosphatemia 12/02/2014  . Bone metastasis ([place]) 11/25/2014  . S/P radiation therapy 12/07/14-01/18/15    left lung/lateral rib /rt hip  . Diabetes mellitus without complication ([place])   . Encounter for antineoplastic chemotherapy  03/09/2015    Past Surgical History:   Past Surgical History  Procedure Laterality Date  . Video bronchoscopy Bilateral 10/23/2014    Procedure: VIDEO BRONCHOSCOPY WITHOUT FLUORO;  Surgeon: Tanda Rockers, MD;  Location: [facility];  Service: Cardiopulmonary;  Laterality: Bilateral;  . Subxyphoid pericardial window N/A 12/11/2014    Procedure: SUBXYPHOID PERICARDIAL WINDOW;  Surgeon: Rexene Alberts, MD;  Location: [facility];  Service: Thoracic;  Laterality: N/A;  . Tee without cardioversion N/A 12/11/2014    Procedure: TRANSESOPHAGEAL ECHOCARDIOGRAM (TEE);  Surgeon: Rexene Alberts, MD;  [facility];  Service: Thoracic;  Laterality: N/A;    Social History:   Social History   Social History  . Marital Status: Married    Spouse Name: N/A  . Number of Children: 1  . Years of Education: N/A   Occupational History  . [employer]    Social History Main Topics  . Smoking status: Never Smoker   . Smokeless tobacco: Never Used  . Alcohol Use: No  . Drug Use: No  . Sexual Activity: Not on file   Other Topics Concern  . Not on file   Social History Narrative   Lives with his wife.  Independent of ADLs.    Family history:   Family History  Problem Relation Age of Onset  . Cancer Father     Prostate cancer  . Diabetes Mother     Allergies   Hydrocodone-acetaminophen  Current Medications:   Prior to Admission medications   Medication Sig Start Date End Date Taking? Authorizing Provider  erlotinib (TARCEVA) 150 MG tablet Take 150 mg by mouth daily. Take on an empty stomach 1 hour before meals or 2 hours after.   Yes Historical Provider, MD  fentaNYL (DURAGESIC - DOSED MCG/HR) 25 MCG/HR patch Place 1 patch (25 mcg total) onto the skin every 3 (three) days. 04/13/15  Yes Curt Bears, MD  HYDROcodone-acetaminophen (NORCO/VICODIN) 5-325 MG tablet Take 1-2 tablets by mouth every 6 (six) hours as needed for moderate pain. 05/06/15  Yes Curt Bears, MD  ibuprofen (ADVIL) 200  MG tablet Take 400 mg by mouth every 6 (six) hours as needed for mild pain. Reported on 03/09/2015   Yes Historical Provider, MD  benzonatate (TESSALON) 200 MG capsule Take 1 capsule (200 mg total) by mouth 3 (three) times daily as needed for cough. Patient not taking: Reported on 04/13/2015 01/05/15   Kyung Rudd, MD  glimepiride (AMARYL) 2 MG tablet Take 1 tablet (2 mg total) by mouth daily with breakfast. Patient not taking: Reported on 05/08/2015 12/20/14   Nishant Dhungel, MD  HYDROcodone-homatropine (HYCODAN) 5-1.5 MG/5ML syrup Take 5 mLs by mouth every 6 (six) hours as needed for cough. Patient not taking: Reported on 05/08/2015 04/13/15   Curt Bears, MD  methylPREDNISolone (MEDROL DOSEPAK) 4 MG TBPK tablet Use as instructed Patient not taking: Reported on 05/08/2015 04/13/15   Curt Bears, MD    Physical Exam:   Filed Vitals:   05/08/15 1134 05/08/15 1200 05/08/15 1210 05/08/15 1233  BP:  121/87  119/57  Pulse: 98 91 95 97  Temp:    98.4 F (36.9 C)  TempSrc:  Oral  Resp:  30 24   SpO2: 90% 92% 95% 95%     Physical Exam: Blood pressure 119/57, pulse 97, temperature 98.4 F (36.9 C), temperature source Oral, resp. rate 24, SpO2 95 %. Gen: Uncomfortable appearing.  Head: Normocephalic, atraumatic. Eyes: PERRL, EOMI, sclerae nonicteric. Mouth: Oropharynx clear with moist mucous membranes. Neck: Supple, no thyromegaly, no lymphadenopathy, no jugular venous distention. Chest: Lungs are coarse bilaterally with occasional rhonchi. CV: Heart sounds are regular. No murmurs, rubs, or gallops. Abdomen: Soft, nontender, nondistended with normal active bowel sounds. Extremities: Extremities are without clubbing, edema, or cyanosis. Skin: Warm and dry. Neuro: Alert and oriented times 3; grossly nonfocal. Psych: Mood and affect normal.   Data Review:    Labs: Basic Metabolic Panel:  Recent Labs Lab 05/08/15 0909  NA 139  K 4.0  CL 104  CO2 27  GLUCOSE 177*  BUN 11    CREATININE 0.64  CALCIUM 9.0   Liver Function Tests:  Recent Labs Lab 05/08/15 0909  AST 22  ALT 33  ALKPHOS 142*  BILITOT 0.5  PROT 6.7  ALBUMIN 3.0*   CBC:  Recent Labs Lab 05/08/15 0909  WBC 10.0  NEUTROABS 8.3*  HGB 11.6*  HCT 34.8*  MCV 82.7  PLT 470*   Radiographic Studies: Dg Chest 2 View  05/08/2015  CLINICAL DATA:  50 year old male with a history of right-sided rib pain. EXAM: CHEST - 2 VIEW COMPARISON:  03/30/2015, 03/02/2015, prior chest x-ray 01/04/2015 FINDINGS: Cardiomediastinal silhouette unchanged in size and contour. Over the comparison chest x-ray and CT studies, the patient has had improved in worsening of bilateral airspace/interstitial opacities. The current pattern of disease is worst in the most recent chest x-ray of 01/04/2015, and compared across modalities to the CT 03/30/2015. There are persisted interstitial opacities in the left greater than right mid lung and at the base on the lateral view. Unremarkable appearance of the upper abdomen. IMPRESSION: Multifocal, left greater than right airspace disease, compatible with multifocal infection. The patient has had waxing and waning disease over the course of the comparison plain film and CT. Followup PA and lateral chest X-ray is recommended in 3-4 weeks following trial of antibiotic therapy to ensure resolution and exclude underlying malignancy. Signed, Dulcy Fanny. Earleen Newport, DO [facility] Radiology Electronically Signed   By: Corrie Mckusick D.O.   On: 05/08/2015 09:58   Ct Angio Chest Pe W/cm &/or Wo Cm  05/08/2015  ADDENDUM REPORT: 05/08/2015 11:28 ADDENDUM: To be more specific, if this does represent progression of metastatic disease, the pattern of spread is more likely a combination of lymphangitis and endobronchial in nature. Additionally, in the appropriate clinical setting, a multi lobar pneumonia or atypical/fungal infectious process could have a similar  radiographic appearance. Bronchoscopy may be helpful for further evaluation. Electronically Signed   By: Jacqulynn Cadet M.D.   On: 05/08/2015 11:28  05/08/2015  CLINICAL DATA:  50 year old male with right-sided rib pain and cough for the past week or longer. Patient completed left thoracic radiation therapy for metastatic non-small cell lung cancer in December of 2016. Patient also has a history of cardiac tamponade requiring subxiphoid pericardial window and right-sided chest tube placement EXAM: CT ANGIOGRAPHY CHEST WITH CONTRAST TECHNIQUE: Multidetector CT imaging of the chest was performed using the standard protocol during bolus administration of intravenous contrast. Multiplanar CT image reconstructions and MIPs were obtained to evaluate the vascular anatomy. CONTRAST:  100 mL Isovue 370 COMPARISON:  Most recent prior chest  CT 03/30/2015 FINDINGS: Mediastinum: Similar appearance of left supraclavicular adenopathy. The largest nodal conglomerate measures approximately 2.8 x 1.8 cm which may be slightly smaller than previously measured at 3.3 x 2.4 cm. No definite mediastinal mass or adenopathy. Unremarkable thoracic esophagus. Heart/Vascular: Adequate opacification of the pulmonary arteries to the proximal subsegmental level. No evidence of acute pulmonary embolus. The heart is normal in size. No pericardial effusion. Normal caliber thoracic aorta. Lungs/Pleura: Moderate layering right-sided pleural effusion is new compared to February of 2017. Significant interval progression of metastatic disease bilaterally. The dominant nodule in the left upper lobe now measures 1.1 x 1.4 cm compared to 0.9 x 1.2 cm. Additionally, there has been extensive progression of diffuse bronchial wall thickening and tree-in-bud micro and macro nodularity throughout the right upper lung concerning for progressive lymphangitis spread of the disease. There is new focal segmental atelectasis in the posterior aspect of the right  middle lobe secondary to bronchial obstruction. Additionally, there is been significant interval progression of consolidation, volume loss and architectural distortion in the left upper and lower lobes in the region of prior radiation therapy. 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 Innumerable small pulmonary nodules throughout the left lower lobe are more prominent and greater in number. Bones/Soft Tissues: Stable blastic metastases in the T1 and T2 vertebral bodies without significant interval change. Upper Abdomen: Stable early enhancing lesion in hepatic segment 3 likely representative of a hemangioma. Relatively circumscribed low-attenuation lesion in the caudate is stable at 1.8 cm. Review of the MIP images confirms the above findings. IMPRESSION: 1. Negative for acute pulmonary embolus. 2. CT findings suggest significant interval progression of widespread pulmonary metastatic disease with probable diffuse lymphangitic spread of tumor throughout the right upper and middle lobes. Additionally, there is increasing extensive nodularity in the left lower lobe concerning for additional foci of metastatic disease. Enlarging left upper lobe metastatic nodule. 3. Interval reaccumulation of a moderate layering right-sided pleural effusion which is likely malignant. 4. Progressive consolidation, volume loss and architectural distortion in the left upper and lower lobes in the region of prior radiation therapy. 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 5. Stable T1 and T2 blastic osseous metastatic disease. No definite new bony lesion or evidence of pathologic fracture. 6. Slightly decreased left supraclavicular adenopathy. 7. Stable liver lesions 1 of which is almost certainly a hemangioma. Electronically Signed: By: Jacqulynn Cadet M.D. On: 05/08/2015 11:04   *I have personally reviewed the images above*  EKG: Independently reviewed. Sinus rhythm at 87 bpm.   Assessment/Plan:   Principal  Problem:   Pleuritic chest pain secondary to right pleural effusion in the setting of stage IV lung cancer - Source of pain is likely from malignant pleural effusion but postobstructive pneumonia in the differential. - Obtain thoracentesis and send pleural fluid for Gram stain, cell count and culture. - Cover with empiric antibiotics pending studies. - Discussed case with Dr. Julien Nordmann who will see him in consultation on 05/10/15. - We'll place on steroids in case lymphangitic spread of tumor is responsible for his pleuritic discomfort.  Active Problems:   Primary cancer of left upper lobe of lung ([place]) - Dr. Julien Nordmann aware of the patient's admission. - Hold Tarceva as this can cause interstitial lung disease and fatal side effects.    Bone metastasis ([facility]) - Fentanyl patch and ibuprofen ordered for pain control.    Hypoalbuminemia due to protein-calorie malnutrition [facility]) - Dietitian consultation.    Diabetes mellitus type 2 in nonobese ([facility]) - Steroids will likely exacerbate.  We'll start on moderate scale SSI and titrate up if needed.    CAP (community acquired pneumonia) - At risk for resistant organisms given immunosuppression and treatment at the cancer center. - Empiric vancomycin/Zosyn for now. Narrow with clinical improvement.    DVT prophylaxis - Lovenox ordered.  Code Status / Family Communication / Disposition Plan:   Code Status: Full. Family Communication: Mickie Kozikowski [phone]), Patient's wife, updated at bedside. Disposition Plan: Home when stable, likely 3-4 day hospital stay.    Time spent: One hour.  RAMA,CHRISTINA [facility] Pager [phone] Cell: [phone]   If 7PM-7AM, please contact night-coverage [email or web address] Password [facility] 05/08/2015, 3:14 PM

## 2015-05-09 ENCOUNTER — Inpatient Hospital Stay (HOSPITAL_COMMUNITY): Payer: Medicaid Other

## 2015-05-09 LAB — GLUCOSE, CAPILLARY
GLUCOSE-CAPILLARY: 214 mg/dL — AB (ref 65–99)
GLUCOSE-CAPILLARY: 315 mg/dL — AB (ref 65–99)
Glucose-Capillary: 154 mg/dL — ABNORMAL HIGH (ref 65–99)
Glucose-Capillary: 157 mg/dL — ABNORMAL HIGH (ref 65–99)

## 2015-05-09 LAB — BODY FLUID CELL COUNT WITH DIFFERENTIAL
Eos, Fluid: 3 %
LYMPHS FL: 36 %
Monocyte-Macrophage-Serous Fluid: 8 % — ABNORMAL LOW (ref 50–90)
NEUTROPHIL FLUID: 53 % — AB (ref 0–25)
OTHER CELLS FL: 0 %
Total Nucleated Cell Count, Fluid: 1310 cu mm — ABNORMAL HIGH (ref 0–1000)

## 2015-05-09 LAB — LACTATE DEHYDROGENASE, PLEURAL OR PERITONEAL FLUID: LD FL: 166 U/L — AB (ref 3–23)

## 2015-05-09 MED ORDER — ONDANSETRON HCL 4 MG/2ML IJ SOLN: 4.0000 mg | Freq: Four times a day (QID) | INTRAMUSCULAR | Status: DC | PRN

## 2015-05-09 MED ORDER — HYDROCOD POLST-CPM POLST ER 10-8 MG/5ML PO SUER
5.0000 mL | Freq: Once | ORAL | Status: AC
  Administered 2015-05-09: 5 mL via ORAL
  Filled 2015-05-09: qty 5

## 2015-05-09 MED ORDER — INSULIN GLARGINE 100 UNIT/ML ~~LOC~~ SOLN
10.0000 [IU] | Freq: Every day | SUBCUTANEOUS | Status: DC
  Administered 2015-05-09 – 2015-05-12 (×4): 10 [IU] via SUBCUTANEOUS
  Filled 2015-05-09 (×5): qty 0.1

## 2015-05-09 NOTE — Procedures (Signed)
Successful US guided right thoracentesis. Yielded 580 ml of clear yellow fluid. Pt tolerated procedure well. No immediate complications.  Specimen was sent for labs. CXR ordered.  Raini Tiley S Daruis Swaim PA-C 05/09/2015 11:42 AM

## 2015-05-09 NOTE — Progress Notes (Addendum)
Progress Note   Alexzander Dolinger NGE:[id number] DOB: [birth year] DOA: 05/08/2015 PCP: None, sees Dr. Julien Nordmann only   Brief Narrative:   Steven Roberts is an 50 y.o. 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 Julien Nordmann, disease complicated by cardiac tamponade s/p subxiphoid window/right pleural effusion s/p right chest tube. He has received palliative XRT to the left lung/left lateral hip and right hip, which completed 01/18/15. He is currently receiving Tarceva, and has been on this for the past 6 months. Patient has been seen by the oncology department on a fairly regular basis with complaints of progressive, chronic cough. A restaging CT of his chest, abdomen and pelvis was done 03/02/15 and showed improvement in his lung cancer. He has completed a course of antibiotic therapy with Levaquin and 2 rounds of steroids which have not significantly improved his symptoms, although there was a slight improvement after the first course and he was subsequently maintained on a maintenance dose of prednisone 10 mg daily. A CT scan was obtained in ED which was negative for pulmonary embolism, but did show findings with possible significant progression of lung cancer versus interstitial lung disease which can be a side effect of Tarceva. He was noted to drop his oxygen saturations into the mid 80s when ambulating. Discussed the case with Dr. Julien Nordmann who recommends discontinuation of Tarceva and who will see him on 05/10/15.  Assessment/Plan:   Principal Problem:  Pleuritic chest pain secondary to right pleural effusion in the setting of stage IV lung cancer - Source of pain is likely from malignant pleural effusion but postobstructive pneumonia in the differential. - For thoracentesis today.  Will send pleural fluid for Gram stain, cell count, LDH and culture. - Cover with empiric antibiotics pending studies. - Discussed case with Dr.  Julien Nordmann who will see him in consultation on 05/10/15. - Continue Solu-Medrol in case lymphangitic spread of tumor is responsible for his pleuritic discomfort.  Active Problems:  Primary cancer of left upper lobe of lung ([place]) - Dr. Julien Nordmann aware of the patient's admission. - Hold Tarceva as this can cause interstitial lung disease and fatal side effects.   Bone metastasis (HCC) - Fentanyl patch and ibuprofen ordered for pain control.   Hypoalbuminemia due to protein-calorie malnutrition [facility]) - Dietitian consultation.   Diabetes mellitus type 2 in nonobese [facility]) - Currently being managed with moderate scale SSI. CBGs 197-239. Will add 10 units of Lantus.   CAP (community acquired pneumonia) - At risk for resistant organisms given immunosuppression and treatment at the cancer center. - Empiric vancomycin/Zosyn for now. Narrow with clinical improvement.   DVT prophylaxis - Lovenox ordered.   Family Communication/Anticipated D/C date and plan/Code Status   Family Communication: Multiple family updated at the bedside. Disposition Plan/date: Home in 2-3 days if respiratory status/pleuritic pain improved. Code Status: Full code.   IV Access:    Peripheral IV   Procedures and diagnostic studies:   Dg Chest 2 View  05/08/2015  CLINICAL DATA:  50 year old male with a history of right-sided rib pain. EXAM: CHEST - 2 VIEW COMPARISON:  03/30/2015, 03/02/2015, prior chest x-ray 01/04/2015 FINDINGS: Cardiomediastinal silhouette unchanged in size and contour. Over the comparison chest x-ray and CT studies, the patient has had improved in worsening of bilateral airspace/interstitial opacities. The current pattern of disease is worst in the most recent chest x-ray of 01/04/2015, and compared across modalities to the CT 03/30/2015. There  are persisted interstitial opacities in the left greater than right mid lung and at the base on the lateral view. Unremarkable appearance of the upper  abdomen. IMPRESSION: Multifocal, left greater than right airspace disease, compatible with multifocal infection. The patient has had waxing and waning disease over the course of the comparison plain film and CT. Followup PA and lateral chest X-ray is recommended in 3-4 weeks following trial of antibiotic therapy to ensure resolution and exclude underlying malignancy. Signed, Dulcy Fanny. Earleen Newport, DO [facility] Electronically Signed   By: Corrie Mckusick D.O.   On: 05/08/2015 09:58   Ct Angio Chest Pe W/cm &/or Wo Cm  05/08/2015  ADDENDUM REPORT: 05/08/2015 11:28 ADDENDUM: To be more specific, if this does represent progression of metastatic disease, the pattern of spread is more likely a combination of lymphangitis and endobronchial in nature. Additionally, in the appropriate clinical setting, a multi lobar pneumonia or atypical/fungal infectious process could have a similar radiographic appearance. Bronchoscopy may be helpful for further evaluation. Electronically Signed   By: Jacqulynn Cadet M.D.   On: 05/08/2015 11:28  05/08/2015  CLINICAL DATA:  50 year old male with right-sided rib pain and cough for the past week or longer. Patient completed left thoracic radiation therapy for metastatic non-small cell lung cancer in December of 2016. Patient also has a history of cardiac tamponade requiring subxiphoid pericardial window and right-sided chest tube placement EXAM: CT ANGIOGRAPHY CHEST WITH CONTRAST TECHNIQUE: Multidetector CT imaging of the chest was performed using the standard protocol during bolus administration of intravenous contrast. Multiplanar CT image reconstructions and MIPs were obtained to evaluate the vascular anatomy. CONTRAST:  100 mL Isovue 370 COMPARISON:  Most recent prior chest CT 03/30/2015 FINDINGS: Mediastinum: Similar appearance of left supraclavicular adenopathy. The largest nodal conglomerate measures approximately 2.8 x 1.8 cm  which may be slightly smaller than previously measured at 3.3 x 2.4 cm. No definite mediastinal mass or adenopathy. Unremarkable thoracic esophagus. Heart/Vascular: Adequate opacification of the pulmonary arteries to the proximal subsegmental level. No evidence of acute pulmonary embolus. The heart is normal in size. No pericardial effusion. Normal caliber thoracic aorta. Lungs/Pleura: Moderate layering right-sided pleural effusion is new compared to February of 2017. Significant interval progression of metastatic disease bilaterally. The dominant nodule in the left upper lobe now measures 1.1 x 1.4 cm compared to 0.9 x 1.2 cm. Additionally, there has been extensive progression of diffuse bronchial wall thickening and tree-in-bud micro and macro nodularity throughout the right upper lung concerning for progressive lymphangitis spread of the disease. There is new focal segmental atelectasis in the posterior aspect of the right middle lobe secondary to bronchial obstruction. Additionally, there is been significant interval progression of consolidation, volume loss and architectural distortion in the left upper and lower lobes in the region of prior radiation therapy. 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 Innumerable small pulmonary nodules throughout the left lower lobe are more prominent and greater in number. Bones/Soft Tissues: Stable blastic metastases in the T1 and T2 vertebral bodies without significant interval change. Upper Abdomen: Stable early enhancing lesion in hepatic segment 3 likely representative of a hemangioma. Relatively circumscribed low-attenuation lesion in the caudate is stable at 1.8 cm. Review of the MIP images confirms the above findings. IMPRESSION: 1. Negative for acute pulmonary embolus. 2. CT findings suggest significant interval progression of widespread pulmonary metastatic disease with probable diffuse lymphangitic spread of tumor throughout the right upper and middle  lobes. Additionally, there is increasing extensive nodularity  in the left lower lobe concerning for additional foci of metastatic disease. Enlarging left upper lobe metastatic nodule. 3. Interval reaccumulation of a moderate layering right-sided pleural effusion which is likely malignant. 4. Progressive consolidation, volume loss and architectural distortion in the left upper and lower lobes in the region of prior radiation therapy. 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 5. Stable T1 and T2 blastic osseous metastatic disease. No definite new bony lesion or evidence of pathologic fracture. 6. Slightly decreased left supraclavicular adenopathy. 7. Stable liver lesions 1 of which is almost certainly a hemangioma. Electronically Signed: By: Jacqulynn Cadet M.D. On: 05/08/2015 11:04     Medical Consultants:    None.  Anti-Infectives:   Vanc/Zosyn 05/08/15--->  Subjective:   Haylen Shelnutt feels a bit better.  Awoke at 4 a.m. With severe pleuritic chest pain on the right, but pain eased off with pain medications.  Appetite good.  SOB improved.  Objective:    Filed Vitals:   05/08/15 1233 05/08/15 1528 05/08/15 2126 05/09/15 0514  BP: 119/57  132/60 146/60  Pulse: 97  92 87  Temp: 98.4 F (36.9 C)  98.2 F (36.8 C) 97.8 F (36.6 C)  TempSrc: Oral  Oral Oral  Resp:    18  Height:  '5\' 7"'$  (1.702 m)    Weight:  79.289 kg (174 lb 12.8 oz)    SpO2: 95%  90% 92%    Intake/Output Summary (Last 24 hours) at 05/09/15 0745 Last data filed at 05/09/15 0600  Gross per 24 hour  Intake    590 ml  Output    500 ml  Net     90 ml   Filed Weights   05/08/15 1528  Weight: 79.289 kg (174 lb 12.8 oz)    Exam: Gen:  NAD Cardiovascular:  RRR, No M/R/G Respiratory:  Lungs diminished but clear Gastrointestinal:  Abdomen soft, NT/ND, + BS Extremities:  No C/E/C   Data Reviewed:    Labs: Basic Metabolic Panel:  Recent Labs Lab 05/08/15 0909  NA 139  K 4.0  CL 104  CO2 27  GLUCOSE  177*  BUN 11  CREATININE 0.64  CALCIUM 9.0   GFR Estimated Creatinine Clearance: 106.7 mL/min (by C-G formula based on Cr of 0.64). Liver Function Tests:  Recent Labs Lab 05/08/15 0909  AST 22  ALT 33  ALKPHOS 142*  BILITOT 0.5  PROT 6.7  ALBUMIN 3.0*   CBC:  Recent Labs Lab 05/08/15 0909  WBC 10.0  NEUTROABS 8.3*  HGB 11.6*  HCT 34.8*  MCV 82.7  PLT 470*   CBG:  Recent Labs Lab 05/08/15 1727 05/08/15 2121  GLUCAP 197* 239*    Microbiology No results found for this or any previous visit (from the past 240 hour(s)).   Medications:   . enoxaparin (LOVENOX) injection  40 mg Subcutaneous Q24H  . fentaNYL  25 mcg Transdermal Q72H  . insulin aspart  0-15 Units Subcutaneous TID WC  . insulin aspart  0-5 Units Subcutaneous QHS  . methylPREDNISolone (SOLU-MEDROL) injection  40 mg Intravenous BID  . piperacillin-tazobactam (ZOSYN)  IV  3.375 g Intravenous 3 times per day  . vancomycin  1,250 mg Intravenous Q12H   Continuous Infusions:   Time spent: 35 minutes with > 50% of time discussing current diagnostic test results, clinical impression and plan of care.   LOS: 1 day   Caroly Purewal  [facility] Pager [phone]. If unable to reach me by pager, please call my cell phone at [phone].  *  Please refer to [email or web address], password [email or web address] to get updated schedule on who will round on this patient, as hospitalists switch teams weekly. If 7PM-7AM, please contact night-coverage at [email or web address], password [email or web address] for any overnight needs.  05/09/2015, 7:45 AM

## 2015-05-10 LAB — GLUCOSE, CAPILLARY
GLUCOSE-CAPILLARY: 252 mg/dL — AB (ref 65–99)
Glucose-Capillary: 160 mg/dL — ABNORMAL HIGH (ref 65–99)
Glucose-Capillary: 128 mg/dL — ABNORMAL HIGH (ref 65–99)
Glucose-Capillary: 161 mg/dL — ABNORMAL HIGH (ref 65–99)

## 2015-05-10 LAB — STREP PNEUMONIAE URINARY ANTIGEN: Strep Pneumo Urinary Antigen: NEGATIVE

## 2015-05-10 MED ORDER — METHYLPREDNISOLONE SODIUM SUCC 40 MG IJ SOLR
40.0000 mg | INTRAMUSCULAR | Status: DC
  Administered 2015-05-10: 40 mg via INTRAVENOUS
  Filled 2015-05-10 (×2): qty 1

## 2015-05-10 MED ORDER — DOXYCYCLINE HYCLATE 100 MG PO TABS
100.0000 mg | ORAL_TABLET | Freq: Two times a day (BID) | ORAL | Status: DC
  Administered 2015-05-10 (×2): 100 mg via ORAL
  Filled 2015-05-10 (×4): qty 1

## 2015-05-10 MED ORDER — CEFUROXIME AXETIL 500 MG PO TABS
500.0000 mg | ORAL_TABLET | Freq: Two times a day (BID) | ORAL | Status: DC
  Administered 2015-05-10 – 2015-05-11 (×2): 500 mg via ORAL
  Filled 2015-05-10 (×4): qty 1

## 2015-05-10 NOTE — Progress Notes (Signed)
Progress Note   Steven Roberts PYP:[id number] DOB: 1965/09/03 DOA: 05/08/2015 PCP: None, sees Dr. Julien Nordmann only   Brief Narrative:   Steven Roberts is an 50 y.o. 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 Julien Nordmann, disease complicated by cardiac tamponade s/p subxiphoid window/right pleural effusion s/p right chest tube. He has received palliative XRT to the left lung/left lateral hip and right hip, which completed 01/18/15. He is currently receiving Tarceva, and has been on this for the past 6 months. Patient has been seen by the oncology department on a fairly regular basis with complaints of progressive, chronic cough. A restaging CT of his chest, abdomen and pelvis was done 03/02/15 and showed improvement in his lung cancer. He has completed a course of antibiotic therapy with Levaquin and 2 rounds of steroids which have not significantly improved his symptoms, although there was a slight improvement after the first course and he was subsequently maintained on a maintenance dose of prednisone 10 mg daily. A CT scan was obtained in ED which was negative for pulmonary embolism, but did show findings with possible significant progression of lung cancer versus interstitial lung disease which can be a side effect of Tarceva. He was noted to drop his oxygen saturations into the mid 80s when ambulating. Discussed the case with Dr. Julien Nordmann who recommends discontinuation of Tarceva and who will see him on 05/10/15.  Assessment/Plan:   Principal Problem:  Pleuritic chest pain secondary to right pleural effusion in the setting of stage IV lung cancer - Source of pain is likely from malignant pleural effusion but postobstructive pneumonia in the differential. - Underwent 580 mL thoracentesis 05/09/15.  Pleural fluid LDH 166, WBC 1310, 53% neutrophils. F/U culture. - Narrow antibiotics to Ceftin/doxy. Follow-up blood cultures. -  Discussed case with Dr. Julien Nordmann who will see him in consultation on 05/10/15. - Wean Solu-Medrol.  Active Problems:  Primary cancer of left upper lobe of lung ([place]) - Dr. Julien Nordmann aware of the patient's admission. - Hold Tarceva as this can cause interstitial lung disease and fatal side effects.   Bone metastasis (HCC) - Fentanyl patch and ibuprofen ordered for pain control.   Hypoalbuminemia due to protein-calorie malnutrition [facility]) - Dietitian consultation.   Diabetes mellitus type 2 in nonobese [facility]) - Currently being managed with moderate scale SSI and 10 units of Lantus. CBGs 154-315 (315 outier).   CAP (community acquired pneumonia) - At risk for resistant organisms given immunosuppression and treatment at the cancer center. - Empiric vancomycin/Zosyn for now. Narrow with clinical improvement.   DVT prophylaxis - Lovenox ordered.   Family Communication/Anticipated D/C date and plan/Code Status   Family Communication: Multiple family updated at the bedside. Disposition Plan/date: Home in 2-3 days if respiratory status/pleuritic pain improved. Code Status: Full code.   IV Access:    Peripheral IV   Procedures and diagnostic studies:   Dg Chest 2 View  05/08/2015  CLINICAL DATA:  50 year old male with a history of right-sided rib pain. EXAM: CHEST - 2 VIEW COMPARISON:  03/30/2015, 03/02/2015, prior chest x-ray 01/04/2015 FINDINGS: Cardiomediastinal silhouette unchanged in size and contour. Over the comparison chest x-ray and CT studies, the patient has had improved in worsening of bilateral airspace/interstitial opacities. The current pattern of disease is worst in the most recent chest x-ray of 01/04/2015, and compared across modalities to the CT 03/30/2015. There are persisted interstitial opacities in the left greater than right  mid lung and at the base on the lateral view. Unremarkable appearance of the upper abdomen. IMPRESSION: Multifocal, left greater than right  airspace disease, compatible with multifocal infection. The patient has had waxing and waning disease over the course of the comparison plain film and CT. Followup PA and lateral chest X-ray is recommended in 3-4 weeks following trial of antibiotic therapy to ensure resolution and exclude underlying malignancy. Signed, Dulcy Fanny. Earleen Newport, DO [facility] Electronically Signed   By: Corrie Mckusick D.O.   On: 05/08/2015 09:58   Ct Angio Chest Pe W/cm &/or Wo Cm  05/08/2015  ADDENDUM REPORT: 05/08/2015 11:28 ADDENDUM: To be more specific, if this does represent progression of metastatic disease, the pattern of spread is more likely a combination of lymphangitis and endobronchial in nature. Additionally, in the appropriate clinical setting, a multi lobar pneumonia or atypical/fungal infectious process could have a similar radiographic appearance. Bronchoscopy may be helpful for further evaluation. Electronically Signed   By: Jacqulynn Cadet M.D.   On: 05/08/2015 11:28  05/08/2015  CLINICAL DATA:  50 year old male with right-sided rib pain and cough for the past week or longer. Patient completed left thoracic radiation therapy for metastatic non-small cell lung cancer in December of 2016. Patient also has a history of cardiac tamponade requiring subxiphoid pericardial window and right-sided chest tube placement EXAM: CT ANGIOGRAPHY CHEST WITH CONTRAST TECHNIQUE: Multidetector CT imaging of the chest was performed using the standard protocol during bolus administration of intravenous contrast. Multiplanar CT image reconstructions and MIPs were obtained to evaluate the vascular anatomy. CONTRAST:  100 mL Isovue 370 COMPARISON:  Most recent prior chest CT 03/30/2015 FINDINGS: Mediastinum: Similar appearance of left supraclavicular adenopathy. The largest nodal conglomerate measures approximately 2.8 x 1.8 cm which may be slightly smaller than previously measured at  3.3 x 2.4 cm. No definite mediastinal mass or adenopathy. Unremarkable thoracic esophagus. Heart/Vascular: Adequate opacification of the pulmonary arteries to the proximal subsegmental level. No evidence of acute pulmonary embolus. The heart is normal in size. No pericardial effusion. Normal caliber thoracic aorta. Lungs/Pleura: Moderate layering right-sided pleural effusion is new compared to February of 2017. Significant interval progression of metastatic disease bilaterally. The dominant nodule in the left upper lobe now measures 1.1 x 1.4 cm compared to 0.9 x 1.2 cm. Additionally, there has been extensive progression of diffuse bronchial wall thickening and tree-in-bud micro and macro nodularity throughout the right upper lung concerning for progressive lymphangitis spread of the disease. There is new focal segmental atelectasis in the posterior aspect of the right middle lobe secondary to bronchial obstruction. Additionally, there is been significant interval progression of consolidation, volume loss and architectural distortion in the left upper and lower lobes in the region of prior radiation therapy. 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 Innumerable small pulmonary nodules throughout the left lower lobe are more prominent and greater in number. Bones/Soft Tissues: Stable blastic metastases in the T1 and T2 vertebral bodies without significant interval change. Upper Abdomen: Stable early enhancing lesion in hepatic segment 3 likely representative of a hemangioma. Relatively circumscribed low-attenuation lesion in the caudate is stable at 1.8 cm. Review of the MIP images confirms the above findings. IMPRESSION: 1. Negative for acute pulmonary embolus. 2. CT findings suggest significant interval progression of widespread pulmonary metastatic disease with probable diffuse lymphangitic spread of tumor throughout the right upper and middle lobes. Additionally, there is increasing extensive nodularity  in the left lower lobe concerning for additional foci of  metastatic disease. Enlarging left upper lobe metastatic nodule. 3. Interval reaccumulation of a moderate layering right-sided pleural effusion which is likely malignant. 4. Progressive consolidation, volume loss and architectural distortion in the left upper and lower lobes in the region of prior radiation therapy. 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 5. Stable T1 and T2 blastic osseous metastatic disease. No definite new bony lesion or evidence of pathologic fracture. 6. Slightly decreased left supraclavicular adenopathy. 7. Stable liver lesions 1 of which is almost certainly a hemangioma. Electronically Signed: By: Jacqulynn Cadet M.D. On: 05/08/2015 11:04     Medical Consultants:    None.  Anti-Infectives:   Vanc/Zosyn 05/08/15--->  Subjective:   Steven Roberts feels better, right sided pleuritic pain much better after thoracentesis.  No fever/chills.  No nausea or vomiting.  Objective:    Filed Vitals:   05/09/15 1147 05/09/15 1317 05/09/15 2210 05/10/15 0542  BP: 126/58 120/64 120/54 125/64  Pulse:  82 82 69  Temp:  98.2 F (36.8 C) 98.6 F (37 C) 98.7 F (37.1 C)  TempSrc:  Oral Oral Oral  Resp:  '18 18 18  '$ Height:      Weight:      SpO2: 95% 99% 94% 95%    Intake/Output Summary (Last 24 hours) at 05/10/15 0813 Last data filed at 05/10/15 0600  Gross per 24 hour  Intake    770 ml  Output   1400 ml  Net   -630 ml   Filed Weights   05/08/15 1528  Weight: 79.289 kg (174 lb 12.8 oz)    Exam: Gen:  NAD Cardiovascular:  RRR, No M/R/G Respiratory:  Lungs diminished in the bases but clear Gastrointestinal:  Abdomen soft, NT/ND, + BS Extremities:  No C/E/C   Data Reviewed:    Labs: Basic Metabolic Panel:  Recent Labs Lab 05/08/15 0909  NA 139  K 4.0  CL 104  CO2 27  GLUCOSE 177*  BUN 11  CREATININE 0.64  CALCIUM 9.0   GFR Estimated Creatinine Clearance: 106.7 mL/min (by C-G formula  based on Cr of 0.64). Liver Function Tests:  Recent Labs Lab 05/08/15 0909  AST 22  ALT 33  ALKPHOS 142*  BILITOT 0.5  PROT 6.7  ALBUMIN 3.0*   CBC:  Recent Labs Lab 05/08/15 0909  WBC 10.0  NEUTROABS 8.3*  HGB 11.6*  HCT 34.8*  MCV 82.7  PLT 470*   CBG:  Recent Labs Lab 05/09/15 0802 05/09/15 1214 05/09/15 1739 05/09/15 2120 05/10/15 0737  GLUCAP 154* 214* 315* 157* 160*    Microbiology Recent Results (from the past 240 hour(s))  Culture, blood (routine x 2) Call MD if unable to obtain prior to antibiotics being given     Status: None (Preliminary result)   Collection Time: 05/08/15  1:00 PM  Result Value Ref Range Status   Specimen Description BLOOD  RIGHT ARM  Final   Special Requests  10 ML BAA  Final   Culture   Final    NO GROWTH < 24 HOURS Performed at [facility]    Report Status PENDING  Incomplete  Culture, blood (routine x 2) Call MD if unable to obtain prior to antibiotics being given     Status: None (Preliminary result)   Collection Time: 05/08/15  1:10 PM  Result Value Ref Range Status   Specimen Description BLOOD  LEFT BAC  Final   Special Requests  10 ML AEB  Final   Culture   Final  NO GROWTH < 24 HOURS Performed at [facility]    Report Status PENDING  Incomplete  Body fluid culture     Status: None (Preliminary result)   Collection Time: 05/09/15 11:58 AM  Result Value Ref Range Status   Specimen Description Pleural R  Final   Special Requests NONE  Final   Gram Stain   Final    CYTOSPIN SLIDE WBC PRESENT,BOTH PMN AND MONONUCLEAR NO ORGANISMS SEEN Performed at [facility]    Culture PENDING  Incomplete   Report Status PENDING  Incomplete     Medications:   . enoxaparin (LOVENOX) injection  40 mg Subcutaneous Q24H  . fentaNYL  25 mcg Transdermal Q72H  . insulin aspart  0-15 Units Subcutaneous TID WC  . insulin aspart  0-5 Units Subcutaneous QHS  . insulin glargine  10 Units Subcutaneous QAC  breakfast  . methylPREDNISolone (SOLU-MEDROL) injection  40 mg Intravenous BID  . piperacillin-tazobactam (ZOSYN)  IV  3.375 g Intravenous 3 times per day  . vancomycin  1,250 mg Intravenous Q12H   Continuous Infusions:   Time spent: 25 minutes.   LOS: 2 days   Steven Roberts  [facility] Pager [phone]. If unable to reach me by pager, please call my cell phone at [phone].  *Please refer to [email or web address], password [email or web address] to get updated schedule on who will round on this patient, as hospitalists switch teams weekly. If 7PM-7AM, please contact night-coverage at [email or web address], password [email or web address] for any overnight needs.  05/10/2015, 8:13 AM

## 2015-05-11 ENCOUNTER — Other Ambulatory Visit: Payer: Self-pay

## 2015-05-11 ENCOUNTER — Ambulatory Visit: Payer: Self-pay | Admitting: Internal Medicine

## 2015-05-11 ENCOUNTER — Inpatient Hospital Stay (HOSPITAL_COMMUNITY): Payer: Medicaid Other

## 2015-05-11 LAB — GLUCOSE, CAPILLARY
GLUCOSE-CAPILLARY: 176 mg/dL — AB (ref 65–99)
GLUCOSE-CAPILLARY: 281 mg/dL — AB (ref 65–99)
Glucose-Capillary: 126 mg/dL — ABNORMAL HIGH (ref 65–99)
Glucose-Capillary: 178 mg/dL — ABNORMAL HIGH (ref 65–99)

## 2015-05-11 MED ORDER — GUAIFENESIN-DM 100-10 MG/5ML PO SYRP
5.0000 mL | ORAL_SOLUTION | ORAL | Status: DC | PRN
  Administered 2015-05-11 – 2015-05-12 (×5): 5 mL via ORAL
  Filled 2015-05-11 (×5): qty 10

## 2015-05-11 MED ORDER — SODIUM CHLORIDE 0.9 % IV SOLN
1250.0000 mg | Freq: Two times a day (BID) | INTRAVENOUS | Status: DC
  Administered 2015-05-11 – 2015-05-12 (×3): 1250 mg via INTRAVENOUS
  Filled 2015-05-11 (×4): qty 1250

## 2015-05-11 MED ORDER — DEXTROSE 5 % IV SOLN
1.0000 g | Freq: Three times a day (TID) | INTRAVENOUS | Status: DC
  Administered 2015-05-11 – 2015-05-12 (×4): 1 g via INTRAVENOUS
  Filled 2015-05-11 (×5): qty 1

## 2015-05-11 MED ORDER — METHYLPREDNISOLONE SODIUM SUCC 40 MG IJ SOLR
40.0000 mg | Freq: Two times a day (BID) | INTRAMUSCULAR | Status: DC
  Administered 2015-05-11 – 2015-05-12 (×3): 40 mg via INTRAVENOUS
  Filled 2015-05-11 (×4): qty 1

## 2015-05-11 NOTE — Progress Notes (Signed)
Progress Note   Steven Roberts GGY:[id number] DOB: 13-Dec-1965 DOA: 05/08/2015 PCP: None, sees Dr. Julien Nordmann only   Brief Narrative:   Steven Roberts is an 50 y.o. 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 Julien Nordmann, disease complicated by cardiac tamponade s/p subxiphoid window/right pleural effusion s/p right chest tube. He has received palliative XRT to the left lung/left lateral hip and right hip, which completed 01/18/15. He is currently receiving Tarceva, and has been on this for the past 6 months. Patient has been seen by the oncology department on a fairly regular basis with complaints of progressive, chronic cough. A restaging CT of his chest, abdomen and pelvis was done 03/02/15 and showed improvement in his lung cancer. He has completed a course of antibiotic therapy with Levaquin and 2 rounds of steroids which have not significantly improved his symptoms, although there was a slight improvement after the first course and he was subsequently maintained on a maintenance dose of prednisone 10 mg daily. A CT scan was obtained in ED which was negative for pulmonary embolism, but did show findings with possible significant progression of lung cancer versus interstitial lung disease which can be a side effect of Tarceva. He was noted to drop his oxygen saturations into the mid 80s when ambulating. Discussed the case with Dr. Julien Nordmann who recommends discontinuation of Tarceva and who will see him on 05/10/15.  Assessment/Plan:   Principal Problem:  Pleuritic chest pain secondary to right pleural effusion in the setting of stage IV lung cancer - Source of pain is likely from malignant pleural effusion but postobstructive pneumonia in the differential. - Underwent 580 mL thoracentesis 05/09/15.  Pleural fluid LDH 166, WBC 1310, 53% neutrophils. F/U culture. - Broaden antibiotics back to cefepime/franc given clinical worsening  overnight. Follow-up blood cultures. - Increase Solu-Medrol to 40 mg IV every 12 hours. - Check chest x-ray.  Active Problems:  Primary cancer of left upper lobe of lung ([facility]) - Dr. Julien Nordmann aware of the patient's admission. - Hold Tarceva as this can cause interstitial lung disease and fatal side effects.   Bone metastasis (HCC) - Fentanyl patch and ibuprofen ordered for pain control.   Hypoalbuminemia due to protein-calorie malnutrition [facility]) - Dietitian consultation.   Diabetes mellitus type 2 in nonobese [facility]) - Currently being managed with moderate scale SSI and 10 units of Lantus. CBGs [id number].   CAP (community acquired pneumonia) - At risk for resistant organisms given immunosuppression and treatment at the cancer center. - Antibiotics broadened to cefepime/vancomycin.   DVT prophylaxis - Lovenox ordered.   Family Communication/Anticipated D/C date and plan/Code Status   Family Communication: Wife updated at the bedside. Disposition Plan/date: Home in 2 days if respiratory status/pleuritic pain improved. Code Status: Full code.   IV Access:    Peripheral IV   Procedures and diagnostic studies:   Dg Chest 2 View  05/08/2015  CLINICAL DATA:  50 year old male with a history of right-sided rib pain. EXAM: CHEST - 2 VIEW COMPARISON:  03/30/2015, 03/02/2015, prior chest x-ray 01/04/2015 FINDINGS: Cardiomediastinal silhouette unchanged in size and contour. Over the comparison chest x-ray and CT studies, the patient has had improved in worsening of bilateral airspace/interstitial opacities. The current pattern of disease is worst in the most recent chest x-ray of 01/04/2015, and compared across modalities to the CT 03/30/2015. There are persisted interstitial opacities in the left greater than right mid lung and at the  base on the lateral view. Unremarkable appearance of the upper abdomen. IMPRESSION: Multifocal, left greater than right airspace disease, compatible with  multifocal infection. The patient has had waxing and waning disease over the course of the comparison plain film and CT. Followup PA and lateral chest X-ray is recommended in 3-4 weeks following trial of antibiotic therapy to ensure resolution and exclude underlying malignancy. Signed, Dulcy Fanny. Earleen Newport, DO [facility] Radiology Electronically Signed   By: Corrie Mckusick D.O.   On: 05/08/2015 09:58   Ct Angio Chest Pe W/cm &/or Wo Cm  05/08/2015  ADDENDUM REPORT: 05/08/2015 11:28 ADDENDUM: To be more specific, if this does represent progression of metastatic disease, the pattern of spread is more likely a combination of lymphangitis and endobronchial in nature. Additionally, in the appropriate clinical setting, a multi lobar pneumonia or atypical/fungal infectious process could have a similar radiographic appearance. Bronchoscopy may be helpful for further evaluation. Electronically Signed   By: Jacqulynn Cadet M.D.   On: 05/08/2015 11:28  05/08/2015  CLINICAL DATA:  50 year old male with right-sided rib pain and cough for the past week or longer. Patient completed left thoracic radiation therapy for metastatic non-small cell lung cancer in December of 2016. Patient also has a history of cardiac tamponade requiring subxiphoid pericardial window and right-sided chest tube placement EXAM: CT ANGIOGRAPHY CHEST WITH CONTRAST TECHNIQUE: Multidetector CT imaging of the chest was performed using the standard protocol during bolus administration of intravenous contrast. Multiplanar CT image reconstructions and MIPs were obtained to evaluate the vascular anatomy. CONTRAST:  100 mL Isovue 370 COMPARISON:  Most recent prior chest CT 03/30/2015 FINDINGS: Mediastinum: Similar appearance of left supraclavicular adenopathy. The largest nodal conglomerate measures approximately 2.8 x 1.8 cm which may be slightly smaller than previously measured at 3.3 x 2.4 cm. No definite  mediastinal mass or adenopathy. Unremarkable thoracic esophagus. Heart/Vascular: Adequate opacification of the pulmonary arteries to the proximal subsegmental level. No evidence of acute pulmonary embolus. The heart is normal in size. No pericardial effusion. Normal caliber thoracic aorta. Lungs/Pleura: Moderate layering right-sided pleural effusion is new compared to February of 2017. Significant interval progression of metastatic disease bilaterally. The dominant nodule in the left upper lobe now measures 1.1 x 1.4 cm compared to 0.9 x 1.2 cm. Additionally, there has been extensive progression of diffuse bronchial wall thickening and tree-in-bud micro and macro nodularity throughout the right upper lung concerning for progressive lymphangitis spread of the disease. There is new focal segmental atelectasis in the posterior aspect of the right middle lobe secondary to bronchial obstruction. Additionally, there is been significant interval progression of consolidation, volume loss and architectural distortion in the left upper and lower lobes in the region of prior radiation therapy. 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 Innumerable small pulmonary nodules throughout the left lower lobe are more prominent and greater in number. Bones/Soft Tissues: Stable blastic metastases in the T1 and T2 vertebral bodies without significant interval change. Upper Abdomen: Stable early enhancing lesion in hepatic segment 3 likely representative of a hemangioma. Relatively circumscribed low-attenuation lesion in the caudate is stable at 1.8 cm. Review of the MIP images confirms the above findings. IMPRESSION: 1. Negative for acute pulmonary embolus. 2. CT findings suggest significant interval progression of widespread pulmonary metastatic disease with probable diffuse lymphangitic spread of tumor throughout the right upper and middle lobes. Additionally, there is increasing extensive nodularity in the left lower lobe  concerning for additional foci of metastatic disease. Enlarging left upper  lobe metastatic nodule. 3. Interval reaccumulation of a moderate layering right-sided pleural effusion which is likely malignant. 4. Progressive consolidation, volume loss and architectural distortion in the left upper and lower lobes in the region of prior radiation therapy. 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 5. Stable T1 and T2 blastic osseous metastatic disease. No definite new bony lesion or evidence of pathologic fracture. 6. Slightly decreased left supraclavicular adenopathy. 7. Stable liver lesions 1 of which is almost certainly a hemangioma. Electronically Signed: By: Jacqulynn Cadet M.D. On: 05/08/2015 11:04     Medical Consultants:    None.  Anti-Infectives:   Vanc/Zosyn 05/08/15--->  Subjective:   Steven Roberts has had recurrence of right-sided pleuritic chest pain and feels worse today after attempting to narrow his antibiotics to doxycycline and Ceftin and reducing his Solu-Medrol to daily dosing. Denies cough. Feels a bit short of breath at times. No fever/chills.  Objective:    Filed Vitals:   05/10/15 1438 05/10/15 2025 05/11/15 0612 05/11/15 0900  BP: 123/67 112/59 119/66 127/67  Pulse: 85 87 72 85  Temp: 98.6 F (37 C) 98.2 F (36.8 C) 97.9 F (36.6 C) 98.3 F (36.8 C)  TempSrc: Oral Oral Oral Oral  Resp: '18 17 18 20  '$ Height:      Weight:      SpO2: 95% 93% 96% 96%    Intake/Output Summary (Last 24 hours) at 05/11/15 1005 Last data filed at 05/11/15 0900  Gross per 24 hour  Intake    120 ml  Output    825 ml  Net   -705 ml   Filed Weights   05/08/15 1528  Weight: 79.289 kg (174 lb 12.8 oz)    Exam: Gen:  NAD Cardiovascular:  RRR, No M/R/G Respiratory:  Lungs clear Gastrointestinal:  Abdomen soft, NT/ND, + BS Extremities:  No C/E/C   Data Reviewed:    Labs: Basic Metabolic Panel:  Recent Labs Lab 05/08/15 0909  NA 139  K 4.0  CL 104  CO2 27  GLUCOSE  177*  BUN 11  CREATININE 0.64  CALCIUM 9.0   GFR Estimated Creatinine Clearance: 106.7 mL/min (by C-G formula based on Cr of 0.64). Liver Function Tests:  Recent Labs Lab 05/08/15 0909  AST 22  ALT 33  ALKPHOS 142*  BILITOT 0.5  PROT 6.7  ALBUMIN 3.0*   CBC:  Recent Labs Lab 05/08/15 0909  WBC 10.0  NEUTROABS 8.3*  HGB 11.6*  HCT 34.8*  MCV 82.7  PLT 470*   CBG:  Recent Labs Lab 05/10/15 0737 05/10/15 1151 05/10/15 1702 05/10/15 2022 05/11/15 0739  GLUCAP 160* 252* 128* 161* 126*    Microbiology Recent Results (from the past 240 hour(s))  Culture, blood (routine x 2) Call MD if unable to obtain prior to antibiotics being given     Status: None (Preliminary result)   Collection Time: 05/08/15  1:00 PM  Result Value Ref Range Status   Specimen Description BLOOD  RIGHT ARM  Final   Special Requests  10 ML BAA  Final   Culture   Final    NO GROWTH 2 DAYS Performed at [facility]    Report Status PENDING  Incomplete  Culture, blood (routine x 2) Call MD if unable to obtain prior to antibiotics being given     Status: None (Preliminary result)   Collection Time: 05/08/15  1:10 PM  Result Value Ref Range Status   Specimen Description BLOOD  LEFT BAC  Final   Special  Requests  10 ML AEB  Final   Culture   Final    NO GROWTH 2 DAYS Performed at [facility]    Report Status PENDING  Incomplete  Body fluid culture     Status: None (Preliminary result)   Collection Time: 05/09/15 11:58 AM  Result Value Ref Range Status   Specimen Description Pleural R  Final   Special Requests NONE  Final   Gram Stain   Final    CYTOSPIN SLIDE WBC PRESENT,BOTH PMN AND MONONUCLEAR NO ORGANISMS SEEN    Culture   Final    NO GROWTH 2 DAYS Performed at [facility]    Report Status PENDING  Incomplete     Medications:   . cefUROXime  500 mg Oral BID WC  . doxycycline  100 mg Oral Q12H  . enoxaparin (LOVENOX) injection  40 mg Subcutaneous  Q24H  . fentaNYL  25 mcg Transdermal Q72H  . insulin aspart  0-15 Units Subcutaneous TID WC  . insulin aspart  0-5 Units Subcutaneous QHS  . insulin glargine  10 Units Subcutaneous QAC breakfast  . methylPREDNISolone (SOLU-MEDROL) injection  40 mg Intravenous Q24H   Continuous Infusions:   Time spent: 35 minutes with > 50% of time discussing current diagnostic test results, clinical impression and plan of care.   LOS: 3 days   [facility] Pager [phone]. If unable to reach me by pager, please call my cell phone at [phone].  *Please refer to [email or web address], password [email or web address] to get updated schedule on who will round on this patient, as hospitalists switch teams weekly. If 7PM-7AM, please contact night-coverage at [email or web address], password [email or web address] for any overnight needs.  05/11/2015, 10:05 AM

## 2015-05-11 NOTE — Progress Notes (Signed)
Patient is complaining of a lot of pain to lower right ribs.  More painful when he coughs.

## 2015-05-11 NOTE — Progress Notes (Signed)
Pharmacy Antibiotic Note  Steven Roberts is a 50 y.o. male admitted on 05/08/2015 with possible pneumonia.  Pharmacy has been consulted for vancomycin/cefepime dosing. He has lung cancer with mets to bone.  Has right pleural effusion in the setting of stage IV lung cancer - likely malignant pleural effusion but postobstructive pneumonia in the differential per MD.  Antibiotics were narrowed on 3/27 but are escalating therapy today.   Plan:  Resume Vancomycin '1250mg'$  IV q12h  Cefepime 1g IV q8h.  Height: '5\' 7"'$  (170.2 cm) Weight: 174 lb 12.8 oz (79.289 kg) IBW/kg (Calculated) : 66.1  Temp (24hrs), Avg:98.3 F (36.8 C), Min:97.9 F (36.6 C), Max:98.6 F (37 C)   Recent Labs Lab 05/08/15 0909  WBC 10.0  CREATININE 0.64    Estimated Creatinine Clearance: 106.7 mL/min (by C-G formula based on Cr of 0.64).    Allergies  Allergen Reactions  . Hydrocodone-Acetaminophen Nausea Only    Numbness - patient takes this medication and has no issues taking 1 tablet at a time    Antimicrobials this admission: Vancomycin 3/25 >>  3/27, resumed 3/28 >> Pip/tazo 3/25 >> 3/27 Ceftin 3/27 >> Doxy 3/27 >> Cefepime 3/28 >>  Dose adjustments this admission: -  Microbiology results: 3/25 Blood x2: ngtd x 2 days 3/26 body fluid (pleural fluid): ngtd x 2 days  Thank you for allowing pharmacy to be a part of this patient's care.  Hershal Coria, PharmD, BCPS Pager: [phone] 05/11/2015 10:23 AM

## 2015-05-12 LAB — GLUCOSE, CAPILLARY
Glucose-Capillary: 184 mg/dL — ABNORMAL HIGH (ref 65–99)
Glucose-Capillary: 171 mg/dL — ABNORMAL HIGH (ref 65–99)

## 2015-05-12 MED ORDER — METHYLPREDNISOLONE 4 MG PO TBPK: ORAL_TABLET | ORAL | Status: DC

## 2015-05-12 MED ORDER — LEVOFLOXACIN 750 MG PO TABS: 750.0000 mg | ORAL_TABLET | Freq: Every day | ORAL | Status: DC

## 2015-05-12 MED FILL — levoFLOXacin 750 MG TABS: 750 | 5 days supply | Qty: 5 | Fill #0

## 2015-05-12 MED FILL — METHYLPREDNISOLONE 4 MG TAB: 4 | 6 days supply | Qty: 21 | Fill #0

## 2015-05-12 NOTE — Discharge Summary (Signed)
Steven Roberts, is a 50 y.o. male  DOB [birth year]  MRN [id number].  Admission date:  05/08/2015  Admitting Physician  Venetia Maxon Rama, MD  Discharge Date:  05/12/2015   Primary MD  Pcp Not In System  Recommendations for primary care physician for things to follow:  - Please follow on final results of pleural fluid cultures, monitor clinically for recurrent pleural effusion as may need repeat thoracentesis on as-needed basis. - Tarceva has been held on discharge per oncology recommendation.   Admission Diagnosis  CAP (community acquired pneumonia) [J18.9]   Discharge Diagnosis  CAP (community acquired pneumonia) [J18.9]    Principal Problem:   Pleuritic chest pain Active Problems:   Primary cancer of left upper lobe of lung ([place])   Bone metastasis ([place])   Hypoalbuminemia due to protein-calorie malnutrition ([place])   Diabetes mellitus type 2 in nonobese ([place])   CAP (community acquired pneumonia)   Pleural effusion on right      Past Medical History  Diagnosis Date  . Bone cancer ([place]) 11/03/14 Pet scan    left 6th rib, right hip  . Lung cancer ([place]) 10/23/14    LUL adenocarcinoma, non-small cell  . Pericardial effusion with cardiac tamponade 12/11/2014  . Pulmonary infiltrates  c/w Adenoca/ Stage IV  10/13/2014    - symptom onset march 2016 - Baseline cxr on July 20 2014 reported to be nl  > then CT chest 10/13/2014 LUL as dz  Assoc with   LUL nodular density/ mild adenopathy - Quantiferon Gold 10/13/2014 > neg  - FOB 10/23/14 > cobblestoning beyond Lingular orifice with diffuse narrowing x 50% of LUL beyond lingula> adenoca - PET 11/03/14 1. The dominant left upper lobe mass and extensive adenopathy involving th  . Non-small cell carcinoma of lung, stage 4 ([place]) 11/12/2014  . Hypoalbuminemia due to protein-calorie malnutrition ([place]) 12/02/2014  . Hyponatremia 12/10/2014  . Hyperphosphatemia 12/02/2014  .  Bone metastasis ([place]) 11/25/2014  . S/P radiation therapy 12/07/14-01/18/15    left lung/lateral rib /rt hip  . Diabetes mellitus without complication ([place])   . Encounter for antineoplastic chemotherapy 03/09/2015    Past Surgical History  Procedure Laterality Date  . Video bronchoscopy Bilateral 10/23/2014    Procedure: VIDEO BRONCHOSCOPY WITHOUT FLUORO;  Surgeon: Tanda Rockers, MD;  Location: [facility];  Service: Cardiopulmonary;  Laterality: Bilateral;  . Subxyphoid pericardial window N/A 12/11/2014    Procedure: SUBXYPHOID PERICARDIAL WINDOW;  Surgeon: Rexene Alberts, MD;  Location: [facility];  Service: Thoracic;  Laterality: N/A;  . Tee without cardioversion N/A 12/11/2014    Procedure: TRANSESOPHAGEAL ECHOCARDIOGRAM (TEE);  Surgeon: Rexene Alberts, MD;  Location: [facility];  Service: Thoracic;  Laterality: N/A;       History of present illness and  Hospital Course:     Kindly see H&P for history of present illness and admission details, please review complete Labs, Consult reports and Test reports for all details in brief  HPI  from the history and physical done on the day  of admission 05/08/2015 Steven Roberts is an 50 y.o. 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 Julien Nordmann, disease complicated by cardiac tamponade s/p subxiphoid window/right pleural effusion s/p right chest tube. He has received palliative XRT to the left lung/left lateral hip and right hip, which completed 01/18/15. He is currently receiving Tarceva, And has been on this for the past 6 months. Patient has been seen by the oncology department on a fairly regular basis with complaints of progressive, chronic cough. His cough is mostly dry, but occasionally productive of yellow secretions. Has cold intolerance and chills at times. A restaging CT of his chest, abdomen and pelvis was done 03/02/15 and showed improvement in his lung cancer. He has  completed a course of antibiotic therapy with Levaquin and 2 rounds of steroids which have not significantly improved his symptoms, although there was a slight improvement after the first course and he was subsequently maintained on a maintenance dose of prednisone 10 mg daily. The right-sided pleuritic-type chest pain has been increasingly bad over the past week. Upon initial evaluation in the ED, the patient was noted to have findings concerning for multifocal pneumonia on chest radiography. A CT scan was subsequently obtained which was negative for pulmonary embolism, but did show findings with possible significant progression of lung cancer versus interstitial lung disease which can be a side effect of Tarceva. He was noted to drop his oxygen saturations into the mid 80s when ambulating. He is not on home oxygen. Discussed the case with Dr. Julien Nordmann who recommends discontinuation of Tarceva and who will see him on 05/10/15.    Hospital Course   Pleuritic chest pain secondary to right pleural effusion in the setting of stage IV lung cancer - Source of pain is likely from malignant pleural effusion but postobstructive pneumonia in the differential. - Underwent 580 mL thoracentesis 05/09/15. Pleural fluid LDH 166, WBC 1310, 53% neutrophils. Fluid culture with no growth to date. - Treated with IV vancomycin and cefepime during hospital stay as well IV Solu-Medrol, patient appears to be clinically improving, afebrile, no further dyspnea, tolerating room air, will transition him to by mouth levofloxacin for another 5 days as an outpatient as discussed with Dr. Inda Merlin, and we'll discharge on Medrol dose. - Patient ambulated in the hallway with no recurrence of hypoxia   Primary cancer of left upper lobe of lung ([place]) - Discussed with Dr. Earlie Server on discharge, will continue to [place] on discharge .   Bone metastasis ([place]) - Continue home medication regimen on discharge   Hypoalbuminemia due  to protein-calorie malnutrition [facility]) - Dietitian consultation.   Diabetes mellitus type 2 in nonobese ([facility]) - Resume home medication on discharge   CAP (community acquired pneumonia) - At risk for resistant organisms given immunosuppression and treatment at the cancer center. - Blood culture with no growth to date, treated with IV vancomycin and cefepime during hospital stay, we'll discharge on total of 5 days of oral levofloxacin    Discharge Condition: stable  Follow UP  Follow-up Information    Follow up with [facility] K., MD. Schedule an appointment as soon as possible for a visit in 1 week.   Specialty:  Oncology   Contact information:   [place] [address] [phone]         Discharge Instructions  and  Discharge Medications     Discharge Instructions    Discharge instructions  Complete by:  As directed   Follow with oncology Dr. Earlie Server in 1 week  Get CBC, CMP, 2 view Chest X ray checked  by Primary MD next visit.    Activity: As tolerated with Full fall precautions use walker/cane & assistance as needed   Disposition Home    Diet: Heart Healthy , carbohydrate modified , with feeding assistance and aspiration precautions.  For Heart failure patients - Check your Weight same time everyday, if you gain over 2 pounds, or you develop in leg swelling, experience more shortness of breath or chest pain, call your Primary MD immediately. Follow Cardiac Low Salt Diet and 1.5 lit/day fluid restriction.   On your next visit with your primary care physician please Get Medicines reviewed and adjusted.   Please request your Prim.MD to go over all Hospital Tests and Procedure/Radiological results at the follow up, please get all Hospital records sent to your Prim MD by signing hospital release before you go home.   If you experience worsening of your admission symptoms, develop shortness of breath, life threatening emergency, suicidal  or homicidal thoughts you must seek medical attention immediately by calling 911 or calling your MD immediately  if symptoms less severe.  You Must read complete instructions/literature along with all the possible adverse reactions/side effects for all the Medicines you take and that have been prescribed to you. Take any new Medicines after you have completely understood and accpet all the possible adverse reactions/side effects.   Do not drive, operating heavy machinery, perform activities at heights, swimming or participation in water activities or provide baby sitting services if your were admitted for syncope or siezures until you have seen by Primary MD or a Neurologist and advised to do so again.  Do not drive when taking Pain medications.    Do not take more than prescribed Pain, Sleep and Anxiety Medications  Special Instructions: If you have smoked or chewed Tobacco  in the last 2 yrs please stop smoking, stop any regular Alcohol  and or any Recreational drug use.  Wear Seat belts while driving.   Please note  You were cared for by a hospitalist during your hospital stay. If you have any questions about your discharge medications or the care you received while you were in the hospital after you are discharged, you can call the unit and asked to speak with the hospitalist on call if the hospitalist that took care of you is not available. Once you are discharged, your primary care physician will handle any further medical issues. Please note that NO REFILLS for any discharge medications will be authorized once you are discharged, as it is imperative that you return to your primary care physician (or establish a relationship with a primary care physician if you do not have one) for your aftercare needs so that they can reassess your need for medications and monitor your lab values.     Increase activity slowly    Complete by:  As directed             Medication List    STOP taking  these medications        erlotinib 150 MG tablet  Commonly known as:  TARCEVA      TAKE these medications        ADVIL 200 MG tablet  Generic drug:  ibuprofen  Take 400 mg by mouth every 6 (six) hours as needed for mild pain. Reported on 03/09/2015     benzonatate 200  MG capsule  Commonly known as:  TESSALON  Take 1 capsule (200 mg total) by mouth 3 (three) times daily as needed for cough.     fentaNYL 25 MCG/HR patch  Commonly known as:  DURAGESIC - dosed mcg/hr  Place 1 patch (25 mcg total) onto the skin every 3 (three) days.     glimepiride 2 MG tablet  Commonly known as:  AMARYL  Take 1 tablet (2 mg total) by mouth daily with breakfast.     HYDROcodone-acetaminophen 5-325 MG tablet  Commonly known as:  NORCO/VICODIN  Take 1-2 tablets by mouth every 6 (six) hours as needed for moderate pain.     HYDROcodone-homatropine 5-1.5 MG/5ML syrup  Commonly known as:  HYCODAN  Take 5 mLs by mouth every 6 (six) hours as needed for cough.     levofloxacin 750 MG tablet  Commonly known as:  LEVAQUIN  Take 1 tablet (750 mg total) by mouth daily.     methylPREDNISolone 4 MG Tbpk tablet  Commonly known as:  MEDROL DOSEPAK  Use as instructed          Diet and Activity recommendation: See Discharge Instructions above   Consults obtained -  Discussed  with oncology via phone   Major procedures and Radiology Reports - PLEASE review detailed and final reports for all details, in brief -      Dg Chest 1 View  05/09/2015  CLINICAL DATA:  Post right-sided thoracentesis EXAM: CHEST 1 VIEW COMPARISON:  None. FINDINGS: Grossly unchanged cardiac silhouette and mediastinal contours with partial obscuration of the left heart border secondary to all grossly unchanged extensive left mid lung heterogeneous airspace opacities. There is diffuse nodular thickening of the pulmonary interstitium primarily about the right perihilar lung. No change to slight reduction of right-sided pleural  effusion post thoracentesis parallel thorax. Unchanged suspected trace left-sided pleural effusion. Unchanged bones. IMPRESSION: 1. No change to slight reduction in trace right-sided effusion post thoracentesis. No pneumothorax. 2. Similar findings again worrisome for extensive bilateral metastatic disease though note, underlying infection is not excluded. Electronically Signed   By: Sandi Mariscal M.D.   On: 05/09/2015 12:23   Dg Chest 2 View  05/08/2015  CLINICAL DATA:  50 year old male with a history of right-sided rib pain. EXAM: CHEST - 2 VIEW COMPARISON:  03/30/2015, 03/02/2015, prior chest x-ray 01/04/2015 FINDINGS: Cardiomediastinal silhouette unchanged in size and contour. Over the comparison chest x-ray and CT studies, the patient has had improved in worsening of bilateral airspace/interstitial opacities. The current pattern of disease is worst in the most recent chest x-ray of 01/04/2015, and compared across modalities to the CT 03/30/2015. There are persisted interstitial opacities in the left greater than right mid lung and at the base on the lateral view. Unremarkable appearance of the upper abdomen. IMPRESSION: Multifocal, left greater than right airspace disease, compatible with multifocal infection. The patient has had waxing and waning disease over the course of the comparison plain film and CT. Followup PA and lateral chest X-ray is recommended in 3-4 weeks following trial of antibiotic therapy to ensure resolution and exclude underlying malignancy. Signed, Dulcy Fanny. Earleen Newport, DO [facility] Radiology Electronically Signed   By: Corrie Mckusick D.O.   On: 05/08/2015 09:58   Ct Angio Chest Pe W/cm &/or Wo Cm  05/08/2015  ADDENDUM REPORT: 05/08/2015 11:28 ADDENDUM: To be more specific, if this does represent progression of metastatic disease, the pattern of spread is more likely a combination of lymphangitis and  endobronchial in nature. Additionally, in  the appropriate clinical setting, a multi lobar pneumonia or atypical/fungal infectious process could have a similar radiographic appearance. Bronchoscopy may be helpful for further evaluation. Electronically Signed   By: Jacqulynn Cadet M.D.   On: 05/08/2015 11:28  05/08/2015  CLINICAL DATA:  50 year old male with right-sided rib pain and cough for the past week or longer. Patient completed left thoracic radiation therapy for metastatic non-small cell lung cancer in December of 2016. Patient also has a history of cardiac tamponade requiring subxiphoid pericardial window and right-sided chest tube placement EXAM: CT ANGIOGRAPHY CHEST WITH CONTRAST TECHNIQUE: Multidetector CT imaging of the chest was performed using the standard protocol during bolus administration of intravenous contrast. Multiplanar CT image reconstructions and MIPs were obtained to evaluate the vascular anatomy. CONTRAST:  100 mL Isovue 370 COMPARISON:  Most recent prior chest CT 03/30/2015 FINDINGS: Mediastinum: Similar appearance of left supraclavicular adenopathy. The largest nodal conglomerate measures approximately 2.8 x 1.8 cm which may be slightly smaller than previously measured at 3.3 x 2.4 cm. No definite mediastinal mass or adenopathy. Unremarkable thoracic esophagus. Heart/Vascular: Adequate opacification of the pulmonary arteries to the proximal subsegmental level. No evidence of acute pulmonary embolus. The heart is normal in size. No pericardial effusion. Normal caliber thoracic aorta. Lungs/Pleura: Moderate layering right-sided pleural effusion is new compared to February of 2017. Significant interval progression of metastatic disease bilaterally. The dominant nodule in the left upper lobe now measures 1.1 x 1.4 cm compared to 0.9 x 1.2 cm. Additionally, there has been extensive progression of diffuse bronchial wall thickening and tree-in-bud micro and macro nodularity throughout the right upper lung concerning for progressive  lymphangitis spread of the disease. There is new focal segmental atelectasis in the posterior aspect of the right middle lobe secondary to bronchial obstruction. Additionally, there is been significant interval progression of consolidation, volume loss and architectural distortion in the left upper and lower lobes in the region of prior radiation therapy. 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 Innumerable small pulmonary nodules throughout the left lower lobe are more prominent and greater in number. Bones/Soft Tissues: Stable blastic metastases in the T1 and T2 vertebral bodies without significant interval change. Upper Abdomen: Stable early enhancing lesion in hepatic segment 3 likely representative of a hemangioma. Relatively circumscribed low-attenuation lesion in the caudate is stable at 1.8 cm. Review of the MIP images confirms the above findings. IMPRESSION: 1. Negative for acute pulmonary embolus. 2. CT findings suggest significant interval progression of widespread pulmonary metastatic disease with probable diffuse lymphangitic spread of tumor throughout the right upper and middle lobes. Additionally, there is increasing extensive nodularity in the left lower lobe concerning for additional foci of metastatic disease. Enlarging left upper lobe metastatic nodule. 3. Interval reaccumulation of a moderate layering right-sided pleural effusion which is likely malignant. 4. Progressive consolidation, volume loss and architectural distortion in the left upper and lower lobes in the region of prior radiation therapy. 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 5. Stable T1 and T2 blastic osseous metastatic disease. No definite new bony lesion or evidence of pathologic fracture. 6. Slightly decreased left supraclavicular adenopathy. 7. Stable liver lesions 1 of which is almost certainly a hemangioma. Electronically Signed: By: Jacqulynn Cadet M.D. On: 05/08/2015 11:04   Dg Chest Port 1  View  05/11/2015  CLINICAL DATA:  Pleuritic chest pain, shortness of breath and weakness. Known left upper lobe lung cancer with bone metastases. EXAM: PORTABLE CHEST 1 VIEW COMPARISON:  05/09/2015, 05/08/2015 and CT  05/08/2015 FINDINGS: Exam demonstrates low lung volumes with continued focal opacification of the left midlung. Slight improved aeration over the right infrahilar region. Continued nodule prominence of the interstitium bilaterally with possible slight improvement. No significant effusion. Cardiomediastinal silhouette and remainder of the exam is unchanged. IMPRESSION: Stable opacification of the left midlung with slight improved aeration over the right infrahilar opacification. Slight improved nodular prominence of the interstitium bilaterally. Findings likely due to patient's underlying pulmonary neoplasm, although superimposed infection is not excluded. Electronically Signed   By: Marin Olp M.D.   On: 05/11/2015 11:08   US Thoracentesis Asp Pleural Space W/img Guide  05/09/2015  INDICATION: Right sided rib pain and cough. History of metastatic non-small cell lung cancer. Request for diagnostic and therapeutic thoracentesis. EXAM: ULTRASOUND GUIDED RIGHT THORACENTESIS MEDICATIONS: 1% Lidocaine. COMPLICATIONS: None immediate. PROCEDURE: An ultrasound guided thoracentesis was thoroughly discussed with the patient and questions answered. The benefits, risks, alternatives and complications were also discussed. The patient understands and wishes to proceed with the procedure. Written consent was obtained. Ultrasound was performed to localize and mark an adequate pocket of fluid in the right chest. The area was then prepped and draped in the normal sterile fashion. 1% Lidocaine was used for local anesthesia. Under ultrasound guidance a 6 Fr Safe-T-Centesis catheter was introduced. Thoracentesis was performed. The catheter was removed and a dressing applied. FINDINGS: A total of approximately 580 ml  of clear yellow fluid was removed. Samples were sent to the laboratory as requested by the clinical team. IMPRESSION: Successful ultrasound guided right thoracentesis yielding 580 ml of pleural fluid. Read by:  Gareth Eagle, PA-C Electronically Signed   By: Sandi Mariscal M.D.   On: 05/09/2015 11:54    Micro Results     Recent Results (from the past 240 hour(s))  Culture, blood (routine x 2) Call MD if unable to obtain prior to antibiotics being given     Status: None (Preliminary result)   Collection Time: 05/08/15  1:00 PM  Result Value Ref Range Status   Specimen Description BLOOD  RIGHT ARM  Final   Special Requests  10 ML BAA  Final   Culture   Final    NO GROWTH 4 DAYS Performed at [facility]    Report Status PENDING  Incomplete  Culture, blood (routine x 2) Call MD if unable to obtain prior to antibiotics being given     Status: None (Preliminary result)   Collection Time: 05/08/15  1:10 PM  Result Value Ref Range Status   Specimen Description BLOOD  LEFT BAC  Final   Special Requests  10 ML AEB  Final   Culture   Final    NO GROWTH 4 DAYS Performed at [facility]    Report Status PENDING  Incomplete  Body fluid culture     Status: None (Preliminary result)   Collection Time: 05/09/15 11:58 AM  Result Value Ref Range Status   Specimen Description Pleural R  Final   Special Requests NONE  Final   Gram Stain   Final    CYTOSPIN SLIDE WBC PRESENT,BOTH PMN AND MONONUCLEAR NO ORGANISMS SEEN    Culture   Final    NO GROWTH 2 DAYS Performed at [facility]    Report Status PENDING  Incomplete       Today   Subjective:   Steven Roberts today has no headache,no  abdominal pain, worse pleuritic chest pain significantly improved, complaints of dry cough, nonproductive, was able to  ambulatory in the hallway with no hypoxia.   Objective:   Blood pressure 129/70, pulse 78, temperature 98 F (36.7 C), temperature source Oral, resp. rate 18, height 5'  7" (1.702 m), weight 79.289 kg (174 lb 12.8 oz), SpO2 96 %.   Intake/Output Summary (Last 24 hours) at 05/12/15 1312 Last data filed at 05/12/15 0919  Gross per 24 hour  Intake    780 ml  Output   2150 ml  Net  -1370 ml    Exam Gen: NAD Cardiovascular: RRR, No M/R/G Respiratory: Lungs clear, no wheezing Gastrointestinal: Abdomen soft, NT/ND, + BS Extremities: No C/E/C  Data Review   CBC w Diff: Lab Results  Component Value Date   WBC 10.0 05/08/2015   WBC 7.9 04/13/2015   HGB 11.6* 05/08/2015   HGB 12.8* 04/13/2015   HCT 34.8* 05/08/2015   HCT 39.3 04/13/2015   PLT 470* 05/08/2015   PLT 336 04/13/2015   LYMPHOPCT 6 05/08/2015   LYMPHOPCT 5.8* 04/13/2015   MONOPCT 10 05/08/2015   MONOPCT 6.7 04/13/2015   EOSPCT 2 05/08/2015   EOSPCT 5.3 04/13/2015   BASOPCT 0 05/08/2015   BASOPCT 0.5 04/13/2015    CMP: Lab Results  Component Value Date   NA 139 05/08/2015   NA 137 04/13/2015   K 4.0 05/08/2015   K 4.2 04/13/2015   CL 104 05/08/2015   CO2 27 05/08/2015   CO2 27 04/13/2015   BUN 11 05/08/2015   BUN 8.6 04/13/2015   CREATININE 0.64 05/08/2015   CREATININE 0.8 04/13/2015   PROT 6.7 05/08/2015   PROT 6.8 04/13/2015   ALBUMIN 3.0* 05/08/2015   ALBUMIN 2.9* 04/13/2015   BILITOT 0.5 05/08/2015   BILITOT 0.76 04/13/2015   ALKPHOS 142* 05/08/2015   ALKPHOS 151* 04/13/2015   AST 22 05/08/2015   AST 18 04/13/2015   ALT 33 05/08/2015   ALT 37 04/13/2015  .   Total Time in preparing paper work, data evaluation and todays exam - 35 minutes  Kenith Trickel M.D on 05/12/2015 at 1:12 PM  [facility]   Office  [phone]

## 2015-05-12 NOTE — Discharge Instructions (Signed)
Follow with oncology Dr. Earlie Server in 1 week  Get CBC, CMP, 2 view Chest X ray checked  by Primary MD next visit.    Activity: As tolerated with Full fall precautions use walker/cane & assistance as needed   Disposition Home    Diet: Heart Healthy  , with feeding assistance and aspiration precautions.  For Heart failure patients - Check your Weight same time everyday, if you gain over 2 pounds, or you develop in leg swelling, experience more shortness of breath or chest pain, call your Primary MD immediately. Follow Cardiac Low Salt Diet and 1.5 lit/day fluid restriction.   On your next visit with your primary care physician please Get Medicines reviewed and adjusted.   Please request your Prim.MD to go over all Hospital Tests and Procedure/Radiological results at the follow up, please get all Hospital records sent to your Prim MD by signing hospital release before you go home.   If you experience worsening of your admission symptoms, develop shortness of breath, life threatening emergency, suicidal or homicidal thoughts you must seek medical attention immediately by calling 911 or calling your MD immediately  if symptoms less severe.  You Must read complete instructions/literature along with all the possible adverse reactions/side effects for all the Medicines you take and that have been prescribed to you. Take any new Medicines after you have completely understood and accpet all the possible adverse reactions/side effects.   Do not drive, operating heavy machinery, perform activities at heights, swimming or participation in water activities or provide baby sitting services if your were admitted for syncope or siezures until you have seen by Primary MD or a Neurologist and advised to do so again.  Do not drive when taking Pain medications.    Do not take more than prescribed Pain, Sleep and Anxiety Medications  Special Instructions: If you have smoked or chewed Tobacco  in the last 2  yrs please stop smoking, stop any regular Alcohol  and or any Recreational drug use.  Wear Seat belts while driving.   Please note  You were cared for by a hospitalist during your hospital stay. If you have any questions about your discharge medications or the care you received while you were in the hospital after you are discharged, you can call the unit and asked to speak with the hospitalist on call if the hospitalist that took care of you is not available. Once you are discharged, your primary care physician will handle any further medical issues. Please note that NO REFILLS for any discharge medications will be authorized once you are discharged, as it is imperative that you return to your primary care physician (or establish a relationship with a primary care physician if you do not have one) for your aftercare needs so that they can reassess your need for medications and monitor your lab values.

## 2015-05-12 NOTE — Progress Notes (Signed)
Discharge instructions given. Pt verbalized understanding and all questions were answered.  

## 2015-05-13 LAB — CULTURE, BLOOD (ROUTINE X 2)
CULTURE: NO GROWTH
Culture: NO GROWTH
Special Requests: 10

## 2015-05-13 LAB — BODY FLUID CULTURE: Culture: NO GROWTH

## 2015-05-14 ENCOUNTER — Telehealth: Payer: Self-pay | Admitting: Internal Medicine

## 2015-05-14 NOTE — Telephone Encounter (Signed)
pt called to sched 1week HFU gv pt 4.11 appt emailed MD to confirm 2week appt. will call pt with MD response.

## 2015-05-18 ENCOUNTER — Other Ambulatory Visit: Payer: Self-pay | Admitting: *Deleted

## 2015-05-18 DIAGNOSIS — C7951 Secondary malignant neoplasm of bone: Secondary | ICD-10-CM

## 2015-05-18 DIAGNOSIS — E46 Unspecified protein-calorie malnutrition: Secondary | ICD-10-CM

## 2015-05-18 DIAGNOSIS — C3412 Malignant neoplasm of upper lobe, left bronchus or lung: Secondary | ICD-10-CM

## 2015-05-18 DIAGNOSIS — E8809 Other disorders of plasma-protein metabolism, not elsewhere classified: Secondary | ICD-10-CM

## 2015-05-18 MED ORDER — ERLOTINIB HCL 150 MG PO TABS: 150.0000 mg | ORAL_TABLET | Freq: Every day | ORAL | Status: DC

## 2015-05-18 NOTE — Telephone Encounter (Signed)
Tarceva Rx faxed to [place] pt to see MD 4/11

## 2015-05-20 ENCOUNTER — Ambulatory Visit: Payer: Self-pay

## 2015-05-20 ENCOUNTER — Other Ambulatory Visit (HOSPITAL_BASED_OUTPATIENT_CLINIC_OR_DEPARTMENT_OTHER): Payer: Self-pay

## 2015-05-20 ENCOUNTER — Telehealth: Payer: Self-pay | Admitting: *Deleted

## 2015-05-20 ENCOUNTER — Ambulatory Visit (HOSPITAL_BASED_OUTPATIENT_CLINIC_OR_DEPARTMENT_OTHER): Payer: Self-pay | Admitting: Nurse Practitioner

## 2015-05-20 ENCOUNTER — Telehealth: Payer: Self-pay | Admitting: Internal Medicine

## 2015-05-20 VITALS — BP 102/60 | HR 98 | Temp 97.7°F | Resp 20 | Ht 67.0 in | Wt 157.8 lb

## 2015-05-20 DIAGNOSIS — R05 Cough: Secondary | ICD-10-CM

## 2015-05-20 DIAGNOSIS — G893 Neoplasm related pain (acute) (chronic): Secondary | ICD-10-CM

## 2015-05-20 DIAGNOSIS — C3412 Malignant neoplasm of upper lobe, left bronchus or lung: Secondary | ICD-10-CM

## 2015-05-20 DIAGNOSIS — E860 Dehydration: Secondary | ICD-10-CM

## 2015-05-20 DIAGNOSIS — C7951 Secondary malignant neoplasm of bone: Secondary | ICD-10-CM

## 2015-05-20 DIAGNOSIS — E8809 Other disorders of plasma-protein metabolism, not elsewhere classified: Secondary | ICD-10-CM

## 2015-05-20 DIAGNOSIS — E46 Unspecified protein-calorie malnutrition: Secondary | ICD-10-CM

## 2015-05-20 LAB — CBC WITH DIFFERENTIAL/PLATELET
BASO%: 0.1 % (ref 0.0–2.0)
BASOS ABS: 0 10*3/uL (ref 0.0–0.1)
EOS ABS: 0.6 10*3/uL — AB (ref 0.0–0.5)
EOS%: 3.1 % (ref 0.0–7.0)
HEMATOCRIT: 38.1 % — AB (ref 38.4–49.9)
HGB: 12.7 g/dL — ABNORMAL LOW (ref 13.0–17.1)
LYMPH#: 1.5 10*3/uL (ref 0.9–3.3)
LYMPH%: 8.1 % — ABNORMAL LOW (ref 14.0–49.0)
MCH: 27.9 pg (ref 27.2–33.4)
MCHC: 33.3 g/dL (ref 32.0–36.0)
MCV: 83.6 fL (ref 79.3–98.0)
MONO#: 0.4 10*3/uL (ref 0.1–0.9)
MONO%: 2.1 % (ref 0.0–14.0)
NEUT#: 15.6 10*3/uL — ABNORMAL HIGH (ref 1.5–6.5)
NEUT%: 86.6 % — AB (ref 39.0–75.0)
PLATELETS: 459 10*3/uL — AB (ref 140–400)
RBC: 4.56 10*6/uL (ref 4.20–5.82)
RDW: 14.2 % (ref 11.0–14.6)
WBC: 18 10*3/uL — ABNORMAL HIGH (ref 4.0–10.3)

## 2015-05-20 LAB — COMPREHENSIVE METABOLIC PANEL
ALK PHOS: 130 U/L (ref 40–150)
ALT: 46 U/L (ref 0–55)
ANION GAP: 7 meq/L (ref 3–11)
AST: 18 U/L (ref 5–34)
Albumin: 2.4 g/dL — ABNORMAL LOW (ref 3.5–5.0)
BUN: 15.7 mg/dL (ref 7.0–26.0)
CALCIUM: 9.2 mg/dL (ref 8.4–10.4)
CHLORIDE: 99 meq/L (ref 98–109)
CO2: 27 mEq/L (ref 22–29)
Creatinine: 0.8 mg/dL (ref 0.7–1.3)
EGFR: >90 mL/min/{1.73_m2} (ref >90)
Glucose: 276 mg/dl — ABNORMAL HIGH (ref 70–140)
POTASSIUM: 3.9 meq/L (ref 3.5–5.1)
Sodium: 133 mEq/L — ABNORMAL LOW (ref 136–145)
Total Bilirubin: 0.57 mg/dL (ref 0.20–1.20)
Total Protein: 6.4 g/dL (ref 6.4–8.3)

## 2015-05-20 MED ORDER — METHYLPREDNISOLONE 4 MG PO TBPK: ORAL_TABLET | ORAL | Status: DC

## 2015-05-20 MED ORDER — HYDROCODONE-ACETAMINOPHEN 5-325 MG PO TABS: 1.0000 | ORAL_TABLET | Freq: Four times a day (QID) | ORAL | Status: DC | PRN

## 2015-05-20 MED ORDER — FENTANYL 50 MCG/HR TD PT72: 50.0000 ug | MEDICATED_PATCH | TRANSDERMAL | Status: DC

## 2015-05-20 MED ORDER — HYDROCOD POLST-CPM POLST ER 10-8 MG/5ML PO SUER: 5.0000 mL | Freq: Two times a day (BID) | ORAL | Status: DC | PRN

## 2015-05-20 MED FILL — HYDROCODON-APAP 5-325: 5-325 | 5 days supply | Qty: 45 | Fill #0

## 2015-05-20 MED FILL — HYDROCODONE-CHLORPHENIRAM S: 10-8 | 14 days supply | Qty: 140 | Fill #0

## 2015-05-20 MED FILL — fentaNYL 50 MCG/HR PT72: 50 | 15 days supply | Qty: 5 | Fill #0

## 2015-05-20 NOTE — Telephone Encounter (Signed)
"  I need to know if Dr. Julien Nordmann can see my husband today.  He still has the same strong pain he had before admission and fluid drained (thoracentesis).  Wears fentanyl patch and takes the hydrocodone 5-325 mg 1 to 2 pills every five to six hours." Dr, Nicanor Alcon notified.  Verbal order received and read back to bring him in today for [facility].  No radiology needed at this time.

## 2015-05-20 NOTE — Telephone Encounter (Signed)
per pf to sch pt appt-sent Dr Julien Nordmann email to adv when to sch appt-adv pt will call after reply

## 2015-05-24 ENCOUNTER — Encounter: Payer: Self-pay | Admitting: Nurse Practitioner

## 2015-05-24 ENCOUNTER — Telehealth: Payer: Self-pay | Admitting: Internal Medicine

## 2015-05-24 ENCOUNTER — Telehealth: Payer: Self-pay

## 2015-05-24 DIAGNOSIS — E860 Dehydration: Secondary | ICD-10-CM | POA: Insufficient documentation

## 2015-05-24 NOTE — Assessment & Plan Note (Signed)
Patient was mildly dehydrated today; but refused IV fluid rehydration.  He prefers to push fluids at home instead.

## 2015-05-24 NOTE — Telephone Encounter (Signed)
per pof to r/s pt appt-cld & spoke to pt time & date for appt

## 2015-05-24 NOTE — Assessment & Plan Note (Signed)
Patient has been holding his [place] therapy since been admitted to the hospital on 05/08/2015 with diagnosis of pneumonia.  Patient was discharged from the hospital on 05/12/2015.  Patient presented to the [place] today with complaint of increased right chest wall pain that is radiating to his bilateral shoulders now.  See further note for details.  Labs obtained today revealed leukocytosis; most likely secondary to steroid-induced.  Dr. Julien Nordmann has ordered a blood EGFR mutation lab; with results pending.  Will cancel 05/25/2015 appointment; and reschedule patient for labs and a visit with Dr. Julien Nordmann on 06/03/2015.  Instead.

## 2015-05-24 NOTE — Progress Notes (Signed)
SYMPTOM MANAGEMENT CLINIC    Chief Complaint: Pain   HPI:  Steven Roberts 50 y.o. male diagnosed with lung cancer with bone metastasis.  Holding Tarceva oral therapy since recent diagnosis of pneumonia.   Patient has known bone metastasis; and suffers with chronic pain.  Patient reports that his pain to his right chest wall.  Continue; he is now experiencing some additional discomfort to the tops of his bilateral shoulders.  He denies any active chest pain, chest pressure, or pain with inspiration.  Breasts sounds essentially clear bilaterally with no cough, wheeze, or obvious shortness of breath.  O2 sat was 95% on room air.  Patient was afebrile.  After reviewing all findings with Dr. Mohamed-decision was made to increase patient's fentanyl patch from 25 g up to 50 g.  We will also switch patient from the Hycodan cough syrup to Tussionex cough syrup.  Will also try a repeat Medrol Dosepak to see if that helps.  All prescription changes and instructions were printed out for the patient.  Patient was advised to call/return of her directly to the emergency department for any worsening symptoms whatsoever.   No history exists.    Review of Systems  Constitutional: Positive for malaise/fatigue. Negative for fever and chills.  Respiratory: Negative for cough, hemoptysis, sputum production, shortness of breath and wheezing.   Musculoskeletal:       Complaining of chronic right chest wall pain.  Also now complaining of bilateral shoulder pain as well.  All other systems reviewed and are negative.   Past Medical History  Diagnosis Date  . Bone cancer ([place]) 11/03/14 Pet scan    left 6th rib, right hip  . Lung cancer ([place]) 10/23/14    LUL adenocarcinoma, non-small cell  . Pericardial effusion with cardiac tamponade 12/11/2014  . Pulmonary infiltrates  c/w Adenoca/ Stage IV  10/13/2014    - symptom onset march 2016 - Baseline cxr on July 20 2014 reported to be nl  > then CT chest 10/13/2014  LUL as dz  Assoc with   LUL nodular density/ mild adenopathy - Quantiferon Gold 10/13/2014 > neg  - FOB 10/23/14 > cobblestoning beyond Lingular orifice with diffuse narrowing x 50% of LUL beyond lingula> adenoca - PET 11/03/14 1. The dominant left upper lobe mass and extensive adenopathy involving th  . Non-small cell carcinoma of lung, stage 4 ([place]) 11/12/2014  . Hypoalbuminemia due to protein-calorie malnutrition ([place]) 12/02/2014  . Hyponatremia 12/10/2014  . Hyperphosphatemia 12/02/2014  . Bone metastasis ([place]) 11/25/2014  . S/P radiation therapy 12/07/14-01/18/15    left lung/lateral rib /rt hip  . Diabetes mellitus without complication ([place])   . Encounter for antineoplastic chemotherapy 03/09/2015    Past Surgical History  Procedure Laterality Date  . Video bronchoscopy Bilateral 10/23/2014    Procedure: VIDEO BRONCHOSCOPY WITHOUT FLUORO;  Surgeon: Tanda Rockers, MD;  Location: [facility];  Service: Cardiopulmonary;  Laterality: Bilateral;  . Subxyphoid pericardial window N/A 12/11/2014    Procedure: SUBXYPHOID PERICARDIAL WINDOW;  Surgeon: Rexene Alberts, MD;  Location: [place];  Service: Thoracic;  Laterality: N/A;  . Tee without cardioversion N/A 12/11/2014    Procedure: TRANSESOPHAGEAL ECHOCARDIOGRAM (TEE);  Surgeon: Rexene Alberts, MD;  Location: [place];  Service: Thoracic;  Laterality: N/A;    has Pulmonary infiltrates  c/w Adenoca/ Stage IV ; Primary cancer of left upper lobe of lung ([place]); Bone metastasis ([place]); Hyperglycemia; Hypoalbuminemia due to protein-calorie malnutrition ([place]); Pericardial effusion with cardiac tamponade; Diabetes mellitus type  2 in nonobese [facility]); Encounter for antineoplastic chemotherapy; CAP (community acquired pneumonia); Pleural effusion on right; Pleuritic chest pain; and Dehydration on his problem list.    has No Known Allergies.    Medication List       This list is accurate as of: 05/20/15 11:59 PM.  Always use your most recent med list.                ADVIL 200 MG tablet  Generic drug:  ibuprofen  Take 400 mg by mouth every 6 (six) hours as needed for mild pain. Reported on 03/09/2015     benzonatate 200 MG capsule  Commonly known as:  TESSALON  Take 1 capsule (200 mg total) by mouth 3 (three) times daily as needed for cough.     chlorpheniramine-HYDROcodone 10-8 MG/5ML [place]  Commonly known as:  TUSSIONEX  Take 5 mLs by mouth every 12 (twelve) hours as needed for cough.     erlotinib 150 MG tablet  Commonly known as:  TARCEVA  Take 1 tablet (150 mg total) by mouth daily. Take on an empty stomach 1 hour before meals or 2 hours after.     fentaNYL 50 MCG/HR  Commonly known as:  DURAGESIC - dosed mcg/hr  Place 1 patch (50 mcg total) onto the skin every 3 (three) days.     glimepiride 2 MG tablet  Commonly known as:  AMARYL  Take 1 tablet (2 mg total) by mouth daily with breakfast.     HYDROcodone-acetaminophen 5-325 MG tablet  Commonly known as:  NORCO/VICODIN  Take 1-2 tablets by mouth every 6 (six) hours as needed for moderate pain.     methylPREDNISolone 4 MG Tbpk tablet  Commonly known as:  MEDROL DOSEPAK  Medrol dose pak: take as directed.         PHYSICAL EXAMINATION  Oncology Vitals 05/20/2015 05/12/2015  Height 170 cm -  Weight 71.578 kg -  Weight (lbs) 157 lbs 13 oz -  BMI (kg/m2) 24.72 kg/m2 -  Temp 97.7 98.1  Pulse 98 -  Resp 20 18  SpO2 95 96  BSA (m2) 1.84 m2 -   BP Readings from Last 2 Encounters:  05/20/15 102/60  05/12/15 118/52    Physical Exam  Constitutional: He is oriented to person, place, and time. Vital signs are normal. He appears dehydrated. He appears unhealthy.  HENT:  Head: Normocephalic and atraumatic.  Mouth/Throat: Oropharynx is clear and moist.  Eyes: Conjunctivae and EOM are normal. Pupils are equal, round, and reactive to light. Right eye exhibits no discharge. Left eye exhibits no discharge. No scleral icterus.  Neck: Normal range of motion. Neck supple. No JVD present.  No tracheal deviation present. No thyromegaly present.  Cardiovascular: Normal rate, regular rhythm, normal heart sounds and intact distal pulses.   Pulmonary/Chest: Effort normal and breath sounds normal. No respiratory distress. He has no wheezes. He has no rales. He exhibits tenderness.  No obvious tenderness with palpation to the right chest wall; the patient continues complaining of low-grade chronic chest wall pain to the right.  Abdominal: Soft. Bowel sounds are normal. He exhibits no distension and no mass. There is no tenderness. There is no rebound and no guarding.  Musculoskeletal: Normal range of motion. He exhibits no edema or tenderness.  Lymphadenopathy:    He has no cervical adenopathy.  Neurological: He is alert and oriented to person, place, and time. Gait normal.  Skin: Skin is warm and dry. No rash noted. No erythema.  No pallor.  Psychiatric: Affect normal.    LABORATORY DATA:. Appointment on 05/20/2015  Component Date Value Ref Range Status  . WBC 05/20/2015 18.0* 4.0 - 10.3 10e3/uL Final  . NEUT# 05/20/2015 15.6* 1.5 - 6.5 10e3/uL Final  . HGB 05/20/2015 12.7* 13.0 - 17.1 g/dL Final  . HCT 05/20/2015 38.1* 38.4 - 49.9 % Final  . Platelets 05/20/2015 459* 140 - 400 10e3/uL Final  . MCV 05/20/2015 83.6  79.3 - 98.0 fL Final  . MCH 05/20/2015 27.9  27.2 - 33.4 pg Final  . MCHC 05/20/2015 33.3  32.0 - 36.0 g/dL Final  . RBC 05/20/2015 4.56  4.20 - 5.82 10e6/uL Final  . RDW 05/20/2015 14.2  11.0 - 14.6 % Final  . lymph# 05/20/2015 1.5  0.9 - 3.3 10e3/uL Final  . MONO# 05/20/2015 0.4  0.1 - 0.9 10e3/uL Final  . Eosinophils Absolute 05/20/2015 0.6* 0.0 - 0.5 10e3/uL Final  . Basophils Absolute 05/20/2015 0.0  0.0 - 0.1 10e3/uL Final  . NEUT% 05/20/2015 86.6* 39.0 - 75.0 % Final  . LYMPH% 05/20/2015 8.1* 14.0 - 49.0 % Final  . MONO% 05/20/2015 2.1  0.0 - 14.0 % Final  . EOS% 05/20/2015 3.1  0.0 - 7.0 % Final  . BASO% 05/20/2015 0.1  0.0 - 2.0 % Final  . Sodium  05/20/2015 133* 136 - 145 mEq/L Final  . Potassium 05/20/2015 3.9  3.5 - 5.1 mEq/L Final  . Chloride 05/20/2015 99  98 - 109 mEq/L Final  . CO2 05/20/2015 27  22 - 29 mEq/L Final  . Glucose 05/20/2015 276* 70 - 140 mg/dl Final   Glucose reference range is for nonfasting patients. Fasting glucose reference range is 70- 100.  Marland Kitchen BUN 05/20/2015 15.7  7.0 - 26.0 mg/dL Final  . Creatinine 05/20/2015 0.8  0.7 - 1.3 mg/dL Final  . Total Bilirubin 05/20/2015 0.57  0.20 - 1.20 mg/dL Final  . Alkaline Phosphatase 05/20/2015 130  40 - 150 U/L Final  . AST 05/20/2015 18  5 - 34 U/L Final  . ALT 05/20/2015 46  0 - 55 U/L Final  . Total Protein 05/20/2015 6.4  6.4 - 8.3 g/dL Final  . Albumin 05/20/2015 2.4* 3.5 - 5.0 g/dL Final  . Calcium 05/20/2015 9.2  8.4 - 10.4 mg/dL Final  . Anion Gap 05/20/2015 7  3 - 11 mEq/L Final  . EGFR 05/20/2015 >90  >90 ml/min/1.73 m2 Final   eGFR is calculated using the CKD-EPI Creatinine Equation (2009)    RADIOGRAPHIC STUDIES: No results found.  ASSESSMENT/PLAN:    Primary cancer of left upper lobe of lung [facility]) Patient has been holding his [place] therapy since been admitted to the hospital on 05/08/2015 with diagnosis of pneumonia.  Patient was discharged from the hospital on 05/12/2015.  Patient presented to the [place] today with complaint of increased right chest wall pain that is radiating to his bilateral shoulders now.  See further note for details.  Labs obtained today revealed leukocytosis; most likely secondary to steroid-induced.  Dr. Julien Nordmann has ordered a blood EGFR mutation lab; with results pending.  Will cancel 05/25/2015 appointment; and reschedule patient for labs and a visit with Dr. Julien Nordmann on 06/03/2015.  Instead.    Bone metastasis ([place]) Patient has known bone metastasis; and suffers with chronic pain.  Patient reports that his pain to his right chest wall.  Continue; he is now experiencing some additional discomfort to the tops of his  bilateral shoulders.  He denies any  active chest pain, chest pressure, or pain with inspiration.  Breasts sounds essentially clear bilaterally with no cough, wheeze, or obvious shortness of breath.  O2 sat was 95% on room air.  Patient was afebrile.  After reviewing all findings with Dr. Mohamed-decision was made to increase patient's fentanyl patch from 25 g up to 50 g.  We will also switch patient from the Hycodan cough syrup to Tussionex cough syrup.  Will also try a repeat Medrol Dosepak to see if that helps.  All prescription changes and instructions were printed out for the patient.  Patient was advised to call/return of her directly to the emergency department for any worsening symptoms whatsoever.  Hypoalbuminemia due to protein-calorie malnutrition [facility]) Patient continues with a low albumen.  Patient was encouraged to push protein in his diet is much as possible.  We'll continue to monitor.  Dehydration Patient was mildly dehydrated today; but refused IV fluid rehydration.  He prefers to push fluids at home instead.  Patient stated understanding of all instructions; and was in agreement with this plan of care. The patient knows to call the clinic with any problems, questions or concerns.   This was a shared visit with Dr. Julien Nordmann today.  Total time spent with patient was 25 minutes;  with greater than 75 percent of that time spent in face to face counseling regarding patient's symptoms,  and coordination of care and follow up.  Disclaimer:This dictation was prepared with Dragon/digital dictation along with [employer]. Any transcriptional errors that result from this process are unintentional.  Drue Second, NP 05/24/2015   ADDENDUM: Hematology/Oncology Attending: I had a face to face encounter with the patient. I recommended his care plan. This is a very pleasant 50 years old Hispanic male with metastatic non-small cell lung cancer, adenocarcinoma with positive  EGFR mutation has been on treatment with Tarceva for several months. The patient was recently admitted to [facility] with shortness of breath as well as pain on the right side of the chest. CT angiogram of the chest on 05/08/2015 was negative for acute pulmonary embolism but there was CT finding suggesting significant interval progression of widespread pulmonary metastatic disease with diffuse lymphangitic spread of tumor throughout the right upper and middle lobes. There was also interval reaccumulation of moderate lowering right-sided pleural effusion and progressive consolidation. I saw the patient at that time and recommended for him to hold his treatment with Tarceva. He presented today with right-sided chest pain in addition to dry cough. I recommended for the patient to have blood test for EGFR mutation analysis to rule out the presence of resistant [id number] mutation. For pain management, he was given a refill of his pain medication. For the dry cough the patient was given new prescription for Tussionex. For dehydration, we recommended for the patient to receive IV hydration but he would like to increase his by mouth intake of fluid at home. I will see him back for follow-up visit in 1-2 week for evaluation and discussion of his treatment options based on the molecular studies. He was advised to call immediately if he has any concerning symptoms in the interval.  Disclaimer: This note was dictated with voice recognition software. Similar sounding words can inadvertently be transcribed and may be missed upon review. Eilleen Kempf., MD 05/24/2015

## 2015-05-24 NOTE — Assessment & Plan Note (Signed)
Patient has known bone metastasis; and suffers with chronic pain.  Patient reports that his pain to his right chest wall.  Continue; he is now experiencing some additional discomfort to the tops of his bilateral shoulders.  He denies any active chest pain, chest pressure, or pain with inspiration.  Breasts sounds essentially clear bilaterally with no cough, wheeze, or obvious shortness of breath.  O2 sat was 95% on room air.  Patient was afebrile.  After reviewing all findings with Dr. Mohamed-decision was made to increase patient's fentanyl patch from 25 g up to 50 g.  We will also switch patient from the Hycodan cough syrup to Tussionex cough syrup.  Will also try a repeat Medrol Dosepak to see if that helps.  All prescription changes and instructions were printed out for the patient.  Patient was advised to call/return of her directly to the emergency department for any worsening symptoms whatsoever.

## 2015-05-24 NOTE — Telephone Encounter (Signed)
Pain is still there but a little better. Wife will wake him up for meals. We discussed sedation from fentanyl and if she has trouble waking him up or if he is sleeping too much to call us and we will adjust his medication.  His cough is improved. She is making him drink pedialyte. Pt is currently sleeping. Told her to call if any needs before appt on 4/18. She was amenable to this plan.

## 2015-05-24 NOTE — Assessment & Plan Note (Signed)
Patient continues with a low albumen.  Patient was encouraged to push protein in his diet is much as possible.  We'll continue to monitor.

## 2015-05-25 ENCOUNTER — Ambulatory Visit: Payer: Self-pay | Admitting: Internal Medicine

## 2015-05-25 ENCOUNTER — Other Ambulatory Visit: Payer: Self-pay

## 2015-05-26 LAB — EPIDERMAL GROWTH FACTOR RECEPTOR (EGFR) MUTATION ANALYSIS

## 2015-05-31 ENCOUNTER — Emergency Department (HOSPITAL_COMMUNITY): Payer: Medicaid Other

## 2015-05-31 ENCOUNTER — Inpatient Hospital Stay (HOSPITAL_COMMUNITY)
Admission: EM | Admit: 2015-05-31 | Discharge: 2015-06-03 | DRG: 194 | Disposition: A | Discharge status: Home / Self Care | Payer: Medicaid Other | Attending: Internal Medicine | Admitting: Internal Medicine

## 2015-05-31 DIAGNOSIS — C3412 Malignant neoplasm of upper lobe, left bronchus or lung: Secondary | ICD-10-CM | POA: Diagnosis present

## 2015-05-31 DIAGNOSIS — K5900 Constipation, unspecified: Secondary | ICD-10-CM | POA: Diagnosis not present

## 2015-05-31 DIAGNOSIS — E860 Dehydration: Secondary | ICD-10-CM | POA: Diagnosis not present

## 2015-05-31 DIAGNOSIS — E119 Type 2 diabetes mellitus without complications: Secondary | ICD-10-CM | POA: Diagnosis not present

## 2015-05-31 DIAGNOSIS — D7589 Other specified diseases of blood and blood-forming organs: Secondary | ICD-10-CM | POA: Diagnosis not present

## 2015-05-31 DIAGNOSIS — J189 Pneumonia, unspecified organism: Principal | ICD-10-CM | POA: Diagnosis present

## 2015-05-31 DIAGNOSIS — Y95 Nosocomial condition: Secondary | ICD-10-CM | POA: Diagnosis present

## 2015-05-31 DIAGNOSIS — Z8042 Family history of malignant neoplasm of prostate: Secondary | ICD-10-CM | POA: Diagnosis not present

## 2015-05-31 DIAGNOSIS — D75839 Thrombocytosis, unspecified: Secondary | ICD-10-CM

## 2015-05-31 DIAGNOSIS — Z923 Personal history of irradiation: Secondary | ICD-10-CM | POA: Diagnosis not present

## 2015-05-31 DIAGNOSIS — Z85830 Personal history of malignant neoplasm of bone: Secondary | ICD-10-CM

## 2015-05-31 DIAGNOSIS — D473 Essential (hemorrhagic) thrombocythemia: Secondary | ICD-10-CM

## 2015-05-31 DIAGNOSIS — R000 Tachycardia, unspecified: Secondary | ICD-10-CM | POA: Diagnosis present

## 2015-05-31 DIAGNOSIS — R0602 Shortness of breath: Secondary | ICD-10-CM | POA: Diagnosis present

## 2015-05-31 DIAGNOSIS — E871 Hypo-osmolality and hyponatremia: Secondary | ICD-10-CM | POA: Diagnosis present

## 2015-05-31 DIAGNOSIS — Z79899 Other long term (current) drug therapy: Secondary | ICD-10-CM | POA: Diagnosis not present

## 2015-05-31 DIAGNOSIS — D649 Anemia, unspecified: Secondary | ICD-10-CM

## 2015-05-31 DIAGNOSIS — D6489 Other specified anemias: Secondary | ICD-10-CM | POA: Diagnosis not present

## 2015-05-31 DIAGNOSIS — C7951 Secondary malignant neoplasm of bone: Secondary | ICD-10-CM | POA: Diagnosis present

## 2015-05-31 DIAGNOSIS — Z833 Family history of diabetes mellitus: Secondary | ICD-10-CM | POA: Diagnosis not present

## 2015-05-31 DIAGNOSIS — I272 Pulmonary hypertension, unspecified: Secondary | ICD-10-CM | POA: Diagnosis present

## 2015-05-31 HISTORY — DX: Pulmonary hypertension, unspecified: I27.20

## 2015-05-31 LAB — BASIC METABOLIC PANEL
ANION GAP: 9 (ref 5–15)
BUN: 10 mg/dL (ref 6–20)
CHLORIDE: 94 mmol/L — AB (ref 101–111)
CO2: 25 mmol/L (ref 22–32)
Calcium: 8.8 mg/dL — ABNORMAL LOW (ref 8.9–10.3)
Creatinine, Ser: 0.71 mg/dL (ref 0.61–1.24)
GFR calc Af Amer: >60 mL/min (ref >60)
GLUCOSE: 303 mg/dL — AB (ref 65–99)
POTASSIUM: 4.2 mmol/L (ref 3.5–5.1)
SODIUM: 128 mmol/L — AB (ref 135–145)

## 2015-05-31 LAB — CBC
HEMATOCRIT: 36.2 % — AB (ref 39.0–52.0)
HEMOGLOBIN: 12 g/dL — AB (ref 13.0–17.0)
MCH: 27.2 pg (ref 26.0–34.0)
MCHC: 33.1 g/dL (ref 30.0–36.0)
MCV: 82.1 fL (ref 78.0–100.0)
Platelets: 515 10*3/uL — ABNORMAL HIGH (ref 150–400)
RBC: 4.41 MIL/uL (ref 4.22–5.81)
RDW: 14.1 % (ref 11.5–15.5)
WBC: 15.1 10*3/uL — AB (ref 4.0–10.5)

## 2015-05-31 LAB — I-STAT TROPONIN, ED: Troponin i, poc: 0 ng/mL (ref 0.00–0.08)

## 2015-05-31 MED ORDER — VANCOMYCIN HCL IN DEXTROSE 1-5 GM/200ML-% IV SOLN
1000.0000 mg | Freq: Once | INTRAVENOUS | Status: AC
  Administered 2015-06-01: 1000 mg via INTRAVENOUS
  Filled 2015-05-31: qty 200

## 2015-05-31 MED ORDER — SODIUM CHLORIDE 0.9 % IV SOLN
1000.0000 mL | INTRAVENOUS | Status: DC
  Administered 2015-06-01 (×2): 1000 mL via INTRAVENOUS

## 2015-05-31 MED ORDER — SODIUM CHLORIDE 0.9 % IV SOLN
1000.0000 mL | Freq: Once | INTRAVENOUS | Status: AC
  Administered 2015-06-01: 1000 mL via INTRAVENOUS

## 2015-05-31 NOTE — ED Notes (Signed)
Pt presents to ED with right chest pain and shortness of breath.  Pt currently has lung cancer.  Oxygen sat of 85% while in triage.  Placed on 2 L [place].

## 2015-05-31 NOTE — ED Provider Notes (Addendum)
CSN: [id number]     Arrival date & time 05/31/15  2226 History  By signing my name below, I, Steven Roberts, attest that this documentation has been prepared under the direction and in the presence of Delora Fuel, MD. Electronically Signed: Eustaquio Roberts, ED Scribe. 06/01/2015. 12:01 AM.   Chief Complaint  Patient presents with  . Chest Pain  . Shortness of Breath   The history is provided by the patient and the spouse. The history is limited by a language barrier. No language interpreter was used.     HPI Comments: Steven Roberts is a 50 y.o. male with PMHx LUL adenocarcinoma, who presents to the Emergency Department complaining of gradual onset, constant, left sided chest pain that began tonight. Pt also complains of shortness of breath, cough, and palpitations.Pt's oncologist recently stopped the medication he was taking for his lung cancer approximately 2-3 weeks ago. He was recently admitted for pneumonia. Denies fever, nausea, vomiting, arthralgias, or any other associated symptoms.    Past Medical History  Diagnosis Date  . Bone cancer ([place]) 11/03/14 Pet scan    left 6th rib, right hip  . Lung cancer ([place]) 10/23/14    LUL adenocarcinoma, non-small cell  . Pericardial effusion with cardiac tamponade 12/11/2014  . Pulmonary infiltrates  c/w Adenoca/ Stage IV  10/13/2014    - symptom onset march 2016 - Baseline cxr on July 20 2014 reported to be nl  > then CT chest 10/13/2014 LUL as dz  Assoc with   LUL nodular density/ mild adenopathy - Quantiferon Gold 10/13/2014 > neg  - FOB 10/23/14 > cobblestoning beyond Lingular orifice with diffuse narrowing x 50% of LUL beyond lingula> adenoca - PET 11/03/14 1. The dominant left upper lobe mass and extensive adenopathy involving th  . Non-small cell carcinoma of lung, stage 4 ([place]) 11/12/2014  . Hypoalbuminemia due to protein-calorie malnutrition ([place]) 12/02/2014  . Hyponatremia 12/10/2014  . Hyperphosphatemia 12/02/2014  . Bone metastasis ([place])  11/25/2014  . S/P radiation therapy 12/07/14-01/18/15    left lung/lateral rib /rt hip  . Diabetes mellitus without complication ([place])   . Encounter for antineoplastic chemotherapy 03/09/2015   Past Surgical History  Procedure Laterality Date  . Video bronchoscopy Bilateral 10/23/2014    Procedure: VIDEO BRONCHOSCOPY WITHOUT FLUORO;  Surgeon: Tanda Rockers, MD;  Location: [facility];  Service: Cardiopulmonary;  Laterality: Bilateral;  . Subxyphoid pericardial window N/A 12/11/2014    Procedure: SUBXYPHOID PERICARDIAL WINDOW;  Surgeon: Rexene Alberts, MD;  Location: [facility];  Service: Thoracic;  Laterality: N/A;  . Tee without cardioversion N/A 12/11/2014    Procedure: TRANSESOPHAGEAL ECHOCARDIOGRAM (TEE);  Surgeon: Rexene Alberts, MD;  Location: [facility] OR;  Service: Thoracic;  Laterality: N/A;   Family History  Problem Relation Age of Onset  . Cancer Father     Prostate cancer  . Diabetes Mother    Social History  Substance Use Topics  . Smoking status: Never Smoker   . Smokeless tobacco: Never Used  . Alcohol Use: No    Review of Systems  Constitutional: Negative for fever.  Respiratory: Positive for cough and shortness of breath.   Cardiovascular: Positive for chest pain and palpitations.  Gastrointestinal: Negative for nausea and vomiting.  Musculoskeletal: Negative for myalgias and arthralgias.  All other systems reviewed and are negative.  Allergies  Review of patient's allergies indicates no known allergies.  Home Medications   Prior to Admission medications   Medication Sig Start Date End Date Taking? Authorizing Provider  dextromethorphan (DELSYM) 30 MG/5ML liquid Take 30 mg by mouth at bedtime as needed for cough.   Yes Historical Provider, MD  fentaNYL (DURAGESIC - DOSED MCG/HR) 50 MCG/HR Place 1 patch (50 mcg total) onto the skin every 3 (three) days. 05/20/15  Yes Susanne Borders, NP  HYDROcodone-acetaminophen (NORCO/VICODIN) 5-325 MG tablet Take 1-2 tablets by mouth  every 6 (six) hours as needed for moderate pain. 05/20/15  Yes Susanne Borders, NP  HYDROcodone-homatropine (HYCODAN) 5-1.5 MG/5ML syrup Take 5 mLs by mouth every 6 (six) hours as needed for cough.  04/13/15  Yes Historical Provider, MD  ibuprofen (ADVIL) 200 MG tablet Take 400 mg by mouth every 6 (six) hours as needed for mild pain. Reported on 03/09/2015   Yes Historical Provider, MD  benzonatate (TESSALON) 200 MG capsule Take 1 capsule (200 mg total) by mouth 3 (three) times daily as needed for cough. Patient not taking: Reported on 04/13/2015 01/05/15   Kyung Rudd, MD  chlorpheniramine-HYDROcodone (TUSSIONEX) 10-8 MG/5ML SUER Take 5 mLs by mouth every 12 (twelve) hours as needed for cough. Patient not taking: Reported on 05/31/2015 05/20/15   Susanne Borders, NP  erlotinib (TARCEVA) 150 MG tablet Take 1 tablet (150 mg total) by mouth daily. Take on an empty stomach 1 hour before meals or 2 hours after. Patient not taking: Reported on 05/20/2015 05/18/15   Curt Bears, MD  glimepiride (AMARYL) 2 MG tablet Take 1 tablet (2 mg total) by mouth daily with breakfast. Patient not taking: Reported on 05/31/2015 12/20/14   Nishant Dhungel, MD  methylPREDNISolone (MEDROL DOSEPAK) 4 MG TBPK tablet Medrol dose pak: take as directed. Patient not taking: Reported on 05/31/2015 05/20/15   Susanne Borders, NP   BP 97/85 mmHg  Pulse 126  Temp(Src) 98.6 F (37 C) (Oral)  Resp 23  SpO2 94%   Physical Exam  Constitutional: He is oriented to person, place, and time. He appears well-developed and well-nourished. No distress.  HENT:  Head: Normocephalic and atraumatic.  Eyes: Conjunctivae and EOM are normal. Pupils are equal, round, and reactive to light.  Neck: Normal range of motion. Neck supple. No JVD present.  Cardiovascular: Normal rate, regular rhythm and normal heart sounds.   No murmur heard. Pulmonary/Chest: Effort normal. He has wheezes. He has rales. He exhibits no tenderness.  Fine rales right lower and  lateral lung fields  Abdominal: Soft. Bowel sounds are normal. He exhibits no distension and no mass. There is no tenderness.  Musculoskeletal: Normal range of motion. He exhibits no edema.  Lymphadenopathy:    He has no cervical adenopathy.  Neurological: He is alert and oriented to person, place, and time. No cranial nerve deficit. He exhibits normal muscle tone. Coordination normal.  Skin: Skin is warm and dry. No rash noted.  Psychiatric: He has a normal mood and affect. His behavior is normal. Judgment and thought content normal.  Nursing note and vitals reviewed.   ED Course  Procedures (including critical care time)  DIAGNOSTIC STUDIES: Oxygen Saturation is 94% on [place], adequate by my interpretation.    COORDINATION OF CARE: 11:59 PM-Discussed treatment plan which includes consult to hospitalist with pt at bedside and pt agreed to plan.   Labs Review Results for orders placed or performed during the hospital encounter of 09/32/67  Basic metabolic panel  Result Value Ref Range   Sodium 128 (L) 135 - 145 mmol/L   Potassium 4.2 3.5 - 5.1 mmol/L   Chloride 94 (L) 101 - 111  mmol/L   CO2 25 22 - 32 mmol/L   Glucose, Bld 303 (H) 65 - 99 mg/dL   BUN 10 6 - 20 mg/dL   Creatinine, Ser 0.71 0.61 - 1.24 mg/dL   Calcium 8.8 (L) 8.9 - 10.3 mg/dL   GFR calc non Af Amer >60 >60 mL/min   GFR calc Af Amer >60 >60 mL/min   Anion gap 9 5 - 15  CBC  Result Value Ref Range   WBC 15.1 (H) 4.0 - 10.5 K/uL   RBC 4.41 4.22 - 5.81 MIL/uL   Hemoglobin 12.0 (L) 13.0 - 17.0 g/dL   HCT 36.2 (L) 39.0 - 52.0 %   MCV 82.1 78.0 - 100.0 fL   MCH 27.2 26.0 - 34.0 pg   MCHC 33.1 30.0 - 36.0 g/dL   RDW 14.1 11.5 - 15.5 %   Platelets 515 (H) 150 - 400 K/uL  Differential  Result Value Ref Range   Neutrophils Relative % 86 %   Neutro Abs 12.7 (H) 1.7 - 7.7 K/uL   Lymphocytes Relative 4 %   Lymphs Abs 0.5 (L) 0.7 - 4.0 K/uL   Monocytes Relative 7 %   Monocytes Absolute 1.1 (H) 0.1 - 1.0 K/uL    Eosinophils Relative 3 %   Eosinophils Absolute 0.5 0.0 - 0.7 K/uL   Basophils Relative 0 %   Basophils Absolute 0.0 0.0 - 0.1 K/uL  CBC WITH DIFFERENTIAL  Result Value Ref Range   WBC 9.9 4.0 - 10.5 K/uL   RBC 3.96 (L) 4.22 - 5.81 MIL/uL   Hemoglobin 10.8 (L) 13.0 - 17.0 g/dL   HCT 32.7 (L) 39.0 - 52.0 %   MCV 82.6 78.0 - 100.0 fL   MCH 27.3 26.0 - 34.0 pg   MCHC 33.0 30.0 - 36.0 g/dL   RDW 14.3 11.5 - 15.5 %   Platelets 443 (H) 150 - 400 K/uL   Neutrophils Relative % 75 %   Neutro Abs 7.4 1.7 - 7.7 K/uL   Lymphocytes Relative 7 %   Lymphs Abs 0.7 0.7 - 4.0 K/uL   Monocytes Relative 13 %   Monocytes Absolute 1.3 (H) 0.1 - 1.0 K/uL   Eosinophils Relative 5 %   Eosinophils Absolute 0.5 0.0 - 0.7 K/uL   Basophils Relative 0 %   Basophils Absolute 0.0 0.0 - 0.1 K/uL  Comprehensive metabolic panel  Result Value Ref Range   Sodium 134 (L) 135 - 145 mmol/L   Potassium 3.8 3.5 - 5.1 mmol/L   Chloride 99 (L) 101 - 111 mmol/L   CO2 27 22 - 32 mmol/L   Glucose, Bld 192 (H) 65 - 99 mg/dL   BUN 9 6 - 20 mg/dL   Creatinine, Ser 0.58 (L) 0.61 - 1.24 mg/dL   Calcium 8.6 (L) 8.9 - 10.3 mg/dL   Total Protein 5.6 (L) 6.5 - 8.1 g/dL   Albumin 2.2 (L) 3.5 - 5.0 g/dL   AST 17 15 - 41 U/L   ALT 32 17 - 63 U/L   Alkaline Phosphatase 143 (H) 38 - 126 U/L   Total Bilirubin 0.4 0.3 - 1.2 mg/dL   GFR calc non Af Amer >60 >60 mL/min   GFR calc Af Amer >60 >60 mL/min   Anion gap 8 5 - 15  Glucose, capillary  Result Value Ref Range   Glucose-Capillary 155 (H) 65 - 99 mg/dL  I-stat troponin, ED  Result Value Ref Range   Troponin i, poc 0.00 0.00 - 0.08  ng/mL   Comment 3          I-Stat CG4 Lactic Acid, ED  Result Value Ref Range   Lactic Acid, Venous 1.67 0.5 - 2.0 mmol/L   Imaging Review Dg Chest 2 View  05/31/2015  CLINICAL DATA:  Shortness of breath, right lower posterior rib pain, vomiting, chronic dry cough, hypoxia. EXAM: CHEST  2 VIEW COMPARISON:  Chest radiograph dated 05/03/2015.  CT chest dated 05/08/2015. FINDINGS: Radiation changes in the left upper lobe and left lower lobe. Progressive patchy opacities in the right upper and lower lobes, significantly increased from the prior chest radiograph. While lymphangitic spread of tumor is possible when correlating with the prior CT, this appearance is also worrisome for multifocal pneumonia. Small right pleural effusion.  No pneumothorax. The heart is normal size. Visualized osseous structures are within normal limits. IMPRESSION: Radiation changes in the left upper and left lower lobes. Progressive multifocal patchy opacities in the right lung, significantly increased. While lymphangitic spread of tumor is possible when correlating with prior CT, superimposed multifocal pneumonia is not excluded. Small right pleural effusion. Electronically Signed   By: Julian Hy M.D.   On: 05/31/2015 23:49   I have personally reviewed and evaluated these images and lab results as part of my medical decision-making.   EKG Interpretation   Date/Time:  Monday May 31 2015 22:40:31 EDT Ventricular Rate:  124 PR Interval:  146 QRS Duration: 89 QT Interval:  322 QTC Calculation: 462 R Axis:   87 Text Interpretation:  Sinus tachycardia Nonspecific T abnormalities,  diffuse leads When compared with ECG of 05/08/2015, HEART RATE has  increased T wave abnormality is more pronounced Confirmed by [facility]  MD,  Menaal Russum ([id number]) on 05/31/2015 11:51:50 PM      MDM   Final diagnoses:  HCAP (healthcare-associated pneumonia)  Primary cancer of left upper lobe of lung (HCC)  Hyponatremia  Normochromic normocytic anemia  Thrombocytosis (HCC)    Cough and dyspnea with x-ray showing new infiltrates on the right side. While this may represent lymphangitic spread of tumor, clinical presentation is more consistent with pneumonia. Old records are reviewed and he was hospitalized less than a month ago with community-acquired pneumonia. Infiltrates at that  time her on the left lung. Therefore, he needs to be treated as healthcare associated pneumonia. He is started on vancomycin and cefepime. In spite of dyspnea and tachycardia, he does not appear toxic. Lactic acid has been ordered and is given a liter of fluid. Case is discussed with Dr. Olevia Bowens of [facility] who agrees to admit the patient.   I personally performed the services described in this documentation, which was scribed in my presence. The recorded information has been reviewed and is accurate.         Delora Fuel, MD 44/92/01 [id number]  Delora Fuel, MD 21/97/58 [id number]

## 2015-05-31 NOTE — ED Notes (Signed)
Bed: PR91 Expected date:  Expected time:  Means of arrival:  Comments: [facility] 2

## 2015-06-01 ENCOUNTER — Other Ambulatory Visit: Payer: Self-pay

## 2015-06-01 ENCOUNTER — Encounter: Payer: Self-pay | Admitting: *Deleted

## 2015-06-01 ENCOUNTER — Encounter (HOSPITAL_COMMUNITY): Payer: Self-pay | Admitting: Internal Medicine

## 2015-06-01 ENCOUNTER — Other Ambulatory Visit: Payer: Self-pay | Admitting: Internal Medicine

## 2015-06-01 ENCOUNTER — Ambulatory Visit: Payer: Self-pay | Admitting: Internal Medicine

## 2015-06-01 DIAGNOSIS — R918 Other nonspecific abnormal finding of lung field: Secondary | ICD-10-CM

## 2015-06-01 DIAGNOSIS — C3412 Malignant neoplasm of upper lobe, left bronchus or lung: Secondary | ICD-10-CM

## 2015-06-01 DIAGNOSIS — C7951 Secondary malignant neoplasm of bone: Secondary | ICD-10-CM

## 2015-06-01 DIAGNOSIS — R079 Chest pain, unspecified: Secondary | ICD-10-CM

## 2015-06-01 DIAGNOSIS — R0602 Shortness of breath: Secondary | ICD-10-CM

## 2015-06-01 DIAGNOSIS — J189 Pneumonia, unspecified organism: Principal | ICD-10-CM

## 2015-06-01 LAB — COMPREHENSIVE METABOLIC PANEL
ALBUMIN: 2.2 g/dL — AB (ref 3.5–5.0)
ALT: 32 U/L (ref 17–63)
ANION GAP: 8 (ref 5–15)
AST: 17 U/L (ref 15–41)
Alkaline Phosphatase: 143 U/L — ABNORMAL HIGH (ref 38–126)
BUN: 9 mg/dL (ref 6–20)
CALCIUM: 8.6 mg/dL — AB (ref 8.9–10.3)
CHLORIDE: 99 mmol/L — AB (ref 101–111)
CO2: 27 mmol/L (ref 22–32)
Creatinine, Ser: 0.58 mg/dL — ABNORMAL LOW (ref 0.61–1.24)
GFR calc non Af Amer: >60 mL/min (ref >60)
GLUCOSE: 192 mg/dL — AB (ref 65–99)
POTASSIUM: 3.8 mmol/L (ref 3.5–5.1)
SODIUM: 134 mmol/L — AB (ref 135–145)
Total Bilirubin: 0.4 mg/dL (ref 0.3–1.2)
Total Protein: 5.6 g/dL — ABNORMAL LOW (ref 6.5–8.1)

## 2015-06-01 LAB — CBC WITH DIFFERENTIAL/PLATELET
BASOS ABS: 0 10*3/uL (ref 0.0–0.1)
BASOS PCT: 0 %
EOS ABS: 0.5 10*3/uL (ref 0.0–0.7)
EOS PCT: 5 %
HCT: 32.7 % — ABNORMAL LOW (ref 39.0–52.0)
Hemoglobin: 10.8 g/dL — ABNORMAL LOW (ref 13.0–17.0)
Lymphocytes Relative: 7 %
Lymphs Abs: 0.7 10*3/uL (ref 0.7–4.0)
MCH: 27.3 pg (ref 26.0–34.0)
MCHC: 33 g/dL (ref 30.0–36.0)
MCV: 82.6 fL (ref 78.0–100.0)
Monocytes Absolute: 1.3 10*3/uL — ABNORMAL HIGH (ref 0.1–1.0)
Monocytes Relative: 13 %
NEUTROS PCT: 75 %
Neutro Abs: 7.4 10*3/uL (ref 1.7–7.7)
PLATELETS: 443 10*3/uL — AB (ref 150–400)
RBC: 3.96 MIL/uL — AB (ref 4.22–5.81)
RDW: 14.3 % (ref 11.5–15.5)
WBC: 9.9 10*3/uL (ref 4.0–10.5)

## 2015-06-01 LAB — DIFFERENTIAL
Basophils Absolute: 0 10*3/uL (ref 0.0–0.1)
Basophils Relative: 0 %
Eosinophils Absolute: 0.5 10*3/uL (ref 0.0–0.7)
Eosinophils Relative: 3 %
LYMPHS PCT: 4 %
Lymphs Abs: 0.5 10*3/uL — ABNORMAL LOW (ref 0.7–4.0)
Monocytes Absolute: 1.1 10*3/uL — ABNORMAL HIGH (ref 0.1–1.0)
Monocytes Relative: 7 %
NEUTROS ABS: 12.7 10*3/uL — AB (ref 1.7–7.7)
Neutrophils Relative %: 86 %

## 2015-06-01 LAB — GLUCOSE, CAPILLARY
GLUCOSE-CAPILLARY: 157 mg/dL — AB (ref 65–99)
GLUCOSE-CAPILLARY: 167 mg/dL — AB (ref 65–99)
GLUCOSE-CAPILLARY: 167 mg/dL — AB (ref 65–99)
Glucose-Capillary: 155 mg/dL — ABNORMAL HIGH (ref 65–99)

## 2015-06-01 LAB — I-STAT CG4 LACTIC ACID, ED: Lactic Acid, Venous: 1.67 mmol/L (ref 0.5–2.0)

## 2015-06-01 LAB — STREP PNEUMONIAE URINARY ANTIGEN: STREP PNEUMO URINARY ANTIGEN: NEGATIVE

## 2015-06-01 MED ORDER — ONDANSETRON HCL 4 MG/2ML IJ SOLN: 4.0000 mg | Freq: Four times a day (QID) | INTRAMUSCULAR | Status: DC | PRN

## 2015-06-01 MED ORDER — ENOXAPARIN SODIUM 40 MG/0.4ML ~~LOC~~ SOLN
40.0000 mg | Freq: Every day | SUBCUTANEOUS | Status: DC
  Administered 2015-06-01 – 2015-06-02 (×3): 40 mg via SUBCUTANEOUS
  Filled 2015-06-01 (×3): qty 0.4

## 2015-06-01 MED ORDER — ONDANSETRON HCL 4 MG/2ML IJ SOLN
4.0000 mg | Freq: Four times a day (QID) | INTRAMUSCULAR | Status: DC | PRN
Start: 2015-06-01 — End: 2015-06-01

## 2015-06-01 MED ORDER — ONDANSETRON HCL 4 MG PO TABS: 4.0000 mg | ORAL_TABLET | Freq: Four times a day (QID) | ORAL | Status: DC | PRN

## 2015-06-01 MED ORDER — DEXTROMETHORPHAN POLISTIREX ER 30 MG/5ML PO SUER
30.0000 mg | Freq: Every evening | ORAL | Status: DC | PRN
  Filled 2015-06-01: qty 5

## 2015-06-01 MED ORDER — OSIMERTINIB MESYLATE 80 MG PO TABS: 80.0000 mg | ORAL_TABLET | Freq: Every day | ORAL | Status: DC

## 2015-06-01 MED ORDER — OXYCODONE-ACETAMINOPHEN 5-325 MG PO TABS
1.0000 | ORAL_TABLET | ORAL | Status: DC | PRN
  Administered 2015-06-01: 1 via ORAL
  Administered 2015-06-02: 2 via ORAL
  Filled 2015-06-01: qty 2
  Filled 2015-06-01: qty 1

## 2015-06-01 MED ORDER — INSULIN ASPART 100 UNIT/ML ~~LOC~~ SOLN
0.0000 [IU] | Freq: Three times a day (TID) | SUBCUTANEOUS | Status: DC
  Administered 2015-06-01 – 2015-06-02 (×4): 3 [IU] via SUBCUTANEOUS
  Administered 2015-06-02 – 2015-06-03 (×3): 5 [IU] via SUBCUTANEOUS
  Administered 2015-06-03: 3 [IU] via SUBCUTANEOUS

## 2015-06-01 MED ORDER — ALBUTEROL SULFATE (2.5 MG/3ML) 0.083% IN NEBU: 2.5000 mg | INHALATION_SOLUTION | Freq: Four times a day (QID) | RESPIRATORY_TRACT | Status: DC | PRN

## 2015-06-01 MED ORDER — FENTANYL 25 MCG/HR TD PT72
50.0000 ug | MEDICATED_PATCH | TRANSDERMAL | Status: DC
  Administered 2015-06-02: 50 ug via TRANSDERMAL
  Filled 2015-06-01 (×2): qty 2

## 2015-06-01 MED ORDER — DEXTROSE 5 % IV SOLN
1.0000 g | Freq: Three times a day (TID) | INTRAVENOUS | Status: DC
  Filled 2015-06-01 (×2): qty 1

## 2015-06-01 MED ORDER — HYDROCODONE-ACETAMINOPHEN 5-325 MG PO TABS
1.0000 | ORAL_TABLET | Freq: Four times a day (QID) | ORAL | Status: DC | PRN
  Administered 2015-06-01: 2 via ORAL
  Filled 2015-06-01: qty 2

## 2015-06-01 MED ORDER — IPRATROPIUM BROMIDE 0.02 % IN SOLN: 0.5000 mg | Freq: Four times a day (QID) | RESPIRATORY_TRACT | Status: DC | PRN

## 2015-06-01 MED ORDER — VANCOMYCIN HCL IN DEXTROSE 1-5 GM/200ML-% IV SOLN
1000.0000 mg | Freq: Two times a day (BID) | INTRAVENOUS | Status: DC
  Administered 2015-06-01: 1000 mg via INTRAVENOUS
  Filled 2015-06-01: qty 200

## 2015-06-01 MED ORDER — DEXTROSE 5 % IV SOLN
2.0000 g | Freq: Three times a day (TID) | INTRAVENOUS | Status: DC
  Administered 2015-06-01 (×2): 2 g via INTRAVENOUS
  Filled 2015-06-01 (×3): qty 2

## 2015-06-01 MED ORDER — KETOROLAC TROMETHAMINE 30 MG/ML IJ SOLN
30.0000 mg | Freq: Once | INTRAMUSCULAR | Status: AC
  Administered 2015-06-01: 30 mg via INTRAVENOUS
  Filled 2015-06-01: qty 1

## 2015-06-01 MED ORDER — PANTOPRAZOLE SODIUM 40 MG PO TBEC
40.0000 mg | DELAYED_RELEASE_TABLET | Freq: Every day | ORAL | Status: DC
  Administered 2015-06-01 – 2015-06-03 (×3): 40 mg via ORAL
  Filled 2015-06-01 (×3): qty 1

## 2015-06-01 MED ORDER — HYDROCODONE-HOMATROPINE 5-1.5 MG/5ML PO SYRP
5.0000 mL | ORAL_SOLUTION | Freq: Four times a day (QID) | ORAL | Status: DC | PRN
  Administered 2015-06-01 – 2015-06-02 (×5): 5 mL via ORAL
  Filled 2015-06-01 (×5): qty 5

## 2015-06-01 MED ORDER — PANTOPRAZOLE SODIUM 40 MG IV SOLR
40.0000 mg | INTRAVENOUS | Status: DC
  Administered 2015-06-01: 40 mg via INTRAVENOUS
  Filled 2015-06-01: qty 40

## 2015-06-01 NOTE — Progress Notes (Signed)
Oncology Nurse Navigator Documentation  Oncology Nurse Navigator Flowsheets 06/01/2015  Navigator Location [facility]  Navigator Encounter Type Other  Barriers/Navigation Needs Coordination of Care  Interventions Coordination of Care  Acuity Level 2  Time Spent with Patient 79   Per Dr. Worthy Flank request, I updated Oral chemo navigator regarding tx change for Mr. Pilz.  Mr. Kortz has disease progression.  He tested positive for the T790M.  His treatment will be Tagrisso.  I updated Montel Clock regarding tx change.

## 2015-06-01 NOTE — Progress Notes (Signed)
DIAGNOSIS: Stage IV (T1b, N3, M1b) non-small cell lung cancer, adenocarcinoma with positive EGFR mutation in exon 21 (L858R) presented with left upper lobe lung mass in addition to bilateral pulmonary nodules and mediastinal as well as supraclavicular lymphadenopathy and metastatic bone lesions diagnosed in September 2016.  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  PRIOR THERAPY:  1) status post subxiphoid pericardial window for cardiac tamponade as well as a right chest tube placement for drainage of right pleural effusion under the care of Dr. Roxy Manns. 2) Palliative radiotherapy to the left lung/left lateral rib as well as a right hip under the care of Dr. Lisbeth Renshaw completed on 01/18/2015. 3) Tarceva 150 mg by mouth daily. Status post 6 months of treatment.  CURRENT THERAPY: She is expected to start treatment with Tagrisso 80 mg by mouth daily in the next few days  Subjective: The patient is seen and examined today. His wife was at the bedside. He is very pleasant 50 years old Hispanic male was diagnosed with a stage IV non-small cell lung cancer, adenocarcinoma with positive EGFR mutation in exon 21 in September 2016. He was treated with Tarceva 150 mg for 6 months but unfortunately the patient has evidence for disease progression. Repeat plasma tested for EGFR mutation showed the development of T790M resistant mutation. The patient was admitted yesterday to [facility] complaining of worsening dyspnea as well as right-sided chest pain. Repeat chest x-ray showed radiation changes in the left upper and left lower lobes. There was also progressive multifocal patchy opacities in the right lung that significantly increased highly suspicious for lymphangitic spread of tumor. The patient denied having any significant fever or chills. He has no nausea or vomiting. He was supposed to come to the clinic today for evaluation and discussion of his treatment options after the finding of the  resistant mutation but he was admitted yesterday.  Objective: Vital signs in last 24 hours: Temp:  [97.9 F (36.6 C)-98.6 F (37 C)] 97.9 F (36.6 C) (04/18 0503) Pulse Rate:  [86-127] 86 (04/18 0503) Resp:  [20-24] 22 (04/18 0503) BP: (97-133)/(59-85) 111/63 mmHg (04/18 0503) SpO2:  [92 %-96 %] 96 % (04/18 0503) Weight:  [159 lb 9.6 oz (72.394 kg)] 159 lb 9.6 oz (72.394 kg) (04/18 0139)  Intake/Output from previous day: 04/17 0701 - 04/18 0700 In: 823.3 [P.O.:240; I.V.:533.3; IV Piggyback:50] Out: 250 [Urine:250] Intake/Output this shift:    General appearance: alert, cooperative, fatigued and mild distress Resp: diminished breath sounds RLL, dullness to percussion RLL and rales bilaterally Cardio: regular rate and rhythm, S1, S2 normal, no murmur, click, rub or gallop GI: soft, non-tender; bowel sounds normal; no masses,  no organomegaly Extremities: extremities normal, atraumatic, no cyanosis or edema  Lab Results:   Recent Labs  05/31/15 2306 06/01/15 0447  WBC 15.1* 9.9  HGB 12.0* 10.8*  HCT 36.2* 32.7*  PLT 515* 443*   BMET  Recent Labs  05/31/15 2306 06/01/15 0447  NA 128* 134*  K 4.2 3.8  CL 94* 99*  CO2 25 27  GLUCOSE 303* 192*  BUN 10 9  CREATININE 0.71 0.58*  CALCIUM 8.8* 8.6*    Studies/Results: Dg Chest 2 View  05/31/2015  CLINICAL DATA:  Shortness of breath, right lower posterior rib pain, vomiting, chronic dry cough, hypoxia. EXAM: CHEST  2 VIEW COMPARISON:  Chest radiograph dated 05/03/2015. CT chest dated 05/08/2015. FINDINGS: Radiation changes in the left upper lobe and left lower lobe. Progressive patchy opacities in the right  upper and lower lobes, significantly increased from the prior chest radiograph. While lymphangitic spread of tumor is possible when correlating with the prior CT, this appearance is also worrisome for multifocal pneumonia. Small right pleural effusion.  No pneumothorax. The heart is normal size. Visualized osseous  structures are within normal limits. IMPRESSION: Radiation changes in the left upper and left lower lobes. Progressive multifocal patchy opacities in the right lung, significantly increased. While lymphangitic spread of tumor is possible when correlating with prior CT, superimposed multifocal pneumonia is not excluded. Small right pleural effusion. Electronically Signed   By: Julian Hy M.D.   On: 05/31/2015 23:49    Medications: I have reviewed the patient's current medications.  Assessment/Plan: This is a very pleasant 50 years old Hispanic male with metastatic non-small cell lung cancer, adenocarcinoma with positive EGFR mutation is status post 6 months treatment with Tarceva but unfortunately the patient has evidence for disease progression. Recent plasma test showed positive T790M resistant mutation. There is an FDA approved third-generation EGFR tyrosine kinase inhibitor, Tagrisso which showed response rate of 60% and better progression free survival and overall survival in patient who developed this resistant mutation.  I discussed the recent findings with the patient and his wife. I recommended for him to start treatment with Tagrisso as soon as possible. I will send his prescription to [facility] for refill and the patient will start treatment once he received the medication. In preparation for treatment with Tagrisso, I will order EKG to rule out QT prolongation as well as 2-D echo for evaluation of his ejection fraction. I think his shortness of breath and right-sided chest pain are secondary to disease progression rather than inflammatory process. He is not expected to have significant improvement in his condition until he start the new treatment. I hope he will receive his medication soon. Thank you so much for taking good care of Mr. Ledo. I will arrange for the patient follow-up visit at the [facility] for reevaluation and management of any adverse effect of  his treatment in the next 2 weeks.   LOS: 0 days    Congetta Odriscoll K. 06/01/2015

## 2015-06-01 NOTE — H&P (Signed)
[facility] History and Physical  Steven Roberts VZC:[id number] DOB: [birth year] DOA: 05/31/2015  Referring physician: Delora Fuel, M.D. PCP: Pcp Not In System   Chief Complaint: Shortness of breath.  HPI: Steven Roberts is a 50 y.o. 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  Per patient, several days after he was discharged, he started feeling pleuritic chest pain and noticed that he was getting short of breath and fatigued a lot easier. He has also been having loss of appetite, night sweats, fever, chills and productive cough. He states that at times, coughing was so intense, that he had nausea and emesis. He denies abdominal pain, diarrhea, melena or hematochezia, but complains of constipation. He denies GU symptoms.  When seen, the patient stated that he was feeling better with the treatment provided. He was in no acute distress. Workup in the emergency department show worsening of checks his right infiltrates and leukocytosis.    Review of Systems:  Constitutional:  Positive weight loss, night sweats, Fevers, chills, fatigue.  HEENT:  No headaches, Difficulty swallowing,Tooth/dental problems,Sore throat,  No sneezing, itching, ear ache, nasal congestion, post nasal drip,  Cardio-vascular:  Positive palpitations, nausea, vomiting No chest pain, Orthopnea, PND, swelling in lower extremities, anasarca, dizziness GI:  Positive loss of appetite  No heartburn, indigestion, abdominal pain,, diarrhea, change in bowel habits.  Resp:  Positive pleuritic chest pain, dyspnea, productive cough, no wheezing, no hemoptysis Skin:  no rash or lesions.  GU:  no dysuria, change in color of urine, no urgency or frequency. No flank pain.    Musculoskeletal:  No joint pain or swelling. No decreased range of motion. No back pain.  Psych:  No change in mood or affect. No depression or anxiety. No memory loss.   Past Medical History  Diagnosis Date  . Bone cancer ([place]) 11/03/14 Pet scan    left 6th rib, right hip  . Lung cancer ([place]) 10/23/14    LUL adenocarcinoma, non-small cell  . Pericardial effusion with cardiac tamponade 12/11/2014  . Pulmonary infiltrates  c/w Adenoca/ Stage IV  10/13/2014    - symptom onset march 2016 - Baseline cxr on July 20 2014 reported to be nl  > then CT chest 10/13/2014 LUL as dz  Assoc with   LUL nodular density/ mild adenopathy - Quantiferon Gold 10/13/2014 > neg  - FOB 10/23/14 > cobblestoning beyond Lingular orifice with diffuse narrowing x 50% of LUL beyond lingula> adenoca - PET 11/03/14 1. The dominant left upper lobe mass and extensive adenopathy involving th  . Non-small cell carcinoma of lung, stage 4 ([place]) 11/12/2014  . Hypoalbuminemia due to protein-calorie malnutrition ([place]) 12/02/2014  . Hyponatremia 12/10/2014  . Hyperphosphatemia 12/02/2014  . Bone metastasis ([place]) 11/25/2014  . S/P radiation therapy 12/07/14-01/18/15    left lung/lateral rib /rt hip  . Diabetes mellitus without complication ([place])   . Encounter for antineoplastic chemotherapy 03/09/2015   Past Surgical History  Procedure Laterality Date  . Video bronchoscopy Bilateral 10/23/2014    Procedure: VIDEO BRONCHOSCOPY WITHOUT FLUORO;  Surgeon: Tanda Rockers, MD;  Location: [facility];  Service: Cardiopulmonary;  Laterality: Bilateral;  . Subxyphoid pericardial window N/A 12/11/2014    Procedure: SUBXYPHOID PERICARDIAL WINDOW;  Surgeon: Rexene Alberts, MD;  Location: [facility];  Service: Thoracic;  Laterality: N/A;  . Tee without cardioversion N/A 12/11/2014    Procedure: TRANSESOPHAGEAL ECHOCARDIOGRAM (TEE);  Surgeon: Rexene Alberts, MD;  Location: [facility];  Service: Thoracic;  Laterality: N/A;   Social History:  reports that he  has never smoked. He has never used smokeless tobacco. He reports that he does not drink alcohol or use illicit drugs.  No Known Allergies  Family History  Problem Relation Age of Onset  . Cancer Father     Prostate cancer  . Diabetes Mother     Prior to Admission medications   Medication Sig Start Date End Date Taking? Authorizing Provider  dextromethorphan (DELSYM) 30 MG/5ML liquid Take 30 mg by mouth at bedtime as needed for cough.   Yes Historical Provider, MD  fentaNYL (DURAGESIC - DOSED MCG/HR) 50 MCG/HR Place 1 patch (50 mcg total) onto the skin every 3 (three) days. 05/20/15  Yes Susanne Borders, NP  HYDROcodone-acetaminophen (NORCO/VICODIN) 5-325 MG tablet Take 1-2 tablets by mouth every 6 (six) hours as needed for moderate pain. 05/20/15  Yes Susanne Borders, NP  HYDROcodone-homatropine (HYCODAN) 5-1.5 MG/5ML syrup Take 5 mLs by mouth every 6 (six) hours as needed for cough.  04/13/15  Yes Historical Provider, MD  ibuprofen (ADVIL) 200 MG tablet Take 400 mg by mouth every 6 (six) hours as needed for mild pain. Reported on 03/09/2015   Yes Historical Provider, MD  benzonatate (TESSALON) 200 MG capsule Take 1 capsule (200 mg total) by mouth 3 (three) times daily as needed for cough. Patient not taking: Reported on 04/13/2015 01/05/15   Kyung Rudd, MD  chlorpheniramine-HYDROcodone (TUSSIONEX) 10-8 MG/5ML SUER Take 5 mLs by mouth every 12 (twelve) hours as needed for cough. Patient not taking: Reported on 05/31/2015 05/20/15   Susanne Borders, NP  erlotinib (TARCEVA) 150 MG tablet Take 1 tablet (150 mg total) by mouth daily. Take on an empty stomach 1 hour before meals or 2 hours after. Patient not taking: Reported on 05/20/2015 05/18/15   Curt Bears, MD  glimepiride (AMARYL) 2 MG tablet Take 1 tablet (2 mg total) by mouth daily with breakfast. Patient not taking: Reported on 05/31/2015 12/20/14   Nishant Dhungel, MD  methylPREDNISolone (MEDROL DOSEPAK) 4 MG TBPK tablet Medrol dose pak: take as  directed. Patient not taking: Reported on 05/31/2015 05/20/15   Susanne Borders, NP   Physical Exam: Filed Vitals:   05/31/15 2236 05/31/15 2245 06/01/15 0057  BP: 97/85 97/85 133/71  Pulse: 127 126 110  Temp: 98.6 F (37 C)    TempSrc: Oral    Resp: '20 23 24  '$ SpO2: 94% 94% 92%    Wt Readings from Last 3 Encounters:  05/20/15 71.578 kg (157 lb 12.8 oz)  05/08/15 79.289 kg (174 lb 12.8 oz)  04/29/15 79.289 kg (174 lb 12.8 oz)    General: Looks acutely ill.  Eyes: PERRL, normal lids, irises & conjunctiva ENT: grossly normal hearing, lips & tongue. Oral mucosa is dry.  Neck: no LAD, masses or thyromegaly Cardiovascular:Tachycardic, no m/r/g. No LE edema. Telemetry:Sinus tachycardia at 120 bpm  Respiratory: Decreased breath sounds on both bases, right > left, RLL and RML crackles. Abdomen: soft, ntnd Skin: no rash or induration seen on limited exam Musculoskeletal: grossly normal tone BUE/BLE Psychiatric: grossly normal mood and affect, speech fluent and appropriate Neurologic: Awake, alert, oriented 4 grossly non-focal.          Labs on Admission:  Basic Metabolic Panel:  Recent Labs Lab  05/31/15 2306  NA 128*  K 4.2  CL 94*  CO2 25  GLUCOSE 303*  BUN 10  CREATININE 0.71  CALCIUM 8.8*   CBC:  Recent Labs Lab 05/31/15 2306  WBC 15.1*  NEUTROABS 12.7*  HGB 12.0*  HCT 36.2*  MCV 82.1  PLT 515*    Radiological Exams on Admission: Dg Chest 2 View  05/31/2015  CLINICAL DATA:  Shortness of breath, right lower posterior rib pain, vomiting, chronic dry cough, hypoxia. EXAM: CHEST  2 VIEW COMPARISON:  Chest radiograph dated 05/03/2015. CT chest dated 05/08/2015. FINDINGS: Radiation changes in the left upper lobe and left lower lobe. Progressive patchy opacities in the right upper and lower lobes, significantly increased from the prior chest radiograph. While lymphangitic spread of tumor is possible when correlating with the prior CT, this appearance is also  worrisome for multifocal pneumonia. Small right pleural effusion.  No pneumothorax. The heart is normal size. Visualized osseous structures are within normal limits. IMPRESSION: Radiation changes in the left upper and left lower lobes. Progressive multifocal patchy opacities in the right lung, significantly increased. While lymphangitic spread of tumor is possible when correlating with prior CT, superimposed multifocal pneumonia is not excluded. Small right pleural effusion. Electronically Signed   By: Julian Hy M.D.   On: 05/31/2015 23:49    EKG: Independently reviewed. Vent. rate 124 BPM PR interval 146 ms QRS duration 89 ms QT/QTc 322/462 ms P-R-T axes 58 87 -88 Sinus tachycardia Nonspecific T abnormalities, diffuse leads   Assessment/Plan Principal Problem:   HCAP (healthcare-associated pneumonia) Admit to telemetry/inpatient. Continue supplemental oxygen. Continue  IV hydration. Continue cefepime and vancomycin. Follow-up blood cultures and sensitivity. Check a sputum culture and sensitivity. Check a strep pneumoniae and legionella urine antigen.  Active Problems:   Primary cancer of left upper lobe of lung (HCC) Currently off chemotherapy due to acute illness. Continue scheduled follow-ups with oncology. Resume oral chemotherapeutic agent once cleared by oncology.    Bone metastasis (HCC) Analgesics as needed.    Diabetes mellitus type 2 in nonobese (HCC) Carbohydrate modified diet. CBG monitoring with regular insulin sliding scale.     Code Status: Full code. DVT Prophylaxis: Lovenox SQ. Family Communication: His wife and son were present in the room. Disposition Plan: Admit for further evaluation and treatment.   Time spent: Over 70 minutes was spent during the procedure this admission.   Reubin Milan, MD [facility] Pager [phone].

## 2015-06-01 NOTE — Progress Notes (Signed)
PHARMACIST - PHYSICIAN COMMUNICATION Key Points: Use following P&T approved IV to PO antibiotic change policy. Description contains the criteria that are approved Note: Policy Excludes:  Esophagectomy patients  DR:  [facility] CONCERNING: IV to Oral Route Change Policy  RECOMMENDATION: This patient is receiving pantoprazole by the intravenous route.  Based on criteria approved by the Pharmacy and Therapeutics Committee, the intravenous medication(s) is/are being converted to the equivalent oral dose form(s).   DESCRIPTION: These criteria include:  The patient is eating (either orally or via tube) and/or has been taking other orally administered medications for a least 24 hours  The patient has no evidence of active gastrointestinal bleeding or impaired GI absorption (gastrectomy, short bowel, patient on TNA or NPO).  If you have questions about this conversion, please contact the Pharmacy Department  '[]'$   [phone] )  [facility] '[]'$   [phone] )  [facility] '[]'$   [phone] )  [facility] '[]'$   [phone] )  [facility] '[x]'$   [phone] )  [place], [place], [facility] 06/01/2015 1:03 PM

## 2015-06-01 NOTE — Progress Notes (Signed)
[facility] PROGRESS NOTE  Steven Roberts QBH:[id number] DOB: 1965-06-29 DOA: 05/31/2015 PCP: Pcp Not In System  HPI/Subjective: Reports that he is feeling better, though remains SOB at times with a dry cough. Seen with his wife at bedside.  Assessment/Plan:  HCAP - treated for CAP 3 weeks ago - presented to the ER on 04/17 with pleuritic chest pain, palpitation, and SOB with dry cough for several days   - CXR on 04/17 in ER showed progressive multifocal patchy opacities in the right lung - lymphangitic spread of tumor vs. Superimposed multifocal pneumonia. Admitted for HCAP treatment and started on cefepime and vancomycin. - CT chest on 03/25 showed progression of widespread pulmonary disease with probable diffuse lymphangitic spread of tumor throughout the right upper and middle lobes - today is afebrile with nonproductive cough and clear lungs, WBC 9.9- when CXR correlated with CT chest, presentation likely lymphangitic spread of cancer and less likely superimposed HCAP.  - will discontinue antibiotics and monitor, blood and sputum cultures pending  Primary cancer of left upper lobe of lung - non-small cell lung carcinoma of the left upper lobe; treated chemotherapy + radiation - followed by Dr.Mohamed who stopped his Tarceva 2-3 weeks ago after patient was admitted for treatment of CAP- will make him aware of patient's admission - it is likely that that primary cancer in the left upper lobe of the left lung has spread lymphangitically to the right side  - EGFR mutation studies to be discussed with Dr. Julien Nordmann and the office.  Bone metastasis - continue Fentanyl and vicodin as needed for pain  Pleuritic chest pain - EKG sinus tachycardia, troponin negative - chest pain likely from lymphangitic spread of cancer with possible small area of HCAP, very unlikely cardiac in origin  Nonproductive cough - continue dextromethorphan and hycodan as needed  Type 2 diabetes -  carbohydrate modified diet - CBGs stable - CBG monitoring with regular insulin sliding scale.  Code Status: Full Family Communication: Wife at bedside Disposition Plan: Remain inpatient   Consultants:  None  Procedures:  None  Antibiotics:  Cefepime, vancomycin (started 04/17, discontinued 04/18)   Objective: Filed Vitals:   06/01/15 0139 06/01/15 0503  BP: 114/59 111/63  Pulse: 103 86  Temp: 98.2 F (36.8 C) 97.9 F (36.6 C)  Resp:  22    Intake/Output Summary (Last 24 hours) at 06/01/15 1225 Last data filed at 06/01/15 0973  Gross per 24 hour  Intake 823.33 ml  Output    250 ml  Net 573.33 ml   Filed Weights   06/01/15 0139  Weight: 72.394 kg (159 lb 9.6 oz)    Exam:   General:  Awake, alert, no apparent distress  Cardiovascular: RRR, no LE edema   Respiratory: CTAB, no wheezes or crackles; respiratory effort normal  Abdomen: Soft, nontender, nondistended  Data Reviewed: Basic Metabolic Panel:  Recent Labs Lab 05/31/15 2306 06/01/15 0447  NA 128* 134*  K 4.2 3.8  CL 94* 99*  CO2 25 27  GLUCOSE 303* 192*  BUN 10 9  CREATININE 0.71 0.58*  CALCIUM 8.8* 8.6*   Liver Function Tests:  Recent Labs Lab 06/01/15 0447  AST 17  ALT 32  ALKPHOS 143*  BILITOT 0.4  PROT 5.6*  ALBUMIN 2.2*   No results for input(s): LIPASE, AMYLASE in the last 168 hours. No results for input(s): AMMONIA in the last 168 hours. CBC:  Recent Labs Lab 05/31/15 2306 06/01/15 0447  WBC 15.1* 9.9  NEUTROABS 12.7* 7.4  HGB 12.0* 10.8*  HCT 36.2* 32.7*  MCV 82.1 82.6  PLT 515* 443*   Cardiac Enzymes: No results for input(s): CKTOTAL, CKMB, CKMBINDEX, TROPONINI in the last 168 hours. BNP (last 3 results) No results for input(s): BNP in the last 8760 hours.  ProBNP (last 3 results) No results for input(s): PROBNP in the last 8760 hours.  CBG:  Recent Labs Lab 06/01/15 0738 06/01/15 1204  GLUCAP 155* 157*    No results found for this or any  previous visit (from the past 240 hour(s)).   Studies: Dg Chest 2 View  05/31/2015  CLINICAL DATA:  Shortness of breath, right lower posterior rib pain, vomiting, chronic dry cough, hypoxia. EXAM: CHEST  2 VIEW COMPARISON:  Chest radiograph dated 05/03/2015. CT chest dated 05/08/2015. FINDINGS: Radiation changes in the left upper lobe and left lower lobe. Progressive patchy opacities in the right upper and lower lobes, significantly increased from the prior chest radiograph. While lymphangitic spread of tumor is possible when correlating with the prior CT, this appearance is also worrisome for multifocal pneumonia. Small right pleural effusion.  No pneumothorax. The heart is normal size. Visualized osseous structures are within normal limits. IMPRESSION: Radiation changes in the left upper and left lower lobes. Progressive multifocal patchy opacities in the right lung, significantly increased. While lymphangitic spread of tumor is possible when correlating with prior CT, superimposed multifocal pneumonia is not excluded. Small right pleural effusion. Electronically Signed   By: Julian Hy M.D.   On: 05/31/2015 23:49    Scheduled Meds: . ceFEPime (MAXIPIME) IV  1 g Intravenous Q8H  . enoxaparin (LOVENOX) injection  40 mg Subcutaneous QHS  . [START ON 06/02/2015] fentaNYL  50 mcg Transdermal Q72H  . insulin aspart  0-15 Units Subcutaneous TID WC  . pantoprazole (PROTONIX) IV  40 mg Intravenous Q24H  . vancomycin  1,000 mg Intravenous Q12H   Continuous Infusions: . sodium chloride 1,000 mL (06/01/15 0932)    Principal Problem:   HCAP (healthcare-associated pneumonia) Active Problems:   Primary cancer of left upper lobe of lung ([place])   Bone metastasis ([place])   Diabetes mellitus type 2 in nonobese [facility])   Time spent: 25 minutes  Kara Dies  [facility] PA-S wrote parts of this note. If 7PM-7AM, please contact night-coverage at [email or web address], password [facility] 06/01/2015, 12:25 PM   LOS: 0 days       .Birdie Hopes Pager: [phone] 06/01/2015, 4:04 PM

## 2015-06-01 NOTE — Progress Notes (Signed)
Pharmacy Antibiotic Note  Steven Roberts is a 50 y.o. male admitted on 05/31/2015 with pneumonia.  Pharmacy has been consulted for Vancomycin, cefepime dosing.  Plan: Vancomycin 1gm IV every 12 hours.  Goal trough 15-20 mcg/mL.  Cefepime 2gm iv q8hr  Height: '5\' 7"'$  (170.2 cm) Weight: 159 lb 9.6 oz (72.394 kg) IBW/kg (Calculated) : 66.1  Temp (24hrs), Avg:98.4 F (36.9 C), Min:98.2 F (36.8 C), Max:98.6 F (37 C)   Recent Labs Lab 05/31/15 2306 06/01/15 0031  WBC 15.1*  --   CREATININE 0.71  --   LATICACIDVEN  --  1.67    Estimated Creatinine Clearance: 106.7 mL/min (by C-G formula based on Cr of 0.71).    No Known Allergies  Antimicrobials this admission: Vancomycin 06/01/2015 >> Cefepime 06/01/2015 >>   Dose adjustments this admission: -  Microbiology results: pending  Thank you for allowing pharmacy to be a part of this patient's care.  Nani Skillern [place]ford 06/01/2015 3:58 AM

## 2015-06-02 DIAGNOSIS — E860 Dehydration: Secondary | ICD-10-CM

## 2015-06-02 DIAGNOSIS — R0602 Shortness of breath: Secondary | ICD-10-CM | POA: Diagnosis present

## 2015-06-02 DIAGNOSIS — E119 Type 2 diabetes mellitus without complications: Secondary | ICD-10-CM

## 2015-06-02 LAB — CBC WITH DIFFERENTIAL/PLATELET
Basophils Absolute: 0 10*3/uL (ref 0.0–0.1)
Basophils Relative: 0 %
Eosinophils Absolute: 0.6 10*3/uL (ref 0.0–0.7)
Eosinophils Relative: 6 %
HEMATOCRIT: 33.6 % — AB (ref 39.0–52.0)
HEMOGLOBIN: 11.2 g/dL — AB (ref 13.0–17.0)
LYMPHS ABS: 0.5 10*3/uL — AB (ref 0.7–4.0)
LYMPHS PCT: 4 %
MCH: 26.9 pg (ref 26.0–34.0)
MCHC: 33.3 g/dL (ref 30.0–36.0)
MCV: 80.8 fL (ref 78.0–100.0)
MONO ABS: 1.1 10*3/uL — AB (ref 0.1–1.0)
MONOS PCT: 11 %
NEUTROS ABS: 8.3 10*3/uL — AB (ref 1.7–7.7)
Neutrophils Relative %: 79 %
Platelets: 485 10*3/uL — ABNORMAL HIGH (ref 150–400)
RBC: 4.16 MIL/uL — ABNORMAL LOW (ref 4.22–5.81)
RDW: 14.1 % (ref 11.5–15.5)
WBC: 10.1 10*3/uL (ref 4.0–10.5)

## 2015-06-02 LAB — COMPREHENSIVE METABOLIC PANEL
ALBUMIN: 2.1 g/dL — AB (ref 3.5–5.0)
ALT: 29 U/L (ref 17–63)
AST: 15 U/L (ref 15–41)
Alkaline Phosphatase: 151 U/L — ABNORMAL HIGH (ref 38–126)
Anion gap: 8 (ref 5–15)
BILIRUBIN TOTAL: 0.7 mg/dL (ref 0.3–1.2)
BUN: 6 mg/dL (ref 6–20)
CO2: 27 mmol/L (ref 22–32)
CREATININE: 0.53 mg/dL — AB (ref 0.61–1.24)
Calcium: 8.4 mg/dL — ABNORMAL LOW (ref 8.9–10.3)
Chloride: 97 mmol/L — ABNORMAL LOW (ref 101–111)
GFR calc Af Amer: >60 mL/min (ref >60)
GLUCOSE: 169 mg/dL — AB (ref 65–99)
POTASSIUM: 3.6 mmol/L (ref 3.5–5.1)
Sodium: 132 mmol/L — ABNORMAL LOW (ref 135–145)
Total Protein: 5.7 g/dL — ABNORMAL LOW (ref 6.5–8.1)

## 2015-06-02 LAB — GLUCOSE, CAPILLARY
GLUCOSE-CAPILLARY: 165 mg/dL — AB (ref 65–99)
Glucose-Capillary: 203 mg/dL — ABNORMAL HIGH (ref 65–99)

## 2015-06-02 LAB — LEGIONELLA PNEUMOPHILA SEROGP 1 UR AG: L. PNEUMOPHILA SEROGP 1 UR AG: NEGATIVE

## 2015-06-02 MED ORDER — BENZONATATE 100 MG PO CAPS
200.0000 mg | ORAL_CAPSULE | Freq: Three times a day (TID) | ORAL | Status: DC
Start: 2015-06-02 — End: 2015-06-03
  Administered 2015-06-02 – 2015-06-03 (×4): 200 mg via ORAL
  Filled 2015-06-02 (×4): qty 2

## 2015-06-02 MED ORDER — ENSURE ENLIVE PO LIQD
237.0000 mL | Freq: Two times a day (BID) | ORAL | Status: DC
  Administered 2015-06-02 – 2015-06-03 (×3): 237 mL via ORAL

## 2015-06-02 NOTE — Progress Notes (Signed)
Initial Nutrition Assessment  DOCUMENTATION CODES:   Not applicable  INTERVENTION:  -Ensure Enlive po BID, each supplement provides 350 kcal and 20 grams of protein -RD continue to monitor   NUTRITION DIAGNOSIS:   Inadequate oral intake related to chronic illness, cancer and cancer related treatments, poor appetite as evidenced by per patient/family report, percent weight loss.  GOAL:   Patient will meet greater than or equal to 90% of their needs  MONITOR:   PO intake, Labs, I & O's, Skin, Supplement acceptance  REASON FOR ASSESSMENT:   Malnutrition Screening Tool    ASSESSMENT:   Steven Roberts is a 50 y.o. 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  Spoke with pt, wife at bedside who occasionally translated for patient. Wife states patient has an appetite that comes and goes. Sometimes it is good, sometimes it is bad, but it has been more on the good side than the bad side. They deny weight loss, but per chart patient exhibits a 24#/13% severe wt loss in 2 months. Patient states usual weight to be 185# as of 02/2015. They deny any acute complaints. He does drink ensure at home, will provide during stay.  Patient has NSCLC w/ positive EGFR mutation; family was asked to look at [facility], but informed me the medication is $10,000 and they will be unable to afford on their own. They are in the process of filling paper work for assistance.  Labs: CBGs 165-203; Na 132; Ca 8.4 Medications reviewed    Diet Order:  Diet heart healthy/carb modified Room service appropriate?: Yes; Fluid consistency:: Thin  Skin:  Reviewed, no issues  Last BM:  4/18  Height:   Ht Readings from Last 1 Encounters:  06/01/15 _0  (1.702 m)     Weight:   Wt Readings from Last 1 Encounters:  06/01/15 159 lb 9.6 oz (72.394 kg)    Ideal Body Weight:  67.27 kg  BMI:  Body mass index is 24.99 kg/(m^2).  Estimated Nutritional Needs:   Kcal:  2150-2550  Protein:  85-110 grams  Fluid:  >/= 2 L  EDUCATION NEEDS:   No education needs identified at this time  Satira Anis. Napolean Sia, MS, RD LDN After Hours/Weekend Pager [phone]

## 2015-06-02 NOTE — Care Management Note (Signed)
Case Management Note  Patient Details  Name: Steven Roberts MRN: [id number] Date of Birth: December 04, 1965  Subjective/Objective:  Referral for med asst for Tagrisso '80mg'$  1 tab po qd x30days-[facility] script RN:HAFB of med $11,000.00-patient has no insurance-financial counselor-working on medicaid application.There is no case mamangement med asst program for this amount.[facility]-has started expidited process for med through the pharmaceutical company for the med to be given for free-he may not have an answer until Monday. I will call him tomorrow to find out the status.Patient does still have Tarcevo-over a months supply,but this med is not working.  MD still providing medical treatment, & diagnostics.  Patient ordered for home 02-[facility] rep notified of possible d/c in am.   MD updated.              Action/Plan:d/c plan home.   Expected Discharge Date:                  Expected Discharge Plan:  Home/Self Care  In-House Referral:     Discharge planning Services  CM Consult, Medication Assistance  Post Acute Care Choice:    Choice offered to:     DME Arranged:    DME Agency:     HH Arranged:    HH Agency:     Status of Service:  In process, will continue to follow  Medicare Important Message Given:    Date Medicare IM Given:    Medicare IM give by:    Date Additional Medicare IM Given:    Additional Medicare Important Message give by:     If discussed at [place] of Stay Meetings, dates discussed:    Additional Comments:  Dessa Phi, RN 06/02/2015, 3:41 PM

## 2015-06-02 NOTE — Progress Notes (Signed)
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 Chest x-ray was initially concerning for pneumonia however CT angiogram chest done on 05/08/2015 was consistent with progression of metastatic disease with lymphangitis. Patient was initially placed on IV antibiotics which were discontinued after 24 hours. Patients in consultation by oncology who recommended change of medication to [place]. 2-D echo and EKG pending.   Assessment & Plan:   Principal Problem:   SOB (shortness of breath) Active Problems:   Primary cancer of left upper lobe of lung (HCC)   Bone metastasis (HCC)   Diabetes mellitus type 2 in nonobese (HCC)   Dehydration  #1 shortness of breath Likely secondary to lymphangitic spread of metastatic non-small cell lung cancer noted on chest x-ray and CT chest. Patient with cough and shortness of breath. Leukocytosis has resolved. Patient was empirically on IV antibiotics for 24 hours which have been discontinued. Patient has been seen in consultation by oncology and recommended starting patient on Tagrisso as soon as possible. 2-D echo pending as well as EKG to rule out QTc prolongation. Patient will likely need home O2. Oncology following.  #2 metastatic non-small cell lung cancer with bone metastases Patient has been seen by oncology. EKG pending to rule out QTc prolongation. 2-D echo pending to evaluate ejection fraction. Patient to be started on Tagrisso per oncology recommendations. Continue a management with fentanyl and Vicodin. Per oncology.  #3 dehydration IV fluids.  #4 diabetes mellitus Continue carb modified diet. Sliding scale  insulin.  #5 nonproductive cough Likely secondary to problem #2. Placed on [employer] Hycodan as needed.  #6 pleuritic chest pain Secondary to problem #2. Troponins negative. Follow.     DVT prophylaxis: Lovenox Code Status: Full Family Communication: Updated patient and wife at bedside. Disposition Plan: Home hopefully tomorrow.   Consultants:   Oncology: Dr. Lorna Few 06/01/2015  Procedures:  2-D echo pending  Chest x-ray 05/31/2015  Antimicrobials:   IV vancomycin 05/31/2015>>>>> 06/01/2015  IV cefepime 05/31/2015>>>> 06/01/2015   Subjective: Patient states no significant change in shortness of breath. No chest pain. No nausea. No vomiting.  Objective: Filed Vitals:   06/01/15 2100 06/02/15 0138 06/02/15 0530 06/02/15 1342  BP: 111/78 135/68 131/62 132/69  Pulse: 110 110 114 108  Temp: 98.8 F (37.1 C) 99.3 F (37.4 C) 98.2 F (36.8 C) 98.3 F (36.8 C)  TempSrc: Oral Oral Oral Oral  Resp: '22 22 25 18  '$ Height:      Weight:      SpO2: 88% 95% 93% 92%    Intake/Output Summary (Last 24 hours) at 06/02/15 1528 Last data filed at 06/02/15 1204  Gross per 24 hour  Intake    600 ml  Output   1425 ml  Net   -825 ml   Filed Weights   06/01/15 0139  Weight: 72.394 kg (159 lb 9.6 oz)    Examination:  General exam: Appears calm and comfortable. NACPD Respiratory system: Clear to auscultation. Respiratory effort normal. Cardiovascular system: S1 & S2 heard, RRR. No JVD, murmurs, rubs, gallops or clicks. No pedal edema. Gastrointestinal system: Abdomen is nondistended, soft and nontender. No organomegaly or masses felt. Normal bowel sounds heard. Central nervous system: Alert  and oriented. No focal neurological deficits. Extremities: Symmetric 5 x 5 strength. No c/c/e Skin: No rashes, lesions or ulcers Psychiatry: Judgement and insight appear normal. Mood & affect appropriate.     Data Reviewed: I have personally reviewed following labs  and imaging studies  CBC:  Recent Labs Lab 05/31/15 2306 06/01/15 0447 06/02/15 0436  WBC 15.1* 9.9 10.1  NEUTROABS 12.7* 7.4 8.3*  HGB 12.0* 10.8* 11.2*  HCT 36.2* 32.7* 33.6*  MCV 82.1 82.6 80.8  PLT 515* 443* 101*   Basic Metabolic Panel:  Recent Labs Lab 05/31/15 2306 06/01/15 0447 06/02/15 0436  NA 128* 134* 132*  K 4.2 3.8 3.6  CL 94* 99* 97*  CO2 '25 27 27  '$ GLUCOSE 303* 192* 169*  BUN '10 9 6  '$ CREATININE 0.71 0.58* 0.53*  CALCIUM 8.8* 8.6* 8.4*   GFR: Estimated Creatinine Clearance: 106.7 mL/min (by C-G formula based on Cr of 0.53). Liver Function Tests:  Recent Labs Lab 06/01/15 0447 06/02/15 0436  AST 17 15  ALT 32 29  ALKPHOS 143* 151*  BILITOT 0.4 0.7  PROT 5.6* 5.7*  ALBUMIN 2.2* 2.1*   No results for input(s): LIPASE, AMYLASE in the last 168 hours. No results for input(s): AMMONIA in the last 168 hours. Coagulation Profile: No results for input(s): INR, PROTIME in the last 168 hours. Cardiac Enzymes: No results for input(s): CKTOTAL, CKMB, CKMBINDEX, TROPONINI in the last 168 hours. BNP (last 3 results) No results for input(s): PROBNP in the last 8760 hours. HbA1C: No results for input(s): HGBA1C in the last 72 hours. CBG:  Recent Labs Lab 06/01/15 1204 06/01/15 1714 06/01/15 2309 06/02/15 0803 06/02/15 1203  GLUCAP 157* 167* 167* 165* 203*   Lipid Profile: No results for input(s): CHOL, HDL, LDLCALC, TRIG, CHOLHDL, LDLDIRECT in the last 72 hours. Thyroid Function Tests: No results for input(s): TSH, T4TOTAL, FREET4, T3FREE, THYROIDAB in the last 72 hours. Anemia Panel: No results for input(s): VITAMINB12, FOLATE, FERRITIN, TIBC, IRON, RETICCTPCT in the last 72 hours. Urine analysis:    Component Value Date/Time   COLORURINE YELLOW 05/08/2015 [place] 05/08/2015 0938   LABSPEC 1.019 05/08/2015 0938   PHURINE 7.5 05/08/2015 0938   GLUCOSEU NEGATIVE 05/08/2015 0938   HGBUR NEGATIVE 05/08/2015 0938    BILIRUBINUR NEGATIVE 05/08/2015 0938   KETONESUR NEGATIVE 05/08/2015 0938   PROTEINUR NEGATIVE 05/08/2015 0938   UROBILINOGEN 0.2 12/10/2014 1553   NITRITE NEGATIVE 05/08/2015 0938   LEUKOCYTESUR NEGATIVE 05/08/2015 0938   Sepsis Labs: '@LABRCNTIP'$ (procalcitonin:4,lacticidven:4)  ) Recent Results (from the past 240 hour(s))  Culture, blood (routine x 2)     Status: None (Preliminary result)   Collection Time: 06/01/15 12:27 AM  Result Value Ref Range Status   Specimen Description BLOOD RIGHT ANTECUBITAL  Final   Special Requests BOTTLES DRAWN AEROBIC AND ANAEROBIC 5ML  Final   Culture   Final    NO GROWTH 1 DAY Performed at [facility]    Report Status PENDING  Incomplete  Culture, blood (routine x 2)     Status: None (Preliminary result)   Collection Time: 06/01/15 12:32 AM  Result Value Ref Range Status   Specimen Description BLOOD LEFT FOREARM  Final   Special Requests BOTTLES DRAWN AEROBIC AND ANAEROBIC 5ML  Final   Culture   Final    NO GROWTH 1 DAY Performed at [facility]    Report Status PENDING  Incomplete         Radiology Studies: Dg Chest  2 View  05/31/2015  CLINICAL DATA:  Shortness of breath, right lower posterior rib pain, vomiting, chronic dry cough, hypoxia. EXAM: CHEST  2 VIEW COMPARISON:  Chest radiograph dated 05/03/2015. CT chest dated 05/08/2015. FINDINGS: Radiation changes in the left upper lobe and left lower lobe. Progressive patchy opacities in the right upper and lower lobes, significantly increased from the prior chest radiograph. While lymphangitic spread of tumor is possible when correlating with the prior CT, this appearance is also worrisome for multifocal pneumonia. Small right pleural effusion.  No pneumothorax. The heart is normal size. Visualized osseous structures are within normal limits. IMPRESSION: Radiation changes in the left upper and left lower lobes. Progressive multifocal patchy opacities in the right lung,  significantly increased. While lymphangitic spread of tumor is possible when correlating with prior CT, superimposed multifocal pneumonia is not excluded. Small right pleural effusion. Electronically Signed   By: Julian Hy M.D.   On: 05/31/2015 23:49        Scheduled Meds: . benzonatate  200 mg Oral TID  . enoxaparin (LOVENOX) injection  40 mg Subcutaneous QHS  . fentaNYL  50 mcg Transdermal Q72H  . insulin aspart  0-15 Units Subcutaneous TID WC  . pantoprazole  40 mg Oral Daily   Continuous Infusions:    LOS: 1 day    Time spent: 28 mins    THOMPSON,DANIEL, MD [facility] Pager [phone]  If 7PM-7AM, please contact night-coverage [email or web address] Password [facility] 06/02/2015, 3:28 PM

## 2015-06-03 ENCOUNTER — Inpatient Hospital Stay (HOSPITAL_COMMUNITY): Payer: Medicaid Other

## 2015-06-03 ENCOUNTER — Telehealth: Payer: Self-pay | Admitting: Internal Medicine

## 2015-06-03 ENCOUNTER — Encounter (HOSPITAL_COMMUNITY): Payer: Self-pay | Admitting: Internal Medicine

## 2015-06-03 DIAGNOSIS — R000 Tachycardia, unspecified: Secondary | ICD-10-CM | POA: Diagnosis present

## 2015-06-03 DIAGNOSIS — I272 Pulmonary hypertension, unspecified: Secondary | ICD-10-CM

## 2015-06-03 DIAGNOSIS — R0600 Dyspnea, unspecified: Secondary | ICD-10-CM

## 2015-06-03 HISTORY — DX: Pulmonary hypertension, unspecified: I27.20

## 2015-06-03 LAB — CBC WITH DIFFERENTIAL/PLATELET
BASOS PCT: 0 %
Basophils Absolute: 0 10*3/uL (ref 0.0–0.1)
Eosinophils Absolute: 0.5 10*3/uL (ref 0.0–0.7)
Eosinophils Relative: 4 %
HEMATOCRIT: 33.8 % — AB (ref 39.0–52.0)
HEMOGLOBIN: 11.1 g/dL — AB (ref 13.0–17.0)
LYMPHS ABS: 0.7 10*3/uL (ref 0.7–4.0)
LYMPHS PCT: 6 %
MCH: 26.7 pg (ref 26.0–34.0)
MCHC: 32.8 g/dL (ref 30.0–36.0)
MCV: 81.3 fL (ref 78.0–100.0)
MONO ABS: 1.6 10*3/uL — AB (ref 0.1–1.0)
MONOS PCT: 14 %
NEUTROS ABS: 8.9 10*3/uL — AB (ref 1.7–7.7)
NEUTROS PCT: 76 %
Platelets: 521 10*3/uL — ABNORMAL HIGH (ref 150–400)
RBC: 4.16 MIL/uL — ABNORMAL LOW (ref 4.22–5.81)
RDW: 14.2 % (ref 11.5–15.5)
WBC: 11.7 10*3/uL — ABNORMAL HIGH (ref 4.0–10.5)

## 2015-06-03 LAB — COMPREHENSIVE METABOLIC PANEL
ALBUMIN: 2.2 g/dL — AB (ref 3.5–5.0)
ALK PHOS: 168 U/L — AB (ref 38–126)
ALT: 23 U/L (ref 17–63)
ANION GAP: 9 (ref 5–15)
AST: 13 U/L — ABNORMAL LOW (ref 15–41)
BILIRUBIN TOTAL: 0.4 mg/dL (ref 0.3–1.2)
BUN: 6 mg/dL (ref 6–20)
CO2: 30 mmol/L (ref 22–32)
Calcium: 8.4 mg/dL — ABNORMAL LOW (ref 8.9–10.3)
Chloride: 93 mmol/L — ABNORMAL LOW (ref 101–111)
Creatinine, Ser: 0.47 mg/dL — ABNORMAL LOW (ref 0.61–1.24)
GFR calc Af Amer: >60 mL/min (ref >60)
GLUCOSE: 167 mg/dL — AB (ref 65–99)
POTASSIUM: 3.6 mmol/L (ref 3.5–5.1)
Sodium: 132 mmol/L — ABNORMAL LOW (ref 135–145)
Total Protein: 5.8 g/dL — ABNORMAL LOW (ref 6.5–8.1)

## 2015-06-03 LAB — ECHOCARDIOGRAM COMPLETE
HEIGHTINCHES: 67 in
Weight: 2553.6 oz

## 2015-06-03 LAB — GLUCOSE, CAPILLARY
GLUCOSE-CAPILLARY: 204 mg/dL — AB (ref 65–99)
GLUCOSE-CAPILLARY: 168 mg/dL — AB (ref 65–99)
Glucose-Capillary: 219 mg/dL — ABNORMAL HIGH (ref 65–99)
Glucose-Capillary: 247 mg/dL — ABNORMAL HIGH (ref 65–99)

## 2015-06-03 MED ORDER — BENZONATATE 200 MG PO CAPS: 200.0000 mg | ORAL_CAPSULE | Freq: Three times a day (TID) | ORAL | Status: DC

## 2015-06-03 MED ORDER — ALBUTEROL SULFATE (2.5 MG/3ML) 0.083% IN NEBU: 2.5000 mg | INHALATION_SOLUTION | Freq: Four times a day (QID) | RESPIRATORY_TRACT | Status: DC | PRN

## 2015-06-03 MED ORDER — ENSURE ENLIVE PO LIQD: 237.0000 mL | Freq: Two times a day (BID) | ORAL | Status: DC

## 2015-06-03 MED ORDER — PANTOPRAZOLE SODIUM 40 MG PO TBEC: 40.0000 mg | DELAYED_RELEASE_TABLET | Freq: Every day | ORAL | Status: DC

## 2015-06-03 MED ORDER — GLIMEPIRIDE 2 MG PO TABS: 2.0000 mg | ORAL_TABLET | Freq: Every day | ORAL | Status: DC

## 2015-06-03 MED ORDER — FENTANYL 50 MCG/HR TD PT72: 50.0000 ug | MEDICATED_PATCH | TRANSDERMAL | Status: DC

## 2015-06-03 MED ORDER — FLUTICASONE-SALMETEROL 250-50 MCG/DOSE IN AEPB: 1.0000 | INHALATION_SPRAY | Freq: Two times a day (BID) | RESPIRATORY_TRACT | Status: DC

## 2015-06-03 MED ORDER — TIOTROPIUM BROMIDE MONOHYDRATE 18 MCG IN CAPS: 18.0000 ug | ORAL_CAPSULE | Freq: Every day | RESPIRATORY_TRACT | Status: DC

## 2015-06-03 NOTE — Care Management Note (Signed)
Case Management Note  Patient Details  Name: Steven Roberts MRN: [id number] Date of Birth: 1966/01/14  Subjective/Objective: per [facility] dme rep Lecretia-patient doesn't qualify for neb machine w/dx: Lung ca. Will check on [facility] program for advair.MD notified.also spoke spouse-they can afford the neb machine$62.00-[facility] dme rep aware-await neb machine order.  Left message w/Cancer center nurse to contact patient for further assistance w/meds.MD updated.                 Action/Plan:d/c plan home.   Expected Discharge Date:                  Expected Discharge Plan:  Home/Self Care  In-House Referral:     Discharge planning Services  CM Consult, Medication Assistance  Post Acute Care Choice:    Choice offered to:     DME Arranged:  Oxygen DME Agency:  [place]:    [place] Agency:     Status of Service:  Completed, signed off  Medicare Important Message Given:    Date Medicare IM Given:    Medicare IM give by:    Date Additional Medicare IM Given:    Additional Medicare Important Message give by:     If discussed at [place] of Stay Meetings, dates discussed:    Additional Comments:  Dessa Phi, RN 06/03/2015, 4:06 PM

## 2015-06-03 NOTE — Progress Notes (Signed)
Echocardiogram 2D Echocardiogram has been performed.  Steven Roberts 06/03/2015, 12:38 PM

## 2015-06-03 NOTE — Care Management Note (Signed)
Case Management Note  Patient Details  Name: Steven Roberts MRN: [id number] Date of Birth: [birth year]  Subjective/Objective: [facility] dme will ship neb machine to patient's home. [place] program to be used,but not sure if it will work since spiriva, & advair are over $300.00 each.Will provide patient/spouse with [employer] program letter-they are aware that depending on the cost it may or may not be able to help with meds.MD also aware.Cancer center nurse, & sw-lauren have contacted me back & are aware to look @ resources that may be available also.                   Action/Plan:d/c home.   Expected Discharge Date:                  Expected Discharge Plan:  Home/Self Care  In-House Referral:     Discharge planning Services  CM Consult, Medication Assistance, [place] Program  Post Acute Care Choice:    Choice offered to:     DME Arranged:  Oxygen DME Agency:  [place]:    [place] Agency:     Status of Service:  Completed, signed off  Medicare Important Message Given:    Date Medicare IM Given:    Medicare IM give by:    Date Additional Medicare IM Given:    Additional Medicare Important Message give by:     If discussed at [place] of Stay Meetings, dates discussed:    Additional Comments:  Dessa Phi, RN 06/03/2015, 4:48 PM

## 2015-06-03 NOTE — Care Management Note (Signed)
Case Management Note  Patient Details  Name: Steven Roberts MRN: [id number] Date of Birth: 20-Sep-1965  Subjective/Objective:  Spoke to [place] is working with family on the expidited process for the cancer med-this process will take time.MD notified.[facility] dme rep Pura Spice aware of home 02 to bring to rm since attending will d/c today.                  Action/Plan:d/c home.   Expected Discharge Date:                  Expected Discharge Plan:  Home/Self Care  In-House Referral:     Discharge planning Services  CM Consult, Medication Assistance  Post Acute Care Choice:    Choice offered to:     DME Arranged:  Oxygen DME Agency:  [place]:    [place] Agency:     Status of Service:  Completed, signed off  Medicare Important Message Given:    Date Medicare IM Given:    Medicare IM give by:    Date Additional Medicare IM Given:    Additional Medicare Important Message give by:     If discussed at [place] of Stay Meetings, dates discussed:    Additional Comments:  Dessa Phi, RN 06/03/2015, 11:37 AM

## 2015-06-03 NOTE — Progress Notes (Signed)
SATURATION QUALIFICATIONS: (This note is used to comply with regulatory documentation for home oxygen)  Patient Saturations on Room Air at Rest = 91%  Patient Saturations on Room Air while Ambulating = 84%  Patient Saturations on 2 Liters of oxygen while Ambulating 93%  Please briefly explain why patient needs home oxygen:

## 2015-06-03 NOTE — Telephone Encounter (Signed)
Kathy from hospital called to sched pt for f/u.Marland Kitchendone she will advise pt

## 2015-06-03 NOTE — Progress Notes (Signed)
[facility]    [facility] is providing the following services: Home Oxygen  If patient discharges after hours, please call [phone].   Linward Headland 06/03/2015, 3:30 PM

## 2015-06-03 NOTE — Discharge Summary (Signed)
Physician Discharge Summary  Steven Roberts WNI:[id number] DOB: [birth year] DOA: 05/31/2015  PCP: Pcp Not In System  Admit date: 05/31/2015 Discharge date: 06/03/2015  Time spent: 65 minutes  Recommendations for Outpatient Follow-up #1 Patient will follow-up with Dr. Lorna Few of oncology in 1-2 weeks.   Discharge Diagnos es:  Principal Problem:   SOB (shortness of breath) Active Problems:   Primary cancer of left upper lobe of lung (HCC)   Bone metastasis (HCC)   Diabetes mellitus type 2 in nonobese ([place])   Dehydration   Pulmonary hypertension ([place]): Per 2 d echo 06/03/2015   Tachycardia   Discharge Condition: Stable  Diet recommendation: Regular  Filed Weights   06/01/15 0139  Weight: 72.394 kg (159 lb 9.6 oz)    History of present illness:  Per Dr Verner Chol is a 50 y.o. 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  Per patient, several days after he was discharged, he started feeling pleuritic chest pain and noticed that he was getting short of breath and fatigued a lot easier. He had also been having loss of appetite, night sweats, fever, chills and productive cough. He stated that at times, coughing was so intense, that he had nausea and emesis. He denied abdominal pain, diarrhea, melena or hematochezia, but complained of constipation. He denied GU symptoms.  When seen, the patient stated that he was feeling better with the treatment provided. He was in no acute distress. Workup in the emergency department showed worsening of his right infiltrates and leukocytosis  Hospital Course:  #1 shortness of breath Patient had presented with shortness of breath and pleuritic chest pain. Chest x-ray on admission was  initially concerning for pneumonia have a CT angiogram of the chest done on 05/08/2015 was consistent with progression of metastatic disease with lymphangitis. Likely secondary to lymphangitic spread of metastatic non-small cell lung cancer noted on chest x-ray and CT chest. Patient with cough and shortness of breath. Leukocytosis resolved. Patient was empirically on IV antibiotics for 24 hours which were discontinued. Patient has been seen in consultation by oncology and recommended starting patient on Tagrisso as soon as possible. 2-D echo was done with a EF of 65-70% with no wall motion abnormalities. Moderate to severe pulmonary hypertension. EKG was negative for QTc prolongation. Patient was O2 dependent and will be discharged home on home O2. Patient will follow-up with oncology as outpatient.   #2 metastatic non-small cell lung cancer with bone metastases Patient has been seen by oncology. EKG  QTc  negative forprolongation. 2-D echo with EF of 65-70% with no wall motion abnormalities. Moderate to severe pulmonary hypertension. Patient to be started on Tagrisso per oncology recommendations. Continued pain management with fentanyl and Vicodin. patient will follow-up with oncology as outpatient.   #3 dehydration Patient was hydrated with IV fluids and was euvolemic by day of discharge.   #4 diabetes mellitus Continued on a carb modified diet. Sliding scale insulin.  #5 nonproductive cough Likely secondary to problem #2. Placed on [employer] Hycodan as needed.  #6 pleuritic chest pain Secondary to problem #2. Troponins negative. CT chest was negative for PE. 2-D echo with EF of 65-70% with no wall motion abnormalities. Severe pericardial effusion from 12/11/2014 no longer present, no signs of tamponade. Moderate to  severe pulmonary hypertension. Follow.  Procedures:  2-D echo 06/03/2015  Chest x-ray 05/31/2015    Consultations:  Oncology: Dr. Lorna Few  06/01/2015  Discharge Exam: Filed Vitals:   06/03/15 0510 06/03/15 1357  BP: 127/67 128/60  Pulse: 113 118  Temp: 98.1 F (36.7 C) 98 F (36.7 C)  Resp: 20 18    General: NAD Cardiovascular: RRR Respiratory: CTAB  Discharge Instructions   Discharge Instructions    Diet general    Complete by:  As directed      Discharge instructions    Complete by:  As directed   Follow up with Dr Earlie Server in 1-2 weeks.     Increase activity slowly    Complete by:  As directed           Current Discharge Medication List    START taking these medications   Details  albuterol (PROVENTIL) (2.5 MG/3ML) 0.083% nebulizer solution Take 3 mLs (2.5 mg total) by nebulization every 6 (six) hours as needed for wheezing or shortness of breath. Qty: 75 mL, Refills: 3    feeding supplement, ENSURE ENLIVE, (ENSURE ENLIVE) LIQD Take 237 mLs by mouth 2 (two) times daily between meals. Qty: 237 mL, Refills: 0    Fluticasone-Salmeterol (ADVAIR) 250-50 MCG/DOSE AEPB Inhale 1 puff into the lungs 2 (two) times daily. Qty: 60 each, Refills: 0    pantoprazole (PROTONIX) 40 MG tablet Take 1 tablet (40 mg total) by mouth daily. Qty: 30 tablet, Refills: 0    tiotropium (SPIRIVA HANDIHALER) 18 MCG inhalation capsule Place 1 capsule (18 mcg total) into inhaler and inhale daily. Qty: 30 capsule, Refills: 12      CONTINUE these medications which have CHANGED   Details  benzonatate (TESSALON) 200 MG capsule Take 1 capsule (200 mg total) by mouth 3 (three) times daily. Qty: 30 capsule, Refills: 0    fentaNYL (DURAGESIC - DOSED MCG/HR) 50 MCG/HR Place 1 patch (50 mcg total) onto the skin every 3 (three) days. Qty: 10 patch, Refills: 0    glimepiride (AMARYL) 2 MG tablet Take 1 tablet (2 mg total) by mouth daily with breakfast. Qty: 30 tablet, Refills: 3      CONTINUE these medications which have NOT CHANGED   Details  dextromethorphan (DELSYM) 30 MG/5ML liquid Take 30 mg by mouth at bedtime as needed  for cough.    HYDROcodone-homatropine (HYCODAN) 5-1.5 MG/5ML syrup Take 5 mLs by mouth every 6 (six) hours as needed for cough.  Refills: 0    ibuprofen (ADVIL) 200 MG tablet Take 400 mg by mouth every 6 (six) hours as needed for mild pain. Reported on 03/09/2015    osimertinib mesylate (TAGRISSO) 80 MG tablet Take 1 tablet (80 mg total) by mouth daily. Qty: 30 tablet, Refills: 2   Associated Diagnoses: Primary cancer of left upper lobe of lung ([place]); Pulmonary infiltrates      STOP taking these medications     HYDROcodone-acetaminophen (NORCO/VICODIN) 5-325 MG tablet      chlorpheniramine-HYDROcodone (TUSSIONEX) 10-8 MG/5ML SUER      erlotinib (TARCEVA) 150 MG tablet      methylPREDNISolone (MEDROL DOSEPAK) 4 MG TBPK tablet        No Known Allergies Follow-up Information    Follow up with [facility].   Why:  home [address]   Contact information:   [address] [place] [address] [phone]       Follow up with Eilleen Kempf., MD. Schedule an appointment as  soon as possible for a visit on 06/10/2015.   Specialty:  Oncology   Why:  11:30a   Contact information:   [address] [place] [address] [phone]        The results of significant diagnostics from this hospitalization (including imaging, microbiology, ancillary and laboratory) are listed below for reference.    Significant Diagnostic Studies: Dg Chest 1 View  05/09/2015  CLINICAL DATA:  Post right-sided thoracentesis EXAM: CHEST 1 VIEW COMPARISON:  None. FINDINGS: Grossly unchanged cardiac silhouette and mediastinal contours with partial obscuration of the left heart border secondary to all grossly unchanged extensive left mid lung heterogeneous airspace opacities. There is diffuse nodular thickening of the pulmonary interstitium primarily about the right perihilar lung. No change to slight reduction of right-sided pleural effusion post thoracentesis parallel thorax.  Unchanged suspected trace left-sided pleural effusion. Unchanged bones. IMPRESSION: 1. No change to slight reduction in trace right-sided effusion post thoracentesis. No pneumothorax. 2. Similar findings again worrisome for extensive bilateral metastatic disease though note, underlying infection is not excluded. Electronically Signed   By: Sandi Mariscal M.D.   On: 05/09/2015 12:23   Dg Chest 2 View  05/31/2015  CLINICAL DATA:  Shortness of breath, right lower posterior rib pain, vomiting, chronic dry cough, hypoxia. EXAM: CHEST  2 VIEW COMPARISON:  Chest radiograph dated 05/03/2015. CT chest dated 05/08/2015. FINDINGS: Radiation changes in the left upper lobe and left lower lobe. Progressive patchy opacities in the right upper and lower lobes, significantly increased from the prior chest radiograph. While lymphangitic spread of tumor is possible when correlating with the prior CT, this appearance is also worrisome for multifocal pneumonia. Small right pleural effusion.  No pneumothorax. The heart is normal size. Visualized osseous structures are within normal limits. IMPRESSION: Radiation changes in the left upper and left lower lobes. Progressive multifocal patchy opacities in the right lung, significantly increased. While lymphangitic spread of tumor is possible when correlating with prior CT, superimposed multifocal pneumonia is not excluded. Small right pleural effusion. Electronically Signed   By: Julian Hy M.D.   On: 05/31/2015 23:49   Dg Chest 2 View  05/08/2015  CLINICAL DATA:  50 year old male with a history of right-sided rib pain. EXAM: CHEST - 2 VIEW COMPARISON:  03/30/2015, 03/02/2015, prior chest x-ray 01/04/2015 FINDINGS: Cardiomediastinal silhouette unchanged in size and contour. Over the comparison chest x-ray and CT studies, the patient has had improved in worsening of bilateral airspace/interstitial opacities. The current pattern of disease is worst in the most recent chest x-ray of  01/04/2015, and compared across modalities to the CT 03/30/2015. There are persisted interstitial opacities in the left greater than right mid lung and at the base on the lateral view. Unremarkable appearance of the upper abdomen. IMPRESSION: Multifocal, left greater than right airspace disease, compatible with multifocal infection. The patient has had waxing and waning disease over the course of the comparison plain film and CT. Followup PA and lateral chest X-ray is recommended in 3-4 weeks following trial of antibiotic therapy to ensure resolution and exclude underlying malignancy. Signed, Dulcy Fanny. Earleen Newport, DO [facility] Radiology Electronically Signed   By: Corrie Mckusick D.O.   On: 05/08/2015 09:58   Ct Angio Chest Pe W/cm &/or Wo Cm  05/08/2015  ADDENDUM REPORT: 05/08/2015 11:28 ADDENDUM: To be more specific, if this does represent progression of metastatic disease, the pattern of spread is more likely a combination of lymphangitis and endobronchial in nature. Additionally, in the  appropriate clinical setting, a multi lobar pneumonia or atypical/fungal infectious process could have a similar radiographic appearance. Bronchoscopy may be helpful for further evaluation. Electronically Signed   By: Jacqulynn Cadet M.D.   On: 05/08/2015 11:28  05/08/2015  CLINICAL DATA:  50 year old male with right-sided rib pain and cough for the past week or longer. Patient completed left thoracic radiation therapy for metastatic non-small cell lung cancer in December of 2016. Patient also has a history of cardiac tamponade requiring subxiphoid pericardial window and right-sided chest tube placement EXAM: CT ANGIOGRAPHY CHEST WITH CONTRAST TECHNIQUE: Multidetector CT imaging of the chest was performed using the standard protocol during bolus administration of intravenous contrast. Multiplanar CT image reconstructions and MIPs were obtained to evaluate the vascular anatomy.  CONTRAST:  100 mL Isovue 370 COMPARISON:  Most recent prior chest CT 03/30/2015 FINDINGS: Mediastinum: Similar appearance of left supraclavicular adenopathy. The largest nodal conglomerate measures approximately 2.8 x 1.8 cm which may be slightly smaller than previously measured at 3.3 x 2.4 cm. No definite mediastinal mass or adenopathy. Unremarkable thoracic esophagus. Heart/Vascular: Adequate opacification of the pulmonary arteries to the proximal subsegmental level. No evidence of acute pulmonary embolus. The heart is normal in size. No pericardial effusion. Normal caliber thoracic aorta. Lungs/Pleura: Moderate layering right-sided pleural effusion is new compared to February of 2017. Significant interval progression of metastatic disease bilaterally. The dominant nodule in the left upper lobe now measures 1.1 x 1.4 cm compared to 0.9 x 1.2 cm. Additionally, there has been extensive progression of diffuse bronchial wall thickening and tree-in-bud micro and macro nodularity throughout the right upper lung concerning for progressive lymphangitis spread of the disease. There is new focal segmental atelectasis in the posterior aspect of the right middle lobe secondary to bronchial obstruction. Additionally, there is been significant interval progression of consolidation, volume loss and architectural distortion in the left upper and lower lobes in the region of prior radiation therapy. 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 Innumerable small pulmonary nodules throughout the left lower lobe are more prominent and greater in number. Bones/Soft Tissues: Stable blastic metastases in the T1 and T2 vertebral bodies without significant interval change. Upper Abdomen: Stable early enhancing lesion in hepatic segment 3 likely representative of a hemangioma. Relatively circumscribed low-attenuation lesion in the caudate is stable at 1.8 cm. Review of the MIP images confirms the above findings. IMPRESSION: 1.  Negative for acute pulmonary embolus. 2. CT findings suggest significant interval progression of widespread pulmonary metastatic disease with probable diffuse lymphangitic spread of tumor throughout the right upper and middle lobes. Additionally, there is increasing extensive nodularity in the left lower lobe concerning for additional foci of metastatic disease. Enlarging left upper lobe metastatic nodule. 3. Interval reaccumulation of a moderate layering right-sided pleural effusion which is likely malignant. 4. Progressive consolidation, volume loss and architectural distortion in the left upper and lower lobes in the region of prior radiation therapy. 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 5. Stable T1 and T2 blastic osseous metastatic disease. No definite new bony lesion or evidence of pathologic fracture. 6. Slightly decreased left supraclavicular adenopathy. 7. Stable liver lesions 1 of which is almost certainly a hemangioma. Electronically Signed: By: Jacqulynn Cadet M.D. On: 05/08/2015 11:04   Dg Chest Port 1 View  05/11/2015  CLINICAL DATA:  Pleuritic chest pain, shortness of breath and weakness. Known left upper lobe lung cancer with bone metastases. EXAM: PORTABLE CHEST 1 VIEW COMPARISON:  05/09/2015, 05/08/2015 and CT 05/08/2015 FINDINGS: Exam demonstrates low lung volumes  with continued focal opacification of the left midlung. Slight improved aeration over the right infrahilar region. Continued nodule prominence of the interstitium bilaterally with possible slight improvement. No significant effusion. Cardiomediastinal silhouette and remainder of the exam is unchanged. IMPRESSION: Stable opacification of the left midlung with slight improved aeration over the right infrahilar opacification. Slight improved nodular prominence of the interstitium bilaterally. Findings likely due to patient's underlying pulmonary neoplasm, although superimposed infection is not excluded. Electronically Signed    By: Marin Olp M.D.   On: 05/11/2015 11:08   US Thoracentesis Asp Pleural Space W/img Guide  05/09/2015  INDICATION: Right sided rib pain and cough. History of metastatic non-small cell lung cancer. Request for diagnostic and therapeutic thoracentesis. EXAM: ULTRASOUND GUIDED RIGHT THORACENTESIS MEDICATIONS: 1% Lidocaine. COMPLICATIONS: None immediate. PROCEDURE: An ultrasound guided thoracentesis was thoroughly discussed with the patient and questions answered. The benefits, risks, alternatives and complications were also discussed. The patient understands and wishes to proceed with the procedure. Written consent was obtained. Ultrasound was performed to localize and mark an adequate pocket of fluid in the right chest. The area was then prepped and draped in the normal sterile fashion. 1% Lidocaine was used for local anesthesia. Under ultrasound guidance a 6 Fr Safe-T-Centesis catheter was introduced. Thoracentesis was performed. The catheter was removed and a dressing applied. FINDINGS: A total of approximately 580 ml of clear yellow fluid was removed. Samples were sent to the laboratory as requested by the clinical team. IMPRESSION: Successful ultrasound guided right thoracentesis yielding 580 ml of pleural fluid. Read by:  Gareth Eagle, PA-C Electronically Signed   By: Sandi Mariscal M.D.   On: 05/09/2015 11:54    Microbiology: Recent Results (from the past 240 hour(s))  Culture, blood (routine x 2)     Status: None (Preliminary result)   Collection Time: 06/01/15 12:27 AM  Result Value Ref Range Status   Specimen Description BLOOD RIGHT ANTECUBITAL  Final   Special Requests BOTTLES DRAWN AEROBIC AND ANAEROBIC 5ML  Final   Culture   Final    NO GROWTH 2 DAYS Performed at [facility]    Report Status PENDING  Incomplete  Culture, blood (routine x 2)     Status: None (Preliminary result)   Collection Time: 06/01/15 12:32 AM  Result Value Ref Range Status   Specimen Description BLOOD  LEFT FOREARM  Final   Special Requests BOTTLES DRAWN AEROBIC AND ANAEROBIC 5ML  Final   Culture   Final    NO GROWTH 2 DAYS Performed at [facility]    Report Status PENDING  Incomplete     Labs: Basic Metabolic Panel:  Recent Labs Lab 05/31/15 2306 06/01/15 0447 06/02/15 0436 06/03/15 0449  NA 128* 134* 132* 132*  K 4.2 3.8 3.6 3.6  CL 94* 99* 97* 93*  CO2 '25 27 27 30  '$ GLUCOSE 303* 192* 169* 167*  BUN '10 9 6 6  '$ CREATININE 0.71 0.58* 0.53* 0.47*  CALCIUM 8.8* 8.6* 8.4* 8.4*   Liver Function Tests:  Recent Labs Lab 06/01/15 0447 06/02/15 0436 06/03/15 0449  AST 17 15 13*  ALT 32 29 23  ALKPHOS 143* 151* 168*  BILITOT 0.4 0.7 0.4  PROT 5.6* 5.7* 5.8*  ALBUMIN 2.2* 2.1* 2.2*   No results for input(s): LIPASE, AMYLASE in the last 168 hours. No results for input(s): AMMONIA in the last 168 hours. CBC:  Recent Labs Lab 05/31/15 2306 06/01/15 0447 06/02/15 0436 06/03/15 0449  WBC 15.1* 9.9 10.1 11.7*  NEUTROABS 12.7* 7.4 8.3* 8.9*  HGB 12.0* 10.8* 11.2* 11.1*  HCT 36.2* 32.7* 33.6* 33.8*  MCV 82.1 82.6 80.8 81.3  PLT 515* 443* 485* 521*   Cardiac Enzymes: No results for input(s): CKTOTAL, CKMB, CKMBINDEX, TROPONINI in the last 168 hours. BNP: BNP (last 3 results) No results for input(s): BNP in the last 8760 hours.  ProBNP (last 3 results) No results for input(s): PROBNP in the last 8760 hours.  CBG:  Recent Labs Lab 06/02/15 1203 06/02/15 1742 06/03/15 0043 06/03/15 0819 06/03/15 1221  GLUCAP 203* 204* 247* 168* 219*       Signed:  Kofi Murrell MD.  [facility] 06/03/2015, 6:24 PM

## 2015-06-06 LAB — CULTURE, BLOOD (ROUTINE X 2)
CULTURE: NO GROWTH
Culture: NO GROWTH

## 2015-06-07 ENCOUNTER — Telehealth: Payer: Self-pay | Admitting: Medical Oncology

## 2015-06-07 NOTE — Telephone Encounter (Signed)
tagrisso application processing

## 2015-06-10 ENCOUNTER — Encounter: Payer: Self-pay | Admitting: Internal Medicine

## 2015-06-10 ENCOUNTER — Ambulatory Visit (HOSPITAL_BASED_OUTPATIENT_CLINIC_OR_DEPARTMENT_OTHER): Payer: Self-pay

## 2015-06-10 ENCOUNTER — Telehealth: Payer: Self-pay | Admitting: Internal Medicine

## 2015-06-10 ENCOUNTER — Ambulatory Visit (HOSPITAL_BASED_OUTPATIENT_CLINIC_OR_DEPARTMENT_OTHER): Payer: Self-pay | Admitting: Internal Medicine

## 2015-06-10 VITALS — BP 125/64 | HR 117 | Temp 98.7°F | Resp 17 | Ht 67.0 in | Wt 152.9 lb

## 2015-06-10 DIAGNOSIS — C3412 Malignant neoplasm of upper lobe, left bronchus or lung: Secondary | ICD-10-CM

## 2015-06-10 DIAGNOSIS — C7951 Secondary malignant neoplasm of bone: Secondary | ICD-10-CM

## 2015-06-10 DIAGNOSIS — J90 Pleural effusion, not elsewhere classified: Secondary | ICD-10-CM

## 2015-06-10 DIAGNOSIS — Z5111 Encounter for antineoplastic chemotherapy: Secondary | ICD-10-CM

## 2015-06-10 DIAGNOSIS — C7801 Secondary malignant neoplasm of right lung: Secondary | ICD-10-CM

## 2015-06-10 DIAGNOSIS — C7802 Secondary malignant neoplasm of left lung: Secondary | ICD-10-CM

## 2015-06-10 DIAGNOSIS — R05 Cough: Secondary | ICD-10-CM

## 2015-06-10 DIAGNOSIS — R634 Abnormal weight loss: Secondary | ICD-10-CM

## 2015-06-10 DIAGNOSIS — E46 Unspecified protein-calorie malnutrition: Secondary | ICD-10-CM

## 2015-06-10 DIAGNOSIS — R0781 Pleurodynia: Secondary | ICD-10-CM

## 2015-06-10 DIAGNOSIS — G893 Neoplasm related pain (acute) (chronic): Secondary | ICD-10-CM

## 2015-06-10 MED ORDER — DEXAMETHASONE 4 MG PO TABS: ORAL_TABLET | ORAL | Status: DC

## 2015-06-10 MED ORDER — FOLIC ACID 1 MG PO TABS: 1.0000 mg | ORAL_TABLET | Freq: Every day | ORAL | Status: DC

## 2015-06-10 MED ORDER — CYANOCOBALAMIN 1000 MCG/ML IJ SOLN
1000.0000 ug | Freq: Once | INTRAMUSCULAR | Status: AC
  Administered 2015-06-10: 1000 ug via INTRAMUSCULAR

## 2015-06-10 MED FILL — FOLIC ACID 1 MG TABLET: 1 | 30 days supply | Qty: 30 | Fill #0

## 2015-06-10 MED FILL — DEXAMETHASONE 4 MG TABLET: 4 | 139 days supply | Qty: 40 | Fill #0

## 2015-06-10 NOTE — Progress Notes (Signed)
Spoke w/ pt regarding financial assistance.  Pt informed me that he is working w/ a rep from the hospital to get Medicaid and a discount thru the hospital, he's being taken care of.

## 2015-06-10 NOTE — Progress Notes (Signed)
[place] Telephone:([phone]) [phone]   [phone]) [phone]  OFFICE PROGRESS NOTE  Pcp Not In System No address on file  DIAGNOSIS: Stage IV (T1b, N3, M1b) non-small cell lung cancer, adenocarcinoma with positive EGFR mutation in exon 21 ([id number]) presented with left upper lobe lung mass in addition to bilateral pulmonary nodules and mediastinal as well as supraclavicular lymphadenopathy and metastatic bone lesions diagnosed in September 2016.  The patient had evidence for disease progression in April 2017 and blood test for EGFR mutation showed positive T790M resistant mutation.  PRIOR THERAPY:  1) status post subxiphoid pericardial window for cardiac tamponade as well as a right chest tube placement for drainage of right pleural effusion under the care of Dr. Roxy Manns. 2) Palliative radiotherapy to the left lung/left lateral rib as well as a right hip under the care of Dr. Lisbeth Renshaw completed on 01/18/2015. 3) Tarceva 150 mg by mouth daily. Status post 6 months of treatment, discontinued secondary to disease progression.   CURRENT THERAPY: The patient is expected to start systemic chemotherapy with carboplatin for AUC of 5 and Alimta 500 MG/M2 on 06/17/2015 if we cannot get patient assistance for treatment with Tagrisso.  INTERVAL HISTORY: Steven Roberts 50 y.o. male returns to the clinic today for follow-up visit accompanied by his wife. He was recently admitted to [facility] with significant right-sided chest pain as well as shortness of breath. Chest x-ray showed progressive multifocal patchy opacity in the right lung suspicious for lymphangitic spread of tumor. The patient was noted to have evidence for disease progression on recent imaging studies. Blood test for EGFR mutation showed development of positive T790M resistant mutation. We'll try to start the patient on treatment with Tagrisso but there are many issues concerning assistant program to provide the patient with  premedication from [place]. It looks like the patient does not have legally status in the [place] and he was unable to provide [employer] with documents of his status. He continues to complain of increasing fatigue and weakness. He continues to have dry cough with pain at the right lower rib cage. He lost a lot of weight recently. He has shortness breath with exertion. He denied having any hemoptysis. He denied having any significant nausea or vomiting. He has no fever or chills. He is here today for evaluation and discussion of his treatment options.  MEDICAL HISTORY: Past Medical History  Diagnosis Date  . Bone cancer ([place]) 11/03/14 Pet scan    left 6th rib, right hip  . Lung cancer ([place]) 10/23/14    LUL adenocarcinoma, non-small cell  . Pericardial effusion with cardiac tamponade 12/11/2014  . Pulmonary infiltrates  c/w Adenoca/ Stage IV  10/13/2014    - symptom onset march 2016 - Baseline cxr on July 20 2014 reported to be nl  > then CT chest 10/13/2014 LUL as dz  Assoc with   LUL nodular density/ mild adenopathy - Quantiferon Gold 10/13/2014 > neg  - FOB 10/23/14 > cobblestoning beyond Lingular orifice with diffuse narrowing x 50% of LUL beyond lingula> adenoca - PET 11/03/14 1. The dominant left upper lobe mass and extensive adenopathy involving th  . Non-small cell carcinoma of lung, stage 4 ([place]) 11/12/2014  . Hypoalbuminemia due to protein-calorie malnutrition ([place]) 12/02/2014  . Hyponatremia 12/10/2014  . Hyperphosphatemia 12/02/2014  . Bone metastasis ([place]) 11/25/2014  . S/P radiation therapy 12/07/14-01/18/15    left lung/lateral rib /rt hip  . Diabetes mellitus without complication ([place])   .  Encounter for antineoplastic chemotherapy 03/09/2015  . Pulmonary hypertension [facility]): Per 2 d echo 06/03/2015 06/03/2015    ALLERGIES:  has No Known Allergies.  MEDICATIONS:  Current Outpatient Prescriptions  Medication Sig Dispense Refill  . albuterol (PROVENTIL) (2.5 MG/3ML) 0.083%  nebulizer solution Take 3 mLs (2.5 mg total) by nebulization every 6 (six) hours as needed for wheezing or shortness of breath. 75 mL 3  . benzonatate (TESSALON) 200 MG capsule Take 1 capsule (200 mg total) by mouth 3 (three) times daily. 30 capsule 0  . dextromethorphan (DELSYM) 30 MG/5ML liquid Take 30 mg by mouth at bedtime as needed for cough.    . feeding supplement, ENSURE ENLIVE, (ENSURE ENLIVE) LIQD Take 237 mLs by mouth 2 (two) times daily between meals. 237 mL 0  . fentaNYL (DURAGESIC - DOSED MCG/HR) 50 MCG/HR Place 1 patch (50 mcg total) onto the skin every 3 (three) days. 10 patch 0  . Fluticasone-Salmeterol (ADVAIR) 250-50 MCG/DOSE AEPB Inhale 1 puff into the lungs 2 (two) times daily. 60 each 0  . glimepiride (AMARYL) 2 MG tablet Take 1 tablet (2 mg total) by mouth daily with breakfast. 30 tablet 3  . HYDROcodone-homatropine (HYCODAN) 5-1.5 MG/5ML syrup Take 5 mLs by mouth every 6 (six) hours as needed for cough.   0  . ibuprofen (ADVIL) 200 MG tablet Take 400 mg by mouth every 6 (six) hours as needed for mild pain. Reported on 03/09/2015    . osimertinib mesylate (TAGRISSO) 80 MG tablet Take 1 tablet (80 mg total) by mouth daily. 30 tablet 2  . pantoprazole (PROTONIX) 40 MG tablet Take 1 tablet (40 mg total) by mouth daily. 30 tablet 0  . tiotropium (SPIRIVA HANDIHALER) 18 MCG inhalation capsule Place 1 capsule (18 mcg total) into inhaler and inhale daily. 30 capsule 12   No current facility-administered medications for this visit.    SURGICAL HISTORY:  Past Surgical History  Procedure Laterality Date  . Video bronchoscopy Bilateral 10/23/2014    Procedure: VIDEO BRONCHOSCOPY WITHOUT FLUORO;  Surgeon: Tanda Rockers, MD;  Location: [facility];  Service: Cardiopulmonary;  Laterality: Bilateral;  . Subxyphoid pericardial window N/A 12/11/2014    Procedure: SUBXYPHOID PERICARDIAL WINDOW;  Surgeon: Rexene Alberts, MD;  Location: [facility];  Service: Thoracic;  Laterality: N/A;  . Tee  without cardioversion N/A 12/11/2014    Procedure: TRANSESOPHAGEAL ECHOCARDIOGRAM (TEE);  Surgeon: Rexene Alberts, MD;  Location: [facility];  Service: Thoracic;  Laterality: N/A;    REVIEW OF SYSTEMS:  Constitutional: positive for fatigue and weight loss Eyes: negative Ears, nose, mouth, throat, and face: negative Respiratory: positive for cough, dyspnea on exertion and pleurisy/chest pain Cardiovascular: negative Gastrointestinal: negative Genitourinary:negative Integument/breast: negative Hematologic/lymphatic: negative Musculoskeletal:negative Neurological: negative Behavioral/Psych: negative Endocrine: negative Allergic/Immunologic: negative   PHYSICAL EXAMINATION: General appearance: alert, cooperative, fatigued and no distress Head: Normocephalic, without obvious abnormality, atraumatic Neck: no adenopathy, no JVD, supple, symmetrical, trachea midline and thyroid not enlarged, symmetric, no tenderness/mass/nodules Lymph nodes: Cervical, supraclavicular, and axillary nodes normal. Resp: clear to auscultation bilaterally Back: symmetric, no curvature. ROM normal. No CVA tenderness. Cardio: regular rate and rhythm, S1, S2 normal, no murmur, click, rub or gallop GI: soft, non-tender; bowel sounds normal; no masses,  no organomegaly Extremities: extremities normal, atraumatic, no cyanosis or edema Neurologic: Alert and oriented X 3, normal strength and tone. Normal symmetric reflexes. Normal coordination and gait  ECOG PERFORMANCE STATUS: 1 - Symptomatic but completely ambulatory  Blood pressure 125/64, pulse 117, temperature 98.7 F (  37.1 C), temperature source Oral, resp. rate 17, height _0  (1.702 m), weight 152 lb 14.4 oz (69.355 kg), SpO2 94 %.  LABORATORY DATA: Lab Results  Component Value Date   WBC 11.7* 06/03/2015   HGB 11.1* 06/03/2015   HCT 33.8* 06/03/2015   MCV 81.3 06/03/2015   PLT 521* 06/03/2015      Chemistry      Component Value Date/Time   NA 132*  06/03/2015 0449   NA 133* 05/20/2015 1230   K 3.6 06/03/2015 0449   K 3.9 05/20/2015 1230   CL 93* 06/03/2015 0449   CO2 30 06/03/2015 0449   CO2 27 05/20/2015 1230   BUN 6 06/03/2015 0449   BUN 15.7 05/20/2015 1230   CREATININE 0.47* 06/03/2015 0449   CREATININE 0.8 05/20/2015 1230      Component Value Date/Time   CALCIUM 8.4* 06/03/2015 0449   CALCIUM 9.2 05/20/2015 1230   ALKPHOS 168* 06/03/2015 0449   ALKPHOS 130 05/20/2015 1230   AST 13* 06/03/2015 0449   AST 18 05/20/2015 1230   ALT 23 06/03/2015 0449   ALT 46 05/20/2015 1230   BILITOT 0.4 06/03/2015 0449   BILITOT 0.57 05/20/2015 1230       RADIOGRAPHIC STUDIES: Dg Chest 2 View  05/31/2015  CLINICAL DATA:  Shortness of breath, right lower posterior rib pain, vomiting, chronic dry cough, hypoxia. EXAM: CHEST  2 VIEW COMPARISON:  Chest radiograph dated 05/03/2015. CT chest dated 05/08/2015. FINDINGS: Radiation changes in the left upper lobe and left lower lobe. Progressive patchy opacities in the right upper and lower lobes, significantly increased from the prior chest radiograph. While lymphangitic spread of tumor is possible when correlating with the prior CT, this appearance is also worrisome for multifocal pneumonia. Small right pleural effusion.  No pneumothorax. The heart is normal size. Visualized osseous structures are within normal limits. IMPRESSION: Radiation changes in the left upper and left lower lobes. Progressive multifocal patchy opacities in the right lung, significantly increased. While lymphangitic spread of tumor is possible when correlating with prior CT, superimposed multifocal pneumonia is not excluded. Small right pleural effusion. Electronically Signed   By: Julian Hy M.D.   On: 05/31/2015 23:49    ASSESSMENT AND PLAN: This is a very pleasant 50 years old Hispanic male recently diagnosed with metastatic non-small cell lung cancer, adenocarcinoma with positive EGFR mutation in exon 21. He is  currently undergoing treatment with oral Tarceva 150 mg by mouth daily status post 5 months and tolerating his treatment well except for skin rash mainly on the face and few episodes of diarrhea. His recent imaging studies showed evidence for disease progression with questionable lymphangitic spread of tumor. Molecular studies showed positive EGFR T790M resistant mutation. We are still in the process of trying to get Tagrisso approved for this patient but there are a lot of obstacles. I discussed with the patient and his wife proceeding with systemic chemotherapy was carboplatin and Alimta for now until we have the approval for Tagrisso. I recommended for him regimen consisting of carboplatin for AUC of 5 and Alimta 500 MG/M2 every 3 weeks. I discussed with the patient adverse effects of this treatment including but not limited to alopecia, myelosuppression, nausea and vomiting, peripheral neuropathy, liver or renal dysfunction. We will arrange for the patient to receive vitamin B 12 injection today. The patient would also receive prescription for Decadron 4 mg by mouth twice a day, the day before, day of and day after the chemotherapy in  addition to folic acid 1 mg by mouth daily. I will arrange for the patient to have a chemotherapy education class before starting the first dose of the chemotherapy. He is expected to start the first cycle of this treatment on 06/17/2015. For the dry cough, he will continue on Hycodan 5 ML by mouth every 6 hours.  For pain management, the patient will continue on on fentanyl patch 25 g/hour every 3 days and he was given a refill of Vicodin. For the lack of appetite and malnutrition, he is followed by the dietitian at the [facility]. I will see the patient back for follow-up visit in 4 weeks with the start of cycle #2. He was advised to call immediately if he has any concerning symptoms in the interval. The patient voices understanding of current disease status  and treatment options and is in agreement with the current care plan.  All questions were answered. The patient knows to call the clinic with any problems, questions or concerns. We can certainly see the patient much sooner if necessary.  Disclaimer: This note was dictated with voice recognition software. Similar sounding words can inadvertently be transcribed and may not be corrected upon review.

## 2015-06-10 NOTE — Telephone Encounter (Signed)
Gave pt appt & avs °

## 2015-06-11 ENCOUNTER — Encounter: Payer: Self-pay | Admitting: Pharmacist

## 2015-06-11 NOTE — Progress Notes (Signed)
06/11/15: Mr. Steven Roberts has finally been approved for free drug assistance for Tagrisso through [place] patient assistance program. He will receive his first shipment of Tagrisso tomorrow, Saturday 4/29. Patient has been educated on Tagrisso and he and his wife are aware of potential side effects including rash, diarrhea, and GI toxicity. He can take [place]on with or without food (one tablet once daily by mouth)  Thank you,  Montel Clock, PharmD, [facility]

## 2015-06-14 ENCOUNTER — Telehealth: Payer: Self-pay | Admitting: Internal Medicine

## 2015-06-14 ENCOUNTER — Other Ambulatory Visit: Payer: Self-pay | Admitting: *Deleted

## 2015-06-14 NOTE — Telephone Encounter (Signed)
cx all pt tx per pof.. Pt approved for oral chemo

## 2015-06-15 ENCOUNTER — Other Ambulatory Visit: Payer: Self-pay

## 2015-06-16 ENCOUNTER — Telehealth: Payer: Self-pay | Admitting: Medical Oncology

## 2015-06-16 ENCOUNTER — Other Ambulatory Visit: Payer: Self-pay | Admitting: Internal Medicine

## 2015-06-16 NOTE — Telephone Encounter (Signed)
Fax confirmation that oral chemo shipped to pt.

## 2015-06-17 ENCOUNTER — Other Ambulatory Visit: Payer: Self-pay

## 2015-06-17 ENCOUNTER — Ambulatory Visit: Payer: Self-pay

## 2015-06-24 ENCOUNTER — Other Ambulatory Visit (HOSPITAL_BASED_OUTPATIENT_CLINIC_OR_DEPARTMENT_OTHER): Payer: Self-pay

## 2015-06-24 DIAGNOSIS — C3412 Malignant neoplasm of upper lobe, left bronchus or lung: Secondary | ICD-10-CM

## 2015-06-24 LAB — COMPREHENSIVE METABOLIC PANEL
ALBUMIN: 2.4 g/dL — AB (ref 3.5–5.0)
ALK PHOS: 112 U/L (ref 40–150)
ALT: 12 U/L (ref 0–55)
AST: 11 U/L (ref 5–34)
Anion Gap: 9 mEq/L (ref 3–11)
BUN: 10 mg/dL (ref 7.0–26.0)
CO2: 28 mEq/L (ref 22–29)
CREATININE: 0.8 mg/dL (ref 0.7–1.3)
Calcium: 9.6 mg/dL (ref 8.4–10.4)
Chloride: 99 mEq/L (ref 98–109)
EGFR: >90 mL/min/{1.73_m2} (ref >90)
GLUCOSE: 194 mg/dL — AB (ref 70–140)
Potassium: 4.5 mEq/L (ref 3.5–5.1)
SODIUM: 136 meq/L (ref 136–145)
TOTAL PROTEIN: 6.9 g/dL (ref 6.4–8.3)
Total Bilirubin: 0.38 mg/dL (ref 0.20–1.20)

## 2015-06-24 LAB — CBC WITH DIFFERENTIAL/PLATELET
BASO%: 0.4 % (ref 0.0–2.0)
Basophils Absolute: 0 10*3/uL (ref 0.0–0.1)
EOS ABS: 0.1 10*3/uL (ref 0.0–0.5)
EOS%: 1.4 % (ref 0.0–7.0)
HEMATOCRIT: 35.7 % — AB (ref 38.4–49.9)
HEMOGLOBIN: 11.4 g/dL — AB (ref 13.0–17.1)
LYMPH#: 0.5 10*3/uL — AB (ref 0.9–3.3)
LYMPH%: 5.4 % — ABNORMAL LOW (ref 14.0–49.0)
MCH: 25.9 pg — AB (ref 27.2–33.4)
MCHC: 32 g/dL (ref 32.0–36.0)
MCV: 81 fL (ref 79.3–98.0)
MONO#: 0.8 10*3/uL (ref 0.1–0.9)
MONO%: 8.5 % (ref 0.0–14.0)
NEUT%: 84.3 % — ABNORMAL HIGH (ref 39.0–75.0)
NEUTROS ABS: 8.2 10*3/uL — AB (ref 1.5–6.5)
Platelets: 361 10*3/uL (ref 140–400)
RBC: 4.41 10*6/uL (ref 4.20–5.82)
RDW: 14.9 % — ABNORMAL HIGH (ref 11.0–14.6)
WBC: 9.8 10*3/uL (ref 4.0–10.3)

## 2015-06-25 ENCOUNTER — Other Ambulatory Visit: Payer: Self-pay | Admitting: *Deleted

## 2015-06-25 ENCOUNTER — Encounter: Payer: Self-pay | Admitting: Pharmacist

## 2015-06-25 MED ORDER — HYDROCODONE-ACETAMINOPHEN 5-325 MG PO TABS: 1.0000 | ORAL_TABLET | Freq: Four times a day (QID) | ORAL | Status: DC | PRN

## 2015-06-25 MED ORDER — HYDROCODONE-HOMATROPINE 5-1.5 MG/5ML PO SYRP: 5.0000 mL | ORAL_SOLUTION | Freq: Four times a day (QID) | ORAL | Status: DC | PRN

## 2015-06-25 MED FILL — HYDROCODON-APAP 5-325: 5-325 | 5 days supply | Qty: 45 | Fill #0

## 2015-06-25 MED FILL — HYDROCODONE-HOMATROPINE SYR: 5-1.5 | 6 days supply | Qty: 120 | Fill #0

## 2015-06-25 NOTE — Progress Notes (Signed)
Oral Chemotherapy Follow-Up Form  Original Start date of oral chemotherapy: 06/12/15   Called patient today to follow up regarding patient's oral chemotherapy medication: [employer] with patient's wife, Linton Ham, over the phone today. Mr. Tirado is doing pretty well but is still having fatigue and pain. Refills were placed for his pain medication as well as cough syrup as he is still complaining of cough. No issues with nausea. No missed doses.  Pt reports 0 tablets/doses missed in the last week/month.    Pt reports the following side effects: fatigue  Other Issues: pain from malignancy, cough   Will follow up and call patient again in 3 weeks   Thank you,  Montel Clock, PharmD, [facility]

## 2015-06-25 NOTE — Progress Notes (Signed)
Pt requested refill on Hydrocodone and Hycodan through pharmacy- per Selena Lesser, FNP ok to refill. Pt will be advised ready for pick up per Montel Clock, pharm

## 2015-07-01 ENCOUNTER — Ambulatory Visit (HOSPITAL_BASED_OUTPATIENT_CLINIC_OR_DEPARTMENT_OTHER): Payer: Self-pay | Admitting: Nurse Practitioner

## 2015-07-01 ENCOUNTER — Other Ambulatory Visit: Payer: Self-pay | Admitting: Nurse Practitioner

## 2015-07-01 ENCOUNTER — Ambulatory Visit (HOSPITAL_COMMUNITY)
Admission: RE | Admit: 2015-07-01 | Discharge: 2015-07-01 | Disposition: A | Discharge status: Home / Self Care | Payer: Self-pay | Source: Ambulatory Visit | Attending: Nurse Practitioner | Admitting: Nurse Practitioner

## 2015-07-01 ENCOUNTER — Ambulatory Visit (HOSPITAL_COMMUNITY)
Admission: RE | Admit: 2015-07-01 | Discharge: 2015-07-01 | Disposition: A | Discharge status: Home / Self Care | Payer: Self-pay | Source: Ambulatory Visit | Attending: General Surgery | Admitting: General Surgery

## 2015-07-01 ENCOUNTER — Other Ambulatory Visit (HOSPITAL_BASED_OUTPATIENT_CLINIC_OR_DEPARTMENT_OTHER): Payer: Self-pay

## 2015-07-01 VITALS — BP 113/69 | HR 101 | Temp 97.7°F | Resp 18 | Ht 67.0 in | Wt 160.8 lb

## 2015-07-01 DIAGNOSIS — G893 Neoplasm related pain (acute) (chronic): Secondary | ICD-10-CM

## 2015-07-01 DIAGNOSIS — E46 Unspecified protein-calorie malnutrition: Secondary | ICD-10-CM

## 2015-07-01 DIAGNOSIS — C3412 Malignant neoplasm of upper lobe, left bronchus or lung: Secondary | ICD-10-CM | POA: Insufficient documentation

## 2015-07-01 DIAGNOSIS — E8809 Other disorders of plasma-protein metabolism, not elsewhere classified: Secondary | ICD-10-CM

## 2015-07-01 DIAGNOSIS — R05 Cough: Secondary | ICD-10-CM

## 2015-07-01 DIAGNOSIS — C7801 Secondary malignant neoplasm of right lung: Secondary | ICD-10-CM | POA: Insufficient documentation

## 2015-07-01 DIAGNOSIS — C7951 Secondary malignant neoplasm of bone: Secondary | ICD-10-CM

## 2015-07-01 DIAGNOSIS — Z9889 Other specified postprocedural states: Secondary | ICD-10-CM

## 2015-07-01 DIAGNOSIS — J90 Pleural effusion, not elsewhere classified: Secondary | ICD-10-CM

## 2015-07-01 DIAGNOSIS — R0602 Shortness of breath: Secondary | ICD-10-CM

## 2015-07-01 DIAGNOSIS — E860 Dehydration: Secondary | ICD-10-CM

## 2015-07-01 DIAGNOSIS — J948 Other specified pleural conditions: Secondary | ICD-10-CM | POA: Insufficient documentation

## 2015-07-01 LAB — COMPREHENSIVE METABOLIC PANEL
ALK PHOS: 113 U/L (ref 40–150)
ALT: <9 U/L (ref 0–55)
AST: 14 U/L (ref 5–34)
Albumin: 2.3 g/dL — ABNORMAL LOW (ref 3.5–5.0)
Anion Gap: 6 mEq/L (ref 3–11)
BUN: 10.3 mg/dL (ref 7.0–26.0)
CO2: 32 meq/L — AB (ref 22–29)
Calcium: 9.5 mg/dL (ref 8.4–10.4)
Chloride: 97 mEq/L — ABNORMAL LOW (ref 98–109)
Creatinine: 0.8 mg/dL (ref 0.7–1.3)
GLUCOSE: 184 mg/dL — AB (ref 70–140)
POTASSIUM: 4.7 meq/L (ref 3.5–5.1)
SODIUM: 135 meq/L — AB (ref 136–145)
Total Bilirubin: 0.34 mg/dL (ref 0.20–1.20)
Total Protein: 7.2 g/dL (ref 6.4–8.3)

## 2015-07-01 LAB — CBC WITH DIFFERENTIAL/PLATELET
BASO%: 0.7 % (ref 0.0–2.0)
BASOS ABS: 0.1 10*3/uL (ref 0.0–0.1)
EOS ABS: 0.4 10*3/uL (ref 0.0–0.5)
EOS%: 5.1 % (ref 0.0–7.0)
HCT: 34.2 % — ABNORMAL LOW (ref 38.4–49.9)
HGB: 11 g/dL — ABNORMAL LOW (ref 13.0–17.1)
LYMPH%: 5.9 % — AB (ref 14.0–49.0)
MCH: 25.9 pg — AB (ref 27.2–33.4)
MCHC: 32.1 g/dL (ref 32.0–36.0)
MCV: 80.7 fL (ref 79.3–98.0)
MONO#: 0.7 10*3/uL (ref 0.1–0.9)
MONO%: 9.4 % (ref 0.0–14.0)
NEUT#: 6.2 10*3/uL (ref 1.5–6.5)
NEUT%: 78.9 % — ABNORMAL HIGH (ref 39.0–75.0)
Platelets: 395 10*3/uL (ref 140–400)
RBC: 4.24 10*6/uL (ref 4.20–5.82)
RDW: 15.1 % — ABNORMAL HIGH (ref 11.0–14.6)
WBC: 7.8 10*3/uL (ref 4.0–10.3)
lymph#: 0.5 10*3/uL — ABNORMAL LOW (ref 0.9–3.3)

## 2015-07-01 MED ORDER — MORPHINE SULFATE ER 15 MG PO TBCR: 15.0000 mg | EXTENDED_RELEASE_TABLET | Freq: Two times a day (BID) | ORAL | Status: DC

## 2015-07-01 NOTE — Procedures (Signed)
Ultrasound-guided diagnostic and therapeutic right thoracentesis performed yielding 1.25liters of clear yellow colored fluid. No immediate complications. Follow-up chest x-ray pending.       Devante Capano E 12:49 PM 07/01/2015

## 2015-07-02 ENCOUNTER — Encounter: Payer: Self-pay | Admitting: Nurse Practitioner

## 2015-07-02 DIAGNOSIS — G893 Neoplasm related pain (acute) (chronic): Secondary | ICD-10-CM | POA: Insufficient documentation

## 2015-07-02 NOTE — Assessment & Plan Note (Signed)
Patient continues to take the Tagrisso oral therapy as directed.  Labs obtained today were all within normal limits.  See further notes for details.  Patient is scheduled to return on 07/08/2015 for labs and follow up visit.

## 2015-07-02 NOTE — Assessment & Plan Note (Signed)
Patient appears mildly dehydrated today with sodium 135.  Patient was encouraged to push fluids is much as possible.  Of note-patient was not interested in staying at the [facility] to receive any IV fluid rehydration today.

## 2015-07-02 NOTE — Progress Notes (Signed)
SYMPTOM MANAGEMENT CLINIC    Chief Complaint: Shortness of breath  HPI:  Steven Roberts 50 y.o. male diagnosed with lung cancer with bone metastasis.  Currently undergoing Tagrisso oral therapy.  Patient had been receiving Taxotere/Cytoxan chemotherapy; and developed a significant generalized rash following her last cycle of chemotherapy.  She was then switched to gemcitabine/Cytoxan for cycle 3-which she received yesterday on 06/30/2015.  Patient states in the middle of the night.  She awoke with a much worse generalized rash that was pruritic.  She denies allergic-type symptoms whatsoever.  Patient states that she took Benadryl 25 mg earlier this morning.  Exam today reveals a bright red rash that is diffuse to her entire body.  She is managing all secretions well and her airways intact.  Vital signs are stable and patient is afebrile.  Patient will once again be given a Solu-Medrol 80 mg intramuscular injection; and was advised to continue taking the Benadryl and Pepcid as directed as well.  She has also started taking the dexamethasone 8 mg 3 times per day for the next few days as well.  Patient was advised to call/return of her directly to the emergency department for any worsening symptoms whatsoever.  Will review all findings with Dr. Jana Hakim; prior to patient receiving any further chemotherapy.    No history exists.    Review of Systems  Constitutional: Positive for malaise/fatigue.  Respiratory: Positive for cough and shortness of breath.   All other systems reviewed and are negative.   Past Medical History  Diagnosis Date  . Bone cancer ([place]) 11/03/14 Pet scan    left 6th rib, right hip  . Lung cancer ([place]) 10/23/14    LUL adenocarcinoma, non-small cell  . Pericardial effusion with cardiac tamponade 12/11/2014  . Pulmonary infiltrates  c/w Adenoca/ Stage IV  10/13/2014    - symptom onset march 2016 - Baseline cxr on July 20 2014 reported to be nl  > then CT chest  10/13/2014 LUL as dz  Assoc with   LUL nodular density/ mild adenopathy - Quantiferon Gold 10/13/2014 > neg  - FOB 10/23/14 > cobblestoning beyond Lingular orifice with diffuse narrowing x 50% of LUL beyond lingula> adenoca - PET 11/03/14 1. The dominant left upper lobe mass and extensive adenopathy involving th  . Non-small cell carcinoma of lung, stage 4 ([place]) 11/12/2014  . Hypoalbuminemia due to protein-calorie malnutrition ([place]) 12/02/2014  . Hyponatremia 12/10/2014  . Hyperphosphatemia 12/02/2014  . Bone metastasis ([place]) 11/25/2014  . S/P radiation therapy 12/07/14-01/18/15    left lung/lateral rib /rt hip  . Diabetes mellitus without complication ([place])   . Encounter for antineoplastic chemotherapy 03/09/2015  . Pulmonary hypertension [facility]): Per 2 d echo 06/03/2015 06/03/2015    Past Surgical History  Procedure Laterality Date  . Video bronchoscopy Bilateral 10/23/2014    Procedure: VIDEO BRONCHOSCOPY WITHOUT FLUORO;  Surgeon: Tanda Rockers, MD;  Location: [facility];  Service: Cardiopulmonary;  Laterality: Bilateral;  . Subxyphoid pericardial window N/A 12/11/2014    Procedure: SUBXYPHOID PERICARDIAL WINDOW;  Surgeon: Rexene Alberts, MD;  Location: [place];  Service: Thoracic;  Laterality: N/A;  . Tee without cardioversion N/A 12/11/2014    Procedure: TRANSESOPHAGEAL ECHOCARDIOGRAM (TEE);  Surgeon: Rexene Alberts, MD;  Location: [place];  Service: Thoracic;  Laterality: N/A;    has Pulmonary infiltrates  c/w Adenoca/ Stage IV ; Primary cancer of left upper lobe of lung ([place]); Bone metastasis ([place]); Hyperglycemia; Hypoalbuminemia due to protein-calorie malnutrition ([place]); Pericardial effusion with  cardiac tamponade; Diabetes mellitus type 2 in nonobese [facility]); Encounter for antineoplastic chemotherapy; CAP (community acquired pneumonia); Pleural effusion on right; Pleuritic chest pain; Dehydration; HCAP (healthcare-associated pneumonia); SOB (shortness of breath); Pulmonary hypertension ([place]): Per 2  d echo 06/03/2015; Tachycardia; and Cancer associated pain on his problem list.    has No Known Allergies.    Medication List       This list is accurate as of: 07/01/15 11:59 PM.  Always use your most recent med list.               ADVIL 200 MG tablet  Generic drug:  ibuprofen  Take 400 mg by mouth every 6 (six) hours as needed for mild pain. Reported on 03/09/2015     albuterol (2.5 MG/3ML) 0.083% nebulizer solution  Commonly known as:  PROVENTIL  Take 3 mLs (2.5 mg total) by nebulization every 6 (six) hours as needed for wheezing or shortness of breath.     benzonatate 200 MG capsule  Commonly known as:  TESSALON  Take 1 capsule (200 mg total) by mouth 3 (three) times daily.     DELSYM 30 MG/5ML liquid  Generic drug:  dextromethorphan  Take 30 mg by mouth at bedtime as needed for cough. Reported on 06/10/2015     dexamethasone 4 MG tablet  Commonly known as:  DECADRON  4 mg by mouth twice a day the day before, day of and day after the chemotherapy every 3 weeks     feeding supplement (ENSURE ENLIVE) Liqd  Take 237 mLs by mouth 2 (two) times daily between meals.     fentaNYL 50 MCG/HR  Commonly known as:  DURAGESIC - dosed mcg/hr  Place 1 patch (50 mcg total) onto the skin every 3 (three) days.     Fluticasone-Salmeterol 250-50 MCG/DOSE Aepb  Commonly known as:  ADVAIR  Inhale 1 puff into the lungs 2 (two) times daily.     folic acid 1 MG tablet  Commonly known as:  FOLVITE  Take 1 tablet (1 mg total) by mouth daily.     glimepiride 2 MG tablet  Commonly known as:  AMARYL  Take 1 tablet (2 mg total) by mouth daily with breakfast.     HYDROcodone-acetaminophen 5-325 MG tablet  Commonly known as:  NORCO/VICODIN  Take 1-2 tablets by mouth every 6 (six) hours as needed for moderate pain.     HYDROcodone-homatropine 5-1.5 MG/5ML syrup  Commonly known as:  HYCODAN  Take 5 mLs by mouth every 6 (six) hours as needed for cough.     morphine 15 MG 12 hr tablet    Commonly known as:  MS CONTIN  Take 1 tablet (15 mg total) by mouth every 12 (twelve) hours.     osimertinib mesylate 80 MG tablet  Commonly known as:  TAGRISSO  Take 1 tablet (80 mg total) by mouth daily.     pantoprazole 40 MG tablet  Commonly known as:  PROTONIX  Take 1 tablet (40 mg total) by mouth daily.     tiotropium 18 MCG inhalation capsule  Commonly known as:  SPIRIVA HANDIHALER  Place 1 capsule (18 mcg total) into inhaler and inhale daily.         PHYSICAL EXAMINATION  Oncology Vitals 07/01/2015 06/10/2015  Height 170 cm 170 cm  Weight 72.938 kg 69.355 kg  Weight (lbs) 160 lbs 13 oz 152 lbs 14 oz  BMI (kg/m2) 25.18 kg/m2 23.95 kg/m2  Temp 97.7 98.7  Pulse 101 117  Resp 18 17  SpO2 100 94  BSA (m2) 1.86 m2 1.81 m2   BP Readings from Last 2 Encounters:  07/01/15 111/60  07/01/15 113/69    Physical Exam  Constitutional: He is oriented to person, place, and time. Vital signs are normal. He appears malnourished and dehydrated. He appears cachectic.  HENT:  Head: Normocephalic and atraumatic.  Mouth/Throat: Oropharynx is clear and moist.  Eyes: Conjunctivae and EOM are normal. Pupils are equal, round, and reactive to light. Right eye exhibits no discharge. Left eye exhibits no discharge. No scleral icterus.  Neck: Normal range of motion. Neck supple. No JVD present. No tracheal deviation present. No thyromegaly present.  Cardiovascular: Normal rate, regular rhythm, normal heart sounds and intact distal pulses.   Pulmonary/Chest: Effort normal and breath sounds normal. No respiratory distress. He has no wheezes. He has no rales. He exhibits no tenderness.  Patient does have a chronic dry cough; but doesn't appear acutely short of breath on exam.  Patient continues with O2 via nasal cannula at 2 L.  Abdominal: Soft. Bowel sounds are normal. He exhibits no distension and no mass. There is no tenderness. There is no rebound and no guarding.  Musculoskeletal: Normal  range of motion. He exhibits no edema or tenderness.  Lymphadenopathy:    He has no cervical adenopathy.  Neurological: He is alert and oriented to person, place, and time. Gait normal.  Skin: Skin is warm and dry. No rash noted. No erythema. There is pallor.  Psychiatric: Affect normal.  Nursing note reviewed.   LABORATORY DATA:. Appointment on 07/01/2015  Component Date Value Ref Range Status  . WBC 07/01/2015 7.8  4.0 - 10.3 10e3/uL Final  . NEUT# 07/01/2015 6.2  1.5 - 6.5 10e3/uL Final  . HGB 07/01/2015 11.0* 13.0 - 17.1 g/dL Final  . HCT 07/01/2015 34.2* 38.4 - 49.9 % Final  . Platelets 07/01/2015 395  140 - 400 10e3/uL Final  . MCV 07/01/2015 80.7  79.3 - 98.0 fL Final  . MCH 07/01/2015 25.9* 27.2 - 33.4 pg Final  . MCHC 07/01/2015 32.1  32.0 - 36.0 g/dL Final  . RBC 07/01/2015 4.24  4.20 - 5.82 10e6/uL Final  . RDW 07/01/2015 15.1* 11.0 - 14.6 % Final  . lymph# 07/01/2015 0.5* 0.9 - 3.3 10e3/uL Final  . MONO# 07/01/2015 0.7  0.1 - 0.9 10e3/uL Final  . Eosinophils Absolute 07/01/2015 0.4  0.0 - 0.5 10e3/uL Final  . Basophils Absolute 07/01/2015 0.1  0.0 - 0.1 10e3/uL Final  . NEUT% 07/01/2015 78.9* 39.0 - 75.0 % Final  . LYMPH% 07/01/2015 5.9* 14.0 - 49.0 % Final  . MONO% 07/01/2015 9.4  0.0 - 14.0 % Final  . EOS% 07/01/2015 5.1  0.0 - 7.0 % Final  . BASO% 07/01/2015 0.7  0.0 - 2.0 % Final  . Sodium 07/01/2015 135* 136 - 145 mEq/L Final  . Potassium 07/01/2015 4.7  3.5 - 5.1 mEq/L Final  . Chloride 07/01/2015 97* 98 - 109 mEq/L Final  . CO2 07/01/2015 32* 22 - 29 mEq/L Final  . Glucose 07/01/2015 184* 70 - 140 mg/dl Final   Glucose reference range is for nonfasting patients. Fasting glucose reference range is 70- 100.  Marland Kitchen BUN 07/01/2015 10.3  7.0 - 26.0 mg/dL Final  . Creatinine 07/01/2015 0.8  0.7 - 1.3 mg/dL Final  . Total Bilirubin 07/01/2015 0.34  0.20 - 1.20 mg/dL Final  . Alkaline Phosphatase 07/01/2015 113  40 - 150 U/L Final  . AST 07/01/2015  14  5 - 34 U/L Final   . ALT 07/01/2015 <9  0 - 55 U/L Final  . Total Protein 07/01/2015 7.2  6.4 - 8.3 g/dL Final  . Albumin 07/01/2015 2.3* 3.5 - 5.0 g/dL Final  . Calcium 07/01/2015 9.5  8.4 - 10.4 mg/dL Final  . Anion Gap 07/01/2015 6  3 - 11 mEq/L Final  . EGFR 07/01/2015 >90  >90 ml/min/1.73 m2 Final   eGFR is calculated using the CKD-EPI Creatinine Equation (2009)    RADIOGRAPHIC STUDIES: Dg Chest 1 View  07/01/2015  CLINICAL DATA:  Post right-sided thoracentesis, cough, history of lung carcinoma EXAM: CHEST 1 VIEW COMPARISON:  Chest x-ray of 07/01/2015, and CT chest of 05/08/2015 FINDINGS: After right thoracentesis much of the right pleural effusion has been evacuated. No pneumothorax is seen. A left hilar opacity remains and very prominent interstitial markings are noted throughout the lungs possibly due to lymphangitic spread of carcinoma. Mediastinal and hilar contours are unchanged. The heart is within normal limits in size. IMPRESSION: Decrease in volume of right pleural effusion after right thoracentesis with no pneumothorax. Electronically Signed   By: Ivar Drape M.D.   On: 07/01/2015 13:08   Dg Chest 2 View  07/01/2015  CLINICAL DATA:  50 year old male history of left upper lobe carcinoma with radiation. EXAM: CHEST  2 VIEW COMPARISON:  05/11/2015, 05/31/2015, chest CT 05/08/2015 FINDINGS: Cardiomediastinal silhouette relatively unchanged though partially obscured by overlying lung and pleural disease. Over the course of recent chest x-rays there has been worsening of right-sided pleural effusion, now with pleural parenchymal opacity obscuring the right hemidiaphragm, and dense opacity at the right base. Opacity of the right mid and lower lobe mixed interstitial and airspace opacity with thickening of the interlobular septa. Worsening opacity in the left central lung, with increasing interstitial and interlobular opacity of the lower lung. No pneumothorax. No large left-sided pleural effusion. 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 Worsening of right-sided pleural effusion, now moderate sized. Signed, Dulcy Fanny. Earleen Newport, DO [facility] Electronically Signed   By: Corrie Mckusick D.O.   On: 07/01/2015 10:03   US Thoracentesis Asp Pleural Space W/img Guide  07/01/2015  INDICATION: 50 year old male with history of lung cancer who has a recurrent right pleural effusion. Request has been made for diagnostic and therapeutic thoracentesis. EXAM: ULTRASOUND GUIDED DIAGNOSTIC AND THERAPEUTIC THORACENTESIS MEDICATIONS: 1% lidocaine COMPLICATIONS: None immediate. PROCEDURE: An ultrasound guided thoracentesis was thoroughly discussed with the patient and questions answered. The benefits, risks, alternatives and complications were also discussed. The patient understands and wishes to proceed with the procedure. Written consent was obtained. Ultrasound was performed to localize and mark an adequate pocket of fluid in the right chest. The area was then prepped and draped in the normal sterile fashion. 1% Lidocaine was used for local anesthesia. A Safe-T-Centesis catheter was introduced. Thoracentesis was performed. The catheter was removed and a dressing applied. FINDINGS: A total of approximately 1.25 L of clear yellow fluid was removed. Samples were sent to the laboratory as requested by the clinical team. IMPRESSION: Successful ultrasound guided right thoracentesis yielding 1.25 L of pleural fluid. Read by: Saverio Danker, PA-C Electronically Signed   By: Sandi Mariscal M.D.   On: 07/01/2015 12:52    ASSESSMENT/PLAN:    Primary cancer of left upper lobe of lung [facility]) Patient continues to  take the Tagrisso oral therapy as directed.  Labs obtained today were all within normal  limits.  See further notes for details.  Patient is scheduled to return on 07/08/2015 for labs and follow up visit.    Pleural effusion on right Patient has history of chronic right pleural effusion.  He complains of increased shortness of breath and cough for the past several days.  Chest x-ray obtained today revealed increased right pleural effusion.  Was able to obtain a stat right thoracentesis for increased pleural effusion.  Interventional radiology report indicates that they were able to remove approximately 1.25 L of pleural fluid from the right lung.    Dehydration Patient appears mildly dehydrated today with sodium 135.  Patient was encouraged to push fluids is much as possible.  Of note-patient was not interested in staying at the [facility] to receive any IV fluid rehydration today.  Cancer associated pain Patient continues to experience significant chronic right sided chest wall and back pain secondary to his cancer diagnosis.  He states that he has discontinued taking the fentanyl patch; since it makes him too sleepy.  He has been taking the hydrocodone and the Hycodan cough syrup only.  Reviewed all findings with Dr. Julien Nordmann; he was in agreement that patient should try the MS Contin 15 mg twice daily.  Patient should also continue to take the hydrocodone or the Hycodan cough syrup on an as-needed basis for breakthrough pain.   Patient stated understanding of all instructions; and was in agreement with this plan of care. The patient knows to call the clinic with any problems, questions or concerns.   Total time spent with patient was 25 minutes;  with greater than 75 percent of that time spent in face to face counseling regarding patient's symptoms,  and coordination of care and follow up.  Disclaimer:This dictation was prepared with Dragon/digital dictation along with [employer]. Any transcriptional errors that result from this process are  unintentional.  Drue Second, NP 07/02/2015

## 2015-07-02 NOTE — Assessment & Plan Note (Signed)
Patient has history of chronic right pleural effusion.  He complains of increased shortness of breath and cough for the past several days.  Chest x-ray obtained today revealed increased right pleural effusion.  Was able to obtain a stat right thoracentesis for increased pleural effusion.  Interventional radiology report indicates that they were able to remove approximately 1.25 L of pleural fluid from the right lung.

## 2015-07-02 NOTE — Assessment & Plan Note (Signed)
Patient continues to experience significant chronic right sided chest wall and back pain secondary to his cancer diagnosis.  He states that he has discontinued taking the fentanyl patch; since it makes him too sleepy.  He has been taking the hydrocodone and the Hycodan cough syrup only.  Reviewed all findings with Dr. Julien Nordmann; he was in agreement that patient should try the MS Contin 15 mg twice daily.  Patient should also continue to take the hydrocodone or the Hycodan cough syrup on an as-needed basis for breakthrough pain.

## 2015-07-05 ENCOUNTER — Telehealth: Payer: Self-pay | Admitting: *Deleted

## 2015-07-05 NOTE — Telephone Encounter (Signed)
Voicemail: "He's in a lot of pain and needs to see Selena Lesser." Returned call asking breathing assessment questions.  "He hurts where the thoracentesis was done on Thursday.  When he coughs he needs more air and he coughs all day and night.  He took (MS Contin) morphine last night at 9:00 pm  He took the other one, Hydrocodone 5-325 mg this morning.  He has now taken the morphine a few minutes ago.  The cough medicine is not working."  This nurse advised he take the 12-hr MS-Contin EVERY 12 hours, on time  To be more effective.  The breakthrough order reads he can take up to two pills and to increase the hydrocodone to two pills every six hours.  The cough medicine also is every six hours.  Will notify providers and call with any new orders or additions to current orders.  Return number [phone].

## 2015-07-05 NOTE — Telephone Encounter (Signed)
Verbal order received and read back from Selena Lesser NP for patient to follow current orders for pain.  To call if no change following these guidelines.  Called wife with this information and check on him.  Earlier, we spoke he rated pain as ten on pain scale.  At this time, rates pain as four out of ten on pain scale after receiving two hydrodone at 2:00.

## 2015-07-08 ENCOUNTER — Telehealth: Payer: Self-pay | Admitting: Internal Medicine

## 2015-07-08 ENCOUNTER — Other Ambulatory Visit (HOSPITAL_BASED_OUTPATIENT_CLINIC_OR_DEPARTMENT_OTHER): Payer: Self-pay

## 2015-07-08 ENCOUNTER — Ambulatory Visit: Payer: Self-pay

## 2015-07-08 ENCOUNTER — Telehealth: Payer: Self-pay

## 2015-07-08 ENCOUNTER — Ambulatory Visit (HOSPITAL_BASED_OUTPATIENT_CLINIC_OR_DEPARTMENT_OTHER): Payer: Self-pay | Admitting: Nurse Practitioner

## 2015-07-08 ENCOUNTER — Other Ambulatory Visit: Payer: Self-pay

## 2015-07-08 VITALS — BP 124/83 | HR 103 | Temp 98.0°F | Resp 16 | Ht 67.0 in | Wt 151.9 lb

## 2015-07-08 DIAGNOSIS — J910 Malignant pleural effusion: Secondary | ICD-10-CM

## 2015-07-08 DIAGNOSIS — C3412 Malignant neoplasm of upper lobe, left bronchus or lung: Secondary | ICD-10-CM

## 2015-07-08 DIAGNOSIS — C3491 Malignant neoplasm of unspecified part of right bronchus or lung: Secondary | ICD-10-CM

## 2015-07-08 DIAGNOSIS — C7951 Secondary malignant neoplasm of bone: Secondary | ICD-10-CM

## 2015-07-08 DIAGNOSIS — R609 Edema, unspecified: Secondary | ICD-10-CM

## 2015-07-08 LAB — COMPREHENSIVE METABOLIC PANEL
ALBUMIN: 2.2 g/dL — AB (ref 3.5–5.0)
ALK PHOS: 118 U/L (ref 40–150)
ALT: <9 U/L (ref 0–55)
ANION GAP: 7 meq/L (ref 3–11)
AST: 8 U/L (ref 5–34)
BUN: 10.2 mg/dL (ref 7.0–26.0)
CALCIUM: 9.4 mg/dL (ref 8.4–10.4)
CO2: 30 mEq/L — ABNORMAL HIGH (ref 22–29)
Chloride: 96 mEq/L — ABNORMAL LOW (ref 98–109)
Creatinine: 0.7 mg/dL (ref 0.7–1.3)
Glucose: 190 mg/dl — ABNORMAL HIGH (ref 70–140)
POTASSIUM: 4.2 meq/L (ref 3.5–5.1)
Sodium: 134 mEq/L — ABNORMAL LOW (ref 136–145)
Total Bilirubin: 0.3 mg/dL (ref 0.20–1.20)
Total Protein: 6.9 g/dL (ref 6.4–8.3)

## 2015-07-08 LAB — CBC WITH DIFFERENTIAL/PLATELET
BASO%: 0.7 % (ref 0.0–2.0)
Basophils Absolute: 0.1 10*3/uL (ref 0.0–0.1)
EOS%: 3.7 % (ref 0.0–7.0)
Eosinophils Absolute: 0.3 10*3/uL (ref 0.0–0.5)
HEMATOCRIT: 33 % — AB (ref 38.4–49.9)
HGB: 10.4 g/dL — ABNORMAL LOW (ref 13.0–17.1)
LYMPH#: 0.4 10*3/uL — AB (ref 0.9–3.3)
LYMPH%: 4.4 % — ABNORMAL LOW (ref 14.0–49.0)
MCH: 25.3 pg — ABNORMAL LOW (ref 27.2–33.4)
MCHC: 31.6 g/dL — AB (ref 32.0–36.0)
MCV: 80 fL (ref 79.3–98.0)
MONO#: 0.9 10*3/uL (ref 0.1–0.9)
MONO%: 11.3 % (ref 0.0–14.0)
NEUT#: 6.6 10*3/uL — ABNORMAL HIGH (ref 1.5–6.5)
NEUT%: 79.9 % — AB (ref 39.0–75.0)
Platelets: 421 10*3/uL — ABNORMAL HIGH (ref 140–400)
RBC: 4.12 10*6/uL — ABNORMAL LOW (ref 4.20–5.82)
RDW: 14.8 % — ABNORMAL HIGH (ref 11.0–14.6)
WBC: 8.2 10*3/uL (ref 4.0–10.3)

## 2015-07-08 MED ORDER — HYDROCODONE-ACETAMINOPHEN 5-325 MG PO TABS: 1.0000 | ORAL_TABLET | Freq: Four times a day (QID) | ORAL | Status: DC | PRN

## 2015-07-08 MED FILL — HYDROCODON-APAP 5-325: 5-325 | 7 days supply | Qty: 60 | Fill #0

## 2015-07-08 NOTE — Telephone Encounter (Signed)
Gave and printed appt sched and avs for pt for June °

## 2015-07-08 NOTE — Telephone Encounter (Signed)
Called and spoke with [facility] in nutrition. Lisa requesting barb contact patients wife to go over some more nutrition information. Raford Pitcher states she will certainly try and give them a call today but if not then as soon as she can, they are also on her follow up scheduled. Informed patients wife.

## 2015-07-08 NOTE — Progress Notes (Addendum)
Steven Roberts OFFICE PROGRESS NOTE   DIAGNOSIS: Stage IV (T1b, N3, M1b) non-small cell lung cancer, adenocarcinoma with positive EGFR mutation in exon 21 ([id number]) presented with left upper lobe lung mass in addition to bilateral pulmonary nodules and mediastinal as well as supraclavicular lymphadenopathy and metastatic bone lesions diagnosed in September 2016.  The patient had evidence for disease progression in April 2017 and blood test for EGFR mutation showed positive T790M resistant mutation.  PRIOR THERAPY:  1) status post subxiphoid pericardial window for cardiac tamponade as well as a right chest tube placement for drainage of right pleural effusion under the care of Dr. Roxy Manns. 2) Palliative radiotherapy to the left lung/left lateral rib as well as a right hip under the care of Dr. Lisbeth Renshaw completed on 01/18/2015. 3) Tarceva 150 mg by mouth daily. Status post 6 months of treatment, discontinued secondary to disease progression.   CURRENT THERAPY: The patient is expected to start systemic chemotherapy with carboplatin for AUC of 5 and Alimta 500 MG/M2 on 06/17/2015 if we cannot get patient assistance for treatment with Tagrisso. Tagrisso initiated on 06/11/2015.  INTERVAL HISTORY:   Steven Roberts returns as scheduled. [name] on 06/11/2015. He denies nausea/vomiting. No mouth sores. No diarrhea. He recently noted a rash on his arms, hands and legs. The rash lasted for about 2 days and then resolved. The rash has not recurred. He mainly notes shortness of breath with coughing. He continues supplemental oxygen at 2 L/m. He continues to have right-sided chest pain. He is taking MS Contin with Vicodin as needed. He noted slight improvement in his breathing following the thoracentesis but no change in pain. Appetite is poor. Feet have been swollen this week.  Objective:  Vital signs in last 24 hours:  Blood pressure 124/83, pulse 103, temperature 98 F (36.7 C), temperature  source Oral, resp. rate 16, height 5' 7"  (1.702 m), weight 151 lb 14.4 oz (68.901 kg), SpO2 92 %.    HEENT: No thrush or ulcers. Lymphatics: One to 2 cm right anterior cervical lymph node; one or two 1 cm firm left cervical lymph nodes. Resp: Breath sounds diminished right lung field. Good air movement on the left. No respiratory distress. Cardio: Regular rate and rhythm. GI: Abdomen soft and nontender. No hepatomegaly. Vascular: Pitting lower leg edema bilaterally. Skin: No rash.    Lab Results:  Lab Results  Component Value Date   WBC 8.2 07/08/2015   HGB 10.4* 07/08/2015   HCT 33.0* 07/08/2015   MCV 80.0 07/08/2015   PLT 421* 07/08/2015   NEUTROABS 6.6* 07/08/2015    Imaging:  No results found.  Medications: I have reviewed the patient's current medications.  Assessment/Plan: 1. 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 2. 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 3. Pain secondary to #1. He continues MS Contin with hydrocodone as needed. Hydrocodone prescription given at today's visit. 4. Bilateral lower extremity edema. Most likely due to hypoalbuminemia. We discussed leg elevation and trying to maximize nutrition. I will ask the dietitian to contact his wife. 5. Recent rash on extremities. Resolved.   Disposition: Steven Roberts began Steven Roberts 06/11/2015. Thus far he appears to be tolerating it without significant toxicity. Plan to continue the same. He will return for a follow-up visit in 2 weeks. Restaging CT evaluation planned at  an 8 week interval from the start of [place]. He will contact the office in the interim with any problems.  25 minutes were spent face-to-face at today's visit with the majority of that time  involved in counseling/coordination of care.  Plan reviewed with Dr. Julien Nordmann.    Ned Card ANP/GNP-BC   07/08/2015  11:17 AM

## 2015-07-13 ENCOUNTER — Telehealth: Payer: Self-pay | Admitting: Internal Medicine

## 2015-07-13 NOTE — Telephone Encounter (Signed)
S.w. Pt and advised on June nut appt....the patient ok and aware of d.t

## 2015-07-15 ENCOUNTER — Other Ambulatory Visit: Payer: Self-pay | Admitting: Medical Oncology

## 2015-07-15 ENCOUNTER — Other Ambulatory Visit (HOSPITAL_BASED_OUTPATIENT_CLINIC_OR_DEPARTMENT_OTHER): Payer: Self-pay

## 2015-07-15 DIAGNOSIS — C7951 Secondary malignant neoplasm of bone: Secondary | ICD-10-CM

## 2015-07-15 DIAGNOSIS — C3412 Malignant neoplasm of upper lobe, left bronchus or lung: Secondary | ICD-10-CM

## 2015-07-15 LAB — CBC WITH DIFFERENTIAL/PLATELET
BASO%: 0.6 % (ref 0.0–2.0)
Basophils Absolute: 0 10*3/uL (ref 0.0–0.1)
EOS%: 2.2 % (ref 0.0–7.0)
Eosinophils Absolute: 0.2 10*3/uL (ref 0.0–0.5)
HCT: 31 % — ABNORMAL LOW (ref 38.4–49.9)
HGB: 9.8 g/dL — ABNORMAL LOW (ref 13.0–17.1)
LYMPH%: 5.6 % — AB (ref 14.0–49.0)
MCH: 25 pg — ABNORMAL LOW (ref 27.2–33.4)
MCHC: 31.6 g/dL — AB (ref 32.0–36.0)
MCV: 79.2 fL — ABNORMAL LOW (ref 79.3–98.0)
MONO#: 0.8 10*3/uL (ref 0.1–0.9)
MONO%: 10.5 % (ref 0.0–14.0)
NEUT#: 6 10*3/uL (ref 1.5–6.5)
NEUT%: 81.1 % — AB (ref 39.0–75.0)
PLATELETS: 497 10*3/uL — AB (ref 140–400)
RBC: 3.91 10*6/uL — AB (ref 4.20–5.82)
RDW: 15.4 % — ABNORMAL HIGH (ref 11.0–14.6)
WBC: 7.3 10*3/uL (ref 4.0–10.3)
lymph#: 0.4 10*3/uL — ABNORMAL LOW (ref 0.9–3.3)

## 2015-07-15 LAB — COMPREHENSIVE METABOLIC PANEL
ALK PHOS: 123 U/L (ref 40–150)
ANION GAP: 7 meq/L (ref 3–11)
AST: 10 U/L (ref 5–34)
Albumin: 2.2 g/dL — ABNORMAL LOW (ref 3.5–5.0)
BUN: 8.8 mg/dL (ref 7.0–26.0)
CHLORIDE: 97 meq/L — AB (ref 98–109)
CO2: 32 mEq/L — ABNORMAL HIGH (ref 22–29)
Calcium: 9.3 mg/dL (ref 8.4–10.4)
Creatinine: 0.7 mg/dL (ref 0.7–1.3)
Glucose: 173 mg/dl — ABNORMAL HIGH (ref 70–140)
Potassium: 4.3 mEq/L (ref 3.5–5.1)
Sodium: 136 mEq/L (ref 136–145)
Total Bilirubin: 0.32 mg/dL (ref 0.20–1.20)
Total Protein: 7.1 g/dL (ref 6.4–8.3)

## 2015-07-15 MED ORDER — HYDROCODONE-ACETAMINOPHEN 5-325 MG PO TABS: 1.0000 | ORAL_TABLET | Freq: Four times a day (QID) | ORAL | Status: DC | PRN

## 2015-07-15 MED FILL — HYDROCODON-APAP 5-325: 5-325 | 7 days supply | Qty: 60 | Fill #0

## 2015-07-15 NOTE — Progress Notes (Signed)
Wife notified to pick up rx. 

## 2015-07-22 ENCOUNTER — Telehealth: Payer: Self-pay | Admitting: Internal Medicine

## 2015-07-22 ENCOUNTER — Other Ambulatory Visit (HOSPITAL_BASED_OUTPATIENT_CLINIC_OR_DEPARTMENT_OTHER): Payer: Self-pay

## 2015-07-22 ENCOUNTER — Ambulatory Visit (HOSPITAL_BASED_OUTPATIENT_CLINIC_OR_DEPARTMENT_OTHER): Payer: Self-pay | Admitting: Nurse Practitioner

## 2015-07-22 VITALS — BP 133/77 | HR 109 | Temp 97.8°F | Resp 17 | Wt 148.6 lb

## 2015-07-22 DIAGNOSIS — C3412 Malignant neoplasm of upper lobe, left bronchus or lung: Secondary | ICD-10-CM

## 2015-07-22 DIAGNOSIS — J910 Malignant pleural effusion: Secondary | ICD-10-CM

## 2015-07-22 DIAGNOSIS — C7951 Secondary malignant neoplasm of bone: Secondary | ICD-10-CM

## 2015-07-22 DIAGNOSIS — G893 Neoplasm related pain (acute) (chronic): Secondary | ICD-10-CM

## 2015-07-22 DIAGNOSIS — R609 Edema, unspecified: Secondary | ICD-10-CM

## 2015-07-22 LAB — COMPREHENSIVE METABOLIC PANEL
ALBUMIN: 2.3 g/dL — AB (ref 3.5–5.0)
ALK PHOS: 112 U/L (ref 40–150)
ANION GAP: 6 meq/L (ref 3–11)
AST: 11 U/L (ref 5–34)
BUN: 11.2 mg/dL (ref 7.0–26.0)
CALCIUM: 9.3 mg/dL (ref 8.4–10.4)
CO2: 33 mEq/L — ABNORMAL HIGH (ref 22–29)
CREATININE: 0.7 mg/dL (ref 0.7–1.3)
Chloride: 94 mEq/L — ABNORMAL LOW (ref 98–109)
EGFR: >90 mL/min/{1.73_m2} (ref >90)
Glucose: 168 mg/dl — ABNORMAL HIGH (ref 70–140)
Potassium: 4.4 mEq/L (ref 3.5–5.1)
Sodium: 134 mEq/L — ABNORMAL LOW (ref 136–145)
Total Bilirubin: 0.38 mg/dL (ref 0.20–1.20)
Total Protein: 7.5 g/dL (ref 6.4–8.3)

## 2015-07-22 LAB — CBC WITH DIFFERENTIAL/PLATELET
BASO%: 0.6 % (ref 0.0–2.0)
BASOS ABS: 0 10*3/uL (ref 0.0–0.1)
EOS%: 2 % (ref 0.0–7.0)
Eosinophils Absolute: 0.2 10*3/uL (ref 0.0–0.5)
HEMATOCRIT: 32 % — AB (ref 38.4–49.9)
HEMOGLOBIN: 10 g/dL — AB (ref 13.0–17.1)
LYMPH#: 0.4 10*3/uL — AB (ref 0.9–3.3)
LYMPH%: 4.7 % — ABNORMAL LOW (ref 14.0–49.0)
MCH: 24.7 pg — AB (ref 27.2–33.4)
MCHC: 31.2 g/dL — ABNORMAL LOW (ref 32.0–36.0)
MCV: 79.2 fL — ABNORMAL LOW (ref 79.3–98.0)
MONO#: 0.9 10*3/uL (ref 0.1–0.9)
MONO%: 11.2 % (ref 0.0–14.0)
NEUT#: 6.3 10*3/uL (ref 1.5–6.5)
NEUT%: 81.5 % — ABNORMAL HIGH (ref 39.0–75.0)
PLATELETS: 469 10*3/uL — AB (ref 140–400)
RBC: 4.04 10*6/uL — ABNORMAL LOW (ref 4.20–5.82)
RDW: 14.9 % — AB (ref 11.0–14.6)
WBC: 7.7 10*3/uL (ref 4.0–10.3)

## 2015-07-22 MED ORDER — HYDROCODONE-ACETAMINOPHEN 5-325 MG PO TABS: 1.0000 | ORAL_TABLET | Freq: Four times a day (QID) | ORAL | Status: DC | PRN

## 2015-07-22 NOTE — Telephone Encounter (Signed)
spoke w/ pt confirmed 6/15 apts

## 2015-07-22 NOTE — Progress Notes (Addendum)
[place] OFFICE PROGRESS NOTE   DIAGNOSIS: Stage IV (T1b, N3, M1b) non-small cell lung cancer, adenocarcinoma with positive EGFR mutation in exon 21 ([id number]) presented with left upper lobe lung mass in addition to bilateral pulmonary nodules and mediastinal as well as supraclavicular lymphadenopathy and metastatic bone lesions diagnosed in September 2016.  The patient had evidence for disease progression in April 2017 and blood test for EGFR mutation showed positive T790M resistant mutation.  PRIOR THERAPY:  1) status post subxiphoid pericardial window for cardiac tamponade as well as a right chest tube placement for drainage of right pleural effusion under the care of Dr. Roxy Manns. 2) Palliative radiotherapy to the left lung/left lateral rib as well as a right hip under the care of Dr. Lisbeth Renshaw completed on 01/18/2015. 3) Tarceva 150 mg by mouth daily. Status post 6 months of treatment, discontinued secondary to disease progression.   CURRENT THERAPY: Tagrisso initiated on 06/11/2015.  INTERVAL HISTORY:   Mr. Reim returns as scheduled. He continues Tagrisso. He denies nausea/vomiting. No mouth sores. No diarrhea. No rash. He has a persistent cough and shortness of breath. No fever. Energy level is very poor. Appetite is poor. He continues to have bilateral leg edema. He has persistent pain involving the right chest. He discontinued MS Contin due to sedation. He continues Vicodin every 6 hours.  Objective:  Vital signs in last 24 hours:  Blood pressure 133/77, pulse 109, temperature 97.8 F (36.6 C), temperature source Oral, resp. rate 17, weight 148 lb 9.6 oz (67.405 kg), SpO2 92 %. Repeat heart rate 100.    HEENT: No thrush or ulcers. Lymphatics: 3-4 cm right anterior cervical lymph node. 1 cm left supra clavicular lymph node. Resp: Breath sounds diminished right lung field. Good air movement on the left. No respiratory distress. Cardio: Regular rate and rhythm. GI: No  hepatomegaly. Vascular: Pitting edema below the knees bilaterally. Skin: No rash.    Lab Results:  Lab Results  Component Value Date   WBC 7.7 07/22/2015   HGB 10.0* 07/22/2015   HCT 32.0* 07/22/2015   MCV 79.2* 07/22/2015   PLT 469* 07/22/2015   NEUTROABS 6.3 07/22/2015    Imaging:  No results found.  Medications: I have reviewed the patient's current medications.  Assessment/Plan: 1. 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 2. 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 3. Pain secondary to #1. Hydrocodone prescription given at today's visit. 4. Bilateral lower extremity edema. Most likely due to hypoalbuminemia. We discussed leg elevation and trying to maximize nutrition. 5. Recent rash on extremities. Resolved.   Disposition: Mr. Schertzer performance status continues to decline. There has been no improvement in his pain or breathing. He has now been on Tagrisso approximately 6 weeks. We are referring him for restaging CT scans. He will return for a follow-up visit in one week to review the results.  For pain Dr. Julien Nordmann recommends resuming a Duragesic patch and continuing hydrocodone as needed. He understands to contact the office with continued poor pain control.  Patient seen with Dr. Julien Nordmann.    Ned Card ANP/GNP-BC   07/22/2015  10:24 AM  ADDENDUM: Hematology/Oncology Attending: I had a face to face encounter with the patient. I recommended his care plan. This is a very pleasant 50 years old Hispanic male with a stage IV non-small  cell lung cancer with positive EGFR mutation in exon 21 status post treatment with Tarceva discontinued secondary to disease progression and the patient is currently on treatment with  Tagrisso for T790M resistant mutation started 6 weeks ago. The patient continues to have significant fatigue and weakness as well as weight loss. He also continues to have pain on the right side of the chest secondary to his disease and pleural effusion. He is currently on hydrocodone when necessary. I recommended for the patient to start Duragesic patch 25 g/hour every 3 days in addition to hydrocodone for breakthrough pain. He was also noted to have enlarging right neck and supraclavicular lymphadenopathy. I recommended for the patient to have repeat CT scan of the head, neck, chest, abdomen and pelvis for restaging of his disease. If he continues to have disease progression, I would consider the patient for ultrasound guided biopsy of one of the enlarging right neck lymphadenopathy to rule out transformation to a small cell or any other new mutation. He would come back for follow-up visit in one week for evaluation and discussion of his scan results and recommendation regarding his condition. The patient was advised to call immediately if he has any concerning symptoms in the interval.  Disclaimer: This note was dictated with voice recognition software. Similar sounding words can inadvertently be transcribed and may be missed upon review. Eilleen Kempf., MD 07/24/2015

## 2015-07-26 ENCOUNTER — Encounter: Payer: Self-pay | Admitting: Nutrition

## 2015-07-26 NOTE — Progress Notes (Signed)
Patient did not show up for nutrition appointment. 

## 2015-07-27 ENCOUNTER — Encounter (HOSPITAL_COMMUNITY): Payer: Self-pay | Admitting: Emergency Medicine

## 2015-07-27 ENCOUNTER — Ambulatory Visit (HOSPITAL_COMMUNITY)
Admission: RE | Admit: 2015-07-27 | Discharge: 2015-07-27 | Disposition: A | Discharge status: Home / Self Care | Payer: Medicaid Other | Source: Ambulatory Visit | Attending: Nurse Practitioner | Admitting: Nurse Practitioner

## 2015-07-27 ENCOUNTER — Encounter (HOSPITAL_COMMUNITY): Payer: Self-pay

## 2015-07-27 ENCOUNTER — Emergency Department (HOSPITAL_COMMUNITY): Payer: Medicaid Other

## 2015-07-27 ENCOUNTER — Inpatient Hospital Stay (HOSPITAL_COMMUNITY): Payer: Medicaid Other

## 2015-07-27 ENCOUNTER — Inpatient Hospital Stay (HOSPITAL_COMMUNITY)
Admission: EM | Admit: 2015-07-27 | Discharge: 2015-07-30 | DRG: 180 | Disposition: A | Discharge status: Hospice | Payer: Medicaid Other | Attending: Internal Medicine | Admitting: Internal Medicine

## 2015-07-27 ENCOUNTER — Other Ambulatory Visit: Payer: Self-pay

## 2015-07-27 DIAGNOSIS — E119 Type 2 diabetes mellitus without complications: Secondary | ICD-10-CM | POA: Diagnosis present

## 2015-07-27 DIAGNOSIS — J910 Malignant pleural effusion: Secondary | ICD-10-CM | POA: Diagnosis present

## 2015-07-27 DIAGNOSIS — C3412 Malignant neoplasm of upper lobe, left bronchus or lung: Secondary | ICD-10-CM

## 2015-07-27 DIAGNOSIS — G893 Neoplasm related pain (acute) (chronic): Secondary | ICD-10-CM

## 2015-07-27 DIAGNOSIS — K769 Liver disease, unspecified: Secondary | ICD-10-CM | POA: Insufficient documentation

## 2015-07-27 DIAGNOSIS — M7989 Other specified soft tissue disorders: Secondary | ICD-10-CM

## 2015-07-27 DIAGNOSIS — R93422 Abnormal radiologic findings on diagnostic imaging of left kidney: Secondary | ICD-10-CM | POA: Insufficient documentation

## 2015-07-27 DIAGNOSIS — R5383 Other fatigue: Secondary | ICD-10-CM

## 2015-07-27 DIAGNOSIS — R0600 Dyspnea, unspecified: Secondary | ICD-10-CM | POA: Insufficient documentation

## 2015-07-27 DIAGNOSIS — I313 Pericardial effusion (noninflammatory): Secondary | ICD-10-CM | POA: Diagnosis present

## 2015-07-27 DIAGNOSIS — Z7984 Long term (current) use of oral hypoglycemic drugs: Secondary | ICD-10-CM

## 2015-07-27 DIAGNOSIS — R590 Localized enlarged lymph nodes: Secondary | ICD-10-CM | POA: Insufficient documentation

## 2015-07-27 DIAGNOSIS — J440 Chronic obstructive pulmonary disease with acute lower respiratory infection: Secondary | ICD-10-CM | POA: Diagnosis present

## 2015-07-27 DIAGNOSIS — J189 Pneumonia, unspecified organism: Secondary | ICD-10-CM | POA: Diagnosis present

## 2015-07-27 DIAGNOSIS — J441 Chronic obstructive pulmonary disease with (acute) exacerbation: Secondary | ICD-10-CM | POA: Diagnosis present

## 2015-07-27 DIAGNOSIS — C7800 Secondary malignant neoplasm of unspecified lung: Secondary | ICD-10-CM

## 2015-07-27 DIAGNOSIS — Z79899 Other long term (current) drug therapy: Secondary | ICD-10-CM

## 2015-07-27 DIAGNOSIS — C7951 Secondary malignant neoplasm of bone: Secondary | ICD-10-CM

## 2015-07-27 DIAGNOSIS — J90 Pleural effusion, not elsewhere classified: Secondary | ICD-10-CM | POA: Insufficient documentation

## 2015-07-27 DIAGNOSIS — C3490 Malignant neoplasm of unspecified part of unspecified bronchus or lung: Secondary | ICD-10-CM

## 2015-07-27 DIAGNOSIS — I319 Disease of pericardium, unspecified: Secondary | ICD-10-CM

## 2015-07-27 DIAGNOSIS — Z7189 Other specified counseling: Secondary | ICD-10-CM | POA: Insufficient documentation

## 2015-07-27 DIAGNOSIS — J9602 Acute respiratory failure with hypercapnia: Secondary | ICD-10-CM | POA: Diagnosis present

## 2015-07-27 DIAGNOSIS — J9600 Acute respiratory failure, unspecified whether with hypoxia or hypercapnia: Secondary | ICD-10-CM

## 2015-07-27 DIAGNOSIS — Z66 Do not resuscitate: Secondary | ICD-10-CM | POA: Diagnosis present

## 2015-07-27 DIAGNOSIS — R93421 Abnormal radiologic findings on diagnostic imaging of right kidney: Secondary | ICD-10-CM

## 2015-07-27 DIAGNOSIS — I272 Other secondary pulmonary hypertension: Secondary | ICD-10-CM | POA: Diagnosis present

## 2015-07-27 DIAGNOSIS — Z515 Encounter for palliative care: Secondary | ICD-10-CM | POA: Diagnosis present

## 2015-07-27 DIAGNOSIS — J9601 Acute respiratory failure with hypoxia: Secondary | ICD-10-CM | POA: Diagnosis present

## 2015-07-27 DIAGNOSIS — Y95 Nosocomial condition: Secondary | ICD-10-CM | POA: Diagnosis present

## 2015-07-27 LAB — ECHOCARDIOGRAM COMPLETE
CHL CUP MV DEC (S): 214
E decel time: 214 msec
E/e' ratio: 9.56
FS: 29 % (ref 28–44)
Height: 67 in
IV/PV OW: 0.9
LA ID, A-P, ES: 26 mm
LA diam end sys: 26 mm
LA vol: 53 mL
LADIAMINDEX: 1.49 cm/m2
LAVOLA4C: 38.6 mL
LAVOLIN: 30.3 mL/m2
LV TDI E'LATERAL: 9.79
LVEEAVG: 9.56
LVEEMED: 9.56
LVELAT: 9.79 cm/s
LVOT area: 5.31 cm2
LVOT diameter: 26 mm
MV Peak grad: 4 mmHg
MV pk A vel: 72.8 m/s
MVPKEVEL: 93.6 m/s
PW: 7.91 mm — AB (ref 0.6–1.1)
RV TAPSE: 12.9 mm
Reg peak vel: 252 cm/s
TDI e' medial: 11
TRMAXVEL: 252 cm/s
Weight: 2278.67 oz

## 2015-07-27 LAB — BODY FLUID CELL COUNT WITH DIFFERENTIAL
LYMPHS FL: 59 %
MONOCYTE-MACROPHAGE-SEROUS FLUID: 21 % — AB (ref 50–90)
Neutrophil Count, Fluid: 20 % (ref 0–25)
Total Nucleated Cell Count, Fluid: 602 cu mm (ref 0–1000)

## 2015-07-27 LAB — COMPREHENSIVE METABOLIC PANEL
ALK PHOS: 134 U/L — AB (ref 38–126)
ALT: 20 U/L (ref 17–63)
AST: 24 U/L (ref 15–41)
Albumin: 2.5 g/dL — ABNORMAL LOW (ref 3.5–5.0)
Anion gap: 6 (ref 5–15)
BUN: 20 mg/dL (ref 6–20)
CALCIUM: 8.9 mg/dL (ref 8.9–10.3)
CHLORIDE: 93 mmol/L — AB (ref 101–111)
CO2: 34 mmol/L — AB (ref 22–32)
Creatinine, Ser: 0.67 mg/dL (ref 0.61–1.24)
GFR calc non Af Amer: >60 mL/min (ref >60)
Glucose, Bld: 154 mg/dL — ABNORMAL HIGH (ref 65–99)
Potassium: 4.3 mmol/L (ref 3.5–5.1)
SODIUM: 133 mmol/L — AB (ref 135–145)
TOTAL PROTEIN: 7.6 g/dL (ref 6.5–8.1)
Total Bilirubin: 0.5 mg/dL (ref 0.3–1.2)

## 2015-07-27 LAB — CBC WITH DIFFERENTIAL/PLATELET
BASOS PCT: 0 %
Basophils Absolute: 0 10*3/uL (ref 0.0–0.1)
Eosinophils Absolute: 0.1 10*3/uL (ref 0.0–0.7)
Eosinophils Relative: 1 %
HEMATOCRIT: 30.8 % — AB (ref 39.0–52.0)
HEMOGLOBIN: 9.6 g/dL — AB (ref 13.0–17.0)
LYMPHS ABS: 0.7 10*3/uL (ref 0.7–4.0)
Lymphocytes Relative: 7 %
MCH: 24.9 pg — ABNORMAL LOW (ref 26.0–34.0)
MCHC: 31.2 g/dL (ref 30.0–36.0)
MCV: 79.8 fL (ref 78.0–100.0)
MONOS PCT: 6 %
Monocytes Absolute: 0.6 10*3/uL (ref 0.1–1.0)
NEUTROS ABS: 9.2 10*3/uL — AB (ref 1.7–7.7)
NEUTROS PCT: 86 %
Platelets: 496 10*3/uL — ABNORMAL HIGH (ref 150–400)
RBC: 3.86 MIL/uL — AB (ref 4.22–5.81)
RDW: 14.9 % (ref 11.5–15.5)
WBC: 10.6 10*3/uL — ABNORMAL HIGH (ref 4.0–10.5)

## 2015-07-27 LAB — TROPONIN I
TROPONIN I: 0.05 ng/mL — AB (ref <0.031)
TROPONIN I: 0.05 ng/mL — AB (ref <0.031)

## 2015-07-27 LAB — URINE MICROSCOPIC-ADD ON

## 2015-07-27 LAB — CORTISOL: CORTISOL PLASMA: 21.5 ug/dL

## 2015-07-27 LAB — BLOOD GAS, ARTERIAL
Acid-Base Excess: 8.6 mmol/L — ABNORMAL HIGH (ref 0.0–2.0)
Acid-Base Excess: 8.6 mmol/L — ABNORMAL HIGH (ref 0.0–2.0)
BICARBONATE: 35.2 meq/L — AB (ref 20.0–24.0)
Bicarbonate: 35.3 mEq/L — ABNORMAL HIGH (ref 20.0–24.0)
Drawn by: 103701
Drawn by: 295031
O2 CONTENT: 3 L/min
O2 Content: 4 L/min
O2 SAT: 91.5 %
O2 Saturation: 92.9 %
PATIENT TEMPERATURE: 98.6
PCO2 ART: 66.1 mmHg — AB (ref 35.0–45.0)
PH ART: 7.357 (ref 7.350–7.450)
PO2 ART: 75.4 mmHg — AB (ref 80.0–100.0)
PO2 ART: 68.3 mmHg — AB (ref 80.0–100.0)
Patient temperature: 98.6
TCO2: 33.2 mmol/L (ref 0–100)
TCO2: 33.4 mmol/L (ref 0–100)
pCO2 arterial: 64.3 mmHg (ref 35.0–45.0)
pH, Arterial: 7.348 — ABNORMAL LOW (ref 7.350–7.450)

## 2015-07-27 LAB — URINALYSIS, ROUTINE W REFLEX MICROSCOPIC
Bilirubin Urine: NEGATIVE
GLUCOSE, UA: NEGATIVE mg/dL
Ketones, ur: NEGATIVE mg/dL
LEUKOCYTES UA: NEGATIVE
NITRITE: NEGATIVE
PH: 5 (ref 5.0–8.0)
PROTEIN: NEGATIVE mg/dL
Specific Gravity, Urine: >1.046 — ABNORMAL HIGH (ref 1.005–1.030)

## 2015-07-27 LAB — LACTATE DEHYDROGENASE: LDH: 114 U/L (ref 98–192)

## 2015-07-27 LAB — GLUCOSE, CAPILLARY
Glucose-Capillary: 137 mg/dL — ABNORMAL HIGH (ref 65–99)
Glucose-Capillary: 150 mg/dL — ABNORMAL HIGH (ref 65–99)
Glucose-Capillary: 168 mg/dL — ABNORMAL HIGH (ref 65–99)

## 2015-07-27 LAB — I-STAT TROPONIN, ED: Troponin i, poc: 0.02 ng/mL (ref 0.00–0.08)

## 2015-07-27 LAB — PROCALCITONIN

## 2015-07-27 LAB — I-STAT CG4 LACTIC ACID, ED: LACTIC ACID, VENOUS: 0.98 mmol/L (ref 0.5–2.0)

## 2015-07-27 LAB — PROTEIN, BODY FLUID: TOTAL PROTEIN, FLUID: 5 g/dL

## 2015-07-27 LAB — BRAIN NATRIURETIC PEPTIDE
B NATRIURETIC PEPTIDE 5: 29.2 pg/mL (ref 0.0–100.0)
B Natriuretic Peptide: 27.8 pg/mL (ref 0.0–100.0)

## 2015-07-27 LAB — PROTEIN, TOTAL: TOTAL PROTEIN: 7.5 g/dL (ref 6.5–8.1)

## 2015-07-27 LAB — D-DIMER, QUANTITATIVE: D-Dimer, Quant: 5.14 ug/mL-FEU — ABNORMAL HIGH (ref 0.00–0.50)

## 2015-07-27 LAB — MRSA PCR SCREENING: MRSA BY PCR: NEGATIVE

## 2015-07-27 LAB — LACTIC ACID, PLASMA: LACTIC ACID, VENOUS: 1.2 mmol/L (ref 0.5–2.0)

## 2015-07-27 MED ORDER — HYDROMORPHONE HCL 1 MG/ML IJ SOLN
0.5000 mg | INTRAMUSCULAR | Status: DC | PRN
  Administered 2015-07-28: 0.5 mg via INTRAVENOUS
  Filled 2015-07-27: qty 1

## 2015-07-27 MED ORDER — IPRATROPIUM-ALBUTEROL 0.5-2.5 (3) MG/3ML IN SOLN
3.0000 mL | Freq: Four times a day (QID) | RESPIRATORY_TRACT | Status: DC
  Administered 2015-07-27 – 2015-07-28 (×2): 3 mL via RESPIRATORY_TRACT
  Filled 2015-07-27 (×3): qty 3

## 2015-07-27 MED ORDER — CHLORHEXIDINE GLUCONATE 0.12 % MT SOLN
15.0000 mL | Freq: Two times a day (BID) | OROMUCOSAL | Status: DC
  Administered 2015-07-27 – 2015-07-28 (×2): 15 mL via OROMUCOSAL

## 2015-07-27 MED ORDER — SODIUM CHLORIDE 0.9 % IV SOLN
INTRAVENOUS | Status: DC
  Administered 2015-07-27 – 2015-07-28 (×2): via INTRAVENOUS
  Administered 2015-07-28: 75 mL via INTRAVENOUS

## 2015-07-27 MED ORDER — SODIUM CHLORIDE 0.9 % IV SOLN
250.0000 mL | INTRAVENOUS | Status: DC | PRN
  Administered 2015-07-28: 250 mL via INTRAVENOUS

## 2015-07-27 MED ORDER — VANCOMYCIN HCL IN DEXTROSE 1-5 GM/200ML-% IV SOLN
1000.0000 mg | INTRAVENOUS | Status: AC
  Administered 2015-07-27: 1000 mg via INTRAVENOUS
  Filled 2015-07-27: qty 200

## 2015-07-27 MED ORDER — CETYLPYRIDINIUM CHLORIDE 0.05 % MT LIQD
7.0000 mL | Freq: Two times a day (BID) | OROMUCOSAL | Status: DC
  Administered 2015-07-27 – 2015-07-28 (×2): 7 mL via OROMUCOSAL

## 2015-07-27 MED ORDER — PIPERACILLIN-TAZOBACTAM 3.375 G IVPB
3.3750 g | Freq: Three times a day (TID) | INTRAVENOUS | Status: DC
  Administered 2015-07-27 – 2015-07-28 (×2): 3.375 g via INTRAVENOUS
  Filled 2015-07-27 (×2): qty 50

## 2015-07-27 MED ORDER — INSULIN ASPART 100 UNIT/ML ~~LOC~~ SOLN
0.0000 [IU] | SUBCUTANEOUS | Status: DC
  Administered 2015-07-27 (×2): 1 [IU] via SUBCUTANEOUS
  Administered 2015-07-27: 2 [IU] via SUBCUTANEOUS
  Administered 2015-07-28: 5 [IU] via SUBCUTANEOUS
  Administered 2015-07-28 (×2): 3 [IU] via SUBCUTANEOUS
  Administered 2015-07-28 (×2): 1 [IU] via SUBCUTANEOUS
  Administered 2015-07-29: 3 [IU] via SUBCUTANEOUS
  Administered 2015-07-29: 1 [IU] via SUBCUTANEOUS
  Administered 2015-07-29 – 2015-07-30 (×4): 2 [IU] via SUBCUTANEOUS
  Administered 2015-07-30: 1 [IU] via SUBCUTANEOUS
  Administered 2015-07-30: 2 [IU] via SUBCUTANEOUS

## 2015-07-27 MED ORDER — ENOXAPARIN SODIUM 40 MG/0.4ML ~~LOC~~ SOLN
40.0000 mg | SUBCUTANEOUS | Status: DC
  Administered 2015-07-27 – 2015-07-29 (×3): 40 mg via SUBCUTANEOUS
  Filled 2015-07-27 (×3): qty 0.4

## 2015-07-27 MED ORDER — IOPAMIDOL (ISOVUE-300) INJECTION 61%
100.0000 mL | Freq: Once | INTRAVENOUS | Status: AC | PRN
  Administered 2015-07-27: 100 mL via INTRAVENOUS

## 2015-07-27 MED ORDER — VITAMINS A & D EX OINT: TOPICAL_OINTMENT | CUTANEOUS | Status: DC | PRN

## 2015-07-27 MED ORDER — VITAMINS A & D EX OINT
TOPICAL_OINTMENT | CUTANEOUS | Status: AC
  Administered 2015-07-27: 20:00:00
  Filled 2015-07-27: qty 10

## 2015-07-27 MED ORDER — METHYLPREDNISOLONE SODIUM SUCC 40 MG IJ SOLR
40.0000 mg | Freq: Four times a day (QID) | INTRAMUSCULAR | Status: DC
Start: 2015-07-27 — End: 2015-07-28
  Administered 2015-07-27 – 2015-07-28 (×4): 40 mg via INTRAVENOUS
  Filled 2015-07-27 (×4): qty 1

## 2015-07-27 MED ORDER — IOPAMIDOL (ISOVUE-370) INJECTION 76%
100.0000 mL | Freq: Once | INTRAVENOUS | Status: AC | PRN
  Administered 2015-07-27: 80 mL via INTRAVENOUS

## 2015-07-27 MED ORDER — VANCOMYCIN HCL IN DEXTROSE 1-5 GM/200ML-% IV SOLN
1000.0000 mg | Freq: Three times a day (TID) | INTRAVENOUS | Status: DC
  Administered 2015-07-27 – 2015-07-28 (×2): 1000 mg via INTRAVENOUS
  Filled 2015-07-27 (×2): qty 200

## 2015-07-27 MED ORDER — PIPERACILLIN-TAZOBACTAM 3.375 G IVPB 30 MIN
3.3750 g | INTRAVENOUS | Status: AC
  Administered 2015-07-27: 3.375 g via INTRAVENOUS
  Filled 2015-07-27: qty 50

## 2015-07-27 NOTE — Progress Notes (Signed)
Male family member confirms pt has recently started to see Dr Jimmye Norman at [facility] and continues to see oncology staff  CM spoke with pt who confirms uninsured [employer] resident with no pcp.  CM discussed and provided written information to assist pt with determining choice for uninsured accepting pcps, discussed the importance of pcp vs EDP services for f/u care, www.needymeds.org, www.goodrx.com, discounted pharmacies and other [employer] such as [facility] [employer], affordable care act, financial assistance, uninsured dental services, [employer] med assist, DSS and  health department  Reviewed resources for [employer] uninsured accepting pcps like [facility], [facility] at [employer], [facility], [facility], local urgent care centers, [facility], [facility] family practice, general medical clinics, family services of the [place], [facility] urgent care plus others, medication resources, [facility] out patient [facility]ies and housing Pt voiced understanding and appreciation of resources provided  Encouraged use of medication resources

## 2015-07-27 NOTE — Progress Notes (Signed)
  Echocardiogram 2D Echocardiogram has been performed.  Jennette Dubin 07/27/2015, 4:36 PM

## 2015-07-27 NOTE — Progress Notes (Signed)
DIAGNOSIS: Stage IV (T1b, N3, M1b) non-small cell lung cancer, adenocarcinoma with positive EGFR mutation in exon 21 ([id number]) presented with left upper lobe lung mass in addition to bilateral pulmonary nodules and mediastinal as well as supraclavicular lymphadenopathy and metastatic bone lesions diagnosed in September 2016.  The patient had evidence for disease progression in April 2017 and blood test for EGFR mutation showed positive T790M resistant mutation.  PRIOR THERAPY:  1) status post subxiphoid pericardial window for cardiac tamponade as well as a right chest tube placement for drainage of right pleural effusion under the care of Dr. Roxy Manns. 2) Palliative radiotherapy to the left lung/left lateral rib as well as a right hip under the care of Dr. Lisbeth Renshaw completed on 01/18/2015. 3) Tarceva 150 mg by mouth daily. Status post 6 months of treatment, discontinued secondary to disease progression.   CURRENT THERAPY: Tagrisso initiated on 06/11/2015.  Subjective: The patient is seen and examined today. His wife was at the bedside. He pleasant 57 years old spent male with stage IV non-small cell lung cancer diagnosed in September 2016 and has positive EGFR mutation. He was treated for 6 months with oral Tarceva but developed disease progression with positive T790M resistant mutation. He was started on treatment with third generation EGFR tyrosine kinase inhibitor, Tagrisso 6 weeks ago. Unfortunately his condition did not show any improvement and the patient continues to have increasing fatigue and weakness as well as weight loss and pain on the right side of the chest. He had repeat CT scan of the head, neck, chest, abdomen and pelvis performed earlier today that showed clear evidence for disease progression in the chest with loculated right pleural effusion as well as likely malignant recurrent pericardial effusion. He was getting 2-D echo at the time of my visit today. He denied having any significant  pain. He was a little bit sleepy. He has no fever or chills.  Objective: Vital signs in last 24 hours: Temp:  [98.5 F (36.9 C)] 98.5 F (36.9 C) (06/13 0859) Pulse Rate:  [105-119] 105 (06/13 1400) Resp:  [12-34] 12 (06/13 1400) BP: (109-161)/(87-95) 149/88 mmHg (06/13 1305) SpO2:  [85 %-99 %] 99 % (06/13 1400) Weight:  [142 lb 6.7 oz (64.6 kg)] 142 lb 6.7 oz (64.6 kg) (06/13 1400)  Intake/Output from previous day:   Intake/Output this shift: Total I/O In: 350 [I.V.:150; IV Piggyback:200] Out: -   General appearance: alert, cooperative and mild distress Resp: diminished breath sounds RLL and dullness to percussion RLL Cardio: regular rate and rhythm, S1, S2 normal, no murmur, click, rub or gallop GI: soft, non-tender; bowel sounds normal; no masses,  no organomegaly Extremities: edema 2+  Lab Results:   Recent Labs  07/27/15 0905  WBC 10.6*  HGB 9.6*  HCT 30.8*  PLT 496*   BMET  Recent Labs  07/27/15 0905  NA 133*  K 4.3  CL 93*  CO2 34*  GLUCOSE 154*  BUN 20  CREATININE 0.67  CALCIUM 8.9    Studies/Results: Dg Chest 2 View  07/27/2015  CLINICAL DATA:  History of lung carcinoma with increased shortness of breath EXAM: CHEST  2 VIEW COMPARISON:  Jul 01, 2015 FINDINGS: Right-sided pleural effusion which appears at least partially has increased in size. There is extensive airspace consolidation throughout the lungs bilaterally with relative confluence in the left perihilar region, stable. There is no new airspace consolidation. Heart is upper normal in size. Pulmonary vascularity appears unremarkable. No adenopathy is evident by radiography. No bone lesions  are evident by radiography. IMPRESSION: Widespread airspace consolidation bilaterally with confluence in the left perihilar region, stable. Increase in size of apparent partially loculated right pleural effusion anteriorly. Cardiac silhouette is overall stable. Electronically Signed   By: Lowella Grip III  M.D.   On: 07/27/2015 09:33   Ct Head Wo Contrast  07/27/2015  CLINICAL DATA:  50 year old male with metastatic lung cancer. Fatigue, anorexia, pain, shortness of breath, cough. Osseous metastatic disease. EXAM: CT HEAD WITHOUT CONTRAST CT NECK WITH CONTRAST TECHNIQUE: Contiguous axial images were obtained from the base of the skull through the vertex without intravenous contrast Multidetector CT imaging of the and neck was performed using the standard protocol following the bolus administration of intravenous contrast. CONTRAST:  128m ISOVUE-300 IOPAMIDOL (ISOVUE-300) INJECTION 61% conjunction with contrast enhanced imaging of the chest, abdomen, and pelvis reported separately. The patient became progressively short of breath following the contrast-enhanced imaging from the neck to the pelvis. Rapid Response was called to the CT department as the patient's O2 level was 87%. He was taken to the ED. Subsequently post-contrast head CT imaging could not be obtained. COMPARISON:  Head CT without contrast 12/11/2014. Brain MRI 11/19/2014 Chest CT 05/08/2015. FINDINGS: CT HEAD FINDINGS Stable and well pneumatized visualized paranasal sinuses and mastoids. No destructive or suspicious osseous lesion of the calvarium identified. Visualized orbits and scalp soft tissues are within normal limits. Cerebral volume is normal. No midline shift, ventriculomegaly, mass effect, evidence of mass lesion, intracranial hemorrhage or evidence of cortically based acute infarction. Gray-white matter differentiation is within normal limits throughout the brain. CT NECK FINDINGS Pharynx and larynx: Negative. Mild motion artifact. Negative parapharyngeal and retropharyngeal spaces. Salivary glands: Negative sublingual space, submandibular glands, and parotid glands. Thyroid: Stable and negative. Lymph nodes: Malignant appearing left level 4 lymph node conglomeration at the thoracic inlet appears mildly enlarged since 5 since 05/08/2015,  currently encompassing 18 x 33 x 18 mm (AP by transverse by CC) versus 16 x 28 x 18 mm previously. Just superior to the main nodal conglomeration there are smaller but abnormally rounded individual nodes up to 9 mm short axis (series 8, image 73). These were not included on the prior chest, and are increased compared to the 11/03/2014 PET-CT (5-6 mm at that time). Bilateral level 2 and level 3 lymph nodes also appear larger than in 2016 measuring 7-10 mm short axis bilaterally. A 10 mm right level IIIa node on series 8, image 52 corresponds to the level of a right lateral neck skin marker. No cystic or necrotic nodes in the neck. Vascular: Major vascular structures in the neck and at the skullbase are patent. No significant carotid bifurcation atherosclerosis. Limited intracranial: Negative. Visualized orbits: Negative. Mastoids and visualized paranasal sinuses: Clear. Skeleton: No acute or suspicious osseous lesion identified in the neck, although there is a 12 mm superior T1 vertebral body lytic lesion on the right which was sclerotic on 05/08/2015 (Series 8, image 74). Upper chest: Reported separately today. IMPRESSION: 1. Left thoracic inlet nodal metastasis appears slightly larger than on 05/08/2015, although the differences might be related to better visualization today given dedicated Neck imaging. Still, smaller but increased nearby and bilateral level II/III cervical nodes compared to the 11/03/2014 PET-CT do suggest progression of neck nodal metastases. 2. No other metastatic disease identified in the neck. 3. T1 vertebral bone lesion has become lytic since 05/08/2015 suggesting progression of bone metastases. See also CT Chest Abdomen and Pelvis today reported separately. 4. Postcontrast head CT could not  be performed. No evidence of metastatic disease to the brain on noncontrast head CT images. Electronically Signed   By: Genevie Ann M.D.   On: 07/27/2015 11:01   Ct Head W Contrast  07/27/2015  CLINICAL  DATA:  50 year old male with metastatic lung cancer. Fatigue, anorexia, pain, shortness of breath, cough. Osseous metastatic disease. Unable to have post-contrast restaging CT images of the brain this morning due to shortness of breath and rapid response at that time. Presents following CTA chest at 1249 hours today for completion of post-contrast brain imaging. EXAM: CT HEAD WITH CONTRAST TECHNIQUE: Contiguous axial images were obtained from the base of the skull through the vertex with intravenous contrast. CONTRAST:  In conjunction with the CTA chest reported separately which utilized 80 mL Isovue 370 COMPARISON:  Noncontrast head CT 0819 hours today and earlier. FINDINGS: Stable and well pneumatized visualized paranasal sinuses and mastoids. No osseous abnormality identified. No midline shift, ventriculomegaly, mass effect, evidence of mass lesion, or evidence of cortically based acute infarction. Gray-white matter differentiation is within normal limits throughout the brain. No abnormal enhancement identified. Major intracranial vascular structures are enhancing. IMPRESSION: No metastatic disease to the brain. Electronically Signed   By: Genevie Ann M.D.   On: 07/27/2015 13:23   Ct Soft Tissue Neck W Contrast  07/27/2015  CLINICAL DATA:  50 year old male with metastatic lung cancer. Fatigue, anorexia, pain, shortness of breath, cough. Osseous metastatic disease. See also Contrast section below. EXAM: CT HEAD WITHOUT CONTRAST CT NECK WITH CONTRAST TECHNIQUE: Contiguous axial images were obtained from the base of the skull through the vertex without intravenous contrast Multidetector CT imaging of the and neck was performed using the standard protocol following the bolus administration of intravenous contrast. CONTRAST:  13m ISOVUE-300 IOPAMIDOL (ISOVUE-300) INJECTION 61% in conjunction with contrast enhanced imaging of the chest, abdomen, and pelvis reported separately. The patient became progressively short of  breath following the contrast-enhanced imaging from the neck to the pelvis. Rapid Response was called to the CT department as the patient's O2 level was 87%. He was taken to the ED. Subsequently post-contrast head CT imaging could not be obtained. COMPARISON:  Head CT without contrast 12/11/2014. Brain MRI 11/19/2014 Chest CT 05/08/2015. FINDINGS: CT HEAD FINDINGS Stable and well pneumatized visualized paranasal sinuses and mastoids. No destructive or suspicious osseous lesion of the calvarium identified. Visualized orbits and scalp soft tissues are within normal limits. Cerebral volume is normal. No midline shift, ventriculomegaly, mass effect, evidence of mass lesion, intracranial hemorrhage or evidence of cortically based acute infarction. Gray-white matter differentiation is within normal limits throughout the brain. CT NECK FINDINGS Pharynx and larynx: Negative. Mild motion artifact. Negative parapharyngeal and retropharyngeal spaces. Salivary glands: Negative sublingual space, submandibular glands, and parotid glands. Thyroid: Stable and negative. Lymph nodes: Malignant appearing left level 4 lymph node conglomeration at the thoracic inlet appears mildly enlarged since 5 since 05/08/2015, currently encompassing 18 x 33 x 18 mm (AP by transverse by CC) versus 16 x 28 x 18 mm previously. Just superior to the main nodal conglomeration there are smaller but abnormally rounded individual nodes up to 9 mm short axis (series 8, image 73). These were not included on the prior chest, and are increased compared to the 11/03/2014 PET-CT (5-6 mm at that time). Bilateral level 2 and level 3 lymph nodes also appear larger than in 2016 measuring 7-10 mm short axis bilaterally. A 10 mm right level IIIa node on series 8, image 52 corresponds to the level  of a right lateral neck skin marker. No cystic or necrotic nodes in the neck. Vascular: Major vascular structures in the neck and at the skullbase are patent. No significant  carotid bifurcation atherosclerosis. Limited intracranial: Negative. Visualized orbits: Negative. Mastoids and visualized paranasal sinuses: Clear. Skeleton: No acute or suspicious osseous lesion identified in the neck, although there is a 12 mm superior T1 vertebral body lytic lesion on the right which was sclerotic on 05/08/2015 (Series 8, image 74). Upper chest: Reported separately today. IMPRESSION: 1. Left thoracic inlet nodal metastasis appears slightly larger than on 05/08/2015, although the differences might be related to better visualization today given dedicated Neck imaging. Still, smaller but increased nearby and bilateral level II/III cervical nodes compared to the 11/03/2014 PET-CT do suggest progression of neck nodal metastases. 2. No other metastatic disease identified in the neck. 3. T1 vertebral bone lesion has become lytic since 05/08/2015 suggesting progression of bone metastases. See also CT Chest Abdomen and Pelvis today reported separately. 4. Postcontrast head CT could not be performed. No evidence of metastatic disease to the brain on noncontrast head CT images. Electronically Signed   By: Genevie Ann M.D.   On: 07/27/2015 09:35   Ct Chest W Contrast  07/27/2015  CLINICAL DATA:  Metastatic lung cancer. Osseous metastatic disease. Right pleural effusion. EXAM: CT CHEST, ABDOMEN, AND PELVIS WITH CONTRAST TECHNIQUE: Multidetector CT imaging of the chest, abdomen and pelvis was performed following the standard protocol during bolus administration of intravenous contrast. CONTRAST:  112m ISOVUE-300 IOPAMIDOL (ISOVUE-300) INJECTION 61% COMPARISON:  03/02/2015 FINDINGS: CT CHEST FINDINGS Mediastinum/Lymph Nodes: Normal heart size. New pericardial effusion identified. No axillary or supraclavicular adenopathy peer indistinct soft tissue stranding within the mediastinum is again identified without pathologically enlarged mediastinal lymph nodes. Lungs/Pleura: New large right pleural effusion which  appears partially loculated. There is significantly diminished aeration to the right lung. Interval development of innumerable pulmonary nodular opacities throughout both lungs when compared with the most recent staging CT from 03/02/2015. Large perihilar confluent opacities are new from previous exam compatible with significant interval progression of disease. There is evidence of index perihilar opacity involving the left lung measures 11.3 x 5.1 cm, image 68 of series 10. The index perihilar density within the right midlung measures 8.5 x 3.7 cm, image 69 of series 10. Interstitial thickening throughout the right upper lobe is new from previous exam and is worrisome for lymphangitic spread of tumor. Musculoskeletal: Multifocal mixed lytic and bone metastases are identified. New index lesion within the sternum measures 1.7 cm, image 85 of series 606. Progression of sclerotic bone metastasis is involving the T2 vertebra, image number 83 of series 606. New lytic lesion involving the superior endplate of TF74is identified, image 80 of series 606. CT ABDOMEN PELVIS FINDINGS Hepatobiliary: The index lesion within the medial segment of left lobe of liver measures 1.9 x 1.6 cm, image number 56 of series 5. Previously 2.9 x 2.4 cm. Mild gallbladder wall edema. Nonspecific. Lesion within the lateral segment of left lobe measures 1.6 cm, image 55 of series 5. Unchanged from previous exam. Pancreas: No mass, inflammatory changes, or other significant abnormality. Spleen: The spleen is unremarkable. Adrenals/Urinary Tract: The adrenal glands are normal. Bilateral striated nephrograms are noted involving both kidneys. The urinary bladder is unremarkable. Stomach/Bowel: The stomach is normal. The small bowel loops have a normal course and caliber. Vascular/Lymphatic: Aortic atherosclerosis. Interval development of multiple prominent retroperitoneal lymph nodes. Index node in the periaortic region measures 1 cm, image  73 of  series 5. 9 mm periaortic lymph node is also new, image number 70 of series 5. No enlarged pelvic or inguinal lymph nodes. Reproductive: No mass or other significant abnormality. Other: No ascites.  No peritoneal nodularity or mass identified. Musculoskeletal: New lytic lesion identified within the left iliac bone measures 11 mm, image 94 of series 5. Right iliac bone lytic lesion is also new measuring 3 cm, image 94 of series 5. Right iliac bone lytic lesion is new measuring 2.2 cm, image 100 of series 5. IMPRESSION: 1. Interval progression of disease. 2. New loculated right pleural effusion and new pericardial effusion. Suspect metastatic effusions. 3. Marked progression of pulmonary metastases. Suspect lymphangitic spread of tumor within the right upper lobe. 4. Interval progression of multifocal lytic and sclerotic bone metastases. 5. New upper abdominal retroperitoneal adenopathy 6. No significant change in the appearance of multifocal indeterminate liver lesions. 7. Striated nephrogram is noted involving both kidneys. This is a nonspecific finding but may be seen with vascular insult as well as a pyelonephritis. Clinical correlation advise. Electronically Signed   By: Kerby Moors M.D.   On: 07/27/2015 09:45   Ct Angio Chest Pe W/cm &/or Wo Cm  07/27/2015  CLINICAL DATA:  History of metastatic lung carcinoma. Patient underwent CT imaging of head, neck, chest abdomen and pelvis this morning. Patient became very short of breath following the CT imaging this morning. EXAM: CT ANGIOGRAPHY CHEST WITH CONTRAST TECHNIQUE: Multidetector CT imaging of the chest was performed using the standard protocol during bolus administration of intravenous contrast. Multiplanar CT image reconstructions and MIPs were obtained to evaluate the vascular anatomy. CONTRAST:  100 mL of Isovue 370 intravenous contrast COMPARISON:  07/27/2015 FINDINGS: Angiographic study: There is no evidence of a pulmonary embolism. The great vessels  normal in caliber. No aortic dissection plaque. Neck base and axilla: Prominent to mildly enlarged neck base lymph nodes, largest on the left measuring 12 mm short axis. Mediastinum and hila: Heart normal size. Fxs new small moderate pericardial effusion stable the earlier CT. Ill-defined lymph nodes, most prominent subcarinal region are stable from prior CT. Abnormal soft tissue surrounds both hila consistent confluent metastatic adenopathy. Lungs and pleura: Numerous ill-defined pulmonary nodules, reticulonodular interstitial thickening, more confluent perihilar opacities and small to moderate right and minimal left pleural effusions are again noted consistent with extensive metastatic disease to the lungs including lymphangitic spread of carcinoma and probable malignant pleural effusions. This is stable from the earlier CT. Musculoskeletal: Mixed lytic and sclerotic bone lesions reflecting metastatic disease are again noted unchanged from the earlier CT. Review of the MIP images confirms the above findings. IMPRESSION: 1. No evidence of a pulmonary embolism. 2. No change from the CT scan obtained earlier this same date. 3. Significant progression of metastatic disease lung carcinoma as detailed above and in the previous CT report. Electronically Signed   By: Lajean Manes M.D.   On: 07/27/2015 13:18   Ct Abdomen Pelvis W Contrast  07/27/2015  CLINICAL DATA:  Metastatic lung cancer. Osseous metastatic disease. Right pleural effusion. EXAM: CT CHEST, ABDOMEN, AND PELVIS WITH CONTRAST TECHNIQUE: Multidetector CT imaging of the chest, abdomen and pelvis was performed following the standard protocol during bolus administration of intravenous contrast. CONTRAST:  149m ISOVUE-300 IOPAMIDOL (ISOVUE-300) INJECTION 61% COMPARISON:  03/02/2015 FINDINGS: CT CHEST FINDINGS Mediastinum/Lymph Nodes: Normal heart size. New pericardial effusion identified. No axillary or supraclavicular adenopathy peer indistinct soft tissue  stranding within the mediastinum is again identified without pathologically enlarged  mediastinal lymph nodes. Lungs/Pleura: New large right pleural effusion which appears partially loculated. There is significantly diminished aeration to the right lung. Interval development of innumerable pulmonary nodular opacities throughout both lungs when compared with the most recent staging CT from 03/02/2015. Large perihilar confluent opacities are new from previous exam compatible with significant interval progression of disease. There is evidence of index perihilar opacity involving the left lung measures 11.3 x 5.1 cm, image 68 of series 10. The index perihilar density within the right midlung measures 8.5 x 3.7 cm, image 69 of series 10. Interstitial thickening throughout the right upper lobe is new from previous exam and is worrisome for lymphangitic spread of tumor. Musculoskeletal: Multifocal mixed lytic and bone metastases are identified. New index lesion within the sternum measures 1.7 cm, image 85 of series 606. Progression of sclerotic bone metastasis is involving the T2 vertebra, image number 83 of series 606. New lytic lesion involving the superior endplate of Y10 is identified, image 80 of series 606. CT ABDOMEN PELVIS FINDINGS Hepatobiliary: The index lesion within the medial segment of left lobe of liver measures 1.9 x 1.6 cm, image number 56 of series 5. Previously 2.9 x 2.4 cm. Mild gallbladder wall edema. Nonspecific. Lesion within the lateral segment of left lobe measures 1.6 cm, image 55 of series 5. Unchanged from previous exam. Pancreas: No mass, inflammatory changes, or other significant abnormality. Spleen: The spleen is unremarkable. Adrenals/Urinary Tract: The adrenal glands are normal. Bilateral striated nephrograms are noted involving both kidneys. The urinary bladder is unremarkable. Stomach/Bowel: The stomach is normal. The small bowel loops have a normal course and caliber. Vascular/Lymphatic:  Aortic atherosclerosis. Interval development of multiple prominent retroperitoneal lymph nodes. Index node in the periaortic region measures 1 cm, image 73 of series 5. 9 mm periaortic lymph node is also new, image number 70 of series 5. No enlarged pelvic or inguinal lymph nodes. Reproductive: No mass or other significant abnormality. Other: No ascites.  No peritoneal nodularity or mass identified. Musculoskeletal: New lytic lesion identified within the left iliac bone measures 11 mm, image 94 of series 5. Right iliac bone lytic lesion is also new measuring 3 cm, image 94 of series 5. Right iliac bone lytic lesion is new measuring 2.2 cm, image 100 of series 5. IMPRESSION: 1. Interval progression of disease. 2. New loculated right pleural effusion and new pericardial effusion. Suspect metastatic effusions. 3. Marked progression of pulmonary metastases. Suspect lymphangitic spread of tumor within the right upper lobe. 4. Interval progression of multifocal lytic and sclerotic bone metastases. 5. New upper abdominal retroperitoneal adenopathy 6. No significant change in the appearance of multifocal indeterminate liver lesions. 7. Striated nephrogram is noted involving both kidneys. This is a nonspecific finding but may be seen with vascular insult as well as a pyelonephritis. Clinical correlation advise. Electronically Signed   By: Kerby Moors M.D.   On: 07/27/2015 09:45   Dg Chest Portable 1 View  07/27/2015  CLINICAL DATA:  Post thoracentesis.  Lung cancer EXAM: PORTABLE CHEST 1 VIEW COMPARISON:  07/27/2015 FINDINGS: Improvement in right pleural effusion. Small residual right effusion. Negative for pneumothorax Diffuse bilateral nodular densities post consistent with metastatic disease. This is slightly more prominent. Superimposed pneumonia or edema on the right cannot be excluded. IMPRESSION: Negative for pneumothorax post right thoracentesis Diffuse bilateral nodular densities compatible with metastatic  disease. Progression of airspace disease on the right may represent pneumonia or edema. Electronically Signed   By: Franchot Gallo M.D.   On:  07/27/2015 12:05    Medications: I have reviewed the patient's current medications.  CODE STATUS: Still full code but they are considering no CODE BLUE  Assessment/Plan: 1) stage IV non-small cell lung cancer, adenocarcinoma with positive EGFR mutation status post treatment with targeted therapy with Tarceva initially then third generation EGFR tyrosine kinase inhibitor, Tagrisso. Unfortunately the patient continues to have significant disease progression with no response to the recent treatment. His options are very limited especially with the significant deterioration of his condition. I do think the patient will be a good candidate for systemic chemotherapy at this point because of his poor performance status. I had a lengthy discussion with the patient and his wife about his condition. I strongly recommended for them to consider palliative care and hospice at this point. The patient does not have full understanding of the hospice service. I will consult the palliative care team for evaluation and discussion of goals of cares. His wife understands his poor condition and in agreement to proceed with the hospice approach. 2) recurrent right malignant pleural effusion: It is a loculated effusion and I'm not sure how much the patient will benefit from repeat ultrasound-guided thoracentesis. 3) recurrent malignant pericardial effusion: He is currently undergoing 2-D echo and may benefit from reevaluation by cardiology or cardiothoracic surgery if it is consistent with cardiac tamponade. 4) CODE STATUS: The patient is still undecided because of lack of understanding of the current situation but his wife is in agreement with no CODE BLUE. We will ask the palliative care team to discuss the CODE STATUS again with the patient and his other family members in the presence  of a Spanish interpreter. 5) prognosis: Very poor. Thank you so much for taking good care of Mr. Jacelyn Grip, I will continue to follow up the patient with you and assist in his management on as-needed basis.  LOS: 0 days    Lalanya Rufener K. 07/27/2015

## 2015-07-27 NOTE — ED Notes (Signed)
Patient transported to CT 

## 2015-07-27 NOTE — H&P (Signed)
PULMONARY / CRITICAL CARE MEDICINE   Name: Steven Roberts MRN: [id number] DOB: Jan 18, 1966    ADMISSION DATE:  07/27/2015 CONSULTATION DATE:  07/27/15  REFERRING MD:  ED  CHIEF COMPLAINT:  Hypercarbic, hypoxic respiratory failure.  HISTORY OF PRESENT ILLNESS:   Mr. 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 He has a history of pericardial effusion with cardiac tamponade, right pleural effusion status post pericardial window.  He has EGFR positive mutation. 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  He underwent a restaging CT today. Rapid response called for acute dyspnea, hypoxia. CT images reviewed which show progression of lung cancer, worsening right effusion, pneumonitis. ABG shows hypercarbic hypoxic respiratory failure. PCCM called for admission.  PAST MEDICAL HISTORY :  He  has a past medical history of Bone cancer ([place]) (11/03/14 Pet scan); Lung cancer ([place]) (10/23/14); Pericardial effusion with cardiac tamponade (12/11/2014); Pulmonary infiltrates  c/w [place]/ Stage IV  (10/13/2014); Non-small cell carcinoma of lung, stage 4 ([place]) (11/12/2014); Hypoalbuminemia due to protein-calorie malnutrition ([place]) (12/02/2014); Hyponatremia (12/10/2014); Hyperphosphatemia (12/02/2014); Bone metastasis ([place]) (11/25/2014); S/P radiation therapy (12/07/14-01/18/15); Diabetes mellitus without complication ([place]); Encounter for antineoplastic chemotherapy (03/09/2015); and Pulmonary hypertension [facility]): Per 2 d echo 06/03/2015 (06/03/2015).  PAST SURGICAL HISTORY: He  has past surgical history that includes Video bronchoscopy (Bilateral, 10/23/2014); Subxyphoid pericardial window (N/A, 12/11/2014); and TEE without cardioversion (N/A, 12/11/2014).  No Known Allergies  No current facility-administered medications on file prior to encounter.   Current Outpatient Prescriptions on File Prior to Encounter  Medication  Sig  . HYDROcodone-acetaminophen (NORCO/VICODIN) 5-325 MG tablet Take 1-2 tablets by mouth every 6 (six) hours as needed for moderate pain.  Marland Kitchen ibuprofen (ADVIL) 200 MG tablet Take 400 mg by mouth every 6 (six) hours as needed for mild pain. Reported on 03/09/2015  . osimertinib mesylate (TAGRISSO) 80 MG tablet Take 1 tablet (80 mg total) by mouth daily.  Marland Kitchen albuterol (PROVENTIL) (2.5 MG/3ML) 0.083% nebulizer solution Take 3 mLs (2.5 mg total) by nebulization every 6 (six) hours as needed for wheezing or shortness of breath. (Patient not taking: Reported on 07/08/2015)  . benzonatate (TESSALON) 200 MG capsule Take 1 capsule (200 mg total) by mouth 3 (three) times daily. (Patient not taking: Reported on 07/08/2015)  . dexamethasone (DECADRON) 4 MG tablet 4 mg by mouth twice a day the day before, day of and day after the chemotherapy every 3 weeks (Patient not taking: Reported on 06/10/2015)  . feeding supplement, ENSURE ENLIVE, (ENSURE ENLIVE) LIQD Take 237 mLs by mouth 2 (two) times daily between meals. (Patient not taking: Reported on 07/08/2015)  . fentaNYL (DURAGESIC - DOSED MCG/HR) 50 MCG/HR Place 1 patch (50 mcg total) onto the skin every 3 (three) days. (Patient not taking: Reported on 07/08/2015)  . Fluticasone-Salmeterol (ADVAIR) 250-50 MCG/DOSE AEPB Inhale 1 puff into the lungs 2 (two) times daily. (Patient not taking: Reported on 07/08/2015)  . folic acid (FOLVITE) 1 MG tablet Take 1 tablet (1 mg total) by mouth daily. (Patient not taking: Reported on 06/10/2015)  . glimepiride (AMARYL) 2 MG tablet Take 1 tablet (2 mg total) by mouth daily with breakfast. (Patient not taking: Reported on 06/10/2015)  . HYDROcodone-homatropine (HYCODAN) 5-1.5 MG/5ML syrup Take 5 mLs by mouth every 6 (six) hours as needed for cough. (Patient not taking: Reported on 07/27/2015)  . morphine (MS CONTIN) 15 MG 12 hr tablet Take 1 tablet (15 mg total) by mouth  every 12 (twelve) hours. (Patient not taking: Reported on 07/27/2015)   . pantoprazole (PROTONIX) 40 MG tablet Take 1 tablet (40 mg total) by mouth daily. (Patient not taking: Reported on 07/08/2015)  . tiotropium (SPIRIVA HANDIHALER) 18 MCG inhalation capsule Place 1 capsule (18 mcg total) into inhaler and inhale daily. (Patient not taking: Reported on 07/08/2015)    FAMILY HISTORY:  His indicated that his mother is alive. He indicated that his father is deceased.   SOCIAL HISTORY: He  reports that he has never smoked. He has never used smokeless tobacco. He reports that he does not drink alcohol or use illicit drugs.  REVIEW OF SYSTEMS:   Complains of worsening dyspnea, cough. No sputum production, fevers, chills. Complains of right chest pain, worsening on inspiration. No palpitations. No nausea, vomiting, diarrhea, consultation. All other review of systems are negative.  SUBJECTIVE:    VITAL SIGNS: BP 134/90 mmHg  Pulse 111  Temp(Src) 98.5 F (36.9 C) (Oral)  Resp 22  SpO2 95%  HEMODYNAMICS:    VENTILATOR SETTINGS:    INTAKE / OUTPUT:    PHYSICAL EXAMINATION: General:  Moderate distress, Neuro: Awake, alert, oriented. No gross focal deficits HEENT:  Moist mucous membranes, no thyromegaly, JVD Cardiovascular:  Tachycardia, regular rate. No murmurs rubs gallops Lungs:  Bilateral crackles, diminished breath sounds with dullness to percussion at right base Abdomen:  Soft, positive bowel sounds Musculoskeletal: 3+ peripheral pitting edema Skin:  Intact  LABS:  BMET  Recent Labs Lab 07/22/15 0915 07/27/15 0905  NA 134* 133*  K 4.4 4.3  CL  --  93*  CO2 33* 34*  BUN 11.2 20  CREATININE 0.7 0.67  GLUCOSE 168* 154*    Electrolytes  Recent Labs Lab 07/22/15 0915 07/27/15 0905  CALCIUM 9.3 8.9    CBC  Recent Labs Lab 07/22/15 0915 07/27/15 0905  WBC 7.7 10.6*  HGB 10.0* 9.6*  HCT 32.0* 30.8*  PLT 469* 496*    Coag's No results for input(s): APTT, INR in the last 168 hours.  Sepsis Markers  Recent Labs Lab  07/27/15 0917  LATICACIDVEN 0.98    ABG  Recent Labs Lab 07/27/15 0930  PHART 7.357  PCO2ART 64.3*  PO2ART 75.4*    Liver Enzymes  Recent Labs Lab 07/22/15 0915 07/27/15 0905  AST 11 24  ALT <9 20  ALKPHOS 112 134*  BILITOT 0.38 0.5  ALBUMIN 2.3* 2.5*    Cardiac Enzymes No results for input(s): TROPONINI, PROBNP in the last 168 hours.  Glucose No results for input(s): GLUCAP in the last 168 hours.  Imaging Dg Chest 2 View  07/27/2015  CLINICAL DATA:  History of lung carcinoma with increased shortness of breath EXAM: CHEST  2 VIEW COMPARISON:  Jul 01, 2015 FINDINGS: Right-sided pleural effusion which appears at least partially has increased in size. There is extensive airspace consolidation throughout the lungs bilaterally with relative confluence in the left perihilar region, stable. There is no new airspace consolidation. Heart is upper normal in size. Pulmonary vascularity appears unremarkable. No adenopathy is evident by radiography. No bone lesions are evident by radiography. IMPRESSION: Widespread airspace consolidation bilaterally with confluence in the left perihilar region, stable. Increase in size of apparent partially loculated right pleural effusion anteriorly. Cardiac silhouette is overall stable. Electronically Signed   By: Lowella Grip III M.D.   On: 07/27/2015 09:33   Ct Soft Tissue Neck W Contrast  07/27/2015  CLINICAL DATA:  50 year old male with metastatic lung cancer. Fatigue, anorexia, pain,  shortness of breath, cough. Osseous metastatic disease. See also Contrast section below. EXAM: CT HEAD WITHOUT CONTRAST CT NECK WITH CONTRAST TECHNIQUE: Contiguous axial images were obtained from the base of the skull through the vertex without intravenous contrast Multidetector CT imaging of the and neck was performed using the standard protocol following the bolus administration of intravenous contrast. CONTRAST:  190m ISOVUE-300 IOPAMIDOL (ISOVUE-300) INJECTION  61% in conjunction with contrast enhanced imaging of the chest, abdomen, and pelvis reported separately. The patient became progressively short of breath following the contrast-enhanced imaging from the neck to the pelvis. Rapid Response was called to the CT department as the patient's O2 level was 87%. He was taken to the ED. Subsequently post-contrast head CT imaging could not be obtained. COMPARISON:  Head CT without contrast 12/11/2014. Brain MRI 11/19/2014 Chest CT 05/08/2015. FINDINGS: CT HEAD FINDINGS Stable and well pneumatized visualized paranasal sinuses and mastoids. No destructive or suspicious osseous lesion of the calvarium identified. Visualized orbits and scalp soft tissues are within normal limits. Cerebral volume is normal. No midline shift, ventriculomegaly, mass effect, evidence of mass lesion, intracranial hemorrhage or evidence of cortically based acute infarction. Gray-white matter differentiation is within normal limits throughout the brain. CT NECK FINDINGS Pharynx and larynx: Negative. Mild motion artifact. Negative parapharyngeal and retropharyngeal spaces. Salivary glands: Negative sublingual space, submandibular glands, and parotid glands. Thyroid: Stable and negative. Lymph nodes: Malignant appearing left level 4 lymph node conglomeration at the thoracic inlet appears mildly enlarged since 5 since 05/08/2015, currently encompassing 18 x 33 x 18 mm (AP by transverse by CC) versus 16 x 28 x 18 mm previously. Just superior to the main nodal conglomeration there are smaller but abnormally rounded individual nodes up to 9 mm short axis (series 8, image 73). These were not included on the prior chest, and are increased compared to the 11/03/2014 PET-CT (5-6 mm at that time). Bilateral level 2 and level 3 lymph nodes also appear larger than in 2016 measuring 7-10 mm short axis bilaterally. A 10 mm right level IIIa node on series 8, image 52 corresponds to the level of a right lateral neck skin  marker. No cystic or necrotic nodes in the neck. Vascular: Major vascular structures in the neck and at the skullbase are patent. No significant carotid bifurcation atherosclerosis. Limited intracranial: Negative. Visualized orbits: Negative. Mastoids and visualized paranasal sinuses: Clear. Skeleton: No acute or suspicious osseous lesion identified in the neck, although there is a 12 mm superior T1 vertebral body lytic lesion on the right which was sclerotic on 05/08/2015 (Series 8, image 74). Upper chest: Reported separately today. IMPRESSION: 1. Left thoracic inlet nodal metastasis appears slightly larger than on 05/08/2015, although the differences might be related to better visualization today given dedicated Neck imaging. Still, smaller but increased nearby and bilateral level II/III cervical nodes compared to the 11/03/2014 PET-CT do suggest progression of neck nodal metastases. 2. No other metastatic disease identified in the neck. 3. T1 vertebral bone lesion has become lytic since 05/08/2015 suggesting progression of bone metastases. See also CT Chest Abdomen and Pelvis today reported separately. 4. Postcontrast head CT could not be performed. No evidence of metastatic disease to the brain on noncontrast head CT images. Electronically Signed   By: HGenevie AnnM.D.   On: 07/27/2015 09:35   Ct Chest W Contrast  07/27/2015  CLINICAL DATA:  Metastatic lung cancer. Osseous metastatic disease. Right pleural effusion. EXAM: CT CHEST, ABDOMEN, AND PELVIS WITH CONTRAST TECHNIQUE: Multidetector CT imaging of  the chest, abdomen and pelvis was performed following the standard protocol during bolus administration of intravenous contrast. CONTRAST:  186m ISOVUE-300 IOPAMIDOL (ISOVUE-300) INJECTION 61% COMPARISON:  03/02/2015 FINDINGS: CT CHEST FINDINGS Mediastinum/Lymph Nodes: Normal heart size. New pericardial effusion identified. No axillary or supraclavicular adenopathy peer indistinct soft tissue stranding within the  mediastinum is again identified without pathologically enlarged mediastinal lymph nodes. Lungs/Pleura: New large right pleural effusion which appears partially loculated. There is significantly diminished aeration to the right lung. Interval development of innumerable pulmonary nodular opacities throughout both lungs when compared with the most recent staging CT from 03/02/2015. Large perihilar confluent opacities are new from previous exam compatible with significant interval progression of disease. There is evidence of index perihilar opacity involving the left lung measures 11.3 x 5.1 cm, image 68 of series 10. The index perihilar density within the right midlung measures 8.5 x 3.7 cm, image 69 of series 10. Interstitial thickening throughout the right upper lobe is new from previous exam and is worrisome for lymphangitic spread of tumor. Musculoskeletal: Multifocal mixed lytic and bone metastases are identified. New index lesion within the sternum measures 1.7 cm, image 85 of series 606. Progression of sclerotic bone metastasis is involving the T2 vertebra, image number 83 of series 606. New lytic lesion involving the superior endplate of TE93is identified, image 80 of series 606. CT ABDOMEN PELVIS FINDINGS Hepatobiliary: The index lesion within the medial segment of left lobe of liver measures 1.9 x 1.6 cm, image number 56 of series 5. Previously 2.9 x 2.4 cm. Mild gallbladder wall edema. Nonspecific. Lesion within the lateral segment of left lobe measures 1.6 cm, image 55 of series 5. Unchanged from previous exam. Pancreas: No mass, inflammatory changes, or other significant abnormality. Spleen: The spleen is unremarkable. Adrenals/Urinary Tract: The adrenal glands are normal. Bilateral striated nephrograms are noted involving both kidneys. The urinary bladder is unremarkable. Stomach/Bowel: The stomach is normal. The small bowel loops have a normal course and caliber. Vascular/Lymphatic: Aortic  atherosclerosis. Interval development of multiple prominent retroperitoneal lymph nodes. Index node in the periaortic region measures 1 cm, image 73 of series 5. 9 mm periaortic lymph node is also new, image number 70 of series 5. No enlarged pelvic or inguinal lymph nodes. Reproductive: No mass or other significant abnormality. Other: No ascites.  No peritoneal nodularity or mass identified. Musculoskeletal: New lytic lesion identified within the left iliac bone measures 11 mm, image 94 of series 5. Right iliac bone lytic lesion is also new measuring 3 cm, image 94 of series 5. Right iliac bone lytic lesion is new measuring 2.2 cm, image 100 of series 5. IMPRESSION: 1. Interval progression of disease. 2. New loculated right pleural effusion and new pericardial effusion. Suspect metastatic effusions. 3. Marked progression of pulmonary metastases. Suspect lymphangitic spread of tumor within the right upper lobe. 4. Interval progression of multifocal lytic and sclerotic bone metastases. 5. New upper abdominal retroperitoneal adenopathy 6. No significant change in the appearance of multifocal indeterminate liver lesions. 7. Striated nephrogram is noted involving both kidneys. This is a nonspecific finding but may be seen with vascular insult as well as a pyelonephritis. Clinical correlation advise. Electronically Signed   By: TKerby MoorsM.D.   On: 07/27/2015 09:45   Ct Abdomen Pelvis W Contrast  07/27/2015  CLINICAL DATA:  Metastatic lung cancer. Osseous metastatic disease. Right pleural effusion. EXAM: CT CHEST, ABDOMEN, AND PELVIS WITH CONTRAST TECHNIQUE: Multidetector CT imaging of the chest, abdomen and pelvis was performed  following the standard protocol during bolus administration of intravenous contrast. CONTRAST:  125m ISOVUE-300 IOPAMIDOL (ISOVUE-300) INJECTION 61% COMPARISON:  03/02/2015 FINDINGS: CT CHEST FINDINGS Mediastinum/Lymph Nodes: Normal heart size. New pericardial effusion identified. No  axillary or supraclavicular adenopathy peer indistinct soft tissue stranding within the mediastinum is again identified without pathologically enlarged mediastinal lymph nodes. Lungs/Pleura: New large right pleural effusion which appears partially loculated. There is significantly diminished aeration to the right lung. Interval development of innumerable pulmonary nodular opacities throughout both lungs when compared with the most recent staging CT from 03/02/2015. Large perihilar confluent opacities are new from previous exam compatible with significant interval progression of disease. There is evidence of index perihilar opacity involving the left lung measures 11.3 x 5.1 cm, image 68 of series 10. The index perihilar density within the right midlung measures 8.5 x 3.7 cm, image 69 of series 10. Interstitial thickening throughout the right upper lobe is new from previous exam and is worrisome for lymphangitic spread of tumor. Musculoskeletal: Multifocal mixed lytic and bone metastases are identified. New index lesion within the sternum measures 1.7 cm, image 85 of series 606. Progression of sclerotic bone metastasis is involving the T2 vertebra, image number 83 of series 606. New lytic lesion involving the superior endplate of TN62is identified, image 80 of series 606. CT ABDOMEN PELVIS FINDINGS Hepatobiliary: The index lesion within the medial segment of left lobe of liver measures 1.9 x 1.6 cm, image number 56 of series 5. Previously 2.9 x 2.4 cm. Mild gallbladder wall edema. Nonspecific. Lesion within the lateral segment of left lobe measures 1.6 cm, image 55 of series 5. Unchanged from previous exam. Pancreas: No mass, inflammatory changes, or other significant abnormality. Spleen: The spleen is unremarkable. Adrenals/Urinary Tract: The adrenal glands are normal. Bilateral striated nephrograms are noted involving both kidneys. The urinary bladder is unremarkable. Stomach/Bowel: The stomach is normal. The small  bowel loops have a normal course and caliber. Vascular/Lymphatic: Aortic atherosclerosis. Interval development of multiple prominent retroperitoneal lymph nodes. Index node in the periaortic region measures 1 cm, image 73 of series 5. 9 mm periaortic lymph node is also new, image number 70 of series 5. No enlarged pelvic or inguinal lymph nodes. Reproductive: No mass or other significant abnormality. Other: No ascites.  No peritoneal nodularity or mass identified. Musculoskeletal: New lytic lesion identified within the left iliac bone measures 11 mm, image 94 of series 5. Right iliac bone lytic lesion is also new measuring 3 cm, image 94 of series 5. Right iliac bone lytic lesion is new measuring 2.2 cm, image 100 of series 5. IMPRESSION: 1. Interval progression of disease. 2. New loculated right pleural effusion and new pericardial effusion. Suspect metastatic effusions. 3. Marked progression of pulmonary metastases. Suspect lymphangitic spread of tumor within the right upper lobe. 4. Interval progression of multifocal lytic and sclerotic bone metastases. 5. New upper abdominal retroperitoneal adenopathy 6. No significant change in the appearance of multifocal indeterminate liver lesions. 7. Striated nephrogram is noted involving both kidneys. This is a nonspecific finding but may be seen with vascular insult as well as a pyelonephritis. Clinical correlation advise. Electronically Signed   By: TKerby MoorsM.D.   On: 07/27/2015 09:45     STUDIES:  CT chest/abd/pelvis 6/13 > progression of disease. Right pleural effusion, pericardial effusion. Images reviewed.  CULTURES: Bcx 6/13 >  Ucx 6/13 > Sputum Cx 6/13>  ANTIBIOTICS: Vanco 6/13 > Zosyn 6/13>   SIGNIFICANT EVENTS: 6/13 > Admit with resp failure  LINES/TUBES:   DISCUSSION: Mr. Wilmeth has Stage IV lung cancer and is on Tagrisso therapy. Admitted with hypoxic, hypercarbic resp failure DDx include progression of cancer with re development  of pleural, pericardial effusion, infection, PE or pulmonary toxicity of Tagrisso.  ASSESSMENT / PLAN:  PULMONARY A: Hypoxic, hypercarbic resp failure Rt pleural effusion Wheezing on examination. ? COPD exacerbation. (he is a non smoker) P:   Bipap support Recheck ABG Monitor in ICU for intubation need Plan for thoracentesis Abx as above Start Duonebs and steroids. CTA to eval for PE  CARDIOVASCULAR A:  H/O Malignant pericardial effusion s/p window P:  Echocardiogram to reassess effusion Follow LA, troponins  RENAL A:   Stable P:   Monitor urine output and Cr  GASTROINTESTINAL A:   Stable P:   Keep NPO  HEMATOLOGIC A:   Mild leukocytosis P: Recheck CBC  INFECTIOUS A:   Empiric treatment for HCAP P:   Vanco, zosyn Check Pct  ENDOCRINE A:   DM P:   SSI coverage  NEUROLOGIC A:   Stable P:    FAMILY  - Updates: patient and wife updated at bedside. They wish him to be full code. - Inter-disciplinary family meet or Palliative Care meeting due by:  6/20  Critical care time- 35 mins.  Marshell Garfinkel MD [facility] Pager [phone] If no answer or after 3pm call: [phone] 07/27/2015, 12:22 PM

## 2015-07-27 NOTE — ED Notes (Signed)
MD at bedside. 

## 2015-07-27 NOTE — ED Notes (Signed)
Pt w/ hx of lung cancer.  Orthopnea during OP CT of head and chest.  Rapid response called but ER staff responded due to another rapid response called at same time.  ER staff found pt to be tachypneic and labored in breathing.  Pt denies being on blood thinners.  No hx of clot.

## 2015-07-27 NOTE — ED Notes (Signed)
PA and MD at bedside.

## 2015-07-27 NOTE — ED Notes (Signed)
Patient transported to X-ray 

## 2015-07-27 NOTE — Progress Notes (Signed)
Pt resting comfortably at this time on 3 LPM [place].  Pt in no noted distress, RT will hold BIPAP at this time.  RT to monitor and assess as needed.

## 2015-07-27 NOTE — ED Notes (Signed)
Respiratory made aware of Bipap

## 2015-07-27 NOTE — Progress Notes (Signed)
BIPAP set-up and ready to place on pt for distress. Dr. Vaughan Browner request to hold the BIPAP until after he does the thoracentesis. Pt currently on 4 lpm [place]. Bipap on standby. RT will continue to monitor.

## 2015-07-27 NOTE — ED Notes (Signed)
Respiratory notified of ABG. 

## 2015-07-27 NOTE — ED Notes (Signed)
Bed: OE32 Expected date:  Expected time:  Means of arrival:  Comments: EMS - [place] pt

## 2015-07-27 NOTE — ED Provider Notes (Signed)
CSN: [id number]     Arrival date & time 07/27/15  0855 History   First MD Initiated Contact with Patient 07/27/15 [phone]     Chief Complaint  Patient presents with  . Shortness of Breath     (Consider location/radiation/quality/duration/timing/severity/associated sxs/prior Treatment) HPI   Steven Roberts is a 50 y.o. male, with a history of Adenocarcinoma lung cancer with metastasis, pericardial effusion, and pulmonary hypertension presenting to the ED with shortness of breath, increasing over the last two day. Orthopnea and increased peripheral edema. Became orthopneic with drop in SPO2 while in CT for his pan-scan evaluation. Receiving oral chemo treatment for his lung cancer. Last radiation was December 2016. Accompanied by his wife, Marcela. Denies increased coughing, recent illness, fever/chills, nausea/vomiting, chest pain, or any other complaints. Dr. Earlie Server is the patient's oncologist. A thoracentesis was performed about a month ago with fluid drawn from the right lung.     Past Medical History  Diagnosis Date  . Bone cancer ([place]) 11/03/14 Pet scan    left 6th rib, right hip  . Lung cancer ([place]) 10/23/14    LUL adenocarcinoma, non-small cell  . Pericardial effusion with cardiac tamponade 12/11/2014  . Pulmonary infiltrates  c/w Adenoca/ Stage IV  10/13/2014    - symptom onset march 2016 - Baseline cxr on July 20 2014 reported to be nl  > then CT chest 10/13/2014 LUL as dz  Assoc with   LUL nodular density/ mild adenopathy - Quantiferon Gold 10/13/2014 > neg  - FOB 10/23/14 > cobblestoning beyond Lingular orifice with diffuse narrowing x 50% of LUL beyond lingula> adenoca - PET 11/03/14 1. The dominant left upper lobe mass and extensive adenopathy involving th  . Non-small cell carcinoma of lung, stage 4 ([place]) 11/12/2014  . Hypoalbuminemia due to protein-calorie malnutrition ([place]) 12/02/2014  . Hyponatremia 12/10/2014  . Hyperphosphatemia 12/02/2014  . Bone metastasis ([place]) 11/25/2014  . S/P  radiation therapy 12/07/14-01/18/15    left lung/lateral rib /rt hip  . Diabetes mellitus without complication ([place])   . Encounter for antineoplastic chemotherapy 03/09/2015  . Pulmonary hypertension [facility]): Per 2 d echo 06/03/2015 06/03/2015   Past Surgical History  Procedure Laterality Date  . Video bronchoscopy Bilateral 10/23/2014    Procedure: VIDEO BRONCHOSCOPY WITHOUT FLUORO;  Surgeon: Tanda Rockers, MD;  Location: [facility];  Service: Cardiopulmonary;  Laterality: Bilateral;  . Subxyphoid pericardial window N/A 12/11/2014    Procedure: SUBXYPHOID PERICARDIAL WINDOW;  Surgeon: Rexene Alberts, MD;  Location: [place];  Service: Thoracic;  Laterality: N/A;  . Tee without cardioversion N/A 12/11/2014    Procedure: TRANSESOPHAGEAL ECHOCARDIOGRAM (TEE);  Surgeon: Rexene Alberts, MD;  Location: [facility] OR;  Service: Thoracic;  Laterality: N/A;   Family History  Problem Relation Age of Onset  . Cancer Father     Prostate cancer  . Diabetes Mother    Social History  Substance Use Topics  . Smoking status: Never Smoker   . Smokeless tobacco: Never Used  . Alcohol Use: No    Review of Systems  Constitutional: Negative for fever and chills.  Respiratory: Positive for shortness of breath. Negative for cough.   Cardiovascular: Negative for chest pain.  Gastrointestinal: Negative for nausea, vomiting and abdominal pain.  Neurological: Negative for dizziness and light-headedness.  All other systems reviewed and are negative.     Allergies  Review of patient's allergies indicates no known allergies.  Home Medications   Prior to Admission medications   Medication Sig Start Date End  Date Taking? Authorizing Provider  HYDROcodone-acetaminophen (NORCO/VICODIN) 5-325 MG tablet Take 1-2 tablets by mouth every 6 (six) hours as needed for moderate pain. 07/22/15  Yes Owens Shark, NP  ibuprofen (ADVIL) 200 MG tablet Take 400 mg by mouth every 6 (six) hours as needed for mild pain. Reported on  03/09/2015   Yes Historical Provider, MD  osimertinib mesylate (TAGRISSO) 80 MG tablet Take 1 tablet (80 mg total) by mouth daily. 06/01/15  Yes Curt Bears, MD  albuterol (PROVENTIL) (2.5 MG/3ML) 0.083% nebulizer solution Take 3 mLs (2.5 mg total) by nebulization every 6 (six) hours as needed for wheezing or shortness of breath. Patient not taking: Reported on 07/08/2015 06/03/15   Eugenie Filler, MD  benzonatate (TESSALON) 200 MG capsule Take 1 capsule (200 mg total) by mouth 3 (three) times daily. Patient not taking: Reported on 07/08/2015 06/03/15   Eugenie Filler, MD  dexamethasone (DECADRON) 4 MG tablet 4 mg by mouth twice a day the day before, day of and day after the chemotherapy every 3 weeks Patient not taking: Reported on 06/10/2015 06/10/15   Curt Bears, MD  feeding supplement, ENSURE ENLIVE, (ENSURE ENLIVE) LIQD Take 237 mLs by mouth 2 (two) times daily between meals. Patient not taking: Reported on 07/08/2015 06/03/15   Eugenie Filler, MD  fentaNYL (DURAGESIC - DOSED MCG/HR) 50 MCG/HR Place 1 patch (50 mcg total) onto the skin every 3 (three) days. Patient not taking: Reported on 07/08/2015 06/03/15   Eugenie Filler, MD  Fluticasone-Salmeterol (ADVAIR) 250-50 MCG/DOSE AEPB Inhale 1 puff into the lungs 2 (two) times daily. Patient not taking: Reported on 07/08/2015 06/03/15   Eugenie Filler, MD  folic acid (FOLVITE) 1 MG tablet Take 1 tablet (1 mg total) by mouth daily. Patient not taking: Reported on 06/10/2015 06/10/15   Curt Bears, MD  glimepiride (AMARYL) 2 MG tablet Take 1 tablet (2 mg total) by mouth daily with breakfast. Patient not taking: Reported on 06/10/2015 06/03/15   Eugenie Filler, MD  HYDROcodone-homatropine [facility]) 5-1.5 MG/5ML syrup Take 5 mLs by mouth every 6 (six) hours as needed for cough. Patient not taking: Reported on 07/27/2015 06/25/15   Susanne Borders, NP  morphine (MS CONTIN) 15 MG 12 hr tablet Take 1 tablet (15 mg total) by mouth every 12  (twelve) hours. Patient not taking: Reported on 07/27/2015 07/01/15   Susanne Borders, NP  pantoprazole (PROTONIX) 40 MG tablet Take 1 tablet (40 mg total) by mouth daily. Patient not taking: Reported on 07/08/2015 06/03/15   Eugenie Filler, MD  tiotropium (SPIRIVA HANDIHALER) 18 MCG inhalation capsule Place 1 capsule (18 mcg total) into inhaler and inhale daily. Patient not taking: Reported on 07/08/2015 06/03/15   Eugenie Filler, MD   BP 134/90 mmHg  Pulse 111  Temp(Src) 98.5 F (36.9 C) (Oral)  Resp 22  SpO2 95% Physical Exam  Constitutional: He appears well-developed and well-nourished. No distress.  HENT:  Head: Normocephalic and atraumatic.  Eyes: Conjunctivae are normal.  Neck: Neck supple.  Cardiovascular: Normal rate, regular rhythm, normal heart sounds and intact distal pulses.   Pulmonary/Chest: Tachypnea noted. He is in respiratory distress. He has decreased breath sounds in the right middle field, the right lower field and the left lower field.  Diminished lung sounds in both bases, worse on the right.  Abdominal: Soft. There is no tenderness. There is no guarding.  Musculoskeletal: He exhibits no edema or tenderness.  Lymphadenopathy:    He has  no cervical adenopathy.  Neurological: He is alert.  Skin: Skin is warm and dry. He is not diaphoretic.  Psychiatric: He has a normal mood and affect. His behavior is normal.  Nursing note and vitals reviewed.   ED Course  Procedures (including critical care time)  CRITICAL CARE Performed by: Rondy Krupinski C Bernedette Auston Total critical care time: 35 minutes Critical care time was exclusive of separately billable procedures and treating other patients. Critical care was necessary to treat or prevent imminent or life-threatening deterioration. Critical care was time spent personally by me on the following activities: development of treatment plan with patient and/or surrogate as well as nursing, discussions with consultants, evaluation of  patient's response to treatment, examination of patient, obtaining history from patient or surrogate, ordering and performing treatments and interventions, ordering and review of laboratory studies, ordering and review of radiographic studies, pulse oximetry and re-evaluation of patient's condition.   Labs Review Labs Reviewed  CBC WITH DIFFERENTIAL/PLATELET - Abnormal; Notable for the following:    WBC 10.6 (*)    RBC 3.86 (*)    Hemoglobin 9.6 (*)    HCT 30.8 (*)    MCH 24.9 (*)    Platelets 496 (*)    Neutro Abs 9.2 (*)    All other components within normal limits  D-DIMER, QUANTITATIVE (NOT AT [facility]) - Abnormal; Notable for the following:    D-Dimer, Quant 5.14 (*)    All other components within normal limits  COMPREHENSIVE METABOLIC PANEL - Abnormal; Notable for the following:    Sodium 133 (*)    Chloride 93 (*)    CO2 34 (*)    Glucose, Bld 154 (*)    Albumin 2.5 (*)    Alkaline Phosphatase 134 (*)    All other components within normal limits  BLOOD GAS, ARTERIAL - Abnormal; Notable for the following:    pCO2 arterial 64.3 (*)    pO2, Arterial 75.4 (*)    Bicarbonate 35.2 (*)    Acid-Base Excess 8.6 (*)    All other components within normal limits  URINALYSIS, ROUTINE W REFLEX MICROSCOPIC (NOT AT [facility]) - Abnormal; Notable for the following:    Specific Gravity, Urine >1.046 (*)    Hgb urine dipstick LARGE (*)    All other components within normal limits  URINE MICROSCOPIC-ADD ON - Abnormal; Notable for the following:    Squamous Epithelial / LPF 0-5 (*)    Bacteria, UA RARE (*)    All other components within normal limits  BODY FLUID CULTURE  CULTURE, BLOOD (ROUTINE X 2)  CULTURE, BLOOD (ROUTINE X 2)  URINE CULTURE  CULTURE, RESPIRATORY (NON-EXPECTORATED)  MRSA PCR SCREENING  BRAIN NATRIURETIC PEPTIDE  LACTIC ACID, PLASMA  TROPONIN I  TROPONIN I  TROPONIN I  PROCALCITONIN  BRAIN NATRIURETIC PEPTIDE  CORTISOL  LACTATE DEHYDROGENASE, BODY FLUID  PROTEIN, BODY  FLUID  BODY FLUID CELL COUNT WITH DIFFERENTIAL  LACTATE DEHYDROGENASE  PROTEIN, TOTAL  I-STAT TROPOININ, ED  I-STAT CG4 LACTIC ACID, ED  CYTOLOGY - NON PAP    Imaging Review Dg Chest 2 View  07/27/2015  CLINICAL DATA:  History of lung carcinoma with increased shortness of breath EXAM: CHEST  2 VIEW COMPARISON:  Jul 01, 2015 FINDINGS: Right-sided pleural effusion which appears at least partially has increased in size. There is extensive airspace consolidation throughout the lungs bilaterally with relative confluence in the left perihilar region, stable. There is no new airspace consolidation. Heart is upper normal in size. Pulmonary vascularity appears unremarkable.  No adenopathy is evident by radiography. No bone lesions are evident by radiography. IMPRESSION: Widespread airspace consolidation bilaterally with confluence in the left perihilar region, stable. Increase in size of apparent partially loculated right pleural effusion anteriorly. Cardiac silhouette is overall stable. Electronically Signed   By: Lowella Grip III M.D.   On: 07/27/2015 09:33   Ct Head Wo Contrast  07/27/2015  CLINICAL DATA:  50 year old male with metastatic lung cancer. Fatigue, anorexia, pain, shortness of breath, cough. Osseous metastatic disease. EXAM: CT HEAD WITHOUT CONTRAST CT NECK WITH CONTRAST TECHNIQUE: Contiguous axial images were obtained from the base of the skull through the vertex without intravenous contrast Multidetector CT imaging of the and neck was performed using the standard protocol following the bolus administration of intravenous contrast. CONTRAST:  199m ISOVUE-300 IOPAMIDOL (ISOVUE-300) INJECTION 61% conjunction with contrast enhanced imaging of the chest, abdomen, and pelvis reported separately. The patient became progressively short of breath following the contrast-enhanced imaging from the neck to the pelvis. Rapid Response was called to the CT department as the patient's O2 level was 87%. He  was taken to the ED. Subsequently post-contrast head CT imaging could not be obtained. COMPARISON:  Head CT without contrast 12/11/2014. Brain MRI 11/19/2014 Chest CT 05/08/2015. FINDINGS: CT HEAD FINDINGS Stable and well pneumatized visualized paranasal sinuses and mastoids. No destructive or suspicious osseous lesion of the calvarium identified. Visualized orbits and scalp soft tissues are within normal limits. Cerebral volume is normal. No midline shift, ventriculomegaly, mass effect, evidence of mass lesion, intracranial hemorrhage or evidence of cortically based acute infarction. Gray-white matter differentiation is within normal limits throughout the brain. CT NECK FINDINGS Pharynx and larynx: Negative. Mild motion artifact. Negative parapharyngeal and retropharyngeal spaces. Salivary glands: Negative sublingual space, submandibular glands, and parotid glands. Thyroid: Stable and negative. Lymph nodes: Malignant appearing left level 4 lymph node conglomeration at the thoracic inlet appears mildly enlarged since 5 since 05/08/2015, currently encompassing 18 x 33 x 18 mm (AP by transverse by CC) versus 16 x 28 x 18 mm previously. Just superior to the main nodal conglomeration there are smaller but abnormally rounded individual nodes up to 9 mm short axis (series 8, image 73). These were not included on the prior chest, and are increased compared to the 11/03/2014 PET-CT (5-6 mm at that time). Bilateral level 2 and level 3 lymph nodes also appear larger than in 2016 measuring 7-10 mm short axis bilaterally. A 10 mm right level IIIa node on series 8, image 52 corresponds to the level of a right lateral neck skin marker. No cystic or necrotic nodes in the neck. Vascular: Major vascular structures in the neck and at the skullbase are patent. No significant carotid bifurcation atherosclerosis. Limited intracranial: Negative. Visualized orbits: Negative. Mastoids and visualized paranasal sinuses: Clear. Skeleton: No  acute or suspicious osseous lesion identified in the neck, although there is a 12 mm superior T1 vertebral body lytic lesion on the right which was sclerotic on 05/08/2015 (Series 8, image 74). Upper chest: Reported separately today. IMPRESSION: 1. Left thoracic inlet nodal metastasis appears slightly larger than on 05/08/2015, although the differences might be related to better visualization today given dedicated Neck imaging. Still, smaller but increased nearby and bilateral level II/III cervical nodes compared to the 11/03/2014 PET-CT do suggest progression of neck nodal metastases. 2. No other metastatic disease identified in the neck. 3. T1 vertebral bone lesion has become lytic since 05/08/2015 suggesting progression of bone metastases. See also CT Chest Abdomen and Pelvis  today reported separately. 4. Postcontrast head CT could not be performed. No evidence of metastatic disease to the brain on noncontrast head CT images. Electronically Signed   By: Genevie Ann M.D.   On: 07/27/2015 11:01   Ct Soft Tissue Neck W Contrast  07/27/2015  CLINICAL DATA:  50 year old male with metastatic lung cancer. Fatigue, anorexia, pain, shortness of breath, cough. Osseous metastatic disease. See also Contrast section below. EXAM: CT HEAD WITHOUT CONTRAST CT NECK WITH CONTRAST TECHNIQUE: Contiguous axial images were obtained from the base of the skull through the vertex without intravenous contrast Multidetector CT imaging of the and neck was performed using the standard protocol following the bolus administration of intravenous contrast. CONTRAST:  155m ISOVUE-300 IOPAMIDOL (ISOVUE-300) INJECTION 61% in conjunction with contrast enhanced imaging of the chest, abdomen, and pelvis reported separately. The patient became progressively short of breath following the contrast-enhanced imaging from the neck to the pelvis. Rapid Response was called to the CT department as the patient's O2 level was 87%. He was taken to the ED.  Subsequently post-contrast head CT imaging could not be obtained. COMPARISON:  Head CT without contrast 12/11/2014. Brain MRI 11/19/2014 Chest CT 05/08/2015. FINDINGS: CT HEAD FINDINGS Stable and well pneumatized visualized paranasal sinuses and mastoids. No destructive or suspicious osseous lesion of the calvarium identified. Visualized orbits and scalp soft tissues are within normal limits. Cerebral volume is normal. No midline shift, ventriculomegaly, mass effect, evidence of mass lesion, intracranial hemorrhage or evidence of cortically based acute infarction. Gray-white matter differentiation is within normal limits throughout the brain. CT NECK FINDINGS Pharynx and larynx: Negative. Mild motion artifact. Negative parapharyngeal and retropharyngeal spaces. Salivary glands: Negative sublingual space, submandibular glands, and parotid glands. Thyroid: Stable and negative. Lymph nodes: Malignant appearing left level 4 lymph node conglomeration at the thoracic inlet appears mildly enlarged since 5 since 05/08/2015, currently encompassing 18 x 33 x 18 mm (AP by transverse by CC) versus 16 x 28 x 18 mm previously. Just superior to the main nodal conglomeration there are smaller but abnormally rounded individual nodes up to 9 mm short axis (series 8, image 73). These were not included on the prior chest, and are increased compared to the 11/03/2014 PET-CT (5-6 mm at that time). Bilateral level 2 and level 3 lymph nodes also appear larger than in 2016 measuring 7-10 mm short axis bilaterally. A 10 mm right level IIIa node on series 8, image 52 corresponds to the level of a right lateral neck skin marker. No cystic or necrotic nodes in the neck. Vascular: Major vascular structures in the neck and at the skullbase are patent. No significant carotid bifurcation atherosclerosis. Limited intracranial: Negative. Visualized orbits: Negative. Mastoids and visualized paranasal sinuses: Clear. Skeleton: No acute or suspicious  osseous lesion identified in the neck, although there is a 12 mm superior T1 vertebral body lytic lesion on the right which was sclerotic on 05/08/2015 (Series 8, image 74). Upper chest: Reported separately today. IMPRESSION: 1. Left thoracic inlet nodal metastasis appears slightly larger than on 05/08/2015, although the differences might be related to better visualization today given dedicated Neck imaging. Still, smaller but increased nearby and bilateral level II/III cervical nodes compared to the 11/03/2014 PET-CT do suggest progression of neck nodal metastases. 2. No other metastatic disease identified in the neck. 3. T1 vertebral bone lesion has become lytic since 05/08/2015 suggesting progression of bone metastases. See also CT Chest Abdomen and Pelvis today reported separately. 4. Postcontrast head CT could not be performed.  No evidence of metastatic disease to the brain on noncontrast head CT images. Electronically Signed   By: Genevie Ann M.D.   On: 07/27/2015 09:35   Ct Chest W Contrast  07/27/2015  CLINICAL DATA:  Metastatic lung cancer. Osseous metastatic disease. Right pleural effusion. EXAM: CT CHEST, ABDOMEN, AND PELVIS WITH CONTRAST TECHNIQUE: Multidetector CT imaging of the chest, abdomen and pelvis was performed following the standard protocol during bolus administration of intravenous contrast. CONTRAST:  134m ISOVUE-300 IOPAMIDOL (ISOVUE-300) INJECTION 61% COMPARISON:  03/02/2015 FINDINGS: CT CHEST FINDINGS Mediastinum/Lymph Nodes: Normal heart size. New pericardial effusion identified. No axillary or supraclavicular adenopathy peer indistinct soft tissue stranding within the mediastinum is again identified without pathologically enlarged mediastinal lymph nodes. Lungs/Pleura: New large right pleural effusion which appears partially loculated. There is significantly diminished aeration to the right lung. Interval development of innumerable pulmonary nodular opacities throughout both lungs when  compared with the most recent staging CT from 03/02/2015. Large perihilar confluent opacities are new from previous exam compatible with significant interval progression of disease. There is evidence of index perihilar opacity involving the left lung measures 11.3 x 5.1 cm, image 68 of series 10. The index perihilar density within the right midlung measures 8.5 x 3.7 cm, image 69 of series 10. Interstitial thickening throughout the right upper lobe is new from previous exam and is worrisome for lymphangitic spread of tumor. Musculoskeletal: Multifocal mixed lytic and bone metastases are identified. New index lesion within the sternum measures 1.7 cm, image 85 of series 606. Progression of sclerotic bone metastasis is involving the T2 vertebra, image number 83 of series 606. New lytic lesion involving the superior endplate of TA63is identified, image 80 of series 606. CT ABDOMEN PELVIS FINDINGS Hepatobiliary: The index lesion within the medial segment of left lobe of liver measures 1.9 x 1.6 cm, image number 56 of series 5. Previously 2.9 x 2.4 cm. Mild gallbladder wall edema. Nonspecific. Lesion within the lateral segment of left lobe measures 1.6 cm, image 55 of series 5. Unchanged from previous exam. Pancreas: No mass, inflammatory changes, or other significant abnormality. Spleen: The spleen is unremarkable. Adrenals/Urinary Tract: The adrenal glands are normal. Bilateral striated nephrograms are noted involving both kidneys. The urinary bladder is unremarkable. Stomach/Bowel: The stomach is normal. The small bowel loops have a normal course and caliber. Vascular/Lymphatic: Aortic atherosclerosis. Interval development of multiple prominent retroperitoneal lymph nodes. Index node in the periaortic region measures 1 cm, image 73 of series 5. 9 mm periaortic lymph node is also new, image number 70 of series 5. No enlarged pelvic or inguinal lymph nodes. Reproductive: No mass or other significant abnormality. Other:  No ascites.  No peritoneal nodularity or mass identified. Musculoskeletal: New lytic lesion identified within the left iliac bone measures 11 mm, image 94 of series 5. Right iliac bone lytic lesion is also new measuring 3 cm, image 94 of series 5. Right iliac bone lytic lesion is new measuring 2.2 cm, image 100 of series 5. IMPRESSION: 1. Interval progression of disease. 2. New loculated right pleural effusion and new pericardial effusion. Suspect metastatic effusions. 3. Marked progression of pulmonary metastases. Suspect lymphangitic spread of tumor within the right upper lobe. 4. Interval progression of multifocal lytic and sclerotic bone metastases. 5. New upper abdominal retroperitoneal adenopathy 6. No significant change in the appearance of multifocal indeterminate liver lesions. 7. Striated nephrogram is noted involving both kidneys. This is a nonspecific finding but may be seen with vascular insult as well as  a pyelonephritis. Clinical correlation advise. Electronically Signed   By: Kerby Moors M.D.   On: 07/27/2015 09:45   Ct Angio Chest Pe W/cm &/or Wo Cm  07/27/2015  CLINICAL DATA:  History of metastatic lung carcinoma. Patient underwent CT imaging of head, neck, chest abdomen and pelvis this morning. Patient became very short of breath following the CT imaging this morning. EXAM: CT ANGIOGRAPHY CHEST WITH CONTRAST TECHNIQUE: Multidetector CT imaging of the chest was performed using the standard protocol during bolus administration of intravenous contrast. Multiplanar CT image reconstructions and MIPs were obtained to evaluate the vascular anatomy. CONTRAST:  100 mL of Isovue 370 intravenous contrast COMPARISON:  07/27/2015 FINDINGS: Angiographic study: There is no evidence of a pulmonary embolism. The great vessels normal in caliber. No aortic dissection plaque. Neck base and axilla: Prominent to mildly enlarged neck base lymph nodes, largest on the left measuring 12 mm short axis. Mediastinum and  hila: Heart normal size. Fxs new small moderate pericardial effusion stable the earlier CT. Ill-defined lymph nodes, most prominent subcarinal region are stable from prior CT. Abnormal soft tissue surrounds both hila consistent confluent metastatic adenopathy. Lungs and pleura: Numerous ill-defined pulmonary nodules, reticulonodular interstitial thickening, more confluent perihilar opacities and small to moderate right and minimal left pleural effusions are again noted consistent with extensive metastatic disease to the lungs including lymphangitic spread of carcinoma and probable malignant pleural effusions. This is stable from the earlier CT. Musculoskeletal: Mixed lytic and sclerotic bone lesions reflecting metastatic disease are again noted unchanged from the earlier CT. Review of the MIP images confirms the above findings. IMPRESSION: 1. No evidence of a pulmonary embolism. 2. No change from the CT scan obtained earlier this same date. 3. Significant progression of metastatic disease lung carcinoma as detailed above and in the previous CT report. Electronically Signed   By: Lajean Manes M.D.   On: 07/27/2015 13:18   Ct Abdomen Pelvis W Contrast  07/27/2015  CLINICAL DATA:  Metastatic lung cancer. Osseous metastatic disease. Right pleural effusion. EXAM: CT CHEST, ABDOMEN, AND PELVIS WITH CONTRAST TECHNIQUE: Multidetector CT imaging of the chest, abdomen and pelvis was performed following the standard protocol during bolus administration of intravenous contrast. CONTRAST:  173m ISOVUE-300 IOPAMIDOL (ISOVUE-300) INJECTION 61% COMPARISON:  03/02/2015 FINDINGS: CT CHEST FINDINGS Mediastinum/Lymph Nodes: Normal heart size. New pericardial effusion identified. No axillary or supraclavicular adenopathy peer indistinct soft tissue stranding within the mediastinum is again identified without pathologically enlarged mediastinal lymph nodes. Lungs/Pleura: New large right pleural effusion which appears partially  loculated. There is significantly diminished aeration to the right lung. Interval development of innumerable pulmonary nodular opacities throughout both lungs when compared with the most recent staging CT from 03/02/2015. Large perihilar confluent opacities are new from previous exam compatible with significant interval progression of disease. There is evidence of index perihilar opacity involving the left lung measures 11.3 x 5.1 cm, image 68 of series 10. The index perihilar density within the right midlung measures 8.5 x 3.7 cm, image 69 of series 10. Interstitial thickening throughout the right upper lobe is new from previous exam and is worrisome for lymphangitic spread of tumor. Musculoskeletal: Multifocal mixed lytic and bone metastases are identified. New index lesion within the sternum measures 1.7 cm, image 85 of series 606. Progression of sclerotic bone metastasis is involving the T2 vertebra, image number 83 of series 606. New lytic lesion involving the superior endplate of TJ85is identified, image 80 of series 606. CT ABDOMEN PELVIS FINDINGS Hepatobiliary: The  index lesion within the medial segment of left lobe of liver measures 1.9 x 1.6 cm, image number 56 of series 5. Previously 2.9 x 2.4 cm. Mild gallbladder wall edema. Nonspecific. Lesion within the lateral segment of left lobe measures 1.6 cm, image 55 of series 5. Unchanged from previous exam. Pancreas: No mass, inflammatory changes, or other significant abnormality. Spleen: The spleen is unremarkable. Adrenals/Urinary Tract: The adrenal glands are normal. Bilateral striated nephrograms are noted involving both kidneys. The urinary bladder is unremarkable. Stomach/Bowel: The stomach is normal. The small bowel loops have a normal course and caliber. Vascular/Lymphatic: Aortic atherosclerosis. Interval development of multiple prominent retroperitoneal lymph nodes. Index node in the periaortic region measures 1 cm, image 73 of series 5. 9 mm  periaortic lymph node is also new, image number 70 of series 5. No enlarged pelvic or inguinal lymph nodes. Reproductive: No mass or other significant abnormality. Other: No ascites.  No peritoneal nodularity or mass identified. Musculoskeletal: New lytic lesion identified within the left iliac bone measures 11 mm, image 94 of series 5. Right iliac bone lytic lesion is also new measuring 3 cm, image 94 of series 5. Right iliac bone lytic lesion is new measuring 2.2 cm, image 100 of series 5. IMPRESSION: 1. Interval progression of disease. 2. New loculated right pleural effusion and new pericardial effusion. Suspect metastatic effusions. 3. Marked progression of pulmonary metastases. Suspect lymphangitic spread of tumor within the right upper lobe. 4. Interval progression of multifocal lytic and sclerotic bone metastases. 5. New upper abdominal retroperitoneal adenopathy 6. No significant change in the appearance of multifocal indeterminate liver lesions. 7. Striated nephrogram is noted involving both kidneys. This is a nonspecific finding but may be seen with vascular insult as well as a pyelonephritis. Clinical correlation advise. Electronically Signed   By: Kerby Moors M.D.   On: 07/27/2015 09:45   Dg Chest Portable 1 View  07/27/2015  CLINICAL DATA:  Post thoracentesis.  Lung cancer EXAM: PORTABLE CHEST 1 VIEW COMPARISON:  07/27/2015 FINDINGS: Improvement in right pleural effusion. Small residual right effusion. Negative for pneumothorax Diffuse bilateral nodular densities post consistent with metastatic disease. This is slightly more prominent. Superimposed pneumonia or edema on the right cannot be excluded. IMPRESSION: Negative for pneumothorax post right thoracentesis Diffuse bilateral nodular densities compatible with metastatic disease. Progression of airspace disease on the right may represent pneumonia or edema. Electronically Signed   By: Franchot Gallo M.D.   On: 07/27/2015 12:05   I have  personally reviewed and evaluated these images and lab results as part of my medical decision-making.   EKG Interpretation   Date/Time:  Tuesday July 27 2015 09:05:38 EDT Ventricular Rate:  116 PR Interval:  162 QRS Duration: 82 QT Interval:  263 QTC Calculation: 365 R Axis:   111 Text Interpretation:  Sinus tachycardia Right axis deviation Probable  anteroseptal infarct, old Nonspecific T abnormalities, lateral leads Since  last tracing rate faster Confirmed by [facility]  MD, ELLIOTT [phone]) on  07/27/2015 9:51:30 AM       Medications  0.9 %  sodium chloride infusion (not administered)  0.9 %  sodium chloride infusion ( Intravenous Transfusing/Transfer 07/27/15 1309)  ipratropium-albuterol (DUONEB) 0.5-2.5 (3) MG/3ML nebulizer solution 3 mL (not administered)  methylPREDNISolone sodium succinate (SOLU-MEDROL) 40 mg/mL injection 40 mg (40 mg Intravenous Given 07/27/15 1227)  piperacillin-tazobactam (ZOSYN) IVPB 3.375 g (3.375 g Intravenous Transfusing/Transfer 07/27/15 1309)  piperacillin-tazobactam (ZOSYN) IVPB 3.375 g (not administered)  vancomycin (VANCOCIN) IVPB 1000 mg/200 mL premix (  1,000 mg Intravenous Transfusing/Transfer 07/27/15 1309)  vancomycin (VANCOCIN) IVPB 1000 mg/200 mL premix (not administered)  insulin aspart (novoLOG) injection 0-9 Units (not administered)  HYDROmorphone (DILAUDID) injection 0.5 mg (not administered)  iopamidol (ISOVUE-370) 76 % injection 100 mL (80 mLs Intravenous Contrast Given 07/27/15 1236)     Orders Placed This Encounter  Procedures  . Culture, blood (routine x 2)  . Urine culture  . Culture, respiratory (tracheal aspirate)  . Body fluid culture  . MRSA PCR Screening  . DG Chest 2 View  . DG Chest Portable 1 View  . CT Angio Chest PE W/Cm &/Or Wo Cm  . CT Head W Contrast  . Brain natriuretic peptide  . CBC with Differential  . D-dimer, quantitative  . Comprehensive metabolic panel  . Blood gas, arterial ([facility] & [facility] ONLY)  . Urinalysis,  Routine w reflex microscopic  . Troponin I  . Lactic acid, plasma  . Procalcitonin  . Brain natriuretic peptide  . Cortisol  . CBC  . Basic metabolic panel  . Lactate dehydrogenase (CSF, pleural or peritoneal fluid)  . Protein, pleural or peritoneal fluid  . Body fluid cell count with differential  . Lactate dehydrogenase  . Protein, total  . Urine microscopic-add on  . Diet NPO time specified  . Vital signs  . Cardiac monitoring  . Initiate eLink PCCM Adult ICU Electrolyte Replacement Protocol  . Weigh on admission and daily  . Strict intake and output  . ICU - CAM assessment  . Complete oral assessment and place order set for Oral Care Protocol  . SCDs  . Nursing communication  . STAT CBG when hypoglycemia is suspected. If treated, recheck every 15 minutes after each treatment until CBG >/= 70 mg/dl  . Refer to Hypoglycemia Protocol Sidebar Report for treatment of CBG < 70 mg/dl  . Full code  . Consult to interventional radiologist (physician to physician consult) [phone]  Urgent thoracentesis  . Consult to hospitalist  . Consult to intensivist  . Consult to oncology  . CCM pharmacy monitoring  . antibiotic (miscellaneous) per pharmacy consult  . vancomycin per pharmacy consult  . piperacillin-tazobactam (ZOSYN) per pharmacy consult  . Pulse oximetry (single)  . Bipap  . Pulse oximetry, continuous  . BiPAP  . Oxygen therapy Mode or (Route): Venti-mask; FiO2: 35%  . I-stat troponin, ED  . I-Stat CG4 Lactic Acid, ED  . ED EKG  . ECHO COMPLETE  . Insert peripheral IV  . Admit to Inpatient (patient's expected length of stay will be greater than 2 midnights or inpatient only procedure)    MDM   Final diagnoses:  Dyspnea  Pleural effusion on right    Davidmichael Zarazua presents with shortness of breath and orthopnea worsening over the last 2 days and acutely worse over the past hour.  Findings and plan of care discussed with Daleen Bo, MD. Dr. Eulis Foster personally  evaluated and examined this patient.  10:30 AM Spoke with [place] department, who advises that the patient should go ahead and get admitted because there are at least two patient's In front of him also needing thoracentesis. He was reiterated to them that this is an emergent issue, and I was told they would get the procedure performed as soon as possible. They suspect it will be at least an hour if not more. 10:39 AM Spoke with Dr. Marthenia Rolling, hospitalist, who requests we consult critical care for possible ICU admission.  10:46 AM Spoke with Dr. Murlean Iba, Intensivist, who stated  he would send someone from the team to evaluate the patient.  Thoracentesis performed by the intensivist at the bedside in the ED. Patient admitted to the ICU by the intensivist team.  Filed Vitals:   07/27/15 0915 07/27/15 0941 07/27/15 1000 07/27/15 1030  BP:  109/87 147/87 134/90  Pulse: 112 109 115 111  Temp:      TempSrc:      Resp: '22 22 25 22  '$ SpO2: 94% 93% 92% 95%   Filed Vitals:   07/27/15 1130 07/27/15 1200 07/27/15 1230 07/27/15 1305  BP: 136/91 141/89 141/89 149/88  Pulse: 111 116 118 112  Temp:      TempSrc:      Resp: 26 31 34 30  SpO2: 94% 98% 91% 96%     Lorayne Bender, PA-C 07/27/15 [place], PA-C 07/27/15 [place], MD 07/27/15 1549

## 2015-07-27 NOTE — ED Provider Notes (Signed)
Medications - No data to display  Patient Vitals for the past 24 hrs:  BP Temp Temp src Pulse Resp SpO2  07/27/15 0941 109/87 mmHg - - 109 22 93 %  07/27/15 0915 - - - 112 22 94 %  07/27/15 0910 - - - 117 23 94 %  07/27/15 0902 - - - 114 26 93 %  07/27/15 0859 161/95 mmHg 98.5 F (36.9 C) Oral 117 (!) 30 (!) 85 %     Face-to-face evaluation   History: Shortness of breath, worsening since yesterday, associated with lethargy. Shortness of breath, worse when supine. According to patient's wife. He uses oxygen at home, and is not getting relief from it. Today he was being imaged, with CT for evaluation of worsening lung cancer, stage IV. During the scan, he began to be very short of breath. He was transferred here urgently after evaluation by rapid response team  Physical exam: Slender, calm, cooperative, somewhat lethargic. Lungs with decreased air movement, right greater than left base. Scattered rhonchi but no wheezes. Mildly increased work of breathing. He appears chronically ill.  Medical screening examination/treatment/procedure(s) were conducted as a shared visit with non-physician practitioner(s) and myself.  I personally evaluated the patient during the encounter     10:08- BiPAP ordered. Request to IR for thoracentesis  11:25- intensive assistance here now seeing the patient, and plans on doing a bedside thoracentesis. He will admit the patient, for observation in the ICU. Currently the patient is not on BiPAP.  11:28- page to oncology for update, and consultation, as an inpatient. Spoke with Dr. Julien Nordmann, and he will following admission.  CRITICAL CARE Performed by: Richarda Blade Total critical care time: 35 minutes Critical care time was exclusive of separately billable procedures and treating other patients. Critical care was necessary to treat or prevent imminent or life-threatening deterioration. Critical care was time spent personally by me on the following  activities: development of treatment plan with patient and/or surrogate as well as nursing, discussions with consultants, evaluation of patient's response to treatment, examination of patient, obtaining history from patient or surrogate, ordering and performing treatments and interventions, ordering and review of laboratory studies, ordering and review of radiographic studies, pulse oximetry and re-evaluation of patient's condition.    11:29 AM Reevaluation with update and discussion. After initial assessment and treatment, an updated evaluation reveals sitting up, calm, comfortable. Jermie Hippe L    Dg Chest 2 View  07/27/2015  CLINICAL DATA:  History of lung carcinoma with increased shortness of breath EXAM: CHEST  2 VIEW COMPARISON:  Jul 01, 2015 FINDINGS: Right-sided pleural effusion which appears at least partially has increased in size. There is extensive airspace consolidation throughout the lungs bilaterally with relative confluence in the left perihilar region, stable. There is no new airspace consolidation. Heart is upper normal in size. Pulmonary vascularity appears unremarkable. No adenopathy is evident by radiography. No bone lesions are evident by radiography. IMPRESSION: Widespread airspace consolidation bilaterally with confluence in the left perihilar region, stable. Increase in size of apparent partially loculated right pleural effusion anteriorly. Cardiac silhouette is overall stable. Electronically Signed   By: Lowella Grip III M.D.   On: 07/27/2015 09:33   Ct Head Wo Contrast  07/27/2015  CLINICAL DATA:  50 year old male with metastatic lung cancer. Fatigue, anorexia, pain, shortness of breath, cough. Osseous metastatic disease. EXAM: CT HEAD WITHOUT CONTRAST CT NECK WITH CONTRAST TECHNIQUE: Contiguous axial images were obtained from the base of the skull through the vertex without intravenous contrast  Multidetector CT imaging of the and neck was performed using the standard  protocol following the bolus administration of intravenous contrast. CONTRAST:  125m ISOVUE-300 IOPAMIDOL (ISOVUE-300) INJECTION 61% conjunction with contrast enhanced imaging of the chest, abdomen, and pelvis reported separately. The patient became progressively short of breath following the contrast-enhanced imaging from the neck to the pelvis. Rapid Response was called to the CT department as the patient's O2 level was 87%. He was taken to the ED. Subsequently post-contrast head CT imaging could not be obtained. COMPARISON:  Head CT without contrast 12/11/2014. Brain MRI 11/19/2014 Chest CT 05/08/2015. FINDINGS: CT HEAD FINDINGS Stable and well pneumatized visualized paranasal sinuses and mastoids. No destructive or suspicious osseous lesion of the calvarium identified. Visualized orbits and scalp soft tissues are within normal limits. Cerebral volume is normal. No midline shift, ventriculomegaly, mass effect, evidence of mass lesion, intracranial hemorrhage or evidence of cortically based acute infarction. Gray-white matter differentiation is within normal limits throughout the brain. CT NECK FINDINGS Pharynx and larynx: Negative. Mild motion artifact. Negative parapharyngeal and retropharyngeal spaces. Salivary glands: Negative sublingual space, submandibular glands, and parotid glands. Thyroid: Stable and negative. Lymph nodes: Malignant appearing left level 4 lymph node conglomeration at the thoracic inlet appears mildly enlarged since 5 since 05/08/2015, currently encompassing 18 x 33 x 18 mm (AP by transverse by CC) versus 16 x 28 x 18 mm previously. Just superior to the main nodal conglomeration there are smaller but abnormally rounded individual nodes up to 9 mm short axis (series 8, image 73). These were not included on the prior chest, and are increased compared to the 11/03/2014 PET-CT (5-6 mm at that time). Bilateral level 2 and level 3 lymph nodes also appear larger than in 2016 measuring 7-10 mm  short axis bilaterally. A 10 mm right level IIIa node on series 8, image 52 corresponds to the level of a right lateral neck skin marker. No cystic or necrotic nodes in the neck. Vascular: Major vascular structures in the neck and at the skullbase are patent. No significant carotid bifurcation atherosclerosis. Limited intracranial: Negative. Visualized orbits: Negative. Mastoids and visualized paranasal sinuses: Clear. Skeleton: No acute or suspicious osseous lesion identified in the neck, although there is a 12 mm superior T1 vertebral body lytic lesion on the right which was sclerotic on 05/08/2015 (Series 8, image 74). Upper chest: Reported separately today. IMPRESSION: 1. Left thoracic inlet nodal metastasis appears slightly larger than on 05/08/2015, although the differences might be related to better visualization today given dedicated Neck imaging. Still, smaller but increased nearby and bilateral level II/III cervical nodes compared to the 11/03/2014 PET-CT do suggest progression of neck nodal metastases. 2. No other metastatic disease identified in the neck. 3. T1 vertebral bone lesion has become lytic since 05/08/2015 suggesting progression of bone metastases. See also CT Chest Abdomen and Pelvis today reported separately. 4. Postcontrast head CT could not be performed. No evidence of metastatic disease to the brain on noncontrast head CT images. Electronically Signed   By: HGenevie AnnM.D.   On: 07/27/2015 11:01   Ct Soft Tissue Neck W Contrast  07/27/2015  CLINICAL DATA:  50year old male with metastatic lung cancer. Fatigue, anorexia, pain, shortness of breath, cough. Osseous metastatic disease. See also Contrast section below. EXAM: CT HEAD WITHOUT CONTRAST CT NECK WITH CONTRAST TECHNIQUE: Contiguous axial images were obtained from the base of the skull through the vertex without intravenous contrast Multidetector CT imaging of the and neck was performed using the standard  protocol following the bolus  administration of intravenous contrast. CONTRAST:  185m ISOVUE-300 IOPAMIDOL (ISOVUE-300) INJECTION 61% in conjunction with contrast enhanced imaging of the chest, abdomen, and pelvis reported separately. The patient became progressively short of breath following the contrast-enhanced imaging from the neck to the pelvis. Rapid Response was called to the CT department as the patient's O2 level was 87%. He was taken to the ED. Subsequently post-contrast head CT imaging could not be obtained. COMPARISON:  Head CT without contrast 12/11/2014. Brain MRI 11/19/2014 Chest CT 05/08/2015. FINDINGS: CT HEAD FINDINGS Stable and well pneumatized visualized paranasal sinuses and mastoids. No destructive or suspicious osseous lesion of the calvarium identified. Visualized orbits and scalp soft tissues are within normal limits. Cerebral volume is normal. No midline shift, ventriculomegaly, mass effect, evidence of mass lesion, intracranial hemorrhage or evidence of cortically based acute infarction. Gray-white matter differentiation is within normal limits throughout the brain. CT NECK FINDINGS Pharynx and larynx: Negative. Mild motion artifact. Negative parapharyngeal and retropharyngeal spaces. Salivary glands: Negative sublingual space, submandibular glands, and parotid glands. Thyroid: Stable and negative. Lymph nodes: Malignant appearing left level 4 lymph node conglomeration at the thoracic inlet appears mildly enlarged since 5 since 05/08/2015, currently encompassing 18 x 33 x 18 mm (AP by transverse by CC) versus 16 x 28 x 18 mm previously. Just superior to the main nodal conglomeration there are smaller but abnormally rounded individual nodes up to 9 mm short axis (series 8, image 73). These were not included on the prior chest, and are increased compared to the 11/03/2014 PET-CT (5-6 mm at that time). Bilateral level 2 and level 3 lymph nodes also appear larger than in 2016 measuring 7-10 mm short axis bilaterally. A 10  mm right level IIIa node on series 8, image 52 corresponds to the level of a right lateral neck skin marker. No cystic or necrotic nodes in the neck. Vascular: Major vascular structures in the neck and at the skullbase are patent. No significant carotid bifurcation atherosclerosis. Limited intracranial: Negative. Visualized orbits: Negative. Mastoids and visualized paranasal sinuses: Clear. Skeleton: No acute or suspicious osseous lesion identified in the neck, although there is a 12 mm superior T1 vertebral body lytic lesion on the right which was sclerotic on 05/08/2015 (Series 8, image 74). Upper chest: Reported separately today. IMPRESSION: 1. Left thoracic inlet nodal metastasis appears slightly larger than on 05/08/2015, although the differences might be related to better visualization today given dedicated Neck imaging. Still, smaller but increased nearby and bilateral level II/III cervical nodes compared to the 11/03/2014 PET-CT do suggest progression of neck nodal metastases. 2. No other metastatic disease identified in the neck. 3. T1 vertebral bone lesion has become lytic since 05/08/2015 suggesting progression of bone metastases. See also CT Chest Abdomen and Pelvis today reported separately. 4. Postcontrast head CT could not be performed. No evidence of metastatic disease to the brain on noncontrast head CT images. Electronically Signed   By: HGenevie AnnM.D.   On: 07/27/2015 09:35   Ct Chest W Contrast  07/27/2015  CLINICAL DATA:  Metastatic lung cancer. Osseous metastatic disease. Right pleural effusion. EXAM: CT CHEST, ABDOMEN, AND PELVIS WITH CONTRAST TECHNIQUE: Multidetector CT imaging of the chest, abdomen and pelvis was performed following the standard protocol during bolus administration of intravenous contrast. CONTRAST:  1082mISOVUE-300 IOPAMIDOL (ISOVUE-300) INJECTION 61% COMPARISON:  03/02/2015 FINDINGS: CT CHEST FINDINGS Mediastinum/Lymph Nodes: Normal heart size. New pericardial effusion  identified. No axillary or supraclavicular adenopathy peer indistinct soft tissue  stranding within the mediastinum is again identified without pathologically enlarged mediastinal lymph nodes. Lungs/Pleura: New large right pleural effusion which appears partially loculated. There is significantly diminished aeration to the right lung. Interval development of innumerable pulmonary nodular opacities throughout both lungs when compared with the most recent staging CT from 03/02/2015. Large perihilar confluent opacities are new from previous exam compatible with significant interval progression of disease. There is evidence of index perihilar opacity involving the left lung measures 11.3 x 5.1 cm, image 68 of series 10. The index perihilar density within the right midlung measures 8.5 x 3.7 cm, image 69 of series 10. Interstitial thickening throughout the right upper lobe is new from previous exam and is worrisome for lymphangitic spread of tumor. Musculoskeletal: Multifocal mixed lytic and bone metastases are identified. New index lesion within the sternum measures 1.7 cm, image 85 of series 606. Progression of sclerotic bone metastasis is involving the T2 vertebra, image number 83 of series 606. New lytic lesion involving the superior endplate of Z61 is identified, image 80 of series 606. CT ABDOMEN PELVIS FINDINGS Hepatobiliary: The index lesion within the medial segment of left lobe of liver measures 1.9 x 1.6 cm, image number 56 of series 5. Previously 2.9 x 2.4 cm. Mild gallbladder wall edema. Nonspecific. Lesion within the lateral segment of left lobe measures 1.6 cm, image 55 of series 5. Unchanged from previous exam. Pancreas: No mass, inflammatory changes, or other significant abnormality. Spleen: The spleen is unremarkable. Adrenals/Urinary Tract: The adrenal glands are normal. Bilateral striated nephrograms are noted involving both kidneys. The urinary bladder is unremarkable. Stomach/Bowel: The stomach is  normal. The small bowel loops have a normal course and caliber. Vascular/Lymphatic: Aortic atherosclerosis. Interval development of multiple prominent retroperitoneal lymph nodes. Index node in the periaortic region measures 1 cm, image 73 of series 5. 9 mm periaortic lymph node is also new, image number 70 of series 5. No enlarged pelvic or inguinal lymph nodes. Reproductive: No mass or other significant abnormality. Other: No ascites.  No peritoneal nodularity or mass identified. Musculoskeletal: New lytic lesion identified within the left iliac bone measures 11 mm, image 94 of series 5. Right iliac bone lytic lesion is also new measuring 3 cm, image 94 of series 5. Right iliac bone lytic lesion is new measuring 2.2 cm, image 100 of series 5. IMPRESSION: 1. Interval progression of disease. 2. New loculated right pleural effusion and new pericardial effusion. Suspect metastatic effusions. 3. Marked progression of pulmonary metastases. Suspect lymphangitic spread of tumor within the right upper lobe. 4. Interval progression of multifocal lytic and sclerotic bone metastases. 5. New upper abdominal retroperitoneal adenopathy 6. No significant change in the appearance of multifocal indeterminate liver lesions. 7. Striated nephrogram is noted involving both kidneys. This is a nonspecific finding but may be seen with vascular insult as well as a pyelonephritis. Clinical correlation advise. Electronically Signed   By: Kerby Moors M.D.   On: 07/27/2015 09:45   Ct Abdomen Pelvis W Contrast  07/27/2015  CLINICAL DATA:  Metastatic lung cancer. Osseous metastatic disease. Right pleural effusion. EXAM: CT CHEST, ABDOMEN, AND PELVIS WITH CONTRAST TECHNIQUE: Multidetector CT imaging of the chest, abdomen and pelvis was performed following the standard protocol during bolus administration of intravenous contrast. CONTRAST:  121m ISOVUE-300 IOPAMIDOL (ISOVUE-300) INJECTION 61% COMPARISON:  03/02/2015 FINDINGS: CT CHEST  FINDINGS Mediastinum/Lymph Nodes: Normal heart size. New pericardial effusion identified. No axillary or supraclavicular adenopathy peer indistinct soft tissue stranding within the mediastinum is again identified  without pathologically enlarged mediastinal lymph nodes. Lungs/Pleura: New large right pleural effusion which appears partially loculated. There is significantly diminished aeration to the right lung. Interval development of innumerable pulmonary nodular opacities throughout both lungs when compared with the most recent staging CT from 03/02/2015. Large perihilar confluent opacities are new from previous exam compatible with significant interval progression of disease. There is evidence of index perihilar opacity involving the left lung measures 11.3 x 5.1 cm, image 68 of series 10. The index perihilar density within the right midlung measures 8.5 x 3.7 cm, image 69 of series 10. Interstitial thickening throughout the right upper lobe is new from previous exam and is worrisome for lymphangitic spread of tumor. Musculoskeletal: Multifocal mixed lytic and bone metastases are identified. New index lesion within the sternum measures 1.7 cm, image 85 of series 606. Progression of sclerotic bone metastasis is involving the T2 vertebra, image number 83 of series 606. New lytic lesion involving the superior endplate of S16 is identified, image 80 of series 606. CT ABDOMEN PELVIS FINDINGS Hepatobiliary: The index lesion within the medial segment of left lobe of liver measures 1.9 x 1.6 cm, image number 56 of series 5. Previously 2.9 x 2.4 cm. Mild gallbladder wall edema. Nonspecific. Lesion within the lateral segment of left lobe measures 1.6 cm, image 55 of series 5. Unchanged from previous exam. Pancreas: No mass, inflammatory changes, or other significant abnormality. Spleen: The spleen is unremarkable. Adrenals/Urinary Tract: The adrenal glands are normal. Bilateral striated nephrograms are noted involving both  kidneys. The urinary bladder is unremarkable. Stomach/Bowel: The stomach is normal. The small bowel loops have a normal course and caliber. Vascular/Lymphatic: Aortic atherosclerosis. Interval development of multiple prominent retroperitoneal lymph nodes. Index node in the periaortic region measures 1 cm, image 73 of series 5. 9 mm periaortic lymph node is also new, image number 70 of series 5. No enlarged pelvic or inguinal lymph nodes. Reproductive: No mass or other significant abnormality. Other: No ascites.  No peritoneal nodularity or mass identified. Musculoskeletal: New lytic lesion identified within the left iliac bone measures 11 mm, image 94 of series 5. Right iliac bone lytic lesion is also new measuring 3 cm, image 94 of series 5. Right iliac bone lytic lesion is new measuring 2.2 cm, image 100 of series 5. IMPRESSION: 1. Interval progression of disease. 2. New loculated right pleural effusion and new pericardial effusion. Suspect metastatic effusions. 3. Marked progression of pulmonary metastases. Suspect lymphangitic spread of tumor within the right upper lobe. 4. Interval progression of multifocal lytic and sclerotic bone metastases. 5. New upper abdominal retroperitoneal adenopathy 6. No significant change in the appearance of multifocal indeterminate liver lesions. 7. Striated nephrogram is noted involving both kidneys. This is a nonspecific finding but may be seen with vascular insult as well as a pyelonephritis. Clinical correlation advise. Electronically Signed   By: Kerby Moors M.D.   On: 07/27/2015 09:45    Daleen Bo, MD 07/27/15 1555

## 2015-07-27 NOTE — Procedures (Signed)
Thoracentesis Procedure Note  Pre-operative Diagnosis: Rt effusion  Post-operative Diagnosis: same  Indications: Resp failure  Procedure Details  Consent: Informed consent was obtained. Risks of the procedure were discussed including: infection, bleeding, pain, pneumothorax.  Under sterile conditions the patient was positioned. Betadine solution and sterile drapes were utilized.  1% buffered lidocaine was used to anesthetize the rib space. Fluid was obtained without any difficulties and minimal blood loss.  A dressing was applied to the wound and wound care instructions were provided.   Findings 1500 ml of blood tinged pleural fluid was obtained. A sample was sent to Pathology for cytogenetics, flow, and cell counts, as well as for infection analysis.  Complications:  None; patient tolerated the procedure well.          Condition: stable  Plan A follow up chest x-ray was ordered.  Attending Attestation: I performed the procedure.  Marshell Garfinkel MD [facility] Pager [phone] If no answer or after 3pm call: [phone] 07/27/2015, 12:29 PM

## 2015-07-27 NOTE — ED Notes (Signed)
Patient made aware urine sample is needed. Urinal placed at bedside.

## 2015-07-27 NOTE — Progress Notes (Signed)
Pharmacy Antibiotic Note  Steven Roberts is a 50 y.o. 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  Pharmacy has been consulted for Vancomycin and Zosyn dosing for empiric coverage of HCAP.  Plan:  Vancomycin 1g IV q8h.  Plan for Vancomycin trough level at steady state. Goal trough level 15-20 mcg/mL.  Zosyn 3.375g IV x 1 over 30 minutes STAT, then Zosyn 3.375g IV q8h (infuse over 4 hours).  Monitor renal function, cultures, clinical course.     Temp (24hrs), Avg:98.5 F (36.9 C), Min:98.5 F (36.9 C), Max:98.5 F (36.9 C)   Recent Labs Lab 07/22/15 0915 07/27/15 0905 07/27/15 0917  WBC 7.7 10.6*  --   CREATININE 0.7 0.67  --   LATICACIDVEN  --   --  0.98    Estimated Creatinine Clearance: 106.7 mL/min (by C-G formula based on Cr of 0.67).    No Known Allergies  Antimicrobials this admission: 6/13 >> Vancomycin >> 6/13 >> Zosyn >>  Dose adjustments this admission: --  Microbiology results: 6/13 BCx: sent 6/13 UCx: sent  6/13 Sputum: sent  6/13 Pleural fluid Cx: sent  Thank you for allowing pharmacy to be a part of this patient's care.   Lindell Spar, PharmD, BCPS Pager: [phone] 07/27/2015 12:21 PM

## 2015-07-28 DIAGNOSIS — J948 Other specified pleural conditions: Secondary | ICD-10-CM

## 2015-07-28 DIAGNOSIS — R0600 Dyspnea, unspecified: Secondary | ICD-10-CM | POA: Insufficient documentation

## 2015-07-28 DIAGNOSIS — Z515 Encounter for palliative care: Secondary | ICD-10-CM | POA: Insufficient documentation

## 2015-07-28 DIAGNOSIS — C3490 Malignant neoplasm of unspecified part of unspecified bronchus or lung: Secondary | ICD-10-CM | POA: Insufficient documentation

## 2015-07-28 DIAGNOSIS — Z7189 Other specified counseling: Secondary | ICD-10-CM | POA: Insufficient documentation

## 2015-07-28 LAB — CBC
HCT: 29.9 % — ABNORMAL LOW (ref 39.0–52.0)
HEMOGLOBIN: 9.2 g/dL — AB (ref 13.0–17.0)
MCH: 25.4 pg — ABNORMAL LOW (ref 26.0–34.0)
MCHC: 30.8 g/dL (ref 30.0–36.0)
MCV: 82.6 fL (ref 78.0–100.0)
Platelets: 423 10*3/uL — ABNORMAL HIGH (ref 150–400)
RBC: 3.62 MIL/uL — ABNORMAL LOW (ref 4.22–5.81)
RDW: 15.2 % (ref 11.5–15.5)
WBC: 7.6 10*3/uL (ref 4.0–10.5)

## 2015-07-28 LAB — TROPONIN I: TROPONIN I: 0.04 ng/mL — AB (ref <0.031)

## 2015-07-28 LAB — GLUCOSE, CAPILLARY
GLUCOSE-CAPILLARY: 141 mg/dL — AB (ref 65–99)
GLUCOSE-CAPILLARY: 141 mg/dL — AB (ref 65–99)
GLUCOSE-CAPILLARY: 252 mg/dL — AB (ref 65–99)
GLUCOSE-CAPILLARY: 232 mg/dL — AB (ref 65–99)
GLUCOSE-CAPILLARY: 224 mg/dL — AB (ref 65–99)
Glucose-Capillary: 178 mg/dL — ABNORMAL HIGH (ref 65–99)

## 2015-07-28 LAB — URINE CULTURE

## 2015-07-28 LAB — BASIC METABOLIC PANEL
ANION GAP: 5 (ref 5–15)
BUN: 18 mg/dL (ref 6–20)
CHLORIDE: 96 mmol/L — AB (ref 101–111)
CO2: 36 mmol/L — ABNORMAL HIGH (ref 22–32)
CREATININE: 0.55 mg/dL — AB (ref 0.61–1.24)
Calcium: 8.8 mg/dL — ABNORMAL LOW (ref 8.9–10.3)
Glucose, Bld: 156 mg/dL — ABNORMAL HIGH (ref 65–99)
Potassium: 4.2 mmol/L (ref 3.5–5.1)
SODIUM: 137 mmol/L (ref 135–145)

## 2015-07-28 LAB — LACTATE DEHYDROGENASE, PLEURAL OR PERITONEAL FLUID: LD, Fluid: 169 U/L — ABNORMAL HIGH (ref 3–23)

## 2015-07-28 MED ORDER — IPRATROPIUM-ALBUTEROL 0.5-2.5 (3) MG/3ML IN SOLN: 3.0000 mL | RESPIRATORY_TRACT | Status: DC | PRN

## 2015-07-28 MED ORDER — HYDROCODONE-HOMATROPINE 5-1.5 MG/5ML PO SYRP
5.0000 mL | ORAL_SOLUTION | ORAL | Status: DC | PRN
  Administered 2015-07-28 – 2015-07-29 (×4): 5 mL via ORAL
  Filled 2015-07-28 (×4): qty 5

## 2015-07-28 MED ORDER — HYDROMORPHONE HCL 1 MG/ML IJ SOLN
0.5000 mg | INTRAMUSCULAR | Status: DC | PRN
  Administered 2015-07-29 – 2015-07-30 (×2): 0.5 mg via INTRAVENOUS
  Filled 2015-07-28 (×2): qty 1

## 2015-07-28 MED ORDER — TIOTROPIUM BROMIDE MONOHYDRATE 18 MCG IN CAPS
18.0000 ug | ORAL_CAPSULE | Freq: Every day | RESPIRATORY_TRACT | Status: DC
  Filled 2015-07-28 (×2): qty 5

## 2015-07-28 MED ORDER — GLIMEPIRIDE 2 MG PO TABS
2.0000 mg | ORAL_TABLET | Freq: Every day | ORAL | Status: DC
  Filled 2015-07-28 (×3): qty 1

## 2015-07-28 MED ORDER — METHYLPREDNISOLONE SODIUM SUCC 40 MG IJ SOLR
40.0000 mg | Freq: Two times a day (BID) | INTRAMUSCULAR | Status: DC
  Administered 2015-07-28 – 2015-07-29 (×3): 40 mg via INTRAVENOUS
  Filled 2015-07-28 (×3): qty 1

## 2015-07-28 MED ORDER — VITAMINS A & D EX OINT
TOPICAL_OINTMENT | CUTANEOUS | Status: AC
  Administered 2015-07-28: 16:00:00
  Filled 2015-07-28: qty 5

## 2015-07-28 MED ORDER — MORPHINE SULFATE (CONCENTRATE) 10 MG/0.5ML PO SOLN
5.0000 mg | ORAL | Status: DC | PRN
  Administered 2015-07-28 – 2015-07-29 (×5): 5 mg via ORAL
  Filled 2015-07-28 (×5): qty 0.5

## 2015-07-28 MED ORDER — MOMETASONE FURO-FORMOTEROL FUM 200-5 MCG/ACT IN AERO
2.0000 | INHALATION_SPRAY | Freq: Two times a day (BID) | RESPIRATORY_TRACT | Status: DC
  Administered 2015-07-29: 2 via RESPIRATORY_TRACT
  Filled 2015-07-28 (×2): qty 8.8

## 2015-07-28 NOTE — Consult Note (Signed)
Consultation Note Date: 07/28/2015   Patient Name: Steven Roberts  DOB: Jun 25, 1965  MRN: [id number]  Age / Sex: 50 y.o., male  PCP: [facility] Referring Physician: Marshell Garfinkel, MD  Reason for Consultation: Establishing goals of care  HPI/Patient Profile: 50 y.o. 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 He has a history of pericardial effusion with cardiac tamponade, right pleural effusion status post pericardial window.  He has EGFR positive mutation. His disease progressed on Tarceva and then was placed on Tagrisso for T790M resistant mutation  (started 06/11/15).  He underwent a restaging CT at the time of admission. Rapid response called for acute dyspnea, hypoxia. CT images with progression of lung cancer, worsening right effusion, pneumonitis. Patient underwent thoracentesis. Patient is in the ICU. He has been seen and evaluated by his oncologist. It is noted that with significant disease progression, declining functional status, no further systemic chemotherapy is deemed appropriate, hence consideration to be given for hospice approach, palliative consulted.   Patient is resting in bed, he is awake alert. He is breathing comfortably, he states he rested well overnight. His wife Steven Roberts and adult son are present at the bedside.   I introduced palliative care as follows: Palliative medicine is specialized medical care for people living with serious illness. It focuses on providing relief from the symptoms and stress of a serious illness. The goal is to improve quality of life for both the patient and the family.  Discussed with patient, wife and son. Also discussed with Dr Vaughan Browner. Chart reviewed. ECHO results discussed. Patient to be transferred out of the ICU  today. Discussed about appropriate symptom management of pain and dyspnea. Goals, values and wishes elicited. Patient elects DNR DNI. Wife is familiar with [facility], they live in [place], [place]. Discussed about hospice philosophy and how it can help [place] at this stage. Offered active listening and supportive care. All questions answered to the best of my ability. See recommendations below, thank you for the consult.   HCPOA  wife Steven Roberts is next of kin. He had 2 sons, one is deceased, adult son by the bedside this morning.   SUMMARY OF RECOMMENDATIONS    Code status now established as DNR DNI Home with hospice on d/c, lives in [place], [place]. Wife Risk analyst [facility] and asking for referral. Symptom management of pain and dyspnea: dilaudid IV PRN, add low dose Morphine PO PRN. Patient's med list states both MS Contin as well as Trans dermal fentanyl but he was not using either.  Continue to assess for PleurX catheter need and placement, discussed with Dr Vaughan Browner, appreciate input.  Goals shifting towards full comfort   Code Status/Advance Care Planning:  DNR    Symptom Management:    Dilaudid IV PRN, add low dose Roxanol. Continue other medications.   Palliative Prophylaxis:   Bowel  Regimen  Psycho-social/Spiritual:   Desire for further Chaplaincy support:no  Additional Recommendations: Education on Hospice  Prognosis:   < 3 months  Discharge Planning: Home with Hospice      Primary Diagnoses: Present on Admission:  . Acute respiratory failure ([place])  I have reviewed the medical record, interviewed the patient and family, and examined the patient. The following aspects are pertinent.  Past Medical History  Diagnosis Date  . Bone cancer ([place]) 11/03/14 Pet scan    left 6th rib, right hip  . Lung cancer ([place]) 10/23/14    LUL adenocarcinoma, non-small cell  . Pericardial effusion with cardiac tamponade 12/11/2014  . Pulmonary  infiltrates  c/w Adenoca/ Stage IV  10/13/2014    - symptom onset march 2016 - Baseline cxr on July 20 2014 reported to be nl  > then CT chest 10/13/2014 LUL as dz  Assoc with   LUL nodular density/ mild adenopathy - Quantiferon Gold 10/13/2014 > neg  - FOB 10/23/14 > cobblestoning beyond Lingular orifice with diffuse narrowing x 50% of LUL beyond lingula> adenoca - PET 11/03/14 1. The dominant left upper lobe mass and extensive adenopathy involving th  . Non-small cell carcinoma of lung, stage 4 ([place]) 11/12/2014  . Hypoalbuminemia due to protein-calorie malnutrition ([place]) 12/02/2014  . Hyponatremia 12/10/2014  . Hyperphosphatemia 12/02/2014  . Bone metastasis ([place]) 11/25/2014  . S/P radiation therapy 12/07/14-01/18/15    left lung/lateral rib /rt hip  . Diabetes mellitus without complication ([place])   . Encounter for antineoplastic chemotherapy 03/09/2015  . Pulmonary hypertension [facility]): Per 2 d echo 06/03/2015 06/03/2015   Social History   Social History  . Marital Status: Married    Spouse Name: N/A  . Number of Children: 1  . Years of Education: N/A   Occupational History  . [employer]    Social History Main Topics  . Smoking status: Never Smoker   . Smokeless tobacco: Never Used  . Alcohol Use: No  . Drug Use: No  . Sexual Activity: No   Other Topics Concern  . None   Social History Narrative   Lives with his wife.  Independent of ADLs.   Family History  Problem Relation Age of Onset  . Cancer Father     Prostate cancer  . Diabetes Mother    Scheduled Meds: . antiseptic oral rinse  7 mL Mouth Rinse q12n4p  . chlorhexidine  15 mL Mouth Rinse BID  . enoxaparin (LOVENOX) injection  40 mg Subcutaneous Q24H  . [START ON 07/29/2015] glimepiride  2 mg Oral Q breakfast  . insulin aspart  0-9 Units Subcutaneous Q4H  . methylPREDNISolone (SOLU-MEDROL) injection  40 mg Intravenous Q12H  . mometasone-formoterol  2 puff Inhalation BID  . tiotropium  18 mcg Inhalation Daily   Continuous  Infusions:  PRN Meds:.sodium chloride, HYDROcodone-homatropine, HYDROmorphone (DILAUDID) injection, ipratropium-albuterol, morphine CONCENTRATE, vitamin A & D Medications Prior to Admission:  Prior to Admission medications   Medication Sig Start Date End Date Taking? Authorizing Provider  HYDROcodone-acetaminophen (NORCO/VICODIN) 5-325 MG tablet Take 1-2 tablets by mouth every 6 (six) hours as needed for moderate pain. 07/22/15  Yes Owens Shark, NP  ibuprofen (ADVIL) 200 MG tablet Take 400 mg by mouth every 6 (six) hours as needed for mild pain. Reported on 03/09/2015   Yes Historical Provider, MD  osimertinib mesylate (TAGRISSO) 80 MG tablet Take 1 tablet (80 mg total) by mouth daily. 06/01/15  Yes Curt Bears, MD  albuterol (PROVENTIL) (2.5 MG/3ML) 0.083% nebulizer  solution Take 3 mLs (2.5 mg total) by nebulization every 6 (six) hours as needed for wheezing or shortness of breath. Patient not taking: Reported on 07/08/2015 06/03/15   Eugenie Filler, MD  benzonatate (TESSALON) 200 MG capsule Take 1 capsule (200 mg total) by mouth 3 (three) times daily. Patient not taking: Reported on 07/08/2015 06/03/15   Eugenie Filler, MD  dexamethasone (DECADRON) 4 MG tablet 4 mg by mouth twice a day the day before, day of and day after the chemotherapy every 3 weeks Patient not taking: Reported on 06/10/2015 06/10/15   Curt Bears, MD  feeding supplement, ENSURE ENLIVE, (ENSURE ENLIVE) LIQD Take 237 mLs by mouth 2 (two) times daily between meals. Patient not taking: Reported on 07/08/2015 06/03/15   Eugenie Filler, MD  fentaNYL (DURAGESIC - DOSED MCG/HR) 50 MCG/HR Place 1 patch (50 mcg total) onto the skin every 3 (three) days. Patient not taking: Reported on 07/08/2015 06/03/15   Eugenie Filler, MD  Fluticasone-Salmeterol (ADVAIR) 250-50 MCG/DOSE AEPB Inhale 1 puff into the lungs 2 (two) times daily. Patient not taking: Reported on 07/08/2015 06/03/15   Eugenie Filler, MD  folic acid (FOLVITE) 1 MG  tablet Take 1 tablet (1 mg total) by mouth daily. Patient not taking: Reported on 06/10/2015 06/10/15   Curt Bears, MD  glimepiride (AMARYL) 2 MG tablet Take 1 tablet (2 mg total) by mouth daily with breakfast. Patient not taking: Reported on 06/10/2015 06/03/15   Eugenie Filler, MD  HYDROcodone-homatropine [facility]) 5-1.5 MG/5ML syrup Take 5 mLs by mouth every 6 (six) hours as needed for cough. Patient not taking: Reported on 07/27/2015 06/25/15   Susanne Borders, NP  morphine (MS CONTIN) 15 MG 12 hr tablet Take 1 tablet (15 mg total) by mouth every 12 (twelve) hours. Patient not taking: Reported on 07/27/2015 07/01/15   Susanne Borders, NP  pantoprazole (PROTONIX) 40 MG tablet Take 1 tablet (40 mg total) by mouth daily. Patient not taking: Reported on 07/08/2015 06/03/15   Eugenie Filler, MD  tiotropium (SPIRIVA HANDIHALER) 18 MCG inhalation capsule Place 1 capsule (18 mcg total) into inhaler and inhale daily. Patient not taking: Reported on 07/08/2015 06/03/15   Eugenie Filler, MD   No Known Allergies Review of Systems + for episodic dyspnea, generalized pain  Physical Exam Weak young hispanic gentleman resting in bed Asking about breakfast NAD Appears chronically ill S1 S2 Shallow breathing, diminished bases Abdomen soft No edema Muscle wasting from cancer cachexia  Non focal   Vital Signs: BP 145/75 mmHg  Pulse 117  Temp(Src) 98.5 F (36.9 C) (Oral)  Resp 26  Ht 5' 7"  (1.702 m)  Wt 63.3 kg (139 lb 8.8 oz)  BMI 21.85 kg/m2  SpO2 97% Pain Assessment: 0-10   Pain Score: 6    SpO2: SpO2: 97 % O2 Device:SpO2: 97 % O2 Flow Rate: .O2 Flow Rate (L/min): 3 L/min  IO: Intake/output summary:  Intake/Output Summary (Last 24 hours) at 07/28/15 0944 Last data filed at 07/28/15 0600  Gross per 24 hour  Intake   2050 ml  Output      0 ml  Net   2050 ml    LBM:   Baseline Weight: Weight: 64.6 kg (142 lb 6.7 oz) Most recent weight: Weight: 63.3 kg (139 lb 8.8 oz)       Palliative Assessment/Data:   Flowsheet Rows        Most Recent Value   Intake Tab  Referral Department  Critical care   Unit at Time of Referral  Oncology Unit   Palliative Care Primary Diagnosis  Cancer   Palliative Care Type  New Palliative care   Reason for referral  Non-pain Symptom, Clarify Goals of Care, Counsel Regarding Hospice   Date first seen by Palliative Care  07/28/15   Clinical Assessment    Palliative Performance Scale Score  40%   Pain Max last 24 hours  5   Pain Min Last 24 hours  3   Dyspnea Max Last 24 Hours  5   Dyspnea Min Last 24 hours  3   Psychosocial & Spiritual Assessment    Palliative Care Outcomes    Patient/Family meeting held?  Yes   Who was at the meeting?  patient son wife    Palliative Care Outcomes  Clarified goals of care   Palliative Care follow-up planned  Yes, Home      Time In: 0830 Time Out: 0930 Time Total:  60 min  Greater than 50%  of this time was spent counseling and coordinating care related to the above assessment and plan.  Signed by: Loistine Chance, MD  [phone] Please contact Palliative Medicine Team phone at [phone] for questions and concerns.  For individual provider: See Shea Evans

## 2015-07-28 NOTE — H&P (Addendum)
PULMONARY / CRITICAL CARE MEDICINE   Name: Steven Roberts MRN: [id number] DOB: Jan 18, 1966    ADMISSION DATE:  07/27/2015 CONSULTATION DATE:  07/27/15  REFERRING MD:  ED  CHIEF COMPLAINT:  Hypercarbic, hypoxic respiratory failure.  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 He has a history of pericardial effusion with cardiac tamponade, right pleural effusion status post pericardial window.  He has EGFR positive mutation. 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  He underwent a restaging CT today. Rapid response called for acute dyspnea, hypoxia. CT images reviewed which show progression of lung cancer, worsening right effusion, pneumonitis. ABG shows hypercarbic hypoxic respiratory failure. PCCM called for admission.  PAST MEDICAL HISTORY :  He  has a past medical history of Bone cancer ([place]) (11/03/14 Pet scan); Lung cancer ([place]) (10/23/14); Pericardial effusion with cardiac tamponade (12/11/2014); Pulmonary infiltrates  c/w Adenoca/ Stage IV  (10/13/2014); Non-small cell carcinoma of lung, stage 4 ([place]) (11/12/2014); Hypoalbuminemia due to protein-calorie malnutrition ([place]) (12/02/2014); Hyponatremia (12/10/2014); Hyperphosphatemia (12/02/2014); Bone metastasis ([place]) (11/25/2014); S/P radiation therapy (12/07/14-01/18/15); Diabetes mellitus without complication ([place]); Encounter for antineoplastic chemotherapy (03/09/2015); and Pulmonary hypertension [facility]): Per 2 d echo 06/03/2015 (06/03/2015).  PAST SURGICAL HISTORY: He  has past surgical history that includes Video bronchoscopy (Bilateral, 10/23/2014); Subxyphoid pericardial window (N/A, 12/11/2014); and TEE without cardioversion (N/A, 12/11/2014).  No Known Allergies  No current facility-administered medications on file prior to encounter.   Current Outpatient Prescriptions on File Prior to Encounter  Medication  Sig  . HYDROcodone-acetaminophen (NORCO/VICODIN) 5-325 MG tablet Take 1-2 tablets by mouth every 6 (six) hours as needed for moderate pain.  Marland Kitchen ibuprofen (ADVIL) 200 MG tablet Take 400 mg by mouth every 6 (six) hours as needed for mild pain. Reported on 03/09/2015  . osimertinib mesylate (TAGRISSO) 80 MG tablet Take 1 tablet (80 mg total) by mouth daily.  Marland Kitchen albuterol (PROVENTIL) (2.5 MG/3ML) 0.083% nebulizer solution Take 3 mLs (2.5 mg total) by nebulization every 6 (six) hours as needed for wheezing or shortness of breath. (Patient not taking: Reported on 07/08/2015)  . benzonatate (TESSALON) 200 MG capsule Take 1 capsule (200 mg total) by mouth 3 (three) times daily. (Patient not taking: Reported on 07/08/2015)  . dexamethasone (DECADRON) 4 MG tablet 4 mg by mouth twice a day the day before, day of and day after the chemotherapy every 3 weeks (Patient not taking: Reported on 06/10/2015)  . feeding supplement, ENSURE ENLIVE, (ENSURE ENLIVE) LIQD Take 237 mLs by mouth 2 (two) times daily between meals. (Patient not taking: Reported on 07/08/2015)  . fentaNYL (DURAGESIC - DOSED MCG/HR) 50 MCG/HR Place 1 patch (50 mcg total) onto the skin every 3 (three) days. (Patient not taking: Reported on 07/08/2015)  . Fluticasone-Salmeterol (ADVAIR) 250-50 MCG/DOSE AEPB Inhale 1 puff into the lungs 2 (two) times daily. (Patient not taking: Reported on 07/08/2015)  . folic acid (FOLVITE) 1 MG tablet Take 1 tablet (1 mg total) by mouth daily. (Patient not taking: Reported on 06/10/2015)  . glimepiride (AMARYL) 2 MG tablet Take 1 tablet (2 mg total) by mouth daily with breakfast. (Patient not taking: Reported on 06/10/2015)  . HYDROcodone-homatropine (HYCODAN) 5-1.5 MG/5ML syrup Take 5 mLs by mouth every 6 (six) hours as needed for cough. (Patient not taking: Reported on 07/27/2015)  . morphine (MS CONTIN) 15 MG 12 hr tablet Take 1 tablet (15 mg total) by mouth  every 12 (twelve) hours. (Patient not taking: Reported on 07/27/2015)   . pantoprazole (PROTONIX) 40 MG tablet Take 1 tablet (40 mg total) by mouth daily. (Patient not taking: Reported on 07/08/2015)  . tiotropium (SPIRIVA HANDIHALER) 18 MCG inhalation capsule Place 1 capsule (18 mcg total) into inhaler and inhale daily. (Patient not taking: Reported on 07/08/2015)    FAMILY HISTORY:  His indicated that his mother is alive. He indicated that his father is deceased.   SOCIAL HISTORY: He  reports that he has never smoked. He has never used smokeless tobacco. He reports that he does not drink alcohol or use illicit drugs.  REVIEW OF SYSTEMS:   Complains of worsening dyspnea, cough. No sputum production, fevers, chills. Complains of right chest pain, worsening on inspiration. No palpitations. No nausea, vomiting, diarrhea, consultation. All other review of systems are negative.  SUBJECTIVE:    VITAL SIGNS: BP 145/75 mmHg  Pulse 117  Temp(Src) 98.5 F (36.9 C) (Oral)  Resp 26  Ht '5\' 7"'$  (1.702 m)  Wt 139 lb 8.8 oz (63.3 kg)  BMI 21.85 kg/m2  SpO2 97%  HEMODYNAMICS:    VENTILATOR SETTINGS:    INTAKE / OUTPUT: I/O last 3 completed shifts: In: 2050 [I.V.:1350; IV Piggyback:700] Out: -   PHYSICAL EXAMINATION: General:  Moderate distress, Neuro: Awake, alert, oriented. No gross focal deficits HEENT:  Moist mucous membranes, no thyromegaly, JVD Cardiovascular:  Tachycardia, regular rate. No murmurs rubs gallops Lungs:  Bilateral crackles Abdomen:  Soft, positive bowel sounds Musculoskeletal: 3+ peripheral pitting edema Skin:  Intact  LABS:  BMET  Recent Labs Lab 07/22/15 0915 07/27/15 0905 07/28/15 0013  NA 134* 133* 137  K 4.4 4.3 4.2  CL  --  93* 96*  CO2 33* 34* 36*  BUN 11.'2 20 18  '$ CREATININE 0.7 0.67 0.55*  GLUCOSE 168* 154* 156*    Electrolytes  Recent Labs Lab 07/22/15 0915 07/27/15 0905 07/28/15 0013  CALCIUM 9.3 8.9 8.8*    CBC  Recent Labs Lab 07/22/15 0915 07/27/15 0905 07/28/15 0013  WBC 7.7 10.6* 7.6   HGB 10.0* 9.6* 9.2*  HCT 32.0* 30.8* 29.9*  PLT 469* 496* 423*    Coag's No results for input(s): APTT, INR in the last 168 hours.  Sepsis Markers  Recent Labs Lab 07/27/15 0917 07/27/15 1225  LATICACIDVEN 0.98 1.2  PROCALCITON  --  <0.10    ABG  Recent Labs Lab 07/27/15 0930 07/27/15 1700  PHART 7.357 7.348*  PCO2ART 64.3* 66.1*  PO2ART 75.4* 68.3*    Liver Enzymes  Recent Labs Lab 07/22/15 0915 07/27/15 0905  AST 11 24  ALT <9 20  ALKPHOS 112 134*  BILITOT 0.38 0.5  ALBUMIN 2.3* 2.5*    Cardiac Enzymes  Recent Labs Lab 07/27/15 1225 07/27/15 1807 07/28/15 0013  TROPONINI 0.05* 0.05* 0.04*    Glucose  Recent Labs Lab 07/27/15 1635 07/27/15 1956 07/27/15 2327 07/28/15 0341 07/28/15 0753  GLUCAP 137* 150* 168* 141* 141*    Imaging No new imaging   STUDIES:  CT chest/abd/pelvis 6/13 > progression of disease. Right pleural effusion, pericardial effusion. Images reviewed. Echo 6/13 > LVEF 55-60%  Large pericardial effusion with no clear evidence of tamponade.  CULTURES: Bcx 6/13 >  Ucx 6/13 > Sputum Cx 6/13>  ANTIBIOTICS: Vanco 6/13 >6/14 Zosyn 6/13> 6/14  SIGNIFICANT EVENTS: 6/13 > Admit with resp failure, thoracentesis  LINES/TUBES:  DISCUSSION: Steven Roberts has Stage IV lung cancer and is on Tagrisso therapy. Admitted with hypoxic, hypercarbic  resp failure Likely from progression of cancer with re development of pleural, pericardial   ASSESSMENT / PLAN:  PULMONARY A: Hypoxic, hypercarbic resp failure Rt pleural effusion Wheezing on examination. ? COPD exacerbation. (he is a non smoker) P:   Start tapering steroids Start spiriva and duelra. Duonebs PRN Repeat CXR tomorrow. If there is rapid reaccumulation of pleural fluid then we can consider pleurex for palliation.  CARDIOVASCULAR A:  H/O Malignant pericardial effusion s/p window Repeat echo does not show tamponade. P:  Monitor hemodynamics  RENAL A:    Stable P:   Monitor urine output and Cr  GASTROINTESTINAL A:   Stable P:   Start diet  HEMATOLOGIC A:   Mild leukocytosis P: Recheck CBC  INFECTIOUS A:   Suspected HCAP P:   D/C all abx as Pct is negative. Follow cultures  ENDOCRINE A:   DM P:   SSI coverage Start amaryl  NEUROLOGIC A:   Stable P:    FAMILY  - Updates: patient and wife updated at bedside. They will disucss with palliative care regarding hospice.  - Inter-disciplinary family meet or Palliative Care meeting due by:  6/20  Transfer to med-surg and [facility].  Marshell Garfinkel MD [facility] Pager [phone] If no answer or after 3pm call: [phone] 07/28/2015, 9:22 AM

## 2015-07-28 NOTE — Plan of Care (Signed)
Problem: Health Behavior/Discharge Planning: Goal: Ability to manage health-related needs will improve Outcome: Not Progressing Palliative consult today. Patient changed to a DNR. Will be seen by hospice when discharge.

## 2015-07-28 NOTE — Progress Notes (Addendum)
Initial Nutrition Assessment  DOCUMENTATION CODES:   Not applicable  INTERVENTION:  - Encourage PO intakes of meals. - RD will follow-up 6/15 and will order nutrition supplements per pt preference at that time.  NUTRITION DIAGNOSIS:   Increased nutrient needs related to catabolic illness, cancer and cancer related treatments as evidenced by estimated needs.  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 He has a history of pericardial effusion with cardiac tamponade, right pleural effusion status post pericardial window.  He has EGFR positive mutation. 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 He underwent a restaging CT today. Rapid response called for acute dyspnea, hypoxia. CT images reviewed which show progression of lung cancer, worsening right effusion, pneumonitis. ABG shows hypercarbic hypoxic respiratory failure.  Pt seen for MST. BMI indicates normal weight. Diet advanced from NPO to Regular today at 0920. Able to speak with pt very briefly as breakfast tray arrived shortly after RD entered the room and pt states he is feeling hungry this AM which is unusual for him. Pt states that poor appetite was usual PTA.   Pt being [facility] by RD at the [facility] and last visit was in March 2017 as pt did not show up for his appointment with that RD on 07/26/15. MD notes indicate plan for Palliative care consult; will monitor for note related to this. Unable to complete physical assessment this AM. Per chart review, pt has lost 35 lbs (20% body weight) in the past 3 months which is significant for time frame. Pt certainly meets criteria for malnutrition but unable to document severity until physical  assessment is performed.   RD will follow-up with pt 6/15 and obtain all needed information and complete physical assessment, documentation of malnutrition. Not meeting needs PTA based on information currently available. Medications reviewed. IVF: NS @ 75 mL/hr. Labs reviewed; Cl: 96 mmol/L, creatinine low, Ca: 8.8 mg/dL.   ADDENDUM: Palliative care has seen pt with associated note filed today at [phone]. Pt is now DNR/DNI and plan is to d/c with hospice. Note states "goals shifting towards full comfort." RD will still follow-up tomorrow to assess for needs, questions per pt/family.    Diet Order:  Diet regular Room service appropriate?: Yes; Fluid consistency:: Thin  Skin:  Reviewed, no issues  Last BM:  6/13  Height:   Ht Readings from Last 1 Encounters:  07/27/15 _0  (1.702 m)    Weight:   Wt Readings from Last 1 Encounters:  07/28/15 139 lb 8.8 oz (63.3 kg)    Ideal Body Weight:  67.27 kg (kg)  BMI:  Body mass index is 21.85 kg/(m^2).  Estimated Nutritional Needs:   Kcal:  5462-7035 (30-35 kcal/kg)  Protein:  95-108 grams (1.5-1.7 grams/kg)  Fluid:  >/= 2 L/day  EDUCATION NEEDS:   No education needs identified at this time     [name], MS, RD, LDN Inpatient Clinical Dietitian Pager # [phone] After hours/weekend pager # [phone]

## 2015-07-29 ENCOUNTER — Telehealth: Payer: Self-pay | Admitting: *Deleted

## 2015-07-29 ENCOUNTER — Ambulatory Visit: Payer: Self-pay | Admitting: Internal Medicine

## 2015-07-29 ENCOUNTER — Inpatient Hospital Stay (HOSPITAL_COMMUNITY): Payer: Medicaid Other

## 2015-07-29 ENCOUNTER — Other Ambulatory Visit: Payer: Self-pay

## 2015-07-29 LAB — CBC
HCT: 31.4 % — ABNORMAL LOW (ref 39.0–52.0)
HEMOGLOBIN: 9.2 g/dL — AB (ref 13.0–17.0)
MCH: 24.8 pg — AB (ref 26.0–34.0)
MCHC: 29.3 g/dL — ABNORMAL LOW (ref 30.0–36.0)
MCV: 84.6 fL (ref 78.0–100.0)
PLATELETS: 452 10*3/uL — AB (ref 150–400)
RBC: 3.71 MIL/uL — AB (ref 4.22–5.81)
RDW: 15.2 % (ref 11.5–15.5)
WBC: 14.5 10*3/uL — AB (ref 4.0–10.5)

## 2015-07-29 LAB — BASIC METABOLIC PANEL
ANION GAP: 3 — AB (ref 5–15)
BUN: 16 mg/dL (ref 6–20)
CHLORIDE: 95 mmol/L — AB (ref 101–111)
CO2: 37 mmol/L — ABNORMAL HIGH (ref 22–32)
Calcium: 8.9 mg/dL (ref 8.9–10.3)
Creatinine, Ser: 0.48 mg/dL — ABNORMAL LOW (ref 0.61–1.24)
Glucose, Bld: 136 mg/dL — ABNORMAL HIGH (ref 65–99)
POTASSIUM: 4.2 mmol/L (ref 3.5–5.1)
SODIUM: 135 mmol/L (ref 135–145)

## 2015-07-29 LAB — GLUCOSE, CAPILLARY
GLUCOSE-CAPILLARY: 130 mg/dL — AB (ref 65–99)
GLUCOSE-CAPILLARY: 171 mg/dL — AB (ref 65–99)
GLUCOSE-CAPILLARY: 174 mg/dL — AB (ref 65–99)
GLUCOSE-CAPILLARY: 119 mg/dL — AB (ref 65–99)
GLUCOSE-CAPILLARY: 221 mg/dL — AB (ref 65–99)

## 2015-07-29 MED ORDER — MOMETASONE FURO-FORMOTEROL FUM 200-5 MCG/ACT IN AERO: 2.0000 | INHALATION_SPRAY | Freq: Two times a day (BID) | RESPIRATORY_TRACT | Status: AC

## 2015-07-29 MED ORDER — GLIMEPIRIDE 2 MG PO TABS: 2.0000 mg | ORAL_TABLET | Freq: Every day | ORAL | Status: AC

## 2015-07-29 MED ORDER — MORPHINE SULFATE (CONCENTRATE) 10 MG/0.5ML PO SOLN: 5.0000 mg | ORAL | Status: AC | PRN

## 2015-07-29 MED ORDER — TIOTROPIUM BROMIDE MONOHYDRATE 18 MCG IN CAPS: 18.0000 ug | ORAL_CAPSULE | Freq: Every day | RESPIRATORY_TRACT | Status: AC

## 2015-07-29 MED ORDER — HYDROCODONE-HOMATROPINE 5-1.5 MG/5ML PO SYRP: 5.0000 mL | ORAL_SOLUTION | ORAL | Status: AC | PRN

## 2015-07-29 NOTE — Progress Notes (Signed)
PULMONARY / CRITICAL CARE MEDICINE   Name: Steven Roberts MRN: [id number] DOB: Jan 18, 1966    ADMISSION DATE:  07/27/2015 CONSULTATION DATE:  07/27/15  REFERRING MD:  ED  CHIEF COMPLAINT:  Hypercarbic, hypoxic respiratory failure.  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 He has a history of pericardial effusion with cardiac tamponade, right pleural effusion status post pericardial window.  He has EGFR positive mutation. 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  He underwent a restaging CT today. Rapid response called for acute dyspnea, hypoxia. CT images reviewed which show progression of lung cancer, worsening right effusion, pneumonitis. ABG shows hypercarbic hypoxic respiratory failure. PCCM called for admission.  PAST MEDICAL HISTORY :  He  has a past medical history of Bone cancer ([place]) (11/03/14 Pet scan); Lung cancer ([place]) (10/23/14); Pericardial effusion with cardiac tamponade (12/11/2014); Pulmonary infiltrates  c/w Adenoca/ Stage IV  (10/13/2014); Non-small cell carcinoma of lung, stage 4 ([place]) (11/12/2014); Hypoalbuminemia due to protein-calorie malnutrition ([place]) (12/02/2014); Hyponatremia (12/10/2014); Hyperphosphatemia (12/02/2014); Bone metastasis ([place]) (11/25/2014); S/P radiation therapy (12/07/14-01/18/15); Diabetes mellitus without complication ([place]); Encounter for antineoplastic chemotherapy (03/09/2015); and Pulmonary hypertension [facility]): Per 2 d echo 06/03/2015 (06/03/2015).  PAST SURGICAL HISTORY: He  has past surgical history that includes Video bronchoscopy (Bilateral, 10/23/2014); Subxyphoid pericardial window (N/A, 12/11/2014); and TEE without cardioversion (N/A, 12/11/2014).  No Known Allergies  No current facility-administered medications on file prior to encounter.   Current Outpatient Prescriptions on File Prior to Encounter  Medication  Sig  . HYDROcodone-acetaminophen (NORCO/VICODIN) 5-325 MG tablet Take 1-2 tablets by mouth every 6 (six) hours as needed for moderate pain.  Marland Kitchen ibuprofen (ADVIL) 200 MG tablet Take 400 mg by mouth every 6 (six) hours as needed for mild pain. Reported on 03/09/2015  . osimertinib mesylate (TAGRISSO) 80 MG tablet Take 1 tablet (80 mg total) by mouth daily.  Marland Kitchen albuterol (PROVENTIL) (2.5 MG/3ML) 0.083% nebulizer solution Take 3 mLs (2.5 mg total) by nebulization every 6 (six) hours as needed for wheezing or shortness of breath. (Patient not taking: Reported on 07/08/2015)  . benzonatate (TESSALON) 200 MG capsule Take 1 capsule (200 mg total) by mouth 3 (three) times daily. (Patient not taking: Reported on 07/08/2015)  . dexamethasone (DECADRON) 4 MG tablet 4 mg by mouth twice a day the day before, day of and day after the chemotherapy every 3 weeks (Patient not taking: Reported on 06/10/2015)  . feeding supplement, ENSURE ENLIVE, (ENSURE ENLIVE) LIQD Take 237 mLs by mouth 2 (two) times daily between meals. (Patient not taking: Reported on 07/08/2015)  . fentaNYL (DURAGESIC - DOSED MCG/HR) 50 MCG/HR Place 1 patch (50 mcg total) onto the skin every 3 (three) days. (Patient not taking: Reported on 07/08/2015)  . Fluticasone-Salmeterol (ADVAIR) 250-50 MCG/DOSE AEPB Inhale 1 puff into the lungs 2 (two) times daily. (Patient not taking: Reported on 07/08/2015)  . folic acid (FOLVITE) 1 MG tablet Take 1 tablet (1 mg total) by mouth daily. (Patient not taking: Reported on 06/10/2015)  . glimepiride (AMARYL) 2 MG tablet Take 1 tablet (2 mg total) by mouth daily with breakfast. (Patient not taking: Reported on 06/10/2015)  . HYDROcodone-homatropine (HYCODAN) 5-1.5 MG/5ML syrup Take 5 mLs by mouth every 6 (six) hours as needed for cough. (Patient not taking: Reported on 07/27/2015)  . morphine (MS CONTIN) 15 MG 12 hr tablet Take 1 tablet (15 mg total) by mouth  every 12 (twelve) hours. (Patient not taking: Reported on 07/27/2015)   . pantoprazole (PROTONIX) 40 MG tablet Take 1 tablet (40 mg total) by mouth daily. (Patient not taking: Reported on 07/08/2015)  . tiotropium (SPIRIVA HANDIHALER) 18 MCG inhalation capsule Place 1 capsule (18 mcg total) into inhaler and inhale daily. (Patient not taking: Reported on 07/08/2015)    FAMILY HISTORY:  His indicated that his mother is alive. He indicated that his father is deceased.   SOCIAL HISTORY: He  reports that he has never smoked. He has never used smokeless tobacco. He reports that he does not drink alcohol or use illicit drugs.  REVIEW OF SYSTEMS:   Complains of worsening dyspnea, cough. No sputum production, fevers, chills. Complains of right chest pain, worsening on inspiration. No palpitations. No nausea, vomiting, diarrhea, consultation. All other review of systems are negative.  SUBJECTIVE:    VITAL SIGNS: BP 111/69 mmHg  Pulse 106  Temp(Src) 98.3 F (36.8 C) (Oral)  Resp 20  Ht '5\' 7"'$  (1.702 m)  Wt 139 lb 8.8 oz (63.3 kg)  BMI 21.85 kg/m2  SpO2 100%  HEMODYNAMICS:    VENTILATOR SETTINGS:    INTAKE / OUTPUT: I/O last 3 completed shifts: In: 1965 [P.O.:240; I.V.:1275; IV Piggyback:450] Out: 200 [Urine:200]  PHYSICAL EXAMINATION: General:  Moderate distress, Neuro: Awake, alert, oriented. No gross focal deficits HEENT:  Moist mucous membranes, no thyromegaly, JVD Cardiovascular:  Tachycardia, regular rate. No murmurs rubs gallops Lungs:  Bilateral crackles Abdomen:  Soft, positive bowel sounds Musculoskeletal: 3+ peripheral pitting edema Skin:  Intact  LABS:  BMET  Recent Labs Lab 07/27/15 0905 07/28/15 0013 07/29/15 0408  NA 133* 137 135  K 4.3 4.2 4.2  CL 93* 96* 95*  CO2 34* 36* 37*  BUN '20 18 16  '$ CREATININE 0.67 0.55* 0.48*  GLUCOSE 154* 156* 136*    Electrolytes  Recent Labs Lab 07/27/15 0905 07/28/15 0013 07/29/15 0408  CALCIUM 8.9 8.8* 8.9    CBC  Recent Labs Lab 07/27/15 0905 07/28/15 0013  07/29/15 0408  WBC 10.6* 7.6 14.5*  HGB 9.6* 9.2* 9.2*  HCT 30.8* 29.9* 31.4*  PLT 496* 423* 452*    Coag's No results for input(s): APTT, INR in the last 168 hours.  Sepsis Markers  Recent Labs Lab 07/27/15 0917 07/27/15 1225  LATICACIDVEN 0.98 1.2  PROCALCITON  --  <0.10    ABG  Recent Labs Lab 07/27/15 0930 07/27/15 1700  PHART 7.357 7.348*  PCO2ART 64.3* 66.1*  PO2ART 75.4* 68.3*    Liver Enzymes  Recent Labs Lab 07/27/15 0905  AST 24  ALT 20  ALKPHOS 134*  BILITOT 0.5  ALBUMIN 2.5*    Cardiac Enzymes  Recent Labs Lab 07/27/15 1225 07/27/15 1807 07/28/15 0013  TROPONINI 0.05* 0.05* 0.04*    Glucose  Recent Labs Lab 07/28/15 2043 07/28/15 2359 07/29/15 0424 07/29/15 0733 07/29/15 1210 07/29/15 1657  GLUCAP 224* 178* 130* 171* 174* 119*    Imaging No new imaging   STUDIES:  CT chest/abd/pelvis 6/13 > progression of disease. Right pleural effusion, pericardial effusion. Images reviewed. Echo 6/13 > LVEF 55-60%  Large pericardial effusion with no clear evidence of tamponade. Pleural Cytology 6/13 > Malignant cells  CULTURES: Bcx 6/13 >  Ucx 6/13 > Sputum Cx 6/13>  ANTIBIOTICS: Vanco 6/13 >6/14 Zosyn 6/13> 6/14  SIGNIFICANT EVENTS: 6/13 > Admit with resp failure, thoracentesis  LINES/TUBES:  DISCUSSION: Steven Roberts has Stage IV lung cancer and is on Tagrisso therapy. Admitted with hypoxic,  hypercarbic resp failure Likely from progression of cancer with re development of malignant pleural effusion  Reccs: - Plan for home with hospice. - Recommend a pleurex cath for palliation as the effusion has rapid re accumulation on CXR today. - Can stop steroids. - Continue spiriva, dulera, duonebs.  Marshell Garfinkel MD [facility] Pager [phone] If no answer or after 3pm call: [phone] 07/29/2015, 6:02 PM

## 2015-07-29 NOTE — Progress Notes (Signed)
Patient states his shortness of breath is not really worse today, about the same.  O2 sat 98% on 5 liters, patient normally on 2 liters at home.  Did ask patient if he felt he could tolerate decreasing his oxygen any at this point to which he stated no.  Will re-evaluate tomorrow after fluid removed from lung, may require less oxygen at that time.

## 2015-07-29 NOTE — Telephone Encounter (Signed)
TC from RN with [place] asking if Dr. Julien Nordmann would be pt's attending for Hospice services.  Spoke with Dr. Julien Nordmann. He stated he would be the attending for this pt. Susa Day, RN of the above.

## 2015-07-29 NOTE — Care Management Note (Signed)
Case Management Note  Patient Details  Name: Steven Roberts MRN: [id number] Date of Birth: 01-18-1966  Subjective/Objective:     50 yo admitted with acute respiratory failure. Hx of lung CA               Action/Plan: From home with wife.  CM consult for home hospice.  Choice offered to pt and family and [facility] was chosen. [facility] [place] called for referral. CM will continue to follow and assist with DC needs.  Expected Discharge Date:   (unknown)               Expected Discharge Plan:  Home w Hospice Care  In-House Referral:     Discharge planning Services  CM Consult  Post Acute Care Choice:    Choice offered to:  Adult Children, Patient, Spouse  DME Arranged:    DME Agency:     HH Arranged:  Disease Management [place] Agency:  [place]  Status of Service:  In process, will continue to follow  Medicare Important Message Given:    Date Medicare IM Given:    Medicare IM give by:    Date Additional Medicare IM Given:    Additional Medicare Important Message give by:     If discussed at [place] of Stay Meetings, dates discussed:    Additional CommentsLynnell Catalan, RN 07/29/2015, 9:48 AM  [phone]

## 2015-07-29 NOTE — Progress Notes (Signed)
Daily Progress Note   Patient Name: Steven Roberts       Date: 07/29/2015 DOB: 08-Dec-1965  Age: 50 y.o. MRN#: [id number] Attending Physician: Mir Marry Guan,* Primary Care Physician: [place] Date: 07/27/2015  Reason for Consultation/Follow-up: Non pain symptom management and Pain control  Subjective:  resting in bed, received Morphine earlier this am.  In mild respiratory distress Is eating some bites Wife and son at bedside.   Length of Stay: 2  Current Medications: Scheduled Meds:  . enoxaparin (LOVENOX) injection  40 mg Subcutaneous Q24H  . glimepiride  2 mg Oral Q breakfast  . insulin aspart  0-9 Units Subcutaneous Q4H  . methylPREDNISolone (SOLU-MEDROL) injection  40 mg Intravenous Q12H  . mometasone-formoterol  2 puff Inhalation BID  . tiotropium  18 mcg Inhalation Daily    Continuous Infusions:    PRN Meds: sodium chloride, HYDROcodone-homatropine, HYDROmorphone (DILAUDID) injection, ipratropium-albuterol, morphine CONCENTRATE, vitamin A & D  Physical Exam         Resting in bed Appears fatigued, dyspneic Diffuse rhonchi anteriorly S1 S2 Abdomen soft  No edema  Vital Signs: BP 128/79 mmHg  Pulse 107  Temp(Src) 98 F (36.7 C) (Oral)  Resp 20  Ht '5\' 7"'$  (1.702 m)  Wt 63.3 kg (139 lb 8.8 oz)  BMI 21.85 kg/m2  SpO2 97% SpO2: SpO2: 97 % O2 Device: O2 Device: Not Delivered O2 Flow Rate: O2 Flow Rate (L/min): 5 L/min  Intake/output summary:  Intake/Output Summary (Last 24 hours) at 07/29/15 5885 Last data filed at 07/28/15 1926  Gross per 24 hour  Intake    465 ml  Output    200 ml  Net    265 ml   LBM:   Baseline Weight: Weight: 64.6 kg (142 lb 6.7 oz) Most recent weight: Weight: 63.3 kg (139 lb 8.8 oz)       Palliative  Assessment/Data:    Flowsheet Rows        Most Recent Value   Intake Tab    Referral Department  Hospitalist   Unit at Time of Referral  Oncology Unit   Palliative Care Primary Diagnosis  Cancer   Date Notified  07/27/15   Palliative Care Type  Return patient Palliative Care   Reason for referral  Pain, Non-pain Symptom, Counsel Regarding Hospice  Date of Admission  07/27/15   Date first seen by Palliative Care  07/28/15   # of days Palliative referral response time  1 Day(s)   # of days IP prior to Palliative referral  0   Clinical Assessment    Palliative Performance Scale Score  30%   Pain Max last 24 hours  5   Pain Min Last 24 hours  3   Dyspnea Max Last 24 Hours  5   Dyspnea Min Last 24 hours  4   Psychosocial & Spiritual Assessment    Palliative Care Outcomes    Patient/Family meeting held?  Yes   Who was at the meeting?  patient, wife, son.    Palliative Care Outcomes  Clarified goals of care   Palliative Care follow-up planned  Yes, Home      Patient Active Problem List   Diagnosis Date Noted  . Dyspnea   . Metastatic lung cancer (metastasis from lung to other site) [facility])   . Encounter for palliative care   . Goals of care, counseling/discussion   . Acute respiratory failure ([place]) 07/27/2015  . Cancer associated pain 07/02/2015  . Pulmonary hypertension [facility]): Per 2 d echo 06/03/2015 06/03/2015  . Tachycardia 06/03/2015  . SOB (shortness of breath) 06/02/2015  . HCAP (healthcare-associated pneumonia) 06/01/2015  . Dehydration 05/24/2015  . CAP (community acquired pneumonia) 05/08/2015  . Pleural effusion on right 05/08/2015  . Pleuritic chest pain 05/08/2015  . Encounter for antineoplastic chemotherapy 03/09/2015  . Diabetes mellitus type 2 in nonobese ([place]) 12/20/2014  . Pericardial effusion with cardiac tamponade 12/11/2014  . Hyperglycemia 12/02/2014  . Hypoalbuminemia due to protein-calorie malnutrition ([place]) 12/02/2014  . Bone metastasis ([name])  11/25/2014  . Primary cancer of left upper lobe of lung ([place]) 11/12/2014  . Pulmonary infiltrates  c/w Adenoca/ Stage IV  10/13/2014    Palliative Care Assessment & Plan   Patient Profile:   Assessment:  Stage IV metastatic lung cancer Pleural effusion Pericardial effusion Dyspnea Pain   Recommendations/Plan:  Case manager consult for initiating hospice referral: patient lives in [place], [place]. Wife Marcela wants [facility] [place], wants to take the patient home.   Continue current prn medications   Code Status:    Code Status Orders        Start     Ordered   07/28/15 0928  Do not attempt resuscitation (DNR)   Continuous    Question Answer Comment  In the event of cardiac or respiratory ARREST Do not call a "code blue"   In the event of cardiac or respiratory ARREST Do not perform Intubation, CPR, defibrillation or ACLS   In the event of cardiac or respiratory ARREST Use medication by any route, position, wound care, and other measures to relive pain and suffering. May use oxygen, suction and manual treatment of airway obstruction as needed for comfort.      07/28/15 0932    Code Status History    Date Active Date Inactive Code Status Order ID Comments User Context   07/27/2015 11:53 AM 07/28/2015  9:32 AM Full Code [id number]  Marshell Garfinkel, MD ED   06/01/2015  1:44 AM 06/03/2015  9:46 PM Full Code [id number]  Reubin Milan, MD Inpatient   05/08/2015 12:43 PM 05/12/2015  7:18 PM Full Code [id number]  Venetia Maxon Rama, MD Inpatient   12/11/2014  8:52 PM 12/20/2014  7:57 PM Full Code [id number]  Rexene Alberts, MD Inpatient   12/10/2014  7:03  PM 12/11/2014  8:52 PM Full Code [id number]  Elmarie Shiley, MD Inpatient       Prognosis:   < 4 weeks  Discharge Planning:  Home with Hospice  Care plan was discussed with  Patient wife son and Dr. Hollice Gong.   Thank you for allowing the Palliative Medicine Team to assist in the care of this patient.   Time  In: 830 Time Out: 905 Total Time 35 Prolonged Time Billed  no       Greater than 50%  of this time was spent counseling and coordinating care related to the above assessment and plan.  Loistine Chance, MD [phone]  Please contact Palliative Medicine Team phone at [phone] for questions and concerns.

## 2015-07-29 NOTE — Progress Notes (Signed)
PROGRESS NOTE  Steven Roberts MHD:[id number] DOB: 1966-02-02 DOA: 07/27/2015 PCP: [place]  HPI/Recap of past 24 hours: 57M with advancing metastatic lung adenocarcinoma admitted for SOB, underwent thoracentesis by pulmonary critical care 6/13 who has elected for home with hospice. Was seen this AM, resting comfortably without acute complain on 5 liters [place] oxygen. Discussed with son and wife as well.   Assessment/Plan: Active Problems:   Acute respiratory failure (HCC) - due to advancing cancer and pulmonary effusion.  - make NPO at [place] - IR consulted, plan for thoracentesis/Pleurex in AM - home with hospice to follow AM procedure   Dyspnea   Metastatic lung cancer (metastasis from lung to other site) [facility]) - oncology has seen and recommends hospice   Encounter for palliative care - appreciate pall care involvement, pt and family have elected for home with hospice   Goals of care, counseling/discussion   Code Status: DNR   Family Communication: Son and wife at bedside this AM   Disposition Plan: Home with hospice in AM    Consultants:  Onc   Procedures:  Thoracentesis by Dr. Vaughan Browner 6/13   Antimicrobials:  None    Objective: Filed Vitals:   07/28/15 1427 07/28/15 2047 07/29/15 0613 07/29/15 1358  BP: 131/74 130/82 128/79 111/69  Pulse: 118 114 107 106  Temp: 98.6 F (37 C) 98.7 F (37.1 C) 98 F (36.7 C) 98.3 F (36.8 C)  TempSrc: Oral Oral Oral Oral  Resp: '22 21 20 20  '$ Height:      Weight:      SpO2: 95% 95% 97% 100%    Intake/Output Summary (Last 24 hours) at 07/29/15 1722 Last data filed at 07/29/15 1242  Gross per 24 hour  Intake      0 ml  Output    500 ml  Net   -500 ml   Filed Weights   07/27/15 1400 07/28/15 0359  Weight: 64.6 kg (142 lb 6.7 oz) 63.3 kg (139 lb 8.8 oz)    Exam: General:  Alert, oriented, calm, in no acute distress Eyes: pupils round and reactive to light and accomodation, clear sclerea Neck: supple, no masses,  trachea mildline  Cardiovascular: RRR, no murmurs or rubs, no peripheral edema  Respiratory: diminshed at bases esp on R, on [place] o2  Abdomen: soft, nontender, nondistended, normal bowel tones heard  Skin: dry, no rashes  Musculoskeletal: no joint effusions, normal range of motion  Psychiatric: appropriate affect, normal speech  Neurologic: extraocular muscles intact, clear speech, moving all extremities with intact sensorium    Data Reviewed: CBC:  Recent Labs Lab 07/27/15 0905 07/28/15 0013 07/29/15 0408  WBC 10.6* 7.6 14.5*  NEUTROABS 9.2*  --   --   HGB 9.6* 9.2* 9.2*  HCT 30.8* 29.9* 31.4*  MCV 79.8 82.6 84.6  PLT 496* 423* 211*   Basic Metabolic Panel:  Recent Labs Lab 07/27/15 0905 07/28/15 0013 07/29/15 0408  NA 133* 137 135  K 4.3 4.2 4.2  CL 93* 96* 95*  CO2 34* 36* 37*  GLUCOSE 154* 156* 136*  BUN '20 18 16  '$ CREATININE 0.67 0.55* 0.48*  CALCIUM 8.9 8.8* 8.9   GFR: Estimated Creatinine Clearance: 102.2 mL/min (by C-G formula based on Cr of 0.48). Liver Function Tests:  Recent Labs Lab 07/27/15 0905 07/27/15 1225  AST 24  --   ALT 20  --   ALKPHOS 134*  --   BILITOT 0.5  --   PROT 7.6 7.5  ALBUMIN 2.5*  --  No results for input(s): LIPASE, AMYLASE in the last 168 hours. No results for input(s): AMMONIA in the last 168 hours. Coagulation Profile: No results for input(s): INR, PROTIME in the last 168 hours. Cardiac Enzymes:  Recent Labs Lab 07/27/15 1225 07/27/15 1807 07/28/15 0013  TROPONINI 0.05* 0.05* 0.04*   BNP (last 3 results) No results for input(s): PROBNP in the last 8760 hours. HbA1C: No results for input(s): HGBA1C in the last 72 hours. CBG:  Recent Labs Lab 07/28/15 2359 07/29/15 0424 07/29/15 0733 07/29/15 1210 07/29/15 1657  GLUCAP 178* 130* 171* 174* 119*   Lipid Profile: No results for input(s): CHOL, HDL, LDLCALC, TRIG, CHOLHDL, LDLDIRECT in the last 72 hours. Thyroid Function Tests: No results for input(s):  TSH, T4TOTAL, FREET4, T3FREE, THYROIDAB in the last 72 hours. Anemia Panel: No results for input(s): VITAMINB12, FOLATE, FERRITIN, TIBC, IRON, RETICCTPCT in the last 72 hours. Urine analysis:    Component Value Date/Time   COLORURINE YELLOW 07/27/2015 1120   APPEARANCEUR CLEAR 07/27/2015 1120   LABSPEC >1.046* 07/27/2015 1120   PHURINE 5.0 07/27/2015 1120   GLUCOSEU NEGATIVE 07/27/2015 1120   HGBUR LARGE* 07/27/2015 1120   [place] 07/27/2015 1120   KETONESUR NEGATIVE 07/27/2015 1120   PROTEINUR NEGATIVE 07/27/2015 1120   UROBILINOGEN 0.2 12/10/2014 1553   NITRITE NEGATIVE 07/27/2015 1120   LEUKOCYTESUR NEGATIVE 07/27/2015 1120   Sepsis Labs: '@LABRCNTIP'$ (procalcitonin:4,lacticidven:4)  ) Recent Results (from the past 240 hour(s))  Culture, blood (routine x 2)     Status: None (Preliminary result)   Collection Time: 07/27/15  9:16 AM  Result Value Ref [place] Status   Specimen Description BLOOD LEFT FOREARM  Final   Special Requests BOTTLES DRAWN AEROBIC AND ANAEROBIC 5ML  Final   Culture   Final    NO GROWTH 2 DAYS Performed at [facility]    Report Status PENDING  Incomplete  Culture, blood (routine x 2)     Status: None (Preliminary result)   Collection Time: 07/27/15  9:42 AM  Result Value Ref Range Status   Specimen Description BLOOD RIGHT ANTECUBITAL  Final   Special Requests BOTTLES DRAWN AEROBIC AND ANAEROBIC 5ML  Final   Culture   Final    NO GROWTH 2 DAYS Performed at [facility]    Report Status PENDING  Incomplete  Body fluid culture     Status: None (Preliminary result)   Collection Time: 07/27/15 11:45 AM  Result Value Ref Range Status   Specimen Description PLEURAL RIGHT  Final   Special Requests Immunocompromised  Final   Gram Stain   Final    CYTOSPIN WBC PRESENT,BOTH PMN AND MONONUCLEAR NO ORGANISMS SEEN Gram Stain Report Called to,Read Back By and Verified With: M. HAMBY RN AT 3903 ON 06.13.17 BY SHUEA    Culture    Final    NO GROWTH 2 DAYS Performed at [facility]    Report Status PENDING  Incomplete  Urine culture     Status: Abnormal   Collection Time: 07/27/15 11:57 AM  Result Value Ref Range Status   Specimen Description URINE, CATHETERIZED  Final   Special Requests NONE  Final   Culture MULTIPLE SPECIES PRESENT, SUGGEST RECOLLECTION (A)  Final   Report Status 07/28/2015 FINAL  Final  MRSA PCR Screening     Status: None   Collection Time: 07/27/15  1:18 PM  Result Value Ref Range Status   MRSA by PCR NEGATIVE NEGATIVE Final    Comment:  The GeneXpert MRSA Assay (FDA approved for NASAL specimens only), is one component of a comprehensive MRSA colonization surveillance program. It is not intended to diagnose MRSA infection nor to guide or monitor treatment for MRSA infections.       Studies: Dg Chest Port 1 View  07/29/2015  CLINICAL DATA:  Respiratory failure. EXAM: PORTABLE CHEST 1 VIEW COMPARISON:  07/27/2015, 07/01/2015.  CT 07/27/2015, 03/30/2015. FINDINGS: Patient is rotated to the right. Mediastinum is stable. Persistent bilateral pulmonary opacities consistent with metastatic disease again noted. Cardiomegaly with progressive pulmonary interstitial prominence and progressive right pleural effusion. Tiny left pleural effusion again noted. A component congestive heart failure may be present. Bilateral pneumonia cannot be excluded. No pneumothorax. No acute bony abnormality . IMPRESSION: 1. Findings again noted consistent with diffuse pulmonary metastatic disease. 2. Noted on today's exam is cardiomegaly with increasing interstitial prominence and increasing right pleural effusion. Small left pleural effusion again noted. Findings suggest developing congestive heart failure. Electronically Signed   By: Marcello Moores  Register   On: 07/29/2015 07:29    Scheduled Meds: . glimepiride  2 mg Oral Q breakfast  . insulin aspart  0-9 Units Subcutaneous Q4H  . methylPREDNISolone  (SOLU-MEDROL) injection  40 mg Intravenous Q12H  . mometasone-formoterol  2 puff Inhalation BID  . tiotropium  18 mcg Inhalation Daily    Continuous Infusions:    LOS: 2 days   Time spent: 23 min  Mir Marry Guan, MD [facility] Pager [phone]  If 7PM-7AM, please contact night-coverage [email or web address] Password [email or web address] 07/29/2015, 5:22 PM

## 2015-07-30 ENCOUNTER — Encounter (HOSPITAL_COMMUNITY): Payer: Self-pay | Admitting: General Surgery

## 2015-07-30 ENCOUNTER — Telehealth: Payer: Self-pay | Admitting: *Deleted

## 2015-07-30 ENCOUNTER — Inpatient Hospital Stay (HOSPITAL_COMMUNITY): Payer: Medicaid Other

## 2015-07-30 DIAGNOSIS — C3490 Malignant neoplasm of unspecified part of unspecified bronchus or lung: Secondary | ICD-10-CM

## 2015-07-30 LAB — PROTIME-INR
INR: 1.37 (ref 0.00–1.49)
Prothrombin Time: 16.4 seconds — ABNORMAL HIGH (ref 11.6–15.2)

## 2015-07-30 LAB — GLUCOSE, CAPILLARY
GLUCOSE-CAPILLARY: 154 mg/dL — AB (ref 65–99)
GLUCOSE-CAPILLARY: 121 mg/dL — AB (ref 65–99)
Glucose-Capillary: 129 mg/dL — ABNORMAL HIGH (ref 65–99)
Glucose-Capillary: 198 mg/dL — ABNORMAL HIGH (ref 65–99)

## 2015-07-30 MED ORDER — CEFAZOLIN SODIUM-DEXTROSE 2-4 GM/100ML-% IV SOLN
2.0000 g | Freq: Once | INTRAVENOUS | Status: AC
  Administered 2015-07-30: 2 g via INTRAVENOUS
  Filled 2015-07-30 (×2): qty 100

## 2015-07-30 MED ORDER — MIDAZOLAM HCL 2 MG/2ML IJ SOLN
INTRAMUSCULAR | Status: AC | PRN
  Administered 2015-07-30: 0.5 mg via INTRAVENOUS
  Administered 2015-07-30: 1 mg via INTRAVENOUS

## 2015-07-30 MED ORDER — MIDAZOLAM HCL 2 MG/2ML IJ SOLN
INTRAMUSCULAR | Status: AC
  Filled 2015-07-30: qty 6

## 2015-07-30 MED ORDER — FENTANYL CITRATE (PF) 100 MCG/2ML IJ SOLN
INTRAMUSCULAR | Status: AC
  Filled 2015-07-30: qty 4

## 2015-07-30 MED ORDER — LIDOCAINE HCL 1 % IJ SOLN
INTRAMUSCULAR | Status: AC
  Filled 2015-07-30: qty 20

## 2015-07-30 MED ORDER — FENTANYL CITRATE (PF) 100 MCG/2ML IJ SOLN
INTRAMUSCULAR | Status: AC | PRN
  Administered 2015-07-30 (×2): 25 ug via INTRAVENOUS

## 2015-07-30 NOTE — Discharge Summary (Signed)
Discharge Summary  Steven Roberts JEH:[id number] DOB: Sep 26, 1965  PCP: [place] date: 07/27/2015 Discharge date: 07/30/2015   Recommendations for Outpatient Follow-up:  1. Hospice provider as needed.   Discharge Diagnoses:  Active Hospital Problems   Diagnosis Date Noted  . Dyspnea   . Metastatic lung cancer (metastasis from lung to other site) [facility])   . Encounter for palliative care   . Goals of care, counseling/discussion   . Acute respiratory failure ([place]) 07/27/2015    Resolved Hospital Problems   Diagnosis Date Noted Date Resolved  No resolved problems to display.    Discharge Condition: Stable   Diet recommendation: Regular, as tolerated   Filed Vitals:   07/30/15 1115 07/30/15 1123  BP: 130/79 125/74  Pulse: 119 119  Temp:    Resp: 19 19    History of present illness:  50M with advancing metastatic lung adenocarcinoma admitted for SOB, underwent thoracentesis by pulmonary critical care 6/13 who has elected for home with hospice. Was seen this AM, resting comfortably without acute complaint on 5 liters [place] oxygen. Discussed with son and wife as well.   Assessment/Plan: Active Problems:  Acute respiratory failure (HCC) - due to advancing cancer and recurrent pulmonary effusion.  - IR consulted, thoracentesis/Pleurex this AM - home with hospice now, discussed with family and pt this AM, all questions answered  Dyspnea  Metastatic lung cancer (metastasis from lung to other site) [facility]) - oncology has seen and recommends hospice  Encounter for palliative care - appreciate pall care involvement, pt and family have elected for home with hospice  Goals of care, counseling/discussion  Procedures:  Bedside thoracentesis 6/14  IR thoracentesis and Pleurex 6/16   Consultations:  Pulmonary  Palliative  IR   Discharge Exam: BP 125/74 mmHg  Pulse 119  Temp(Src) 97.8 F (36.6 C) (Oral)  Resp 19  Ht '5\' 7"'$  (1.702 m)  Wt 63.3 kg (139  lb 8.8 oz)  BMI 21.85 kg/m2  SpO2 100% General:  Alert, oriented, calm, in no acute distress, sitting upright on [place] O2, cachectic, tired appearing Eyes: pupils round and reactive to light and accomodation, clear sclerea Neck: supple, no masses, trachea mildline  Cardiovascular: RRR, no murmurs or rubs, no peripheral edema  Respiratory: clear to auscultation bilaterally, diminished on R Abdomen: soft, nontender, nondistended, normal bowel tones heard  Skin: dry, no rashes  Musculoskeletal: no joint effusions, normal range of motion  Psychiatric: appropriate affect, normal speech  Neurologic: extraocular muscles intact, clear speech, moving all extremities with intact sensorium    Discharge Instructions You were cared for by a hospitalist during your hospital stay. If you have any questions about your discharge medications or the care you received while you were in the hospital after you are discharged, you can call the unit and asked to speak with the hospitalist on call if the hospitalist that took care of you is not available. Once you are discharged, your primary care physician will handle any further medical issues. Please note that NO REFILLS for any discharge medications will be authorized once you are discharged, as it is imperative that you return to your primary care physician (or establish a relationship with a primary care physician if you do not have one) for your aftercare needs so that they can reassess your need for medications and monitor your lab values.  Discharge Instructions    Call MD for:    Complete by:  As directed   Call Hospice provider for any needs.  Diet general    Complete by:  As directed      Increase activity slowly    Complete by:  As directed             Medication List    STOP taking these medications        albuterol (2.5 MG/3ML) 0.083% nebulizer solution  Commonly known as:  PROVENTIL     benzonatate 200 MG capsule  Commonly known as:   TESSALON     dexamethasone 4 MG tablet  Commonly known as:  DECADRON     feeding supplement (ENSURE ENLIVE) Liqd     fentaNYL 50 MCG/HR  Commonly known as:  DURAGESIC - dosed mcg/hr     Fluticasone-Salmeterol 250-50 MCG/DOSE Aepb  Commonly known as:  ADVAIR     folic acid 1 MG tablet  Commonly known as:  FOLVITE     HYDROcodone-acetaminophen 5-325 MG tablet  Commonly known as:  NORCO/VICODIN     morphine 15 MG 12 hr tablet  Commonly known as:  MS CONTIN  Replaced by:  morphine CONCENTRATE 10 MG/0.5ML Soln concentrated solution     osimertinib mesylate 80 MG tablet  Commonly known as:  TAGRISSO     pantoprazole 40 MG tablet  Commonly known as:  PROTONIX      TAKE these medications        ADVIL 200 MG tablet  Generic drug:  ibuprofen  Take 400 mg by mouth every 6 (six) hours as needed for mild pain. Reported on 03/09/2015     glimepiride 2 MG tablet  Commonly known as:  AMARYL  Take 1 tablet (2 mg total) by mouth daily with breakfast.     HYDROcodone-homatropine 5-1.5 MG/5ML syrup  Commonly known as:  HYCODAN  Take 5 mLs by mouth every 4 (four) hours as needed for cough.     mometasone-formoterol 200-5 MCG/ACT Aero  Commonly known as:  DULERA  Inhale 2 puffs into the lungs 2 (two) times daily.     morphine CONCENTRATE 10 MG/0.5ML Soln concentrated solution  Take 0.25 mLs (5 mg total) by mouth every 2 (two) hours as needed for moderate pain, severe pain, anxiety or shortness of breath.     tiotropium 18 MCG inhalation capsule  Commonly known as:  SPIRIVA  Place 1 capsule (18 mcg total) into inhaler and inhale daily.       No Known Allergies    The results of significant diagnostics from this hospitalization (including imaging, microbiology, ancillary and laboratory) are listed below for reference.    Significant Diagnostic Studies: Dg Chest 1 View  07/01/2015  CLINICAL DATA:  Post right-sided thoracentesis, cough, history of lung carcinoma EXAM: CHEST 1  VIEW COMPARISON:  Chest x-ray of 07/01/2015, and CT chest of 05/08/2015 FINDINGS: After right thoracentesis much of the right pleural effusion has been evacuated. No pneumothorax is seen. A left hilar opacity remains and very prominent interstitial markings are noted throughout the lungs possibly due to lymphangitic spread of carcinoma. Mediastinal and hilar contours are unchanged. The heart is within normal limits in size. IMPRESSION: Decrease in volume of right pleural effusion after right thoracentesis with no pneumothorax. Electronically Signed   By: Ivar Drape M.D.   On: 07/01/2015 13:08   Dg Chest 2 View  07/27/2015  CLINICAL DATA:  History of lung carcinoma with increased shortness of breath EXAM: CHEST  2 VIEW COMPARISON:  Jul 01, 2015 FINDINGS: Right-sided pleural effusion which appears at least partially has increased in size.  There is extensive airspace consolidation throughout the lungs bilaterally with relative confluence in the left perihilar region, stable. There is no new airspace consolidation. Heart is upper normal in size. Pulmonary vascularity appears unremarkable. No adenopathy is evident by radiography. No bone lesions are evident by radiography. IMPRESSION: Widespread airspace consolidation bilaterally with confluence in the left perihilar region, stable. Increase in size of apparent partially loculated right pleural effusion anteriorly. Cardiac silhouette is overall stable. Electronically Signed   By: Lowella Grip III M.D.   On: 07/27/2015 09:33   Dg Chest 2 View  07/01/2015  CLINICAL DATA:  50 year old male history of left upper lobe carcinoma with radiation. EXAM: CHEST  2 VIEW COMPARISON:  05/11/2015, 05/31/2015, chest CT 05/08/2015 FINDINGS: Cardiomediastinal silhouette relatively unchanged though partially obscured by overlying lung and pleural disease. Over the course of recent chest x-rays there has been worsening of right-sided pleural effusion, now with pleural  parenchymal opacity obscuring the right hemidiaphragm, and dense opacity at the right base. Opacity of the right mid and lower lobe mixed interstitial and airspace opacity with thickening of the interlobular septa. Worsening opacity in the left central lung, with increasing interstitial and interlobular opacity of the lower lung. No pneumothorax. No large left-sided pleural effusion. 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 Worsening of right-sided pleural effusion, now moderate sized. Signed, Dulcy Fanny. Earleen Newport, DO [facility] Electronically Signed   By: Corrie Mckusick D.O.   On: 07/01/2015 10:03   Ct Head Wo Contrast  07/27/2015  CLINICAL DATA:  50 year old male with metastatic lung cancer. Fatigue, anorexia, pain, shortness of breath, cough. Osseous metastatic disease. EXAM: CT HEAD WITHOUT CONTRAST CT NECK WITH CONTRAST TECHNIQUE: Contiguous axial images were obtained from the base of the skull through the vertex without intravenous contrast Multidetector CT imaging of the and neck was performed using the standard protocol following the bolus administration of intravenous contrast. CONTRAST:  11m ISOVUE-300 IOPAMIDOL (ISOVUE-300) INJECTION 61% conjunction with contrast enhanced imaging of the chest, abdomen, and pelvis reported separately. The patient became progressively short of breath following the contrast-enhanced imaging from the neck to the pelvis. Rapid Response was called to the CT department as the patient's O2 level was 87%. He was taken to the ED. Subsequently post-contrast head CT imaging could not be obtained. COMPARISON:  Head CT without contrast 12/11/2014. Brain MRI 11/19/2014 Chest CT 05/08/2015. FINDINGS: CT HEAD FINDINGS Stable and well pneumatized  visualized paranasal sinuses and mastoids. No destructive or suspicious osseous lesion of the calvarium identified. Visualized orbits and scalp soft tissues are within normal limits. Cerebral volume is normal. No midline shift, ventriculomegaly, mass effect, evidence of mass lesion, intracranial hemorrhage or evidence of cortically based acute infarction. Gray-white matter differentiation is within normal limits throughout the brain. CT NECK FINDINGS Pharynx and larynx: Negative. Mild motion artifact. Negative parapharyngeal and retropharyngeal spaces. Salivary glands: Negative sublingual space, submandibular glands, and parotid glands. Thyroid: Stable and negative. Lymph nodes: Malignant appearing left level 4 lymph node conglomeration at the thoracic inlet appears mildly enlarged since 5 since 05/08/2015, currently encompassing 18 x 33 x 18 mm (AP by transverse by CC) versus 16 x 28 x 18 mm previously. Just superior to the main nodal conglomeration there are smaller but abnormally rounded individual nodes up to 9 mm short axis (series 8, image 73). These were not included on the  prior chest, and are increased compared to the 11/03/2014 PET-CT (5-6 mm at that time). Bilateral level 2 and level 3 lymph nodes also appear larger than in 2016 measuring 7-10 mm short axis bilaterally. A 10 mm right level IIIa node on series 8, image 52 corresponds to the level of a right lateral neck skin marker. No cystic or necrotic nodes in the neck. Vascular: Major vascular structures in the neck and at the skullbase are patent. No significant carotid bifurcation atherosclerosis. Limited intracranial: Negative. Visualized orbits: Negative. Mastoids and visualized paranasal sinuses: Clear. Skeleton: No acute or suspicious osseous lesion identified in the neck, although there is a 12 mm superior T1 vertebral body lytic lesion on the right which was sclerotic on 05/08/2015 (Series 8, image 74). Upper chest: Reported separately today.  IMPRESSION: 1. Left thoracic inlet nodal metastasis appears slightly larger than on 05/08/2015, although the differences might be related to better visualization today given dedicated Neck imaging. Still, smaller but increased nearby and bilateral level II/III cervical nodes compared to the 11/03/2014 PET-CT do suggest progression of neck nodal metastases. 2. No other metastatic disease identified in the neck. 3. T1 vertebral bone lesion has become lytic since 05/08/2015 suggesting progression of bone metastases. See also CT Chest Abdomen and Pelvis today reported separately. 4. Postcontrast head CT could not be performed. No evidence of metastatic disease to the brain on noncontrast head CT images. Electronically Signed   By: Genevie Ann M.D.   On: 07/27/2015 11:01   Ct Head W Contrast  07/27/2015  CLINICAL DATA:  50 year old male with metastatic lung cancer. Fatigue, anorexia, pain, shortness of breath, cough. Osseous metastatic disease. Unable to have post-contrast restaging CT images of the brain this morning due to shortness of breath and rapid response at that time. Presents following CTA chest at 1249 hours today for completion of post-contrast brain imaging. EXAM: CT HEAD WITH CONTRAST TECHNIQUE: Contiguous axial images were obtained from the base of the skull through the vertex with intravenous contrast. CONTRAST:  In conjunction with the CTA chest reported separately which utilized 80 mL Isovue 370 COMPARISON:  Noncontrast head CT 0819 hours today and earlier. FINDINGS: Stable and well pneumatized visualized paranasal sinuses and mastoids. No osseous abnormality identified. No midline shift, ventriculomegaly, mass effect, evidence of mass lesion, or evidence of cortically based acute infarction. Gray-white matter differentiation is within normal limits throughout the brain. No abnormal enhancement identified. Major intracranial vascular structures are enhancing. IMPRESSION: No metastatic disease to the  brain. Electronically Signed   By: Genevie Ann M.D.   On: 07/27/2015 13:23   Ct Soft Tissue Neck W Contrast  07/27/2015  CLINICAL DATA:  50 year old male with metastatic lung cancer. Fatigue, anorexia, pain, shortness of breath, cough. Osseous metastatic disease. See also Contrast section below. EXAM: CT HEAD WITHOUT CONTRAST CT NECK WITH CONTRAST TECHNIQUE: Contiguous axial images were obtained from the base of the skull through the vertex without intravenous contrast Multidetector CT imaging of the and neck was performed using the standard protocol following the bolus administration of intravenous contrast. CONTRAST:  177m ISOVUE-300 IOPAMIDOL (ISOVUE-300) INJECTION 61% in conjunction with contrast enhanced imaging of the chest, abdomen, and pelvis reported separately. The patient became progressively short of breath following the contrast-enhanced imaging from the neck to the pelvis. Rapid Response was called to the CT department as the patient's O2 level was 87%. He was taken to the ED. Subsequently post-contrast head CT imaging could not be obtained. COMPARISON:  Head CT without contrast  12/11/2014. Brain MRI 11/19/2014 Chest CT 05/08/2015. FINDINGS: CT HEAD FINDINGS Stable and well pneumatized visualized paranasal sinuses and mastoids. No destructive or suspicious osseous lesion of the calvarium identified. Visualized orbits and scalp soft tissues are within normal limits. Cerebral volume is normal. No midline shift, ventriculomegaly, mass effect, evidence of mass lesion, intracranial hemorrhage or evidence of cortically based acute infarction. Gray-white matter differentiation is within normal limits throughout the brain. CT NECK FINDINGS Pharynx and larynx: Negative. Mild motion artifact. Negative parapharyngeal and retropharyngeal spaces. Salivary glands: Negative sublingual space, submandibular glands, and parotid glands. Thyroid: Stable and negative. Lymph nodes: Malignant appearing left level 4 lymph  node conglomeration at the thoracic inlet appears mildly enlarged since 5 since 05/08/2015, currently encompassing 18 x 33 x 18 mm (AP by transverse by CC) versus 16 x 28 x 18 mm previously. Just superior to the main nodal conglomeration there are smaller but abnormally rounded individual nodes up to 9 mm short axis (series 8, image 73). These were not included on the prior chest, and are increased compared to the 11/03/2014 PET-CT (5-6 mm at that time). Bilateral level 2 and level 3 lymph nodes also appear larger than in 2016 measuring 7-10 mm short axis bilaterally. A 10 mm right level IIIa node on series 8, image 52 corresponds to the level of a right lateral neck skin marker. No cystic or necrotic nodes in the neck. Vascular: Major vascular structures in the neck and at the skullbase are patent. No significant carotid bifurcation atherosclerosis. Limited intracranial: Negative. Visualized orbits: Negative. Mastoids and visualized paranasal sinuses: Clear. Skeleton: No acute or suspicious osseous lesion identified in the neck, although there is a 12 mm superior T1 vertebral body lytic lesion on the right which was sclerotic on 05/08/2015 (Series 8, image 74). Upper chest: Reported separately today. IMPRESSION: 1. Left thoracic inlet nodal metastasis appears slightly larger than on 05/08/2015, although the differences might be related to better visualization today given dedicated Neck imaging. Still, smaller but increased nearby and bilateral level II/III cervical nodes compared to the 11/03/2014 PET-CT do suggest progression of neck nodal metastases. 2. No other metastatic disease identified in the neck. 3. T1 vertebral bone lesion has become lytic since 05/08/2015 suggesting progression of bone metastases. See also CT Chest Abdomen and Pelvis today reported separately. 4. Postcontrast head CT could not be performed. No evidence of metastatic disease to the brain on noncontrast head CT images. Electronically  Signed   By: Genevie Ann M.D.   On: 07/27/2015 09:35   Ct Chest W Contrast  07/27/2015  CLINICAL DATA:  Metastatic lung cancer. Osseous metastatic disease. Right pleural effusion. EXAM: CT CHEST, ABDOMEN, AND PELVIS WITH CONTRAST TECHNIQUE: Multidetector CT imaging of the chest, abdomen and pelvis was performed following the standard protocol during bolus administration of intravenous contrast. CONTRAST:  129m ISOVUE-300 IOPAMIDOL (ISOVUE-300) INJECTION 61% COMPARISON:  03/02/2015 FINDINGS: CT CHEST FINDINGS Mediastinum/Lymph Nodes: Normal heart size. New pericardial effusion identified. No axillary or supraclavicular adenopathy peer indistinct soft tissue stranding within the mediastinum is again identified without pathologically enlarged mediastinal lymph nodes. Lungs/Pleura: New large right pleural effusion which appears partially loculated. There is significantly diminished aeration to the right lung. Interval development of innumerable pulmonary nodular opacities throughout both lungs when compared with the most recent staging CT from 03/02/2015. Large perihilar confluent opacities are new from previous exam compatible with significant interval progression of disease. There is evidence of index perihilar opacity involving the left lung measures 11.3 x 5.1 cm,  image 68 of series 10. The index perihilar density within the right midlung measures 8.5 x 3.7 cm, image 69 of series 10. Interstitial thickening throughout the right upper lobe is new from previous exam and is worrisome for lymphangitic spread of tumor. Musculoskeletal: Multifocal mixed lytic and bone metastases are identified. New index lesion within the sternum measures 1.7 cm, image 85 of series 606. Progression of sclerotic bone metastasis is involving the T2 vertebra, image number 83 of series 606. New lytic lesion involving the superior endplate of D63 is identified, image 80 of series 606. CT ABDOMEN PELVIS FINDINGS Hepatobiliary: The index lesion  within the medial segment of left lobe of liver measures 1.9 x 1.6 cm, image number 56 of series 5. Previously 2.9 x 2.4 cm. Mild gallbladder wall edema. Nonspecific. Lesion within the lateral segment of left lobe measures 1.6 cm, image 55 of series 5. Unchanged from previous exam. Pancreas: No mass, inflammatory changes, or other significant abnormality. Spleen: The spleen is unremarkable. Adrenals/Urinary Tract: The adrenal glands are normal. Bilateral striated nephrograms are noted involving both kidneys. The urinary bladder is unremarkable. Stomach/Bowel: The stomach is normal. The small bowel loops have a normal course and caliber. Vascular/Lymphatic: Aortic atherosclerosis. Interval development of multiple prominent retroperitoneal lymph nodes. Index node in the periaortic region measures 1 cm, image 73 of series 5. 9 mm periaortic lymph node is also new, image number 70 of series 5. No enlarged pelvic or inguinal lymph nodes. Reproductive: No mass or other significant abnormality. Other: No ascites.  No peritoneal nodularity or mass identified. Musculoskeletal: New lytic lesion identified within the left iliac bone measures 11 mm, image 94 of series 5. Right iliac bone lytic lesion is also new measuring 3 cm, image 94 of series 5. Right iliac bone lytic lesion is new measuring 2.2 cm, image 100 of series 5. IMPRESSION: 1. Interval progression of disease. 2. New loculated right pleural effusion and new pericardial effusion. Suspect metastatic effusions. 3. Marked progression of pulmonary metastases. Suspect lymphangitic spread of tumor within the right upper lobe. 4. Interval progression of multifocal lytic and sclerotic bone metastases. 5. New upper abdominal retroperitoneal adenopathy 6. No significant change in the appearance of multifocal indeterminate liver lesions. 7. Striated nephrogram is noted involving both kidneys. This is a nonspecific finding but may be seen with vascular insult as well as a  pyelonephritis. Clinical correlation advise. Electronically Signed   By: Kerby Moors M.D.   On: 07/27/2015 09:45   Ct Angio Chest Pe W/cm &/or Wo Cm  07/27/2015  CLINICAL DATA:  History of metastatic lung carcinoma. Patient underwent CT imaging of head, neck, chest abdomen and pelvis this morning. Patient became very short of breath following the CT imaging this morning. EXAM: CT ANGIOGRAPHY CHEST WITH CONTRAST TECHNIQUE: Multidetector CT imaging of the chest was performed using the standard protocol during bolus administration of intravenous contrast. Multiplanar CT image reconstructions and MIPs were obtained to evaluate the vascular anatomy. CONTRAST:  100 mL of Isovue 370 intravenous contrast COMPARISON:  07/27/2015 FINDINGS: Angiographic study: There is no evidence of a pulmonary embolism. The great vessels normal in caliber. No aortic dissection plaque. Neck base and axilla: Prominent to mildly enlarged neck base lymph nodes, largest on the left measuring 12 mm short axis. Mediastinum and hila: Heart normal size. Fxs new small moderate pericardial effusion stable the earlier CT. Ill-defined lymph nodes, most prominent subcarinal region are stable from prior CT. Abnormal soft tissue surrounds both hila consistent confluent metastatic adenopathy.  Lungs and pleura: Numerous ill-defined pulmonary nodules, reticulonodular interstitial thickening, more confluent perihilar opacities and small to moderate right and minimal left pleural effusions are again noted consistent with extensive metastatic disease to the lungs including lymphangitic spread of carcinoma and probable malignant pleural effusions. This is stable from the earlier CT. Musculoskeletal: Mixed lytic and sclerotic bone lesions reflecting metastatic disease are again noted unchanged from the earlier CT. Review of the MIP images confirms the above findings. IMPRESSION: 1. No evidence of a pulmonary embolism. 2. No change from the CT scan obtained  earlier this same date. 3. Significant progression of metastatic disease lung carcinoma as detailed above and in the previous CT report. Electronically Signed   By: Lajean Manes M.D.   On: 07/27/2015 13:18   Ct Abdomen Pelvis W Contrast  07/27/2015  CLINICAL DATA:  Metastatic lung cancer. Osseous metastatic disease. Right pleural effusion. EXAM: CT CHEST, ABDOMEN, AND PELVIS WITH CONTRAST TECHNIQUE: Multidetector CT imaging of the chest, abdomen and pelvis was performed following the standard protocol during bolus administration of intravenous contrast. CONTRAST:  148m ISOVUE-300 IOPAMIDOL (ISOVUE-300) INJECTION 61% COMPARISON:  03/02/2015 FINDINGS: CT CHEST FINDINGS Mediastinum/Lymph Nodes: Normal heart size. New pericardial effusion identified. No axillary or supraclavicular adenopathy peer indistinct soft tissue stranding within the mediastinum is again identified without pathologically enlarged mediastinal lymph nodes. Lungs/Pleura: New large right pleural effusion which appears partially loculated. There is significantly diminished aeration to the right lung. Interval development of innumerable pulmonary nodular opacities throughout both lungs when compared with the most recent staging CT from 03/02/2015. Large perihilar confluent opacities are new from previous exam compatible with significant interval progression of disease. There is evidence of index perihilar opacity involving the left lung measures 11.3 x 5.1 cm, image 68 of series 10. The index perihilar density within the right midlung measures 8.5 x 3.7 cm, image 69 of series 10. Interstitial thickening throughout the right upper lobe is new from previous exam and is worrisome for lymphangitic spread of tumor. Musculoskeletal: Multifocal mixed lytic and bone metastases are identified. New index lesion within the sternum measures 1.7 cm, image 85 of series 606. Progression of sclerotic bone metastasis is involving the T2 vertebra, image number 83 of  series 606. New lytic lesion involving the superior endplate of TQ00is identified, image 80 of series 606. CT ABDOMEN PELVIS FINDINGS Hepatobiliary: The index lesion within the medial segment of left lobe of liver measures 1.9 x 1.6 cm, image number 56 of series 5. Previously 2.9 x 2.4 cm. Mild gallbladder wall edema. Nonspecific. Lesion within the lateral segment of left lobe measures 1.6 cm, image 55 of series 5. Unchanged from previous exam. Pancreas: No mass, inflammatory changes, or other significant abnormality. Spleen: The spleen is unremarkable. Adrenals/Urinary Tract: The adrenal glands are normal. Bilateral striated nephrograms are noted involving both kidneys. The urinary bladder is unremarkable. Stomach/Bowel: The stomach is normal. The small bowel loops have a normal course and caliber. Vascular/Lymphatic: Aortic atherosclerosis. Interval development of multiple prominent retroperitoneal lymph nodes. Index node in the periaortic region measures 1 cm, image 73 of series 5. 9 mm periaortic lymph node is also new, image number 70 of series 5. No enlarged pelvic or inguinal lymph nodes. Reproductive: No mass or other significant abnormality. Other: No ascites.  No peritoneal nodularity or mass identified. Musculoskeletal: New lytic lesion identified within the left iliac bone measures 11 mm, image 94 of series 5. Right iliac bone lytic lesion is also new measuring 3 cm, image 94  of series 5. Right iliac bone lytic lesion is new measuring 2.2 cm, image 100 of series 5. IMPRESSION: 1. Interval progression of disease. 2. New loculated right pleural effusion and new pericardial effusion. Suspect metastatic effusions. 3. Marked progression of pulmonary metastases. Suspect lymphangitic spread of tumor within the right upper lobe. 4. Interval progression of multifocal lytic and sclerotic bone metastases. 5. New upper abdominal retroperitoneal adenopathy 6. No significant change in the appearance of multifocal  indeterminate liver lesions. 7. Striated nephrogram is noted involving both kidneys. This is a nonspecific finding but may be seen with vascular insult as well as a pyelonephritis. Clinical correlation advise. Electronically Signed   By: Kerby Moors M.D.   On: 07/27/2015 09:45   Dg Chest Port 1 View  07/29/2015  CLINICAL DATA:  Respiratory failure. EXAM: PORTABLE CHEST 1 VIEW COMPARISON:  07/27/2015, 07/01/2015.  CT 07/27/2015, 03/30/2015. FINDINGS: Patient is rotated to the right. Mediastinum is stable. Persistent bilateral pulmonary opacities consistent with metastatic disease again noted. Cardiomegaly with progressive pulmonary interstitial prominence and progressive right pleural effusion. Tiny left pleural effusion again noted. A component congestive heart failure may be present. Bilateral pneumonia cannot be excluded. No pneumothorax. No acute bony abnormality . IMPRESSION: 1. Findings again noted consistent with diffuse pulmonary metastatic disease. 2. Noted on today's exam is cardiomegaly with increasing interstitial prominence and increasing right pleural effusion. Small left pleural effusion again noted. Findings suggest developing congestive heart failure. Electronically Signed   By: Marcello Moores  Register   On: 07/29/2015 07:29   Dg Chest Portable 1 View  07/27/2015  CLINICAL DATA:  Post thoracentesis.  Lung cancer EXAM: PORTABLE CHEST 1 VIEW COMPARISON:  07/27/2015 FINDINGS: Improvement in right pleural effusion. Small residual right effusion. Negative for pneumothorax Diffuse bilateral nodular densities post consistent with metastatic disease. This is slightly more prominent. Superimposed pneumonia or edema on the right cannot be excluded. IMPRESSION: Negative for pneumothorax post right thoracentesis Diffuse bilateral nodular densities compatible with metastatic disease. Progression of airspace disease on the right may represent pneumonia or edema. Electronically Signed   By: Franchot Gallo M.D.    On: 07/27/2015 12:05   US Thoracentesis Asp Pleural Space W/img Guide  07/01/2015  INDICATION: 50 year old male with history of lung cancer who has a recurrent right pleural effusion. Request has been made for diagnostic and therapeutic thoracentesis. EXAM: ULTRASOUND GUIDED DIAGNOSTIC AND THERAPEUTIC THORACENTESIS MEDICATIONS: 1% lidocaine COMPLICATIONS: None immediate. PROCEDURE: An ultrasound guided thoracentesis was thoroughly discussed with the patient and questions answered. The benefits, risks, alternatives and complications were also discussed. The patient understands and wishes to proceed with the procedure. Written consent was obtained. Ultrasound was performed to localize and mark an adequate pocket of fluid in the right chest. The area was then prepped and draped in the normal sterile fashion. 1% Lidocaine was used for local anesthesia. A Safe-T-Centesis catheter was introduced. Thoracentesis was performed. The catheter was removed and a dressing applied. FINDINGS: A total of approximately 1.25 L of clear yellow fluid was removed. Samples were sent to the laboratory as requested by the clinical team. IMPRESSION: Successful ultrasound guided right thoracentesis yielding 1.25 L of pleural fluid. Read by: Saverio Danker, PA-C Electronically Signed   By: Sandi Mariscal M.D.   On: 07/01/2015 12:52    Microbiology: Recent Results (from the past 240 hour(s))  Culture, blood (routine x 2)     Status: None (Preliminary result)   Collection Time: 07/27/15  9:16 AM  Result Value Ref Range Status  Specimen Description BLOOD LEFT FOREARM  Final   Special Requests BOTTLES DRAWN AEROBIC AND ANAEROBIC 5ML  Final   Culture   Final    NO GROWTH 2 DAYS Performed at [facility]    Report Status PENDING  Incomplete  Culture, blood (routine x 2)     Status: None (Preliminary result)   Collection Time: 07/27/15  9:42 AM  Result Value Ref Range Status   Specimen Description BLOOD RIGHT ANTECUBITAL   Final   Special Requests BOTTLES DRAWN AEROBIC AND ANAEROBIC 5ML  Final   Culture   Final    NO GROWTH 2 DAYS Performed at [facility]    Report Status PENDING  Incomplete  Body fluid culture     Status: None (Preliminary result)   Collection Time: 07/27/15 11:45 AM  Result Value Ref Range Status   Specimen Description PLEURAL RIGHT  Final   Special Requests Immunocompromised  Final   Gram Stain   Final    CYTOSPIN WBC PRESENT,BOTH PMN AND MONONUCLEAR NO ORGANISMS SEEN Gram Stain Report Called to,Read Back By and Verified With: M. HAMBY RN AT 2563 ON 06.13.17 BY SHUEA    Culture   Final    NO GROWTH 3 DAYS Performed at [facility]    Report Status PENDING  Incomplete  Urine culture     Status: Abnormal   Collection Time: 07/27/15 11:57 AM  Result Value Ref Range Status   Specimen Description URINE, CATHETERIZED  Final   Special Requests NONE  Final   Culture MULTIPLE SPECIES PRESENT, SUGGEST RECOLLECTION (A)  Final   Report Status 07/28/2015 FINAL  Final  MRSA PCR Screening     Status: None   Collection Time: 07/27/15  1:18 PM  Result Value Ref Range Status   MRSA by PCR NEGATIVE NEGATIVE Final    Comment:        The GeneXpert MRSA Assay (FDA approved for NASAL specimens only), is one component of a comprehensive MRSA colonization surveillance program. It is not intended to diagnose MRSA infection nor to guide or monitor treatment for MRSA infections.      Labs: Basic Metabolic Panel:  Recent Labs Lab 07/27/15 0905 07/28/15 0013 07/29/15 0408  NA 133* 137 135  K 4.3 4.2 4.2  CL 93* 96* 95*  CO2 34* 36* 37*  GLUCOSE 154* 156* 136*  BUN '20 18 16  '$ CREATININE 0.67 0.55* 0.48*  CALCIUM 8.9 8.8* 8.9   Liver Function Tests:  Recent Labs Lab 07/27/15 0905 07/27/15 1225  AST 24  --   ALT 20  --   ALKPHOS 134*  --   BILITOT 0.5  --   PROT 7.6 7.5  ALBUMIN 2.5*  --    No results for input(s): LIPASE, AMYLASE in the last 168 hours. No  results for input(s): AMMONIA in the last 168 hours. CBC:  Recent Labs Lab 07/27/15 0905 07/28/15 0013 07/29/15 0408  WBC 10.6* 7.6 14.5*  NEUTROABS 9.2*  --   --   HGB 9.6* 9.2* 9.2*  HCT 30.8* 29.9* 31.4*  MCV 79.8 82.6 84.6  PLT 496* 423* 452*   Cardiac Enzymes:  Recent Labs Lab 07/27/15 1225 07/27/15 1807 07/28/15 0013  TROPONINI 0.05* 0.05* 0.04*   BNP: BNP (last 3 results)  Recent Labs  07/27/15 0905 07/27/15 1225  BNP 27.8 29.2    ProBNP (last 3 results) No results for input(s): PROBNP in the last 8760 hours.  CBG:  Recent Labs Lab 07/29/15 2036  07/30/15 0001 07/30/15 0408 07/30/15 0812 07/30/15 1200  GLUCAP 221* 198* 154* 121* 129*    Time spent: 41 minutes were spent in preparing this discharge including medication reconciliation, counseling, and coordination of care.  Signed:  [employer]  [facility] 07/30/2015, 12:20 PM

## 2015-07-30 NOTE — Consult Note (Signed)
Chief Complaint: recurrent right malignant pleural effusion  Referring Physician:Dr. Marshell Garfinkel  Supervising Physician: Marybelle Killings  Patient Status: In-pt   HPI: Steven Roberts is an 50 y.o. male known to our service for prior thoracenteses.  He has stage IV lung cancer and was admitted this hospitalization for respiratory failure.  He had another thoracentesis done just 3 days ago with 1500cc removed.  He is now going to be discharged with hospice care and a request for a PleurX catheter has been made given how quickly his pleural effusion has recurred.    Past Medical History:  Past Medical History  Diagnosis Date  . Bone cancer ([place]) 11/03/14 Pet scan    left 6th rib, right hip  . Lung cancer ([place]) 10/23/14    LUL adenocarcinoma, non-small cell  . Pericardial effusion with cardiac tamponade 12/11/2014  . Pulmonary infiltrates  c/w Adenoca/ Stage IV  10/13/2014    - symptom onset march 2016 - Baseline cxr on July 20 2014 reported to be nl  > then CT chest 10/13/2014 LUL as dz  Assoc with   LUL nodular density/ mild adenopathy - Quantiferon Gold 10/13/2014 > neg  - FOB 10/23/14 > cobblestoning beyond Lingular orifice with diffuse narrowing x 50% of LUL beyond lingula> adenoca - PET 11/03/14 1. The dominant left upper lobe mass and extensive adenopathy involving th  . Non-small cell carcinoma of lung, stage 4 ([place]) 11/12/2014  . Hypoalbuminemia due to protein-calorie malnutrition ([place]) 12/02/2014  . Hyponatremia 12/10/2014  . Hyperphosphatemia 12/02/2014  . Bone metastasis ([place]) 11/25/2014  . S/P radiation therapy 12/07/14-01/18/15    left lung/lateral rib /rt hip  . Diabetes mellitus without complication ([place])   . Encounter for antineoplastic chemotherapy 03/09/2015  . Pulmonary hypertension [facility]): Per 2 d echo 06/03/2015 06/03/2015    Past Surgical History:  Past Surgical History  Procedure Laterality Date  . Video bronchoscopy Bilateral 10/23/2014    Procedure: VIDEO BRONCHOSCOPY  WITHOUT FLUORO;  Surgeon: Tanda Rockers, MD;  Location: [facility] ENDOSCOPY;  Service: Cardiopulmonary;  Laterality: Bilateral;  . Subxyphoid pericardial window N/A 12/11/2014    Procedure: SUBXYPHOID PERICARDIAL WINDOW;  Surgeon: Rexene Alberts, MD;  Location: [place];  Service: Thoracic;  Laterality: N/A;  . Tee without cardioversion N/A 12/11/2014    Procedure: TRANSESOPHAGEAL ECHOCARDIOGRAM (TEE);  Surgeon: Rexene Alberts, MD;  Location[facility];  Service: Thoracic;  Laterality: N/A;    Family History:  Family History  Problem Relation Age of Onset  . Cancer Father     Prostate cancer  . Diabetes Mother     Social History:  reports that he has never smoked. He has never used smokeless tobacco. He reports that he does not drink alcohol or use illicit drugs.  Allergies: No Known Allergies  Medications:   Medication List    STOP taking these medications        albuterol (2.5 MG/3ML) 0.083% nebulizer solution  Commonly known as:  PROVENTIL     benzonatate 200 MG capsule  Commonly known as:  TESSALON     dexamethasone 4 MG tablet  Commonly known as:  DECADRON     feeding supplement (ENSURE ENLIVE) Liqd     fentaNYL 50 MCG/HR  Commonly known as:  DURAGESIC - dosed mcg/hr     Fluticasone-Salmeterol 250-50 MCG/DOSE Aepb  Commonly known as:  ADVAIR     folic acid 1 MG tablet  Commonly known as:  FOLVITE     HYDROcodone-acetaminophen 5-325 MG  tablet  Commonly known as:  NORCO/VICODIN     morphine 15 MG 12 hr tablet  Commonly known as:  MS CONTIN  Replaced by:  morphine CONCENTRATE 10 MG/0.5ML Soln concentrated solution     osimertinib mesylate 80 MG tablet  Commonly known as:  TAGRISSO     pantoprazole 40 MG tablet  Commonly known as:  PROTONIX      TAKE these medications        ADVIL 200 MG tablet  Generic drug:  ibuprofen  Take 400 mg by mouth every 6 (six) hours as needed for mild pain. Reported on 03/09/2015     glimepiride 2 MG tablet  Commonly known as:   AMARYL  Take 1 tablet (2 mg total) by mouth daily with breakfast.     HYDROcodone-homatropine 5-1.5 MG/5ML syrup  Commonly known as:  HYCODAN  Take 5 mLs by mouth every 4 (four) hours as needed for cough.     mometasone-formoterol 200-5 MCG/ACT Aero  Commonly known as:  DULERA  Inhale 2 puffs into the lungs 2 (two) times daily.     morphine CONCENTRATE 10 MG/0.5ML Soln concentrated solution  Take 0.25 mLs (5 mg total) by mouth every 2 (two) hours as needed for moderate pain, severe pain, anxiety or shortness of breath.     tiotropium 18 MCG inhalation capsule  Commonly known as:  SPIRIVA  Place 1 capsule (18 mcg total) into inhaler and inhale daily.        Please HPI for pertinent positives, otherwise complete 10 system ROS negative, except for fatigue, shortness of breath, lower extremity edema.  Mallampati Score: MD Evaluation Airway: WNL Heart: WNL Abdomen: WNL Chest/ Lungs: WNL ASA  Classification: 3 Mallampati/Airway Score: Two  Physical Exam: BP 122/76 mmHg  Pulse 109  Temp(Src) 97.8 F (36.6 C) (Oral)  Resp 18  Ht 5' 7"  (1.702 m)  Wt 139 lb 8.8 oz (63.3 kg)  BMI 21.85 kg/m2  SpO2 100% Body mass index is 21.85 kg/(m^2). General: pleasant, ill-appearing Hispanic male who is laying in bed with some mild increase WOB HEENT: head is normocephalic, atraumatic.  Sclera are noninjected.  PERRL.  Ears and nose without any masses or lesions.  Mouth is pink and moist Heart: regular rhythm, but tachy.  Normal s1,s2. No obvious murmurs, gallops, or rubs noted.  Palpable radial and pedal pulses bilaterally Lungs: minimal breath sounds noted on the right side.  Some air movement and minimal wheezing on the left.  On [place] 5L.  Some increase WOB, using accessory muscles to assist him. Abd: soft, NT, ND, +BS, no masses, hernias, or organomegaly MS: all 4 extremities are symmetrical with no cyanosis, clubbing, but has +2 pitting edema of his BLE Psych: A&Ox3 with an appropriate  affect.   Labs: Results for orders placed or performed during the hospital encounter of 07/27/15 (from the past 48 hour(s))  Glucose, capillary     Status: Abnormal   Collection Time: 07/28/15 11:59 AM  Result Value Ref Range   Glucose-Capillary 252 (H) 65 - 99 mg/dL  Glucose, capillary     Status: Abnormal   Collection Time: 07/28/15  4:51 PM  Result Value Ref Range   Glucose-Capillary 232 (H) 65 - 99 mg/dL   Comment 1 Notify RN    Comment 2 Document in Chart   Glucose, capillary     Status: Abnormal   Collection Time: 07/28/15  8:43 PM  Result Value Ref Range   Glucose-Capillary 224 (H) 65 - 99  mg/dL  Glucose, capillary     Status: Abnormal   Collection Time: 07/28/15 11:59 PM  Result Value Ref Range   Glucose-Capillary 178 (H) 65 - 99 mg/dL  CBC     Status: Abnormal   Collection Time: 07/29/15  4:08 AM  Result Value Ref Range   WBC 14.5 (H) 4.0 - 10.5 K/uL   RBC 3.71 (L) 4.22 - 5.81 MIL/uL   Hemoglobin 9.2 (L) 13.0 - 17.0 g/dL   HCT 31.4 (L) 39.0 - 52.0 %   MCV 84.6 78.0 - 100.0 fL   MCH 24.8 (L) 26.0 - 34.0 pg   MCHC 29.3 (L) 30.0 - 36.0 g/dL   RDW 15.2 11.5 - 15.5 %   Platelets 452 (H) 150 - 400 K/uL  Basic metabolic panel     Status: Abnormal   Collection Time: 07/29/15  4:08 AM  Result Value Ref Range   Sodium 135 135 - 145 mmol/L   Potassium 4.2 3.5 - 5.1 mmol/L   Chloride 95 (L) 101 - 111 mmol/L   CO2 37 (H) 22 - 32 mmol/L   Glucose, Bld 136 (H) 65 - 99 mg/dL   BUN 16 6 - 20 mg/dL   Creatinine, Ser 0.48 (L) 0.61 - 1.24 mg/dL   Calcium 8.9 8.9 - 10.3 mg/dL   GFR calc non Af Amer >60 >60 mL/min   GFR calc Af Amer >60 >60 mL/min    Comment: (NOTE) The eGFR has been calculated using the CKD EPI equation. This calculation has not been validated in all clinical situations. eGFR's persistently <60 mL/min signify possible Chronic Kidney Disease.    Anion gap 3 (L) 5 - 15  Glucose, capillary     Status: Abnormal   Collection Time: 07/29/15  4:24 AM  Result  Value Ref Range   Glucose-Capillary 130 (H) 65 - 99 mg/dL  Glucose, capillary     Status: Abnormal   Collection Time: 07/29/15  7:33 AM  Result Value Ref Range   Glucose-Capillary 171 (H) 65 - 99 mg/dL  Glucose, capillary     Status: Abnormal   Collection Time: 07/29/15 12:10 PM  Result Value Ref Range   Glucose-Capillary 174 (H) 65 - 99 mg/dL  Glucose, capillary     Status: Abnormal   Collection Time: 07/29/15  4:57 PM  Result Value Ref Range   Glucose-Capillary 119 (H) 65 - 99 mg/dL  Glucose, capillary     Status: Abnormal   Collection Time: 07/29/15  8:36 PM  Result Value Ref Range   Glucose-Capillary 221 (H) 65 - 99 mg/dL   Comment 1 Notify RN   Glucose, capillary     Status: Abnormal   Collection Time: 07/30/15 12:01 AM  Result Value Ref Range   Glucose-Capillary 198 (H) 65 - 99 mg/dL   Comment 1 Notify RN   Glucose, capillary     Status: Abnormal   Collection Time: 07/30/15  4:08 AM  Result Value Ref Range   Glucose-Capillary 154 (H) 65 - 99 mg/dL  Glucose, capillary     Status: Abnormal   Collection Time: 07/30/15  8:12 AM  Result Value Ref Range   Glucose-Capillary 121 (H) 65 - 99 mg/dL   Comment 1 Notify RN    Comment 2 Document in Chart   Protime-INR     Status: Abnormal   Collection Time: 07/30/15  8:58 AM  Result Value Ref Range   Prothrombin Time 16.4 (H) 11.6 - 15.2 seconds   INR 1.37 0.00 -  1.49    Imaging: Dg Chest Port 1 View  07/29/2015  CLINICAL DATA:  Respiratory failure. EXAM: PORTABLE CHEST 1 VIEW COMPARISON:  07/27/2015, 07/01/2015.  CT 07/27/2015, 03/30/2015. FINDINGS: Patient is rotated to the right. Mediastinum is stable. Persistent bilateral pulmonary opacities consistent with metastatic disease again noted. Cardiomegaly with progressive pulmonary interstitial prominence and progressive right pleural effusion. Tiny left pleural effusion again noted. A component congestive heart failure may be present. Bilateral pneumonia cannot be excluded. No  pneumothorax. No acute bony abnormality . IMPRESSION: 1. Findings again noted consistent with diffuse pulmonary metastatic disease. 2. Noted on today's exam is cardiomegaly with increasing interstitial prominence and increasing right pleural effusion. Small left pleural effusion again noted. Findings suggest developing congestive heart failure. Electronically Signed   By: Marcello Moores  Register   On: 07/29/2015 07:29    Assessment/Plan 1. 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 Barbie Banner.  -PT/INR is normal -we will plan to proceed with a PleurX catheter placement today as the patient will likely be discharged home later today with hospice care. -he has been NPO and not on any blood thinners. -Risks and Benefits discussed with the patient including, but not limited to bleeding, infection, injury to adjacent structures. All of the patient's questions were answered, patient is agreeable to proceed. Consent signed and in chart.    Thank you for this interesting consult.  I greatly enjoyed meeting Nivan Melendrez and look forward to participating in their care.  A copy of this report was sent to the requesting provider on this date.  Electronically Signed: Henreitta Cea 07/30/2015, 9:39 AM   I spent a total of 30 minutes    in face to face in clinical consultation, greater than 50% of which was counseling/coordinating care for recurrent right malignant pleural effusion

## 2015-07-30 NOTE — Care Management Note (Signed)
Case Management Note  Patient Details  Name: Steven Roberts MRN: [id number] Date of Birth: [birth year]  Subjective/Objective:  Spoke to spouse about d/c plans-home w/[facility]-they have accepted case-family already has home 02-[facility], & travel tank.They will mange their own transportation. [facility]-spoke to [facility] tel#[phone], they have all info needed, they also have drainage cannisters for pleurx cath if needed.  Nurse contacted IR here in hospital & asked about pleurx cath-cannisters-they said they did not use any so therefore none brought up to the floor.[facility] has their own cannisters to drain pleurx cath if needed. Provided family with [facility] tel # [phone] to call once they are home so home nurse can come to house by 2:30p-family voiced understanding.Offered [facility] again-family declined, they said they will manage with their own transportation.Nsg updated. No further CM needs.                   Action/Plan:d/c home w/Hospice.   Expected Discharge Date:   (unknown)               Expected Discharge Plan:  Home w Hospice Care  In-House Referral:     Discharge planning Services  CM Consult  Post Acute Care Choice:    Choice offered to:  Adult Children, Patient, Spouse  DME Arranged:    DME Agency:     HH Arranged:  Disease Management [place] Agency:  [place], Other - See comment  Status of Service:  Completed, signed off  Medicare Important Message Given:    Date Medicare IM Given:    Medicare IM give by:    Date Additional Medicare IM Given:    Additional Medicare Important Message give by:     If discussed at [place] of Stay Meetings, dates discussed:    Additional Comments:  Dessa Phi, RN 07/30/2015, 1:15 PM

## 2015-07-30 NOTE — Progress Notes (Signed)
NUTRITION NOTE  Saw pt on 6/14. Pt has been NPO since yesterday AM. Per MD note yesterday, plan to d/c home with hospice this AM. If pt does not d/c and if diet advances, will follow-up with pt at that time.   [name], MS, RD, LDN Inpatient Clinical Dietitian Pager # [phone] After hours/weekend pager # [phone]

## 2015-07-30 NOTE — Procedures (Signed)
R Pleurx drain R thora 1000 cc No comp/EBL

## 2015-07-30 NOTE — Telephone Encounter (Signed)
Pt d/c today from [facility] to hospice in the home.

## 2015-07-31 LAB — BODY FLUID CULTURE: Culture: NO GROWTH

## 2015-08-01 LAB — CULTURE, BLOOD (ROUTINE X 2)
Culture: NO GROWTH
Culture: NO GROWTH

## 2015-08-02 ENCOUNTER — Telehealth: Payer: Self-pay | Admitting: Internal Medicine

## 2015-08-02 ENCOUNTER — Other Ambulatory Visit: Payer: Self-pay | Admitting: Medical Oncology

## 2015-08-02 NOTE — Telephone Encounter (Signed)
Pt in hospice, cancelled apt per diane, no need to resched per diane

## 2015-08-04 ENCOUNTER — Telehealth: Payer: Self-pay | Admitting: Medical Oncology

## 2015-08-05 ENCOUNTER — Telehealth: Payer: Self-pay | Admitting: Medical Oncology

## 2015-08-05 NOTE — Telephone Encounter (Signed)
Copy of signed death certificate faxed .

## 2015-08-09 ENCOUNTER — Other Ambulatory Visit: Payer: Self-pay

## 2015-08-09 ENCOUNTER — Telehealth: Payer: Self-pay | Admitting: Internal Medicine

## 2015-08-09 ENCOUNTER — Ambulatory Visit: Payer: Self-pay | Admitting: Internal Medicine

## 2015-08-09 NOTE — Telephone Encounter (Signed)
HIM informed by desk nurse of patient's death. Patient status changed to deceased.

## 2015-08-14 NOTE — Telephone Encounter (Signed)
Pt died today at 1217pm.

## 2015-08-14 DEATH — deceased

## 2017-04-02 IMAGING — MR MR HEAD WO/W CM
10 of 13 series · 33 of 48 positions shown · IV contrast (multihance)
Comparison: None.

CLINICAL DATA: Stage IV non-small cell lung cancer. Left-sided
headaches for 3 months.

EXAM:
MRI HEAD WITHOUT AND WITH CONTRAST
TECHNIQUE: Multiplanar, multiecho pulse sequences of the brain and surrounding
structures were obtained without and with intravenous contrast.
CONTRAST:  17 mL MultiHance.

[Series 3: T1 · sagittal · 5.0mm · 0.47mm/px · 2 of 24 slices shown]
[im 1/24]
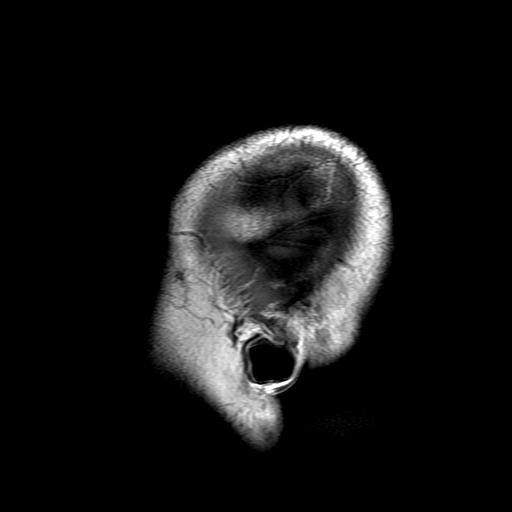
[im 12/24]
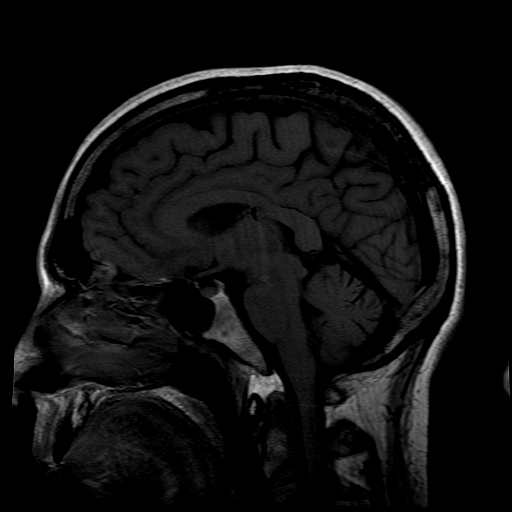

[Series 4: DWI · axial · 3.0mm · 1.09mm/px · z∈[-46,+101]mm · 8 of 100 slices shown (1 of 4)]
[im 1/100]
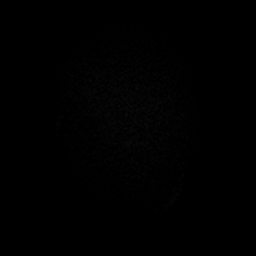
[im 12/100]
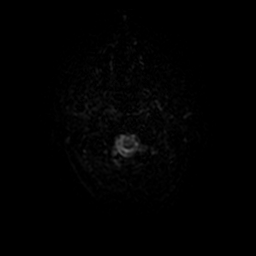
[im 34/100]
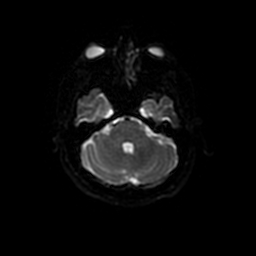
[im 45/100]
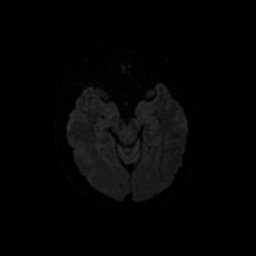
[im 56/100]
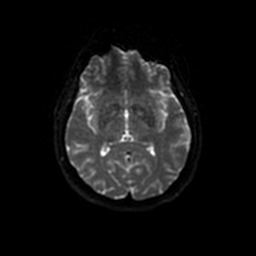
[im 67/100]
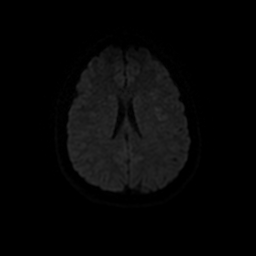
[im 89/100]
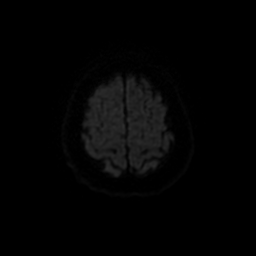
[im 100/100]
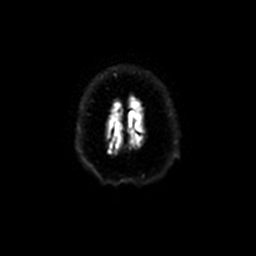

[Series 5: DWI · coronal · 5.0mm · 1.09mm/px · 6 of 68 slices shown (2 of 4)]
[im 1/68]
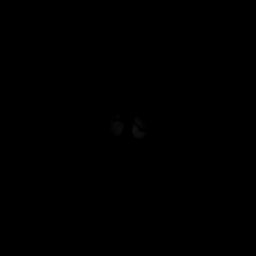
[im 14/68]
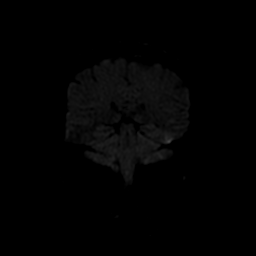
[im 27/68]
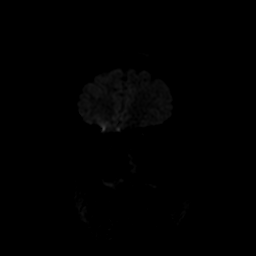
[im 41/68]
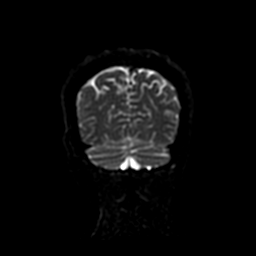
[im 54/68]
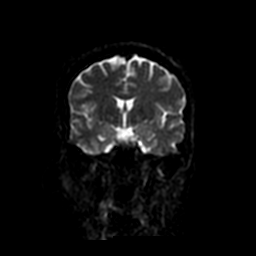
[im 68/68]
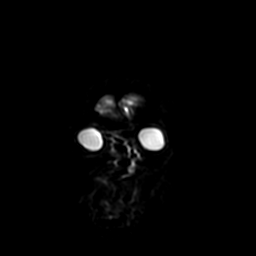

[Series 6: T2 · axial · 5.0mm · 0.43mm/px · z∈[-31,+117]mm · 2 of 24 slices shown]
[im 1/24]
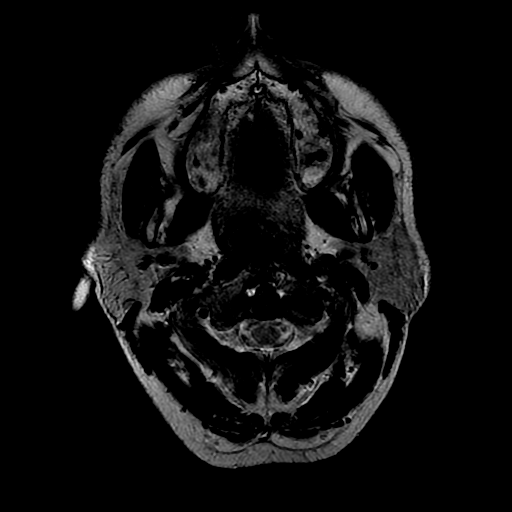
[im 24/24]
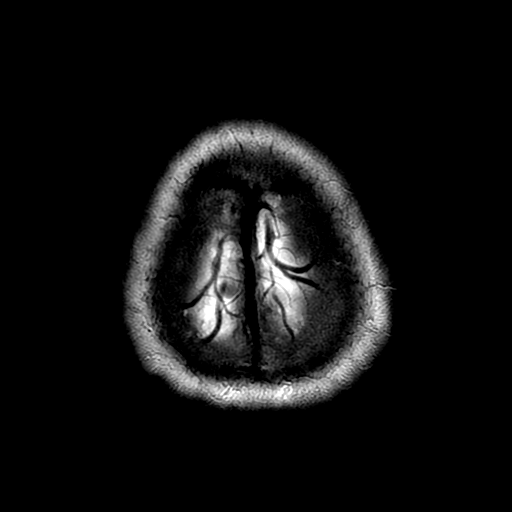

[Series 7: FLAIR · axial · 5.0mm · 0.43mm/px · z∈[-37,+122]mm · 2 of 24 slices shown]
[im 1/24]
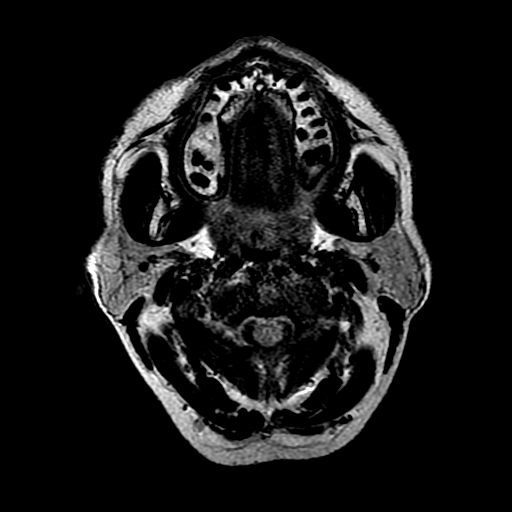
[im 24/24]
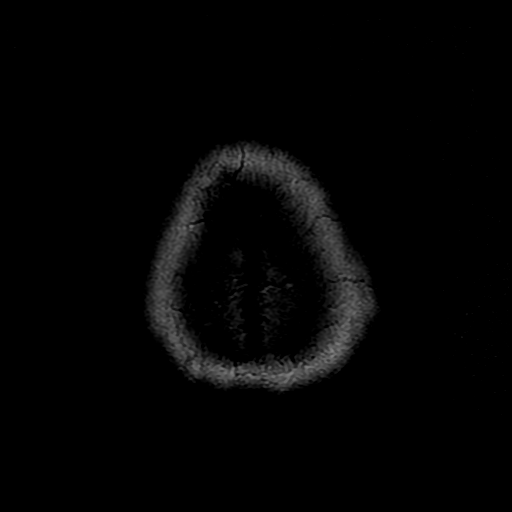

[Series 10: T2 post-contrast · coronal · 5.0mm · 0.47mm/px · 2 of 24 slices shown]
[im 1/24]
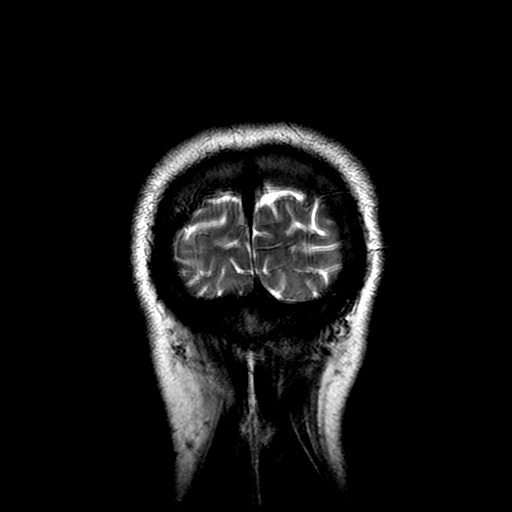
[im 24/24]
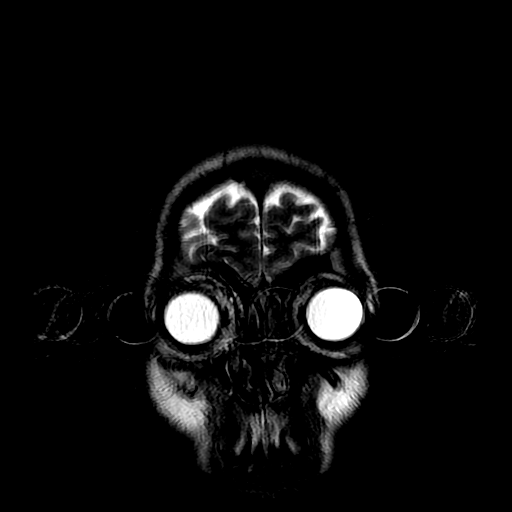

[Series 12: T1 post-contrast · coronal · 5.0mm · 0.47mm/px · 2 of 24 slices shown (1 of 2)]
[im 1/24]
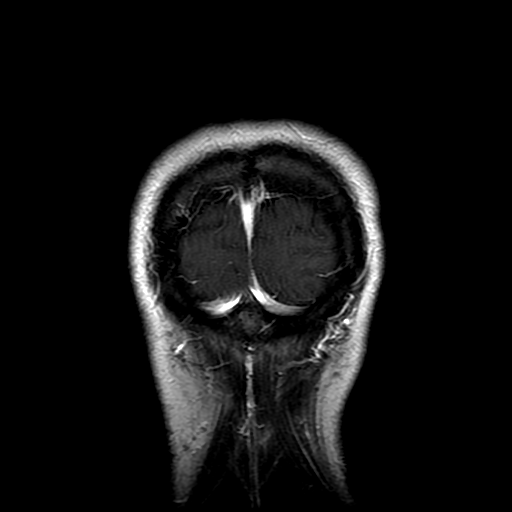
[im 24/24]
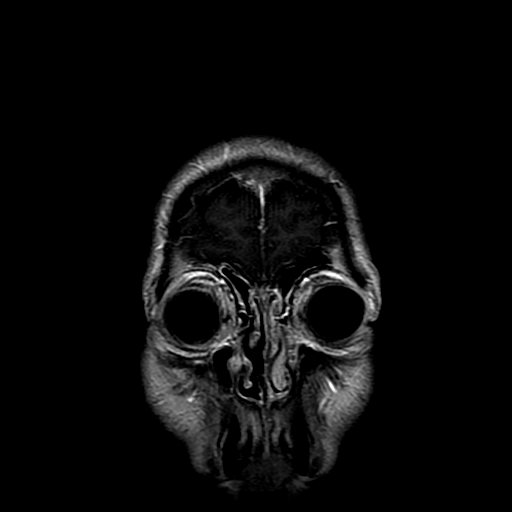

[Series 13: T1 post-contrast · sagittal · 5.0mm · 0.47mm/px · 2 of 24 slices shown (2 of 2)]
[im 1/24]
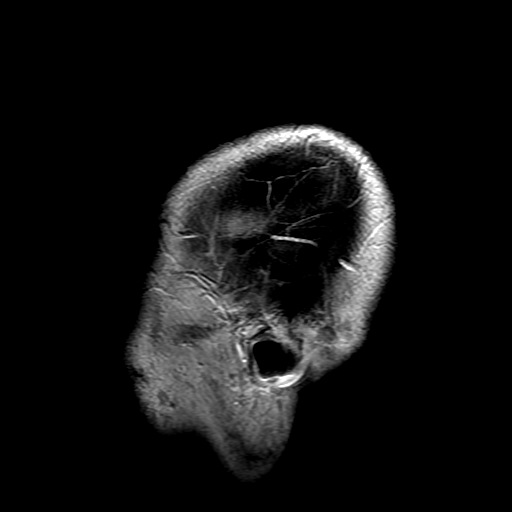
[im 24/24]
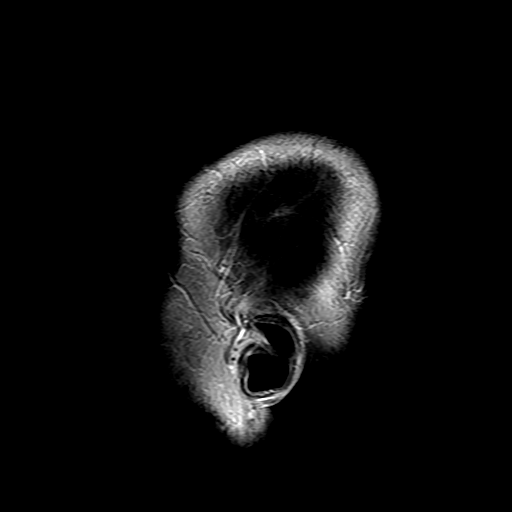

[Series 400: DWI · axial · 3.0mm · 1.09mm/px · z∈[-46,+101]mm · 4 of 50 slices shown (3 of 4)]
[im 1/50]
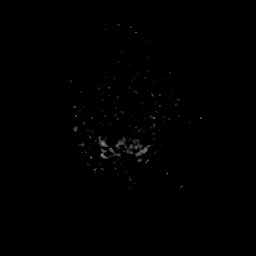
[im 17/50]
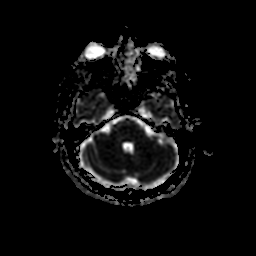
[im 33/50]
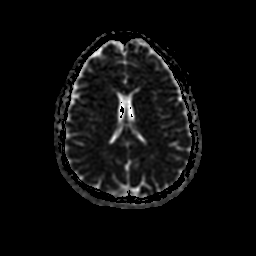
[im 50/50]
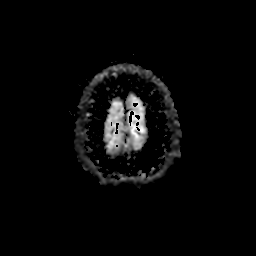

[Series 500: DWI · coronal · 5.0mm · 1.09mm/px · 3 of 34 slices shown (4 of 4)]
[im 1/34]
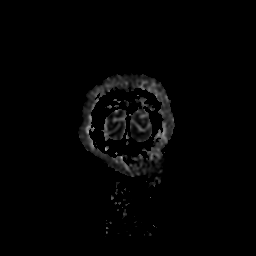
[im 17/34]
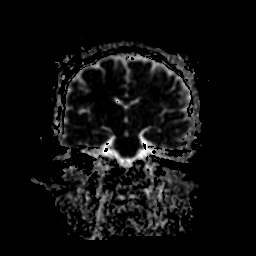
[im 34/34]
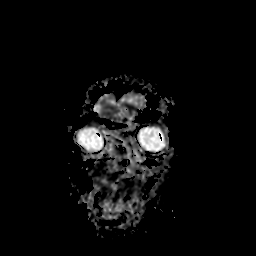

[33 of 48 positions shown; findings below may reference images not displayed]

FINDINGS: No acute infarct, hemorrhage, or mass lesion is present. The
ventricles are of normal size. No significant extraaxial fluid
collection is present.

No significant white matter changes are present.

The internal auditory canals and brainstem are normal. Flow is
present in the major intracranial arteries.

The globes orbits are intact. Mild mucosal thickening is present in
the maxillary sinuses and ethmoid air cells bilaterally. There is
minimal mucosal thickening in the right frontal sinus. The sphenoid
sinuses and mastoid air cells are clear.

Skullbase is within normal limits.  Midline images are unremarkable.

Postcontrast images demonstrate no pathologic enhancement to suggest
metastatic disease the brain or meninges. Linear enhancement in the
right temporal lobe is compatible with a developmental venous
anomaly.
IMPRESSION: 1. No evidence for metastatic disease to the brain or meninges.
2. Benign developmental venous anomaly within the right temporal
lobe.
3. Minimal sinus disease.
